# Patient Record
Sex: Female | Born: 1960
Health system: Southern US, Community
[De-identification: ages and names within clinical notes are randomized; demographics above are authoritative.]

## PROBLEM LIST (undated history)

## (undated) DIAGNOSIS — L03119 Cellulitis of unspecified part of limb: Secondary | ICD-10-CM

## (undated) DIAGNOSIS — D649 Anemia, unspecified: Secondary | ICD-10-CM

## (undated) DIAGNOSIS — L02619 Cutaneous abscess of unspecified foot: Secondary | ICD-10-CM

## (undated) DIAGNOSIS — E1151 Type 2 diabetes mellitus with diabetic peripheral angiopathy without gangrene: Secondary | ICD-10-CM

## (undated) DIAGNOSIS — T7840XA Allergy, unspecified, initial encounter: Secondary | ICD-10-CM

## (undated) DIAGNOSIS — I10 Essential (primary) hypertension: Secondary | ICD-10-CM

## (undated) DIAGNOSIS — I739 Peripheral vascular disease, unspecified: Secondary | ICD-10-CM

## (undated) DIAGNOSIS — F419 Anxiety disorder, unspecified: Secondary | ICD-10-CM

## (undated) DIAGNOSIS — R7611 Nonspecific reaction to tuberculin skin test without active tuberculosis: Secondary | ICD-10-CM

## (undated) HISTORY — DX: Anxiety disorder, unspecified: F41.9

## (undated) HISTORY — DX: Allergy, unspecified, initial encounter: T78.40XA

## (undated) HISTORY — DX: Anemia, unspecified: D64.9

## (undated) HISTORY — DX: Essential (primary) hypertension: I10

---

## 2009-11-16 ENCOUNTER — Encounter: Admission: RE | Admit: 2009-11-16 | Discharge: 2009-11-16 | Payer: Self-pay | Admitting: Infectious Diseases

## 2009-12-04 ENCOUNTER — Emergency Department (HOSPITAL_COMMUNITY): Admission: EM | Admit: 2009-12-04 | Discharge: 2009-12-04 | Payer: Self-pay | Admitting: Emergency Medicine

## 2009-12-09 ENCOUNTER — Emergency Department (HOSPITAL_COMMUNITY): Admission: EM | Admit: 2009-12-09 | Discharge: 2009-12-09 | Payer: Self-pay | Admitting: Emergency Medicine

## 2009-12-09 ENCOUNTER — Ambulatory Visit: Payer: Self-pay | Admitting: Family Medicine

## 2009-12-09 ENCOUNTER — Encounter: Payer: Self-pay | Admitting: Family Medicine

## 2009-12-09 DIAGNOSIS — I10 Essential (primary) hypertension: Secondary | ICD-10-CM

## 2009-12-09 DIAGNOSIS — R0789 Other chest pain: Secondary | ICD-10-CM | POA: Insufficient documentation

## 2010-01-08 ENCOUNTER — Emergency Department (HOSPITAL_COMMUNITY): Admission: EM | Admit: 2010-01-08 | Discharge: 2010-01-08 | Payer: Self-pay | Admitting: Emergency Medicine

## 2010-02-04 ENCOUNTER — Encounter
Admission: RE | Admit: 2010-02-04 | Discharge: 2010-02-04 | Payer: Self-pay | Source: Home / Self Care | Attending: Infectious Diseases | Admitting: Infectious Diseases

## 2010-03-23 NOTE — Assessment & Plan Note (Signed)
Summary: NP,tcb   Vital Signs:  Patient profile:   50 year old female Height:      61 inches Weight:      141.44 pounds BMI:     26.82 BSA:     1.63 Temp:     98.6 degrees F Pulse rate:   79 / minute BP sitting:   201 / 94  Vitals Entered By: Christen Bame CMA (December 09, 2009 2:52 PM) CC: NP Is Patient Diabetic? No Pain Assessment Patient in pain? no        CC:  NP.  History of Present Illness: this is a new pt.  she is arabic and presents with complaint of headache and chest pain.   pt has a history of htn previously treated with nifedipine, hctz, and aldomet.  however, she is a recent immigrant and has been off of her medication for the past 3 months.  bp today 200/98.  she also complains of a headache but denies any vision changes or paresthesias.  she also complains of midsternal nonradiating pressure like chest pain, which she states she has had for 2 years.  she states it is better with food.   Habits & Providers  Alcohol-Tobacco-Diet     Tobacco Status: never  Past History:  PSH neg.  FH Neg SHx: married, denies all All none OBHx: G3P2010-TSVD x2, neonatal demise at 23 wks, infant death at 75 mo., SAB x1  Past Medical History: ? DM per pt report Hypertension  Social History: Smoking Status:  never  Physical Exam  General:  Well-developed,well-nourished,in no acute distress; alert,appropriate and cooperative throughout examination Head:  Normocephalic and atraumatic without obvious abnormalities. No apparent alopecia or balding. Eyes:  No corneal or conjunctival inflammation noted. EOMI. Perrla. Funduscopic exam benign, without hemorrhages, exudates or papilledema. Vision grossly normal. Ears:  External ear exam shows no significant lesions or deformities.  Otoscopic examination reveals clear canals, tympanic membranes are intact bilaterally without bulging, retraction, inflammation or discharge. Hearing is grossly normal bilaterally. Neck:  No  deformities, masses, or tenderness noted. mild left carotid bruit Lungs:  Normal respiratory effort, chest expands symmetrically. Lungs are clear to auscultation, no crackles or wheezes. Heart:  Normal rate and regular rhythm. S1 and S2 normal without gallop, click, rub [id number] SEM RUSB Abdomen:  Bowel sounds positive,abdomen soft and non-tender without masses, organomegaly or hernias noted. no abdominal/renal bruits on auscultation Additional Exam:  EKG with inverted T waves V3-V6 and II and AVF.     Impression & Recommendations:  Problem # 1:  HYPERTENSION (ICD-401.9) Assessment Deteriorated  Orders: EKG- [place] (EKG) Pt with hypertensive emergency since with elevated Blood pressure and symptoms.  pt transported by EMS to ER for further evaluation.   Problem # 2:  CHEST PAIN UNSPECIFIED (ICD-786.50) Assessment: Deteriorated  Orders: ASA 325mg  tab [facility]) Due to the EKG changes and Hypertension.  pt given ASA and oxygen for possible ACS.  pt transported to emergency department for further evaluation and r/o MI.    Medication Administration  Medication # 1:    Medication: ASA 325mg  tab    Diagnosis: CHEST PAIN UNSPECIFIED (ICD-786.50)    Dose: 1 tablet    Route: po    Exp Date: 03/17/2010    Lot #: [id number]    Mfr: major    Patient tolerated medication without complications    Given by: Christen Bame CMA (December 09, 2009 4:18 PM)  Orders Added: 1)  EKG- [facility] [EKG] 2)  ASA 325mg  tab [EMRORAL]  3)  [facility]- [facility] [99204]     Medication Administration  Medication # 1:    Medication: ASA 325mg  tab    Diagnosis: CHEST PAIN UNSPECIFIED (ICD-786.50)    Dose: 1 tablet    Route: po    Exp Date: 03/17/2010    Lot #: [id number]    Mfr: major    Patient tolerated medication without complications    Given by: Christen Bame CMA (December 09, 2009 4:18 PM)  Orders Added: 1)  EKG- [facility] [EKG] 2)  ASA 325mg  tab [EMRORAL] 3)  [facility]- [facility] [99204]

## 2010-04-16 ENCOUNTER — Ambulatory Visit (INDEPENDENT_AMBULATORY_CARE_PROVIDER_SITE_OTHER): Payer: Medicaid Other

## 2010-04-16 ENCOUNTER — Inpatient Hospital Stay (INDEPENDENT_AMBULATORY_CARE_PROVIDER_SITE_OTHER)
Admission: RE | Admit: 2010-04-16 | Discharge: 2010-04-16 | Disposition: A | Discharge status: Home / Self Care | Payer: Medicaid Other | Source: Ambulatory Visit | Attending: Family Medicine | Admitting: Family Medicine

## 2010-04-16 DIAGNOSIS — M542 Cervicalgia: Secondary | ICD-10-CM

## 2010-05-04 ENCOUNTER — Emergency Department (HOSPITAL_COMMUNITY): Payer: Self-pay

## 2010-05-04 ENCOUNTER — Observation Stay (HOSPITAL_COMMUNITY)
Admission: EM | Admit: 2010-05-04 | Discharge: 2010-05-07 | DRG: 313 | Disposition: A | Discharge status: Home / Self Care | Payer: Self-pay | Attending: Family Medicine | Admitting: Family Medicine

## 2010-05-04 DIAGNOSIS — K219 Gastro-esophageal reflux disease without esophagitis: Secondary | ICD-10-CM | POA: Insufficient documentation

## 2010-05-04 DIAGNOSIS — Z8611 Personal history of tuberculosis: Secondary | ICD-10-CM | POA: Insufficient documentation

## 2010-05-04 DIAGNOSIS — R51 Headache: Secondary | ICD-10-CM | POA: Insufficient documentation

## 2010-05-04 DIAGNOSIS — R079 Chest pain, unspecified: Secondary | ICD-10-CM | POA: Insufficient documentation

## 2010-05-04 DIAGNOSIS — I10 Essential (primary) hypertension: Secondary | ICD-10-CM | POA: Insufficient documentation

## 2010-05-04 DIAGNOSIS — R61 Generalized hyperhidrosis: Secondary | ICD-10-CM | POA: Insufficient documentation

## 2010-05-04 DIAGNOSIS — E119 Type 2 diabetes mellitus without complications: Principal | ICD-10-CM | POA: Insufficient documentation

## 2010-05-04 LAB — CBC
HCT: 37.2 % (ref 36.0–46.0)
MCH: 26.7 pg (ref 26.0–34.0)
MCH: 27.6 pg (ref 26.0–34.0)
MCHC: 34.3 g/dL (ref 30.0–36.0)
MCHC: 33.8 g/dL (ref 30.0–36.0)
MCV: 81.6 fL (ref 78.0–100.0)
Platelets: 211 10*3/uL (ref 150–400)
RDW: 13 % (ref 11.5–15.5)
RDW: 13.5 % (ref 11.5–15.5)
WBC: 5.9 10*3/uL (ref 4.0–10.5)

## 2010-05-04 LAB — COMPREHENSIVE METABOLIC PANEL
ALT: 25 U/L (ref 0–35)
AST: 25 U/L (ref 0–37)
Albumin: 3.6 g/dL (ref 3.5–5.2)
Alkaline Phosphatase: 67 U/L (ref 39–117)
BUN: 8 mg/dL (ref 6–23)
CO2: 22 mEq/L (ref 19–32)
Calcium: 8.6 mg/dL (ref 8.4–10.5)
Chloride: 99 mEq/L (ref 96–112)
Creatinine, Ser: 0.68 mg/dL (ref 0.4–1.2)
Creatinine, Ser: 0.66 mg/dL (ref 0.4–1.2)
GFR calc non Af Amer: >60 mL/min (ref >60)
Glucose, Bld: 468 mg/dL — ABNORMAL HIGH (ref 70–99)
Glucose, Bld: 260 mg/dL — ABNORMAL HIGH (ref 70–99)
Potassium: 3.3 mEq/L — ABNORMAL LOW (ref 3.5–5.1)
Sodium: 133 mEq/L — ABNORMAL LOW (ref 135–145)
Total Bilirubin: 0.4 mg/dL (ref 0.3–1.2)
Total Protein: 7.7 g/dL (ref 6.0–8.3)

## 2010-05-04 LAB — URINE CULTURE
Colony Count: NO GROWTH
Culture  Setup Time: 201111180815

## 2010-05-04 LAB — URINALYSIS, ROUTINE W REFLEX MICROSCOPIC
Bilirubin Urine: NEGATIVE
Glucose, UA: 250 mg/dL — AB
Ketones, ur: NEGATIVE mg/dL
Protein, ur: NEGATIVE mg/dL
pH: 6 (ref 5.0–8.0)

## 2010-05-04 LAB — DIFFERENTIAL
Basophils Absolute: 0 10*3/uL (ref 0.0–0.1)
Basophils Absolute: 0 10*3/uL (ref 0.0–0.1)
Basophils Relative: 0 % (ref 0–1)
Basophils Relative: 0 % (ref 0–1)
Eosinophils Relative: 1 % (ref 0–5)
Lymphocytes Relative: 21 % (ref 12–46)
Monocytes Absolute: 0.4 10*3/uL (ref 0.1–1.0)
Monocytes Absolute: 0.3 10*3/uL (ref 0.1–1.0)
Monocytes Relative: 5 % (ref 3–12)
Monocytes Relative: 5 % (ref 3–12)
Neutro Abs: 4.2 10*3/uL (ref 1.7–7.7)
Neutrophils Relative %: 72 % (ref 43–77)

## 2010-05-04 LAB — POCT CARDIAC MARKERS
Myoglobin, poc: 56.1 ng/mL (ref 12–200)
Troponin i, poc: <0.05 ng/mL (ref 0.00–0.09)

## 2010-05-04 LAB — LIPASE, BLOOD
Lipase: 39 U/L (ref 11–59)
Lipase: 33 U/L (ref 11–59)

## 2010-05-04 LAB — URINE MICROSCOPIC-ADD ON

## 2010-05-05 ENCOUNTER — Encounter: Payer: Self-pay | Admitting: Family Medicine

## 2010-05-05 DIAGNOSIS — I10 Essential (primary) hypertension: Secondary | ICD-10-CM

## 2010-05-05 DIAGNOSIS — E118 Type 2 diabetes mellitus with unspecified complications: Secondary | ICD-10-CM

## 2010-05-05 LAB — DIFFERENTIAL
Basophils Absolute: 0 10*3/uL (ref 0.0–0.1)
Eosinophils Relative: 1 % (ref 0–5)
Lymphocytes Relative: 59 % — ABNORMAL HIGH (ref 12–46)
Lymphs Abs: 4.2 10*3/uL — ABNORMAL HIGH (ref 0.7–4.0)
Neutro Abs: 2.4 10*3/uL (ref 1.7–7.7)
Neutrophils Relative %: 34 % — ABNORMAL LOW (ref 43–77)

## 2010-05-05 LAB — POCT I-STAT, CHEM 8
BUN: 5 mg/dL — ABNORMAL LOW (ref 6–23)
Creatinine, Ser: 0.5 mg/dL (ref 0.4–1.2)
Glucose, Bld: 410 mg/dL — ABNORMAL HIGH (ref 70–99)
Hemoglobin: 13.6 g/dL (ref 12.0–15.0)
Sodium: 136 mEq/L (ref 135–145)
TCO2: 24 mmol/L (ref 0–100)

## 2010-05-05 LAB — GLUCOSE, CAPILLARY
Glucose-Capillary: 292 mg/dL — ABNORMAL HIGH (ref 70–99)
Glucose-Capillary: 284 mg/dL — ABNORMAL HIGH (ref 70–99)
Glucose-Capillary: 170 mg/dL — ABNORMAL HIGH (ref 70–99)
Glucose-Capillary: 188 mg/dL — ABNORMAL HIGH (ref 70–99)
Glucose-Capillary: 369 mg/dL — ABNORMAL HIGH (ref 70–99)

## 2010-05-05 LAB — POCT CARDIAC MARKERS
CKMB, poc: <1 ng/mL — ABNORMAL LOW (ref 1.0–8.0)
Myoglobin, poc: 47.5 ng/mL (ref 12–200)

## 2010-05-05 LAB — BASIC METABOLIC PANEL
BUN: 6 mg/dL (ref 6–23)
CO2: 25 mEq/L (ref 19–32)
Chloride: 102 mEq/L (ref 96–112)
GFR calc non Af Amer: >60 mL/min (ref >60)
Glucose, Bld: 223 mg/dL — ABNORMAL HIGH (ref 70–99)
Potassium: 3 mEq/L — ABNORMAL LOW (ref 3.5–5.1)
Sodium: 136 mEq/L (ref 135–145)

## 2010-05-05 LAB — CBC
HCT: 36.8 % (ref 36.0–46.0)
Hemoglobin: 12.1 g/dL (ref 12.0–15.0)
MCV: 78.3 fL (ref 78.0–100.0)
RBC: 4.7 MIL/uL (ref 3.87–5.11)
WBC: 7.1 10*3/uL (ref 4.0–10.5)

## 2010-05-05 LAB — CARDIAC PANEL(CRET KIN+CKTOT+MB+TROPI)
CK, MB: 2.7 ng/mL (ref 0.3–4.0)
Relative Index: 2 (ref 0.0–2.5)

## 2010-05-05 LAB — CK TOTAL AND CKMB (NOT AT ARMC): Relative Index: 2.2 (ref 0.0–2.5)

## 2010-05-05 LAB — TROPONIN I: Troponin I: 0.02 ng/mL (ref 0.00–0.06)

## 2010-05-05 NOTE — H&P (Signed)
[facility] Admission History and Physical  Patient name: Kimberly Lane Medical record number: [id number] Date of birth: 10-09-1960 Age: 50 y.o. Gender: female  Primary Care Provider: CAMPBELL,LACHELLE, MD  Chief Complaint: Chest pain, hyperglycemia History of Present Illness: Kimberly Lane is a 50 y.o. year old female presenting with h/a, dizziness, and "just not feeling well" x 1 day. Pt had bp checked at local community center and it was elevated.  Pt came to ER for evaluation for this reason.  Pt also reports Chest pain x 3 years that is worse with ambulation for long distances (such as 1/2 mile).  No sob with short distances.   Sometimes CP occurs after eating a meal.  The CP is located over the sternum.  It is worse with palpation of area.  Pt also endorses increased thirst.  No urinary or bowel complaints.  No fever.  No cold symptoms. All information obtained via arabic interpreter that is at bedside.   Patient Active Problem List  Diagnoses  . HYPERTENSION  . CHEST PAIN UNSPECIFIED   Past Medical History: HTN, TB  Past Surgical History: No past surgical history on file.  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  Pt speaks arabic. Husband works 3 hours away at [employer] so pt is alone often.  Pt denies tobacco, alcohol, or drug use  Family History: negative  Allergies: NKDA  Medications: Pt is taking vit B6. Unsure if pt has been treated for TB.  No current outpatient prescriptions on file.   Review Of Systems: Per HPI  Otherwise 12 point review of systems was performed and was unremarkable.  Physical Exam: Pulse: 69  Blood Pressure: 185/102 RR: 18   O2: 94% on RA Temp: 97.5  General: NAD, resting quietly HEENT: No lymphadenopathy, mild thyromegaly, some diffuse tenderness on palp of thyroid Heart: RRR, no murmur, rub or gallops, NSR on monitor, + tenderness  on palpation of central chest. Lungs: CTA bilateral, no wheezing bilateral Abdomen:soft, NT, ND, no rebound, no guarding Extremities: no edema, 2+ pedal pulses Skin: no rash Neurology: a/o x 3, following all commands  Labs and Imaging: Lab Results  Component Value Date/Time   NA 133* 05/04/2010  5:35 PM   K 3.8 05/04/2010  5:35 PM   CL 99 05/04/2010  5:35 PM   CO2 22 05/04/2010  5:35 PM   BUN 9 05/04/2010  5:35 PM   CREATININE 0.68 05/04/2010  5:35 PM   GLUCOSE 468* 05/04/2010  5:35 PM   Lab Results  Component Value Date   WBC 7.0 05/04/2010   HGB 13.2 05/04/2010   HCT 38.5 05/04/2010   MCV 77.8* 05/04/2010   PLT 244 05/04/2010   Lab Results  Component Value Date   CKTOTAL 140 05/05/2010   TROPONINI  Value: 0.02        NO INDICATION OF MYOCARDIAL INJURY. 05/05/2010    CXR-  No evidence of active pulmonary disease.   Assessment and Plan: Kimberly Lane is a 50 y.o. year old female presenting with Chest pain and hyperglycemia: 1. Chest pain- may be 2/2 to reflux.  Will treat with GI cocktail to see if symptoms improve.  Will also cycle cardiac enzymes and repeat ekg in am to r/o ischemic event that would cause chest pain. This is a chronic problem (x 3 years) so an acute mi is less likely.  May need to  be on PPI chronically to see if this helps symptoms. Not sure why pt has sob with walking 1/2 mile or greater--if all the above plan is normal, sob may be 2/2 deconditioning.  2. HTN- pt not taking any bp medications.  Discussed with pt that she most likely will always need to be taking a bp medicaton.  Started lisinopril since pt is new dx of diabetes and since pt Cr is wnl.  3. Hyperglycemia- most likely indicates a diagnosis of diabetes.  Will check A1C.  Will monitor CBG's.  Will cover with sliding scale insulin and will add scheduled insulin if there is a insulin requirement.  Pt will need to be set up with diabetes education as outpatient.  4. Thyromegaly- very subtle on exam.  Will order  TSH since enlarged and pt had diffuse tenderness in the area on exam.   5. FEN/GI: carb modified diet 6. Prophylaxis: heparin 5000 units TID 7. Disposition: pending clinical improvement.   Dictation # --[id number]

## 2010-05-06 LAB — CBC
HCT: 38.8 % (ref 36.0–46.0)
MCH: 26.7 pg (ref 26.0–34.0)
MCHC: 34.2 g/dL (ref 30.0–36.0)
MCV: 79 fL (ref 78.0–100.0)
Platelets: 221 10*3/uL (ref 150–400)
RDW: 12.7 % (ref 11.5–15.5)
RDW: 13.3 % (ref 11.5–15.5)
WBC: 7.1 10*3/uL (ref 4.0–10.5)
WBC: 5.9 10*3/uL (ref 4.0–10.5)

## 2010-05-06 LAB — GLUCOSE, CAPILLARY
Glucose-Capillary: 350 mg/dL — ABNORMAL HIGH (ref 70–99)
Glucose-Capillary: 267 mg/dL — ABNORMAL HIGH (ref 70–99)
Glucose-Capillary: 136 mg/dL — ABNORMAL HIGH (ref 70–99)

## 2010-05-06 LAB — BASIC METABOLIC PANEL
BUN: 5 mg/dL — ABNORMAL LOW (ref 6–23)
BUN: 6 mg/dL (ref 6–23)
CO2: 23 mEq/L (ref 19–32)
Calcium: 9.2 mg/dL (ref 8.4–10.5)
Chloride: 104 mEq/L (ref 96–112)
GFR calc non Af Amer: >60 mL/min (ref >60)
Glucose, Bld: 333 mg/dL — ABNORMAL HIGH (ref 70–99)
Glucose, Bld: 290 mg/dL — ABNORMAL HIGH (ref 70–99)
Potassium: 4 mEq/L (ref 3.5–5.1)

## 2010-05-06 LAB — URINALYSIS, ROUTINE W REFLEX MICROSCOPIC
Bilirubin Urine: NEGATIVE
Hgb urine dipstick: NEGATIVE
Ketones, ur: 15 mg/dL — AB
Nitrite: NEGATIVE
Protein, ur: NEGATIVE mg/dL
Urobilinogen, UA: 0.2 mg/dL (ref 0.0–1.0)

## 2010-05-07 LAB — CBC
HCT: 37.7 % (ref 36.0–46.0)
Hemoglobin: 12.5 g/dL (ref 12.0–15.0)
MCH: 26.1 pg (ref 26.0–34.0)
MCHC: 33.2 g/dL (ref 30.0–36.0)

## 2010-05-07 LAB — COMPREHENSIVE METABOLIC PANEL
BUN: 5 mg/dL — ABNORMAL LOW (ref 6–23)
CO2: 26 mEq/L (ref 19–32)
Chloride: 106 mEq/L (ref 96–112)
Creatinine, Ser: 0.62 mg/dL (ref 0.4–1.2)
GFR calc non Af Amer: >60 mL/min (ref >60)
Total Bilirubin: 0.4 mg/dL (ref 0.3–1.2)

## 2010-05-07 LAB — GLUCOSE, CAPILLARY
Glucose-Capillary: 178 mg/dL — ABNORMAL HIGH (ref 70–99)
Glucose-Capillary: 192 mg/dL — ABNORMAL HIGH (ref 70–99)

## 2010-05-17 ENCOUNTER — Encounter: Payer: Self-pay | Admitting: Family Medicine

## 2010-05-17 ENCOUNTER — Ambulatory Visit (INDEPENDENT_AMBULATORY_CARE_PROVIDER_SITE_OTHER): Payer: Self-pay | Admitting: Family Medicine

## 2010-05-17 DIAGNOSIS — Z23 Encounter for immunization: Secondary | ICD-10-CM

## 2010-05-17 DIAGNOSIS — E1165 Type 2 diabetes mellitus with hyperglycemia: Secondary | ICD-10-CM

## 2010-05-17 DIAGNOSIS — I10 Essential (primary) hypertension: Secondary | ICD-10-CM

## 2010-05-17 MED ORDER — PNEUMOCOCCAL VAC POLYVALENT 25 MCG/0.5ML IJ INJ: 0.5000 mL | INJECTION | Freq: Once | INTRAMUSCULAR | Status: DC

## 2010-05-17 MED ORDER — LISINOPRIL 20 MG PO TABS: 40.0000 mg | ORAL_TABLET | Freq: Every day | ORAL | Status: DC

## 2010-05-17 NOTE — Patient Instructions (Signed)
Diabetes Meal Planning Guide The diabetes meal planning guide is a tool to help you plan your meals and snacks. It is important for people with diabetes to manage their blood sugar levels. Choosing the right foods and the right amounts throughout your day will help control your blood sugar. Eating right can even help you improve your blood pressure and reach or maintain a healthy weight. CARBOHYDRATE COUNTING MADE EASY When you eat carbohydrates, they turn to sugar (glucose). This raises your blood sugar level. Counting carbohydrates can help you control this level so you feel better. When you plan your meals by counting carbohydrates, you can have more flexibility in what you eat and balance your medicine with your food intake. Carbohydrate counting simply means adding up the total amount of carbohydrate grams (g) in your meals or snacks. Try to eat about the same amount at each meal. Foods with carbohydrates are listed below. Each portion below is 1 carbohydrate serving or 15 grams of carbohydrates. Ask your dietician how many grams of carbohydrates you should eat at each meal or snack. Grains and Starches 1 slice bread 1/2 English muffin or hotdog/hamburger bun 3/4 cup cold cereal (unsweetened) 1/3 cup cooked pasta or rice 1/2 cup starchy vegetables (corn, potatoes, peas, beans, winter squash) 1 tortilla (6 inches) 1/4 bagel 1 waffle or pancake (size of a CD) 1/2 cup cooked cereal 4 to 6 small crackers *Whole grain is recommended Fruit 1 cup fresh unsweetened berries, melon, papaya, pineapple 1 small fresh fruit 1/2 banana or mango 1/2 cup fruit juice (4 ounces unsweetened) 1/2 cup canned fruit in natural juice or water 2 tablespoons dried fruit 12 to 15 grapes or cherries Milk and Yogurt 1 cup fat-free or 1% milk 1 cup soy milk 6 ounces light yogurt with sugar-free sweetener 6 ounces low-fat soy yogurt 6 ounces plain yogurt Vegetables 1 cup raw or 1/2 cup cooked is counted as 0  carbohydrates or a "free" food. If you eat 3 or more servings at one meal, count them as 1 carbohydrate serving. Other Carbohydrates 3/4 ounces chips or pretzels 1/2 cup ice cream or frozen yogurt 1/4 cup sherbet or sorbet 2 inch square cake, no frosting 1 tablespoon honey, sugar, jam, jelly, or syrup 2 small cookies 3 squares of graham crackers 3 cups popcorn 6 crackers 1 cup broth-based soup Count 1 cup casserole or other mixed foods as 2 carbohydrate servings. Foods with less than 20 calories in a serving may be counted as 0 carbohydrates or a "free" food. You may want to purchase a book or computer software that lists the carbohydrate gram counts of different foods. In addition, the nutrition facts panel on the labels of the foods you eat are a good source of this information. The label will tell you how big the serving size is and the total number of carbohydrate grams you will be eating per serving. Divide this number by 15 to obtain the number of carbohydrate servings in a portion. Remember: 1 carbohydrate serving equals 15 grams of carbohydrate. SERVING SIZES Measuring foods and serving sizes helps you make sure you are getting the right amount of food. The list below tells how big or small some common serving sizes are.  1 ounce (oz) of cheese.................................4 stacked dice.   2 to 3 oz cooked meat..................................Deck of cards.   1 teaspoon (tsp)............................................Tip of little finger.   1 tablespoon (tbs).........................................Thumb.   2 tbs.............................................................Golf ball.    cup...........................................................Half of a fist.   1 cup............................................................A fist.    SAMPLE DIABETES MEAL PLAN Below is a sample meal plan that includes foods from the grain and starches, dairy, vegetable, fruit, and  meat groups. A dietician can individualize a meal plan to fit your calorie needs and tell you the number of servings needed from each food group. However, controlling the total amount of carbohydrates in your meal or snack is more important than making sure you include all of the food groups at every meal. You may interchange carbohydrate containing foods (dairy, starches, and fruits). The meal plan below is an example of a 2000 calorie diet using carbohydrate counting. This meal plan has 17 carbohydrate servings (carb choices). Breakfast 1 cup oatmeal (2 carb choices) 3/4 cup light yogurt (1 carb choice) 1 cup blueberries (1 carb choice) 1/4 cup almonds  Snack 1 large apple (2 carb choices) 1 low-fat string cheese stick  Lunch Chicken breast salad:  1 cup spinach   1/4 cup chopped tomatoes   2 oz chicken breast, sliced   2 tbs low-fat [place] dressing  12 whole-wheat crackers (2 carb choices) 12 to 15 grapes (1 carb choice) 1 cup low-fat milk (1 carb choice)  Snack 1 cup carrots 1/2 cup hummus (1 carb choice)  Dinner 3 oz broiled salmon 1 cup brown rice (3 carb choices)  Snack 1 1/2 cups steamed broccoli (1 carb choice) drizzled with 1 tsp olive oil and lemon juice 1 cup light pudding (2 carb choices)  DIABETES MEAL PLANNING WORKSHEET Your dietician can use this worksheet to help you decide how many servings of foods and what types of foods are right for you.  Breakfast Food Group and Servings Carb Choices Grain/Starches _______________________________________ Dairy ______________________________________________ Vegetable _______________________________________ Fruit _______________________________________________ Meat _______________________________________________ Fat _____________________________________________ Lunch Food Group and Servings Carb Choices Grain/Starches ________________________________________ Dairy _______________________________________________ Fruit  ________________________________________________ Meat ________________________________________________ Fat _____________________________________________ Dinner Food Group and Servings Carb Choices Grain/Starches ________________________________________ Dairy _______________________________________________ Fruit ________________________________________________ Meat ________________________________________________ Fat _____________________________________________ Snacks Food Group and Servings Carb Choices Grain/Starches ________________________________________ Dairy _______________________________________________ Vegetable ________________________________________ Fruit ________________________________________________ Meat ________________________________________________ Fat _____________________________________________ Daily Totals Starches _________________________ Vegetable __________________________ Fruit ______________________________ Dairy ______________________________ Meat ______________________________ Fat ________________________________  Document Released: 11/04/2004 Document Re-Released: 07/28/2009 Exit[employer] Patient Information 2011 [place].  It was a pleasure to care for you today.  Please make a follow up appointment in 1-2 weeks for follow up of your blood pressure and diabetes.  Please make an appointment within one month for well woman examination.  Continue to check your sugars in the morning before breakfast, and 2 hours after meals.

## 2010-05-17 NOTE — Progress Notes (Signed)
Subjective:    Kimberly Lane is here for follow-up of her hypertension, newly diagnosed DM and recent hospitalization.  Her high blood pressure was first noted at a hospitalization 2 weeks ago. Home blood pressure readings: not doing. Salt intake and diet: doing diabetic and CV diet. . Usual weight: unk. Associated signs and symptoms: headache and but she states that her headache is chronic in nature. . Patient denies: chest pain, dyspnea, palpitations and peripheral edema. Use of agents associated with hypertension: none. Medication compliance: taking as prescribed.  Regarding her DM, she did not bring her log book or glucometer.  However, she states she occasionally gets highs of 270 and states fasting are 170 which are an improvement from recent elevations of 300.  She denies any hypoglycemia and is currently compliant with her medication.  However, she has not seen a nutritionist yet for diet education.  She states she needs refills on her syringes.  She has not received her pneumovax either.    Regarding well adult care, she denies any recent pap or mammogram.   The following portions of the patient's history were reviewed and updated as appropriate: allergies, current medications, past family history, past medical history, past social history, past surgical history and problem list.   Review of Systems Pertinent items are noted in HPI.     Objective:    Physical Exam: BP 163/78  Pulse 67  Temp(Src) 97.2 F (36.2 C) (Oral)  Ht 5' 1.6" (1.565 m)  Wt 143 lb (64.864 kg)  BMI 26.50 kg/m2  General Appearance:    Alert, cooperative, no distress, appears stated age  Head:    Normocephalic, without obvious abnormality, atraumatic  Eyes:    PERRL, conjunctiva/corneas clear, EOM's intact, fundi    benign, both eyes  Ears:    Normal TM's and external ear canals, both ears  Nose:   Nares normal, septum midline, mucosa normal, no drainage    or sinus tenderness  Throat:   Lips, mucosa, and tongue  normal; teeth and gums normal  Neck:   Supple, symmetrical, trachea midline, no adenopathy;    thyroid:  no enlargement/tenderness/nodules; no carotid   bruit or JVD  Back:     Symmetric, no curvature, ROM normal, no CVA tenderness  Lungs:     Clear to auscultation bilaterally, respirations unlabored  Chest Wall:    No tenderness or deformity   Heart:    Regular rate and rhythm, S1 and S2 normal, no murmur, rub   or gallop  Breast Exam:    No tenderness, masses, or nipple abnormality  Abdomen:     Soft, non-tender, bowel sounds active all four quadrants,    no masses, no organomegaly  Genitalia:    Normal female without lesion, discharge or tenderness  Rectal:    Normal tone, normal prostate, no masses or tenderness;   guaiac negative stool  Extremities:   Extremities normal, atraumatic, no cyanosis or edema  Pulses:   2+ and symmetric all extremities  Skin:   Skin color, texture, turgor normal, no rashes or lesions  Lymph nodes:   Cervical, supraclavicular, and axillary nodes normal  Neurologic:   CNII-XII intact, normal strength, sensation and reflexes    throughout    Lab Review  Admission on 05/04/2010, Discharged on 05/07/2010  Component Date Value  . Glucose-Capillary 05/04/2010 490*  . Neutrophils Relative 05/04/2010 48   . Neutro Abs 05/04/2010 3.4   . Lymphocytes Relative 05/04/2010 46   . Lymphs Abs 05/04/2010 3.2   .  Monocytes Relative 05/04/2010 5   . Monocytes Absolute 05/04/2010 0.4   . Eosinophils Relative 05/04/2010 1   . Eosinophils Absolute 05/04/2010 0.1   . Basophils Relative 05/04/2010 0   . Basophils Absolute 05/04/2010 0.0   . WBC 05/04/2010 7.0   . RBC 05/04/2010 4.95   . Hemoglobin 05/04/2010 13.2   . Hematocrit 05/04/2010 38.5   . MCV 05/04/2010 77.8*  . [facility] 05/04/2010 26.7   . MCHC 05/04/2010 34.3   . RDW 05/04/2010 13.0   . Platelets 05/04/2010 244   . Myoglobin, poc 05/04/2010 56.1   . CKMB, poc 05/04/2010 2.1   . Troponin i, poc 05/04/2010  <0.05   . Comment 05/04/2010                     Value:                               TROPONIN VALUES IN THE RANGE                         OF 0.00-0.09 ng/mL SHOW                         NO INDICATION OF                         MYOCARDIAL INJURY.                                                        PERSISTENTLY INCREASED TROPONIN                         VALUES IN THE RANGE OF 0.10-0.24                         ng/mL CAN BE SEEN IN:                               -UNSTABLE ANGINA                               -CONGESTIVE HEART FAILURE                               -MYOCARDITIS                               -CHEST TRAUMA                               -ARRYHTHMIAS                               -LATE PRESENTING MI                               -COPD  CLINICAL FOLLOW-UP RECOMMENDED.                                                        TROPONIN VALUES >=0.25 ng/mL                         INDICATE POSSIBLE MYOCARDIAL                         ISCHEMIA. SERIAL TESTING                         RECOMMENDED.  [facility] Kitchen Sodium 05/04/2010 133*  . Potassium 05/04/2010 3.8   . Chloride 05/04/2010 99   . CO2 05/04/2010 22   . Glucose, Bld 05/04/2010 468*  . BUN 05/04/2010 9   . Creatinine, Ser 05/04/2010 0.68   . Calcium 05/04/2010 8.6   . Total Protein 05/04/2010 7.5   . Albumin 05/04/2010 3.4*  . AST 05/04/2010 25   . ALT 05/04/2010 25   . Alkaline Phosphatase 05/04/2010 81   . Total Bilirubin 05/04/2010 0.4   . GFR calc non Af Amer 05/04/2010 >60   . GFR calc Af Amer 05/04/2010                     Value:>60                                The eGFR has been calculated                         using the MDRD equation.                         This calculation has not been                         validated in all clinical                         situations.                         eGFR's persistently                         <60 mL/min signify                          possible Chronic Kidney Disease.  . Lipase 05/04/2010 39   . Glucose-Capillary 05/04/2010 388*  . Glucose-Capillary 05/04/2010 267*  . Total CK 05/05/2010 140   . CK, MB 05/05/2010 3.1   . Relative Index 05/05/2010 2.2   . Troponin I 05/05/2010                     Value:0.02                                NO INDICATION OF  MYOCARDIAL INJURY.  . Neutrophils Relative 05/05/2010 34*  . Neutro Abs 05/05/2010 2.4   . Lymphocytes Relative 05/05/2010 59*  . Lymphs Abs 05/05/2010 4.2*  . Monocytes Relative 05/05/2010 6   . Monocytes Absolute 05/05/2010 0.4   . Eosinophils Relative 05/05/2010 1   . Eosinophils Absolute 05/05/2010 0.1   . Basophils Relative 05/05/2010 0   . Basophils Absolute 05/05/2010 0.0   . WBC 05/05/2010 7.1   . RBC 05/05/2010 4.70   . Hemoglobin 05/05/2010 12.1   . Hematocrit 05/05/2010 36.8   . MCV 05/05/2010 78.3   . MCH 05/05/2010 25.7*  . MCHC 05/05/2010 32.9   . RDW 05/05/2010 13.2   . Platelets 05/05/2010 260   . Sodium 05/05/2010 136   . Potassium 05/05/2010 3.0 DELTA CHECK NOTED*  . Chloride 05/05/2010 102   . CO2 05/05/2010 25   . Glucose, Bld 05/05/2010 223*  . BUN 05/05/2010 6   . Creatinine, Ser 05/05/2010 0.52   . Calcium 05/05/2010 8.0*  . GFR calc non Af Amer 05/05/2010 >60   . GFR calc Af Amer 05/05/2010                     Value:>60                                The eGFR has been calculated                         using the MDRD equation.                         This calculation has not been                         validated in all clinical                         situations.                         eGFR's persistently                         <60 mL/min signify                         possible Chronic Kidney Disease.  . Total CK 05/05/2010 132   . CK, MB 05/05/2010 2.7   . Troponin I 05/05/2010                     Value:0.03                                NO INDICATION OF                         MYOCARDIAL  INJURY.  . Relative Index 05/05/2010 2.0   . Glucose-Capillary 05/05/2010 292*  . TSH 05/05/2010 3.639   . Glucose-Capillary 05/05/2010 284*  . Comment 1 05/05/2010 Notify RN   . Hemoglobin A1C 05/05/2010 *                   Value:15.9                         (  NOTE)                                                                       According to the ADA Clinical Practice Recommendations for 2011, when HbA1c is used as a screening test:   >=6.5%   Diagnostic of Diabetes Mellitus           (if abnormal result                          is confirmed)  5.7-6.4%   Increased risk of developing Diabetes Mellitus  References:Diagnosis and Classification of Diabetes Mellitus,Diabetes [id number] 1):S62-S69 and Standards of Medical Care in         Diabetes - 2011,Diabetes [id number]                          (Suppl 1):S11-S61.  . Mean Plasma Glucose 05/05/2010 410*  . Glucose-Capillary 05/05/2010 188*  . Glucose-Capillary 05/05/2010 369*  . Glucose-Capillary 05/06/2010 267*  . Sodium 05/06/2010 134*  . Potassium 05/06/2010 4.0 NO VISIBLE HEMOLYSIS   . Chloride 05/06/2010 104   . CO2 05/06/2010 23   . Glucose, Bld 05/06/2010 290*  . BUN 05/06/2010 6   . Creatinine, Ser 05/06/2010 0.55   . Calcium 05/06/2010 8.6   . GFR calc non Af Amer 05/06/2010 >60   . GFR calc Af Amer 05/06/2010                     Value:>60                                The eGFR has been calculated                         using the MDRD equation.                         This calculation has not been                         validated in all clinical                         situations.                         eGFR's persistently                         <60 mL/min signify                         possible Chronic Kidney Disease.  . WBC 05/06/2010 5.9   . RBC 05/06/2010 4.91   . Hemoglobin 05/06/2010 13.1   . Hematocrit 05/06/2010 38.8   . MCV 05/06/2010 79.0   . [facility] 05/06/2010 26.7   . MCHC 05/06/2010 33.8   .  RDW 05/06/2010 13.3   . Platelets 05/06/2010 286   . Glucose-Capillary 05/06/2010 274*  .  Glucose-Capillary 05/06/2010 136*  . Comment 1 05/06/2010 Documented in Chart   . Comment 2 05/06/2010 Notify RN   . Glucose-Capillary 05/06/2010 149*  . Comment 1 05/06/2010 Documented in Chart   . Comment 2 05/06/2010 Notify RN   . WBC 05/07/2010 7.4   . RBC 05/07/2010 4.79   . Hemoglobin 05/07/2010 12.5   . Hematocrit 05/07/2010 37.7   . MCV 05/07/2010 78.7   . [facility] 05/07/2010 26.1   . MCHC 05/07/2010 33.2   . RDW 05/07/2010 13.4   . Platelets 05/07/2010 270   . Glucose-Capillary 05/07/2010 178*  . Sodium 05/07/2010 139   . Potassium 05/07/2010 3.5   . Chloride 05/07/2010 106   . CO2 05/07/2010 26   . Glucose, Bld 05/07/2010 227*  . BUN 05/07/2010 5*  . Creatinine, Ser 05/07/2010 0.62   . Calcium 05/07/2010 9.0   . Total Protein 05/07/2010 6.8   . Albumin 05/07/2010 3.0*  . AST 05/07/2010 25   . ALT 05/07/2010 21   . Alkaline Phosphatase 05/07/2010 66   . Total Bilirubin 05/07/2010 0.4   . GFR calc non Af Amer 05/07/2010 >60   . GFR calc Af Amer 05/07/2010                     Value:>60                                The eGFR has been calculated                         using the MDRD equation.                         This calculation has not been                         validated in all clinical                         situations.                         eGFR's persistently                         <60 mL/min signify                         possible Chronic Kidney Disease.  . Glucose-Capillary 05/07/2010 192*  . Comment 1 05/07/2010 Notify RN        Assessment:    Hypertension, uncontrolled . Evidence of target organ damage: none.  Newly diagnosed DM II, uncontrolled   Plan:   Continue diabetic regiment, referral to nutritionist, pneumovax today, refill syringes, continue accuchecks.  Medication: increase to 40 mg daily of lisinopril.  F/u in 1-2 weeks for blood pressure  and DM II F/u in 1 mo for WWE.

## 2010-05-25 NOTE — Discharge Summary (Signed)
Kimberly Lane, Kimberly Lane NO.:  [id number]  MEDICAL RECORD NO.:  [id number]           PATIENT TYPE:  I  LOCATION:  2010                         FACILITY:  [facility]  PHYSICIAN:  Dickie La, MD        DATE OF BIRTH:  23-Apr-1960  DATE OF ADMISSION:  05/04/2010 DATE OF DISCHARGE:  05/07/2010                              DISCHARGE SUMMARY   PRIMARY CARE PROVIDER:  Willaim Bane, MD at [facility].  DISCHARGE DIAGNOSES: 1. Hyperglycemia, type 2 diabetes mellitus. 2. Hypertension. 3. Gastroesophageal reflux. 4. History of tuberculosis.  DISCHARGE MEDICATIONS: 1. Amlodipine 10 mg 1 tablet by mouth daily. 2. Aspirin 81 mg 1 tablet by mouth daily. 3. Coreg 6.25 mg 1 tablet by mouth twice daily. 4. Diabetes starter kit use as directed. 5. Insulin NPH injection 15 units subcutaneously daily before     breakfast. 6. Insulin NPH 15 units subcutaneously daily at supper time. 7. Isoniazid 300 mg 1 tablet by mouth daily, take for 9 months. 8. Lisinopril 20 mg 1 tablet by mouth daily. 9. Metformin 500 mg 1 tablet by mouth twice daily. 10.Vitamin B6 100 mg by mouth daily.  CONSULTS:  None.  PROCEDURES:  Portable chest x-ray on May 04, 2010, showed no evidence of active pulmonary disease.  PERTINENT LABORATORIES:  Point-of-care cardiac enzymes were negative. Second set of troponin were also negative.  Finally, third set of cardiac enzymes were negative.  Lipase 39.  TSH 3.639.  Hemoglobin A1c 15.9.  CBC at discharge:  White count 7.4, hemoglobin 12.5, hematocrit 37.7, and platelet count 270.  CMET on discharge:  Sodium 139, potassium 3.5, chloride 106, CO2 of 26, BUN 5, creatinine 0.62, glucose 227.  CBG at discharge 178, 193.  BRIEF HOSPITAL COURSE:  This is a 50 year old female who is a [name] refugee who presented to the ED with headache, dizziness, and just complaints of not feeling well for 1 day.  The patient had a blood pressure check at a  local community center and it was elevated.  The patient also reports having chest pain for 3 years that was worse with ambulation long distances.  She denied any shortness of breath or any other associated symptoms.  The patient says that the chest pain sometimes occurs after evening meal.  The patient also endorsed increased thirst. ROS:  No fever, no urinary or bowel complaints.  No upper respiratory symptoms.  All of this information was obtained via friend who also serves as her aerobic interpreter at bedside.  1. Chronic chest pain:  Due to the patient's complaints of chest pain     in the ED, cardiac enzymes were ordered.  Troponins were negative     x3.  A chest x-ray was obtained, which showed no acute     cardiopulmonary findings.  On hospital day #2, the patient's chest     pain resolved after taking 1 g of Tylenol.  No further workup     necessary.  The patient also received a GI cocktail, which seemed     to improve her complaints of chest pain.  No further workup     necessary throughout the rest of the hospital course. 2. Hemoglobin A1c of 15.9.  The patient likely has never been     diagnosed with type 2 diabetes until now.  Initial CBGs were 292-     369.  The patient was then started on metformin 500 mg b.i.d. and     also insulin NPH 15 units b.i.d.  The patient's CBGs began to trend     down from 274 to 136 to 149 to 178 and finally 192.  Diabetes     education was continued throughout the patient's hospital course.     She received instructions on how to check blood sugars at home and     also received handouts in aerobic language.  The patient     voice understanding of her diabetic treatment plan.  She is to     follow up with an outpatient diabetic educator.  She is also to     follow up with her primary care physician, Dr. Megan Salon, for     further diabetic management.  The patient will be discharged on     metformin 500 mg b.i.d. and insulin NPH  15 units  subcutaneously     twice daily. 3. Hypertension.  The patient initially presented with blood pressures     as high as 194/99.  The patient was started on lisinopril 10 mg     p.o. daily, Norvasc 10 mg, and Coreg 3.125 mg twice daily.  Blood     pressure still continued to be elevated, so lisinopril was     increased to 20 mg and Coreg was increased to 6.125 mg.  The     patient was advised to eat a heart-healthy low-sodium diet.  Her     blood pressures began to trend down due to the new medications.  On     the day of discharge, the patient's blood pressure was 133/81.  We     will discharge the patient home on current regimen.  Primary care     provider to follow up the patient's new diagnosis of hypertension.     The only symptom the patient endorsed that could have been related     to her hypertension are headaches and dizziness, both of which     resolved on the day of discharge. 4. History of PPD.  Due to difficulty in language barrier, it was     decided by the primary team to start preventative therapy for TB.     It was told by her fellow refugees that she had a positive PPD test     and negative chest x-ray in February 2012.  Therefore, we started     the patient on isoniazid 300 mg daily for 9 months with     supplemental vitamin B6.  [facility] Department was     notified.  They are to investigate this case further and find out     if the patient has ever been seen by the health department.  I was     told that as long as the patient is not in active TB, they do not     need to follow up with the patient; however, that if the patient     needs to receive these medications free of charge, she will need to     have an appointment at health department.  Primary care provider to  follow up with her medications.  If the patient does need to     receive isoniazid and B6 from health department, a referral will be     necessary. 5. Thyromegaly.  TSH is within normal  limits.  No further workup     necessary.  DISCHARGE INSTRUCTIONS:  An aerobic interpreter who happened to be the director of the refugee camp was able to translate the patient instructions.  The patient is to check her blood sugars twice a day. She is to take insulin 15 units subcu once in the morning and again at supper time.  She is also to take metformin 500 mg by mouth once every 12 hours.  She was advised to eat a carb-modified and low-sodium diet. She was highly encouraged to follow up with her primary care provider on May 17, 2010.  Marinus Maw, the refugee camp Mud[employer], told me that they will get a case manager to help the patient follow up with her medical appointments.  SPECIAL INSTRUCTIONS:  Stop any activity that causes chest pain, shortness of breath, dizziness, sweating, or excessive weakness.  Avoid straining if the patient were to sweat excessively, feel dizzy, or lose consciousness, she is to come to the hospital immediately.  FOLLOWUP APPOINTMENTS:  Willaim Bane, MD at [facility] on May 17, 2010, at 3:00 p.m.  DISCHARGE CONDITION:  The patient was discharged home in stable medical condition.    ______________________________ Donnamarie Rossetti, MD   ______________________________ Dickie La, MD    ID/MEDQ  D:  05/07/2010  T:  05/08/2010  Job:  [id number]  Electronically Signed by Donnamarie Rossetti MD on 05/13/2010 11:59:16 PM Electronically Signed by Dorcas Mcmurray MD on 05/25/2010 02:30:24 PM

## 2010-06-09 NOTE — H&P (Signed)
[name], Lane                 ACCOUNT NO.:  [id number]  MEDICAL RECORD NO.:  [id number]           PATIENT TYPE:  LOCATION:                                 FACILITY:  PHYSICIAN:  [place] A. Walker Kehr, M.D.    DATE OF BIRTH:  1960-04-30  DATE OF ADMISSION: DATE OF DISCHARGE:                             HISTORY & PHYSICAL   PRIMARY CARE PROVIDER:  Willaim Bane, MD  CHIEF COMPLAINT:  Chest pain and hyperglycemia.  HISTORY OF PRESENT ILLNESS:  Kimberly Lane is a 50 year old female presenting with headache, dizziness, and just not feeling well x1 day. The patient had blood pressure checked at local community center, and it was elevated.  The patient came to ER for evaluation for this reason. The patient also reports chest pain times 3 years that is worse with ambulation for long distances such as half a mile.  No shortness of breath with short distances.  Sometimes, chest pain occurs after eating a meal, chest pain is located over the sternum, is worse with palpation of the area.  The patient also endorses increased thirst.  No urinary or bowel complaints.  No fever.  No cold symptoms.  All information was obtained via Arabic interpreter that is at bedside.  PAST MEDICAL HISTORY: 1. Hypertension. 2. Tuberculosis.  No past surgical history.  SOCIAL HISTORY:  The patient is a refugee from [place], and interpreter and case worker were present with the patient in the ED at the time of admission.  The patient speaks Arabic, husband works 3 hours of way at a [employer], but often the patient lives at home alone.  The patient denies tobacco, alcohol, or drug use.  FAMILY HISTORY:  Negative.  ALLERGIES:  No known drug allergies.  MEDICATIONS:  The patient is taking vitamin B6.  I am unsure if the patient has been treated for TB.  REVIEW OF SYSTEMS:  Per HPI.  PHYSICAL EXAMINATION:  VITAL SIGNS:  Heart rate 69, blood pressure 185/102, respiratory rate 18, pulse ox 94% on room  air, temperature 97.5. GENERAL:  In no acute distress, resting quietly. NECK:  No lymphadenopathy.  Mild thyromegaly.  Some tenderness on palpation of the thyroid. HEART:  Regular rate and rhythm.  No murmurs, rubs, or gallops.  Normal sinus rhythm on monitor.  Positive tenderness on palpation of the central chest. LUNGS:  Clear to auscultation bilaterally.  No wheezing bilaterally. ABDOMEN:  Soft, nontender, nondistended.  No rebound, no guarding. EXTREMITIES:  No edema, 2+ pedal pulses. SKIN:  No rash. NEUROLOGIC:  Alert and oriented x3, following all commands.  LABORATORY RESULTS:  Sodium 133, potassium 3.8, creatinine 0.68, glucose 468, white blood cells 7, hemoglobin 13.2.  CK total 140, troponin 0.02. Point-of-care cardiac enzymes within normal limits.  Chest x-ray, no evidence of acute pulmonary disease.  A and P:  Kimberly Lane is a 50 year old female presenting with chest pain and     hyperglycemia.   1. Chest pain. may be secondary to reflux.  We will     treat with GI cocktail to see if symptoms improve.  We will also  cycle cardiac enzymes and repeat EKG in a.m. to rule out ischemic     event that would cause pain.  This is a chronic problem x3 years,     so an acute MI is less likely.  May need to be on PPI chronically     to see if this helps symptoms.  I am not sure why the patient has     shortness of breath with walking one-half mile or greater.  If all     the above plan is normal, shortness of breath may be secondary to     deconditioning. 2. Hypertension.  The patient is not on any blood pressure medications     and with a history of hypertension, it is good for the patient that     she most likely will always need to take blood pressure     medications.  I started lisinopril since the patient has a new     diagnosis of diabetes and since the patient's creatinine is within     normal limits. 3. Hyperglycemia.  Most likely indicates diagnosis of diabetes.   We     will check A1c.  We will monitor CBGs.  We will cover with sliding     scale insulin.  We will add scheduled insulin if there is an     insulin requirement.  The patient will need to be set up with     diabetes education as an outpatient. 4. Thyromegaly.  Based on exam, we will order TSH since thyroid is     enlarged and the patient had diffuse tenderness on exam. 5. Fluids, electrolytes, and nutrition/gastrointestinal.  Carb-     modified diet. 6. Prophylaxis.  Heparin 5000 units t.i.d. 7. Disposition pending clinical improvement.     Dorthey Sawyer, MD   ______________________________ Arty Baumgartner. Walker Kehr, M.D.    DC/MEDQ  D:  05/05/2010  T:  05/05/2010  Job:  [id number]  Electronically Signed by Dorthey Sawyer  on 06/08/2010 02:24:20 PM Electronically Signed by Candelaria Celeste M.D. on 06/09/2010 04:51:06 AM

## 2010-06-10 ENCOUNTER — Encounter: Payer: Self-pay | Admitting: Family Medicine

## 2010-06-10 ENCOUNTER — Ambulatory Visit (INDEPENDENT_AMBULATORY_CARE_PROVIDER_SITE_OTHER): Payer: Self-pay | Admitting: Family Medicine

## 2010-06-10 VITALS — BP 154/76 | HR 72 | Temp 98.5°F | Ht 62.0 in | Wt 148.0 lb

## 2010-06-10 DIAGNOSIS — E1165 Type 2 diabetes mellitus with hyperglycemia: Secondary | ICD-10-CM

## 2010-06-10 MED ORDER — INSULIN GLARGINE 100 UNIT/ML ~~LOC~~ SOLN: SUBCUTANEOUS | Status: DC

## 2010-06-10 NOTE — Patient Instructions (Signed)
It was a pleasure to meet with you today.  Please check your blood sugars in the morning and 2 hours after at least two meals daily.   Start taking lantus 10  Units nightly and stop humulin.   Return to clinic in 2 weeks f/u DM

## 2010-06-10 NOTE — Progress Notes (Signed)
Subjective:    Patient here for follow-up of elevated blood pressure.  She is not exercising and is adherent to a low-salt diet.  Blood pressure is not monitored at home well controlled at home. Cardiac symptoms: none. Patient denies: chest pain, chest pressure/discomfort, dyspnea, exertional chest pressure/discomfort, fatigue, lower extremity edema and syncope. Cardiovascular risk factors: diabetes mellitus, dyslipidemia, hypertension and sedentary lifestyle. Use of agents associated with hypertension: none. History of target organ damage: none. Pt states checking her blood sugar at home with values being 224, 269, 177, 131, 160, 260, 193, 191, 229, 244.  Interview conducted with the assistance of interpreter/translation.   The following portions of the patient's history were reviewed and updated as appropriate: allergies, current medications, past family history, past medical history, past social history, past surgical history and problem list.  Review of Systems Pertinent items are noted in HPI.     Objective:    BP 154/76  Pulse 72  Temp(Src) 98.5 F (36.9 C) (Oral)  Ht 5\' 2"  (1.575 m)  Wt 148 lb (67.132 kg)  BMI 27.07 kg/m2  General Appearance:    Alert, cooperative, no distress, appears stated age  Head:    Normocephalic, without obvious abnormality, atraumatic  Eyes:    PERRL, conjunctiva/corneas clear, EOM's intact, fundi    benign, both eyes  Ears:    Normal TM's and external ear canals, both ears  Nose:   Nares normal, septum midline, mucosa normal, no drainage    or sinus tenderness  Throat:   Lips, mucosa, and tongue normal; teeth and gums normal  Neck:   Supple, symmetrical, trachea midline, no adenopathy;    thyroid:  no enlargement/tenderness/nodules; no carotid   bruit or JVD  Back:     Symmetric, no curvature, ROM normal, no CVA tenderness  Lungs:     Clear to auscultation bilaterally, respirations unlabored  Chest Wall:    No tenderness or deformity   Heart:     Regular rate and rhythm, S1 and S2 normal, no murmur, rub   or gallop  Breast Exam:    No tenderness, masses, or nipple abnormality  Abdomen:     Soft, non-tender, bowel sounds active all four quadrants,    no masses, no organomegaly  Genitalia:    Normal female without lesion, discharge or tenderness  Rectal:    Normal tone, normal prostate, no masses or tenderness;   guaiac negative stool  Extremities:   Extremities normal, atraumatic, no cyanosis or edema  Pulses:   2+ and symmetric all extremities  Skin:   Skin color, texture, turgor normal, no rashes or lesions  Lymph nodes:   Cervical, supraclavicular, and axillary nodes normal  Neurologic:   CNII-XII intact, normal strength, sensation and reflexes    throughout      Assessment:    Hypertension, stage 1 on BP meds. Evidence of target organ damage: none.  2. DM II, uncontrolled   Plan:    Follow up: 2 weeks and as needed. start lantus 10 Units SQ nightly.   d/c humulin Information given to patient regarding accucheck in the am and 2 hours postprandial.  Information also given regarding titration of lantus if fasting greater than 130.  However, pt is with limited english and will not likely be able to comply with home adjustments.  Nevertheless, will have the pt to return in 2 weeks. Continue current BP regiment.

## 2010-06-28 ENCOUNTER — Ambulatory Visit: Payer: Self-pay | Admitting: Family Medicine

## 2010-07-14 ENCOUNTER — Encounter: Payer: Self-pay | Admitting: *Deleted

## 2010-07-15 NOTE — Telephone Encounter (Signed)
This encounter was created in error - please disregard.

## 2010-08-04 ENCOUNTER — Other Ambulatory Visit: Payer: Self-pay | Admitting: *Deleted

## 2010-08-04 DIAGNOSIS — I10 Essential (primary) hypertension: Secondary | ICD-10-CM

## 2010-08-04 NOTE — Telephone Encounter (Addendum)
Received call from Clancy from [facility]  on voice message stating note had been sent to Dr. Megan Salon earlier asking if lisinopril can be changed to accupril because [facility] can get this for her. Patient is currently out of lisinopril. Also if she chooses to continue on lisinopril they want to verify dosage. Patient reports taking lisinopril 20 mg two daily.  But they have a bottle that states one daily. Please advise. Dr. Megan Salon paged. Spoke with Dr. Megan Salon and she will check on this.

## 2010-08-05 NOTE — Telephone Encounter (Signed)
Paged Dr. Megan Salon again this AM and she advises that patient can be switched to Accupril 40 mg daily . Will call Crystal at [facility] this afternoon as she is out of office this AM.

## 2010-08-09 ENCOUNTER — Telehealth: Payer: Self-pay | Admitting: *Deleted

## 2010-08-09 DIAGNOSIS — I10 Essential (primary) hypertension: Secondary | ICD-10-CM

## 2010-08-09 MED ORDER — QUINAPRIL HCL 40 MG PO TABS: 40.0000 mg | ORAL_TABLET | Freq: Every day | ORAL | Status: DC

## 2010-08-09 NOTE — Telephone Encounter (Signed)
[facility]  For Norvasc 10 mg . Will forward message  to Dr. Megan Salon.

## 2010-08-09 NOTE — Telephone Encounter (Signed)
Rx for Accuptril 40 mg one daily was called in  08/06/2010.

## 2010-08-10 MED ORDER — AMLODIPINE BESYLATE 10 MG PO TABS: 10.0000 mg | ORAL_TABLET | Freq: Every day | ORAL | Status: DC

## 2010-08-10 NOTE — Telephone Encounter (Signed)
Paged Dr. Megan Salon yesterday and she advises ok to refill for Norvasc for one month and patient needs appointment before next refill.  Spoke with Crystal at [facility] and she will advise patient to make appointment.

## 2010-08-12 ENCOUNTER — Ambulatory Visit: Payer: Self-pay | Admitting: Family Medicine

## 2010-08-18 ENCOUNTER — Ambulatory Visit (INDEPENDENT_AMBULATORY_CARE_PROVIDER_SITE_OTHER): Payer: Self-pay | Admitting: Family Medicine

## 2010-08-18 ENCOUNTER — Encounter: Payer: Self-pay | Admitting: Family Medicine

## 2010-08-18 DIAGNOSIS — E1165 Type 2 diabetes mellitus with hyperglycemia: Secondary | ICD-10-CM

## 2010-08-18 DIAGNOSIS — E119 Type 2 diabetes mellitus without complications: Secondary | ICD-10-CM

## 2010-08-18 DIAGNOSIS — I10 Essential (primary) hypertension: Secondary | ICD-10-CM

## 2010-08-18 DIAGNOSIS — M79605 Pain in left leg: Secondary | ICD-10-CM

## 2010-08-18 DIAGNOSIS — M79609 Pain in unspecified limb: Secondary | ICD-10-CM

## 2010-08-18 LAB — POCT GLYCOSYLATED HEMOGLOBIN (HGB A1C): Hemoglobin A1C: 9.3

## 2010-08-18 MED ORDER — HYDROCHLOROTHIAZIDE 12.5 MG PO TABS: 12.5000 mg | ORAL_TABLET | Freq: Every day | ORAL | Status: DC

## 2010-08-18 NOTE — Patient Instructions (Addendum)
Keep taking all of your medications as before. Keep checking your sugars first thing in the morning.  Your new blood pressure medicine in hydrochlorothiazide - take this every day for blood pressure.  Come back in one week for a pharmacy visit with Dr. Valentina Lucks to get diabetes education and to check the blood flow in your legs.   - Dr. Sherilyn Cooter

## 2010-08-19 DIAGNOSIS — M79605 Pain in left leg: Secondary | ICD-10-CM | POA: Insufficient documentation

## 2010-08-19 NOTE — Assessment & Plan Note (Signed)
History (though difficult to obtain) appears to be somewhat consistent with intermittent claudication. Will obtain ankle-brachial index to evaluate at pharmacy visit with Dr. Valentina Lucks. Consider arterial dopplers and further management based on results as well.

## 2010-08-19 NOTE — Assessment & Plan Note (Signed)
Improved though not at goal. Will not make any changes to regimen at this time to prevent confusion as she has had difficulty in following instructions due to language barrier. Will refer to pharmacy for further diabetic management and teaching. Would titrate long acting insulin upward at that time based on her fasting blood sugars. Advised regarding diabetic diet. Needs ophthalmology exam - already arranged for July 2012. Follow up with Dr. Valentina Lucks in pharmacy clinic.

## 2010-08-19 NOTE — Progress Notes (Signed)
  Subjective:    Patient ID: Kimberly Lane, female    DOB: 1960/02/24, 50 y.o.   MRN: [id number]   Here with community nurse and with translator (Arabic) - at times difficult to obtain accurate history.   HPI  1) Diabetes mellitus type 2: New onset diagnosed in March 2012 during recent hospitalization. A1C at that time was 15.9. Today she is at 9.3. She was started on Lantus 10 units (given instructions to increase at last visit but has not done so) and metformin during her hospitalization which she is taking every day. She checks her sugars each morning and checks post-prandials after largest meal intermittently. Fasting values range from 170's to 220's. Post-prandials range from 230's to 260's. She has changed her diet since her diagnosis, including avoiding juices, fruits that are high in sugar. She reports improvement in polydipsia and polyuria. Denies hypoglycemic symptoms. Plan to see ophthalmologist in July.   2) HTN: Blood pressure checks at home 140's to 150's / 70's to 80's. Taking carvedilol, quinapril and amlodipine without side effects. Denies chest pain, dyspnea  3) Leg pain: left leg. X several months. Worse with ambulation, better with rest. Reports some numbness in bilateral feet. Reports history of low back pain (but pain does not appear to radiate)  Pertinent past history reviewed   Review of Systems As per HPI     Objective:   Physical Exam Filed Vitals:   08/18/10 1524  BP: 151/74  Pulse: 67  Temp: 98 F (36.7 C)      General: well appearing, NAD  Heart: no murmurs; regular rate and rhythm  Lungs: clear to auscultation  Extremities: +1 bilateral pitting edema. Faint pedal pulses bilaterally. Loss of sensation noted bilaterally to just above ankles to pinprick. No ulcers noted.   Assessment & Plan:

## 2010-08-19 NOTE — Assessment & Plan Note (Signed)
Not at goal 130/80. Will add hydrochlorothiazide for stroke prevention (and given LE edema). Follow up in pharmacy clinic in one week. Consider basic metabolic panel at that time. Advised regarding reducing salt intake - difficult to assess how well patient understands concepts given language barrier.

## 2010-08-27 ENCOUNTER — Encounter: Payer: Self-pay | Admitting: Pharmacist

## 2010-08-27 ENCOUNTER — Ambulatory Visit (INDEPENDENT_AMBULATORY_CARE_PROVIDER_SITE_OTHER): Payer: Self-pay | Admitting: Pharmacist

## 2010-08-27 VITALS — BP 148/69 | HR 71 | Temp 98.2°F | Wt 144.0 lb

## 2010-08-27 DIAGNOSIS — I10 Essential (primary) hypertension: Secondary | ICD-10-CM

## 2010-08-27 DIAGNOSIS — M79605 Pain in left leg: Secondary | ICD-10-CM

## 2010-08-27 DIAGNOSIS — E1165 Type 2 diabetes mellitus with hyperglycemia: Secondary | ICD-10-CM

## 2010-08-27 DIAGNOSIS — M79609 Pain in unspecified limb: Secondary | ICD-10-CM

## 2010-08-27 DIAGNOSIS — IMO0002 Reserved for concepts with insufficient information to code with codable children: Secondary | ICD-10-CM

## 2010-08-27 MED ORDER — AMLODIPINE BESYLATE 10 MG PO TABS: 10.0000 mg | ORAL_TABLET | Freq: Every day | ORAL | Status: DC

## 2010-08-27 MED ORDER — HYDROCHLOROTHIAZIDE 12.5 MG PO TABS: 12.5000 mg | ORAL_TABLET | Freq: Every day | ORAL | Status: DC

## 2010-08-27 MED ORDER — INSULIN GLARGINE 100 UNIT/ML ~~LOC~~ SOLN: 12.0000 [IU] | Freq: Every day | SUBCUTANEOUS | Status: DC

## 2010-08-27 NOTE — Assessment & Plan Note (Signed)
Leg Pain:  Significant reduction in ABI = 0.60  Educated on result and need for good foot care.  At this time her legs appear to have dry skin with otherwise appear to have no wounds and are warm to touch.

## 2010-08-27 NOTE — Assessment & Plan Note (Signed)
Not yet at goal on BP possibly due to not yet available HCTZ previously prescribed.   Recheck once HCTZ is started.

## 2010-08-27 NOTE — Assessment & Plan Note (Signed)
Leg Pain:  Significant reduction in ABI = 0.60  Educated on result and need for good foot care.  At this time her legs appear to have dry skin with otherwise appear to have no wounds and are warm to touch.   Diabetes:  Improved control with Lantus however postprandial control is unknown as patient is only checking fasting CBGs.   Will increase lantus slightly to attempt lower fasting CBGs<150 as short term goal.  Increase Lantus from 10 to 12 units QPM.  Patient is having nurse draw up insulin in syringes for injection.    A preference for lantus solostar AND patient education is planned for visit in 2 weeks.  New Rx called in to [facility] for lantus and refills on quinapril, amlodipine and new rx for HCTZ.  TTFFC: 45 minutes.

## 2010-08-27 NOTE — Progress Notes (Signed)
  Subjective:    Patient ID: Kimberly Lane, female    DOB: Apr 22, 1960, 50 y.o.   MRN: [id number]  HPI Patient arrives with congregational nurse Mardene Celeste) AND interpreter Avie Arenas).   Patient reports diagnosis of diabetes in November of 2011 and was previously taking 70/30 insulin.   She eats one main meal per day at 12:00 noon.  Her intake is variable and includes a home made flat bread, soup, fava beans and milk.   She did NOT get a prescription for HCTZ filled at [facility].    Home CBG readings over last 7 days were 177 and last 14 days were 181.  All of these readings were fasting.   Patient reports leg pain which is throbbing in nature and is always present.    Review of Systems     Objective:   Physical Exam    ABI:   Overall = 60% Left and right arm pressures:  152 mmHg RightDP 102 and PT: 82 Left  DP 92 and PT: 88    Assessment & Plan:  Leg Pain:  Significant reduction in ABI = 0.60  Educated on result and need for good foot care.  At this time her legs appear to have dry skin with otherwise appear to have no wounds and are warm to touch.   Diabetes:  Improved control with Lantus however postprandial control is unknown as patient is only checking fasting CBGs.   Will increase lantus slightly to attempt lower fasting CBGs<150 as short term goal.  Increase Lantus from 10 to 12 units QPM.  Patient is having nurse draw up insulin in syringes for injection.    A preference for lantus solostar AND patient education is planned for visit in 2 weeks.  New Rx called in to [facility] for lantus and refills on quinapril, amlodipine and new rx for HCTZ.  TTFFC: 45 minutes.

## 2010-08-27 NOTE — Progress Notes (Signed)
  Subjective:    Patient ID: Kimberly Lane, female    DOB: 06-29-1960, 50 y.o.   MRN: [id number]  HPI  Reviewed and agree with Dr. Graylin Shiver management.    Review of Systems     Objective:   Physical Exam        Assessment & Plan:

## 2010-08-27 NOTE — Patient Instructions (Signed)
Increase Lantus Insulin to 12 units each night.  Start HCTZ once daily.  Please check blood sugar twice daily for the next two weeks. We will try to switch to Lantus pen at next pharmacist visit in 2 weeks.  For leg test did show decreased blood flow. It is important to take good care of your feet.

## 2010-09-03 ENCOUNTER — Other Ambulatory Visit: Payer: Self-pay | Admitting: Family Medicine

## 2010-09-03 MED ORDER — METFORMIN HCL 1000 MG PO TABS: 1000.0000 mg | ORAL_TABLET | Freq: Two times a day (BID) | ORAL | Status: DC

## 2010-09-03 MED ORDER — CARVEDILOL 3.125 MG PO TABS: 3.1250 mg | ORAL_TABLET | Freq: Two times a day (BID) | ORAL | Status: DC

## 2010-09-07 ENCOUNTER — Ambulatory Visit: Payer: Self-pay | Admitting: Pharmacist

## 2010-09-08 ENCOUNTER — Telehealth: Payer: Self-pay | Admitting: *Deleted

## 2010-09-08 NOTE — Telephone Encounter (Signed)
Received refill request for Norvasc 10 mg one tab daily . Pharmacy is [facility]. Will forward to MD.

## 2010-09-10 NOTE — Telephone Encounter (Signed)
It is noted medication was refilled 08/27/2010 .

## 2010-09-24 ENCOUNTER — Ambulatory Visit: Payer: Self-pay | Admitting: Pharmacist

## 2010-11-26 ENCOUNTER — Telehealth: Payer: Self-pay | Admitting: Family Medicine

## 2010-11-26 NOTE — Telephone Encounter (Signed)
Calling about them getting a fax stating she is to discontinue her Accupril.  Looks like someone has changed her PCP to a doctor at [facility] hospital.  They are asking for [place] to let them know if this is what she is to do.

## 2010-11-26 NOTE — Telephone Encounter (Signed)
Called [facility] and spoke with pharmacist to see if I could get the fax where this patients Accupril was stopped.  After pharmacist looked in chart they realized another patients information was in [facility] chart.  So the information about stopping Accupril is inaccurate.  Left message for Shawna Orleans to have patient continue her Accupril.  She is not to discontinue this medicaiton.  Lauralyn Primes

## 2011-02-07 ENCOUNTER — Telehealth: Payer: Self-pay | Admitting: Family Medicine

## 2011-02-07 ENCOUNTER — Encounter: Payer: Self-pay | Admitting: *Deleted

## 2011-02-07 DIAGNOSIS — I10 Essential (primary) hypertension: Secondary | ICD-10-CM

## 2011-02-07 MED ORDER — QUINAPRIL HCL 40 MG PO TABS: 40.0000 mg | ORAL_TABLET | Freq: Every day | ORAL | Status: DC

## 2011-02-07 MED ORDER — CARVEDILOL 3.125 MG PO TABS: 3.1250 mg | ORAL_TABLET | Freq: Two times a day (BID) | ORAL | Status: DC

## 2011-02-07 MED ORDER — ASPIRIN 81 MG PO TABS: 81.0000 mg | ORAL_TABLET | Freq: Every day | ORAL | Status: DC

## 2011-02-07 MED ORDER — METFORMIN HCL 1000 MG PO TABS: 1000.0000 mg | ORAL_TABLET | Freq: Two times a day (BID) | ORAL | Status: DC

## 2011-02-07 MED ORDER — INSULIN GLARGINE 100 UNIT/ML ~~LOC~~ SOLN: 12.0000 [IU] | Freq: Every day | SUBCUTANEOUS | Status: DC

## 2011-02-07 MED ORDER — HYDROCHLOROTHIAZIDE 25 MG PO TABS: ORAL_TABLET | ORAL | Status: DC

## 2011-02-07 MED ORDER — AMLODIPINE BESYLATE 10 MG PO TABS: 10.0000 mg | ORAL_TABLET | Freq: Every day | ORAL | Status: DC

## 2011-02-07 NOTE — Telephone Encounter (Signed)
Pt is out of insulin and there is a huge language barrier - doesn't have orange card right now and is not sure if they can afford any.  Needs to talk to nurse to see what we can do to help her.

## 2011-02-07 NOTE — Telephone Encounter (Signed)
Norman Clay nurse with [facility] calls to report patient needs  rx for all meds sent to [facility].  meds sent with one month supply.

## 2011-02-07 NOTE — Telephone Encounter (Signed)
Spoke with Teressa Senter and she states patient was advised to see Clarice Pole today . This was set up , she was given bus fare and patient went to [facility] instead. They will not help her  there and " have washed hands of her  " .  Teressa Senter is wondering if we have any samples of meds to give patient . Advised that we do not.  She schedules appointment for patient with PCP for 03/08/2011 .  She states they have a plan in place with [facility] to provide payment for emergency  cases.  She wants all meds called to pharmacy . Advised to provide list of medications she needs and will send in a months supply .She will still encourage patient to follow up with [facility].   She will call back.

## 2011-02-08 ENCOUNTER — Other Ambulatory Visit: Payer: Self-pay | Admitting: *Deleted

## 2011-02-08 MED ORDER — INSULIN PEN NEEDLE 29G X 12MM MISC: Status: DC

## 2011-02-08 NOTE — Telephone Encounter (Signed)
This encounter was created in error - please disregard.

## 2011-02-22 DIAGNOSIS — R7611 Nonspecific reaction to tuberculin skin test without active tuberculosis: Secondary | ICD-10-CM

## 2011-02-22 HISTORY — DX: Nonspecific reaction to tuberculin skin test without active tuberculosis: R76.11

## 2011-03-08 ENCOUNTER — Telehealth: Payer: Self-pay | Admitting: *Deleted

## 2011-03-08 ENCOUNTER — Ambulatory Visit (INDEPENDENT_AMBULATORY_CARE_PROVIDER_SITE_OTHER): Payer: Self-pay | Admitting: Family Medicine

## 2011-03-08 ENCOUNTER — Encounter: Payer: Self-pay | Admitting: Family Medicine

## 2011-03-08 VITALS — BP 164/77 | HR 73 | Temp 98.2°F | Ht 62.0 in | Wt 149.2 lb

## 2011-03-08 DIAGNOSIS — E119 Type 2 diabetes mellitus without complications: Secondary | ICD-10-CM

## 2011-03-08 DIAGNOSIS — I10 Essential (primary) hypertension: Secondary | ICD-10-CM

## 2011-03-08 DIAGNOSIS — E1165 Type 2 diabetes mellitus with hyperglycemia: Secondary | ICD-10-CM

## 2011-03-08 MED ORDER — QUINAPRIL HCL 40 MG PO TABS: 40.0000 mg | ORAL_TABLET | Freq: Every day | ORAL | Status: DC

## 2011-03-08 MED ORDER — CARVEDILOL 3.125 MG PO TABS: 6.2500 mg | ORAL_TABLET | Freq: Two times a day (BID) | ORAL | Status: DC

## 2011-03-08 MED ORDER — ISONIAZID 100 MG PO TABS: 100.0000 mg | ORAL_TABLET | Freq: Every day | ORAL | Status: DC

## 2011-03-08 MED ORDER — INSULIN GLARGINE 100 UNIT/ML ~~LOC~~ SOLN: 15.0000 [IU] | Freq: Every day | SUBCUTANEOUS | Status: DC

## 2011-03-08 MED ORDER — METFORMIN HCL 1000 MG PO TABS: 1000.0000 mg | ORAL_TABLET | Freq: Two times a day (BID) | ORAL | Status: DC

## 2011-03-08 MED ORDER — AMLODIPINE BESYLATE 10 MG PO TABS: 10.0000 mg | ORAL_TABLET | Freq: Every day | ORAL | Status: DC

## 2011-03-08 MED ORDER — "INSULIN SYRINGE-NEEDLE U-100 28G X 1/2"" 0.3 ML MISC": 1.0000 [IU] | Freq: Every day | Status: DC

## 2011-03-08 MED ORDER — PYRIDOXINE HCL 25 MG PO TABS: 25.0000 mg | ORAL_TABLET | Freq: Every day | ORAL | Status: DC

## 2011-03-08 MED ORDER — HYDROCHLOROTHIAZIDE 25 MG PO TABS: 25.0000 mg | ORAL_TABLET | Freq: Every day | ORAL | Status: DC

## 2011-03-08 NOTE — Telephone Encounter (Signed)
Caryl Pina from outpatient pharmacy called, they way Mrs. Bole was able to get meds thru them last time was through a support system.  In order for her to get them again she would need to have paper Rxs and got thru the same process as last time.  Advised that patient and MD have left but would have MD to print signed paper copies, and will call pt to come pick them up. Fleeger, Salome Spotted

## 2011-03-08 NOTE — Patient Instructions (Addendum)
Please increase the following medications:  - Carvedilol to 1 tablets twice daily  - Hydrochlorithiazide to 1 tablet daily  - Insulin to 15 units at night.  Please check you blood sugars before each meal and at bedtime. Write these numbers in your log book. Please bring your log book to your next appointment in 3-4 weeks.

## 2011-03-08 NOTE — Assessment & Plan Note (Signed)
Worsened. Increase HCTZ to 25mg  daily Increase cavedilol to 6.25mg  bid

## 2011-03-08 NOTE — Progress Notes (Signed)
  Subjective:    Patient ID: Kimberly Lane, female    DOB: 09-22-1960, 51 y.o.   MRN: [id number]  HPI This is a 51 year old female with DM2 and HTN who presents for follow up.  She reports checking her blood sugars once a day in the morning.  She is currently taking metformin 1000mg  bid and Lantus 12 units at bedtime.  She did not bring her log book with her.  She checks her feet daily.    She also is seen for her blood pressure, which remains elevated.  She denies chest pain, SOB, chronic headaches, changes in her vision, peripheral edema.    Review of Systems     Objective:   Physical Exam  Constitutional: She appears well-developed and well-nourished.  Neck: Normal range of motion.  Cardiovascular: Normal rate, regular rhythm and normal heart sounds.   Pulmonary/Chest: Breath sounds normal. No respiratory distress. She has no wheezes. She has no rales. She exhibits no tenderness.  Neurological:       Absent sensation to monofilament to feet bilaterally.  Skin:       Feet are cool to touch.      Assessment & Plan:

## 2011-03-08 NOTE — Assessment & Plan Note (Signed)
HgA1c 9.5 - worsening. Stressed importance of checking feet daily Increase CGB to 4 times daily Increase lantus to 15 units.   RTC in 3 weeks.  May need to start meal time coverage.

## 2011-03-10 NOTE — Telephone Encounter (Signed)
Kimberly Lane -  Could you have one of the residents print them off?

## 2011-03-11 NOTE — Telephone Encounter (Signed)
Apolonio Schneiders,   Can you do this for me?

## 2011-03-24 ENCOUNTER — Other Ambulatory Visit: Payer: Self-pay | Admitting: *Deleted

## 2011-03-24 MED ORDER — METFORMIN HCL 1000 MG PO TABS: 1000.0000 mg | ORAL_TABLET | Freq: Two times a day (BID) | ORAL | Status: DC

## 2011-03-24 NOTE — Telephone Encounter (Signed)
Received refill request from [facility] for metformin. [facility] and was told they did not receive the refill request sent in 01/15 but patient got refills in December and they can only give her Rx one time. Called the [facility] and called in new RX for metformin # 60 and 2 refills.

## 2011-03-25 ENCOUNTER — Telehealth: Payer: Self-pay | Admitting: *Deleted

## 2011-03-29 ENCOUNTER — Ambulatory Visit (INDEPENDENT_AMBULATORY_CARE_PROVIDER_SITE_OTHER): Payer: Self-pay | Admitting: Family Medicine

## 2011-03-29 ENCOUNTER — Encounter: Payer: Self-pay | Admitting: Family Medicine

## 2011-03-29 DIAGNOSIS — E1165 Type 2 diabetes mellitus with hyperglycemia: Secondary | ICD-10-CM

## 2011-03-29 DIAGNOSIS — I10 Essential (primary) hypertension: Secondary | ICD-10-CM

## 2011-03-29 MED ORDER — INSULIN LISPRO 100 UNIT/ML ~~LOC~~ SOLN: 3.0000 [IU] | Freq: Three times a day (TID) | SUBCUTANEOUS | Status: DC

## 2011-03-29 NOTE — Assessment & Plan Note (Signed)
Will start humalog prior to meals - 3 units.  Starting low due to restarting metformin.  F/u in 4 weeks.

## 2011-03-29 NOTE — Progress Notes (Signed)
  Subjective:    Patient ID: Kimberly Lane, female    DOB: November 15, 1960, 51 y.o.   MRN: [id number]  HPI Patient seen for diabetes and hypertension. The patient brought her logbook and her meter. She has been checking her blood sugars before breakfast and before lunch. She does not check them before dinner or at bedtime. Her fasting blood sugar has an average of 209. Her average lunchtime blood sugar is 240. She denies hypoglycemic episodes. She denies polyuria, polydipsia.  She does not check her blood pressure at home. She's been taking her blood pressure medications. She denies headaches, blurred vision, peripheral edema.   Review of Systems     Objective:   Physical Exam  Constitutional: She appears well-developed and well-nourished.  Psychiatric: She has a normal mood and affect. Her behavior is normal. Judgment and thought content normal.      Assessment & Plan:

## 2011-03-29 NOTE — Patient Instructions (Signed)
Start checking blood sugar and taking insulin 3 units prior to each meal. Take 15 units of lantus at bedtime. Avoid bread, rice, pasta, and potato.

## 2011-03-29 NOTE — Assessment & Plan Note (Signed)
Improved.  Not at goal yet. Will recheck in 4 weeks.

## 2011-04-26 ENCOUNTER — Ambulatory Visit (HOSPITAL_COMMUNITY)
Admission: RE | Admit: 2011-04-26 | Discharge: 2011-04-26 | Disposition: A | Discharge status: Home / Self Care | Payer: Self-pay | Source: Ambulatory Visit | Attending: Family Medicine | Admitting: Family Medicine

## 2011-04-26 ENCOUNTER — Other Ambulatory Visit: Payer: Self-pay

## 2011-04-26 ENCOUNTER — Encounter: Payer: Self-pay | Admitting: Family Medicine

## 2011-04-26 ENCOUNTER — Ambulatory Visit (INDEPENDENT_AMBULATORY_CARE_PROVIDER_SITE_OTHER): Payer: Self-pay | Admitting: Family Medicine

## 2011-04-26 VITALS — BP 198/82 | HR 77 | Temp 97.5°F | Ht 62.0 in | Wt 146.5 lb

## 2011-04-26 DIAGNOSIS — I498 Other specified cardiac arrhythmias: Secondary | ICD-10-CM | POA: Insufficient documentation

## 2011-04-26 DIAGNOSIS — M79603 Pain in arm, unspecified: Secondary | ICD-10-CM

## 2011-04-26 DIAGNOSIS — Z111 Encounter for screening for respiratory tuberculosis: Secondary | ICD-10-CM

## 2011-04-26 DIAGNOSIS — Z8611 Personal history of tuberculosis: Secondary | ICD-10-CM | POA: Insufficient documentation

## 2011-04-26 DIAGNOSIS — E1165 Type 2 diabetes mellitus with hyperglycemia: Secondary | ICD-10-CM

## 2011-04-26 DIAGNOSIS — IMO0001 Reserved for inherently not codable concepts without codable children: Secondary | ICD-10-CM

## 2011-04-26 DIAGNOSIS — R079 Chest pain, unspecified: Secondary | ICD-10-CM

## 2011-04-26 DIAGNOSIS — R9431 Abnormal electrocardiogram [ECG] [EKG]: Secondary | ICD-10-CM | POA: Insufficient documentation

## 2011-04-26 DIAGNOSIS — M79609 Pain in unspecified limb: Secondary | ICD-10-CM

## 2011-04-26 DIAGNOSIS — I10 Essential (primary) hypertension: Secondary | ICD-10-CM

## 2011-04-26 NOTE — Assessment & Plan Note (Signed)
Patient has INH and pyridoxine on her list but unsure why - she does not know and I can't find in notes.  Will defer to Dr Nehemiah Settle

## 2011-04-26 NOTE — Patient Instructions (Addendum)
I think your arm pain is related to arthritis - Take Tylenol (acetominophen) 2 tabs three times daily for pain.     If you have severe chest pain with shortness of breath you should go to the ER  Get all your medications filled and take every day.  Get more before you run out  Your diabetes is doing well Continue the same dose of insulin   Check to see if you have the TB medicines at home   Bring in all your medications every visit  See Dr Nehemiah Settle or myself  in 1-2 weeks to recheck your blood pressure

## 2011-04-26 NOTE — Progress Notes (Signed)
  Subjective:    Patient ID: Kimberly Lane, female    DOB: Apr 13, 1960, 51 y.o.   MRN: [id number]  HPI Patient is accompanied by her relative who translates  Arm Chest Pain Complains of bilateral arm pain that is sometimes associated with chest pain.  As best I can determine the pain is intermittent but is not related to exertion.  Seems to be worsened with arm movement.  No pain in her legs.   No joint swelling or redness.  No recent injury.  Has been present for weeks.  Hypertension She does not take her blood pressure at home.   When asked she assures me she is taking all her medications.  On reviewing her bottles her norvasc and hctz are empty and she has been out of these for about a week.   Her coreg bottle is also empty.  She denies lightheadness or edema   Diabetes Her blood sugar log correlates with her meter.  She checks her blood sugar first thing in AM before eating.   Her last 7 days range from 250 to 198.   She does not feel she has had any low blood sugar The only insulin she brings in is her Lantus - she can show me she dials 15 u and that she gives herself this each AM.  She nor her relatives know anything about meal time coverage that is on her medication list.  TB medications INH and pyridoxine are on her medication list as prescribed by Dr Nehemiah Settle on 03/08/11.   I can not find any mention of them in his note from that day or in the Problem list  Review of Symptoms - see HPI  PMH - Smoking status noted.      Review of Systems     Objective:   Physical Exam  No acute distress Alert interactive Heart - Regular rate and rhythm.  No murmurs, gallops or rubs.    Lungs:  Normal respiratory effort, chest expands symmetrically. Lungs are clear to auscultation, no crackles or wheezes. Extremities:  No cyanosis, edema, or deformity noted with good range of motion of all major joints.   When I compress her upper or lower arms she describes tenderness.  When she moves her  shoulders and arms they do not seem to hurt her      Assessment & Plan:

## 2011-04-26 NOTE — Assessment & Plan Note (Signed)
Poorly contolled but patient was not taking 3 of her prescribed medications.   I asked her to restart her amlodipine and hctz.  Given her pulse was 56 on ECG today I would not treat with Coreg and removed this medication from her list.  Close follow up to ensure compliance and improvement

## 2011-04-26 NOTE — Assessment & Plan Note (Signed)
Not well controlled but according to her log book seems to be improving.  I am unsure if she is supposed to be using meal time coverage but given the language barrier and current confusion of her medications I am unsure if multiple daily injections of insulin is safe.  Might consider slow increase in lantus with a goal A1c of just below 8.   Since her trend is improving I did not change her current 15 u of Lantus

## 2011-04-26 NOTE — Assessment & Plan Note (Signed)
Chest Arm pain.  Although communication is difficult this does not seem to be anginal.  ECG is unchanged from 2011 both showing lateral T wave inversions.   My best estimate is that this is musculoskeletal and will treat with tylenol.  If persists may need cardiac evaluation

## 2011-05-10 ENCOUNTER — Encounter: Payer: Self-pay | Admitting: Family Medicine

## 2011-05-10 ENCOUNTER — Ambulatory Visit (INDEPENDENT_AMBULATORY_CARE_PROVIDER_SITE_OTHER): Payer: Self-pay | Admitting: Family Medicine

## 2011-05-10 VITALS — BP 158/82 | HR 80 | Temp 97.9°F | Ht 62.0 in | Wt 146.9 lb

## 2011-05-10 DIAGNOSIS — E1165 Type 2 diabetes mellitus with hyperglycemia: Secondary | ICD-10-CM

## 2011-05-10 DIAGNOSIS — E119 Type 2 diabetes mellitus without complications: Secondary | ICD-10-CM

## 2011-05-10 DIAGNOSIS — Z111 Encounter for screening for respiratory tuberculosis: Secondary | ICD-10-CM

## 2011-05-10 DIAGNOSIS — I10 Essential (primary) hypertension: Secondary | ICD-10-CM

## 2011-05-10 LAB — COMPREHENSIVE METABOLIC PANEL
AST: 18 U/L (ref 0–37)
Albumin: 4 g/dL (ref 3.5–5.2)
Alkaline Phosphatase: 64 U/L (ref 39–117)
BUN: 7 mg/dL (ref 6–23)
Potassium: 3.6 mEq/L (ref 3.5–5.3)
Sodium: 142 mEq/L (ref 135–145)
Total Bilirubin: 0.4 mg/dL (ref 0.3–1.2)

## 2011-05-10 MED ORDER — INSULIN GLARGINE 100 UNIT/ML ~~LOC~~ SOLN: 15.0000 [IU] | Freq: Every day | SUBCUTANEOUS | Status: DC

## 2011-05-10 MED ORDER — QUINAPRIL HCL 40 MG PO TABS: 40.0000 mg | ORAL_TABLET | Freq: Every day | ORAL | Status: DC

## 2011-05-10 MED ORDER — METFORMIN HCL 1000 MG PO TABS: 1000.0000 mg | ORAL_TABLET | Freq: Two times a day (BID) | ORAL | Status: DC

## 2011-05-10 MED ORDER — INSULIN LISPRO 100 UNIT/ML ~~LOC~~ SOLN: 3.0000 [IU] | Freq: Three times a day (TID) | SUBCUTANEOUS | Status: DC

## 2011-05-10 MED ORDER — AMLODIPINE BESYLATE 10 MG PO TABS: 10.0000 mg | ORAL_TABLET | Freq: Every day | ORAL | Status: DC

## 2011-05-10 MED ORDER — HYDROCHLOROTHIAZIDE 25 MG PO TABS: 25.0000 mg | ORAL_TABLET | Freq: Every day | ORAL | Status: DC

## 2011-05-10 NOTE — Progress Notes (Signed)
  Subjective:    Patient ID: Kimberly Lane, female    DOB: Jun 25, 1960, 51 y.o.   MRN: [id number]  HPI This is a 51 year old [name] speaking female who is seen with her husband. Translation was provided by [facility] interpreters. She was seen for followup of her diabetes and hypertension. She did not bring her logbook today, though reports that her numbers are "normal".  She denies vision changes, polyuria, polydipsia.  She has been checking her blood sugar twice a day has been taking her insulin and metformin as prescribed. She reports no hypoglycemic episodes.  Her blood pressure was elevated today. She reports that she takes her blood pressures medications as prescribed. She denies headache, peripheral edema, shortness of breath, chest pain.  She is currently treated for TB with IZD.  She is unaware of her diagnosis.  In chart review there is no evidence of + PPD.  She does have negative chest xrays for TB. Review of Systems     Objective:   Physical Exam  Constitutional: She is oriented to person, place, and time. She appears well-developed and well-nourished.  Cardiovascular: Normal rate, regular rhythm and normal heart sounds.   Pulmonary/Chest: Effort normal and breath sounds normal. No respiratory distress. She has no wheezes. She has no rales. She exhibits no tenderness.  Abdominal: Soft. Bowel sounds are normal. She exhibits no distension and no mass. There is no tenderness. There is no rebound and no guarding.  Neurological: She is alert and oriented to person, place, and time.  Skin: Skin is warm and dry.  Psychiatric: She has a normal mood and affect. Her behavior is normal. Judgment and thought content normal.      Assessment & Plan:

## 2011-05-10 NOTE — Assessment & Plan Note (Signed)
Will evaluate CMP and TSH for secondary htn. Depending on renal function, may consider renal US to rule out RAS.

## 2011-05-10 NOTE — Assessment & Plan Note (Signed)
Will re-eval with PPD at next appt.

## 2011-05-10 NOTE — Assessment & Plan Note (Signed)
Will continue with medications and have patient return in 2 weeks with log book

## 2011-05-17 ENCOUNTER — Ambulatory Visit: Payer: Self-pay | Admitting: Family Medicine

## 2011-05-31 ENCOUNTER — Ambulatory Visit (INDEPENDENT_AMBULATORY_CARE_PROVIDER_SITE_OTHER): Payer: Self-pay | Admitting: Family Medicine

## 2011-05-31 ENCOUNTER — Encounter: Payer: Self-pay | Admitting: Family Medicine

## 2011-05-31 VITALS — BP 180/78 | HR 87 | Temp 98.3°F | Ht 62.0 in | Wt 147.0 lb

## 2011-05-31 DIAGNOSIS — I10 Essential (primary) hypertension: Secondary | ICD-10-CM

## 2011-05-31 DIAGNOSIS — E119 Type 2 diabetes mellitus without complications: Secondary | ICD-10-CM

## 2011-05-31 DIAGNOSIS — M169 Osteoarthritis of hip, unspecified: Secondary | ICD-10-CM

## 2011-05-31 DIAGNOSIS — E1165 Type 2 diabetes mellitus with hyperglycemia: Secondary | ICD-10-CM

## 2011-05-31 DIAGNOSIS — Z111 Encounter for screening for respiratory tuberculosis: Secondary | ICD-10-CM

## 2011-05-31 DIAGNOSIS — M1612 Unilateral primary osteoarthritis, left hip: Secondary | ICD-10-CM

## 2011-05-31 LAB — POCT GLYCOSYLATED HEMOGLOBIN (HGB A1C): Hemoglobin A1C: 9.8

## 2011-05-31 MED ORDER — HYDROCHLOROTHIAZIDE 25 MG PO TABS: 25.0000 mg | ORAL_TABLET | Freq: Every day | ORAL | Status: DC

## 2011-05-31 MED ORDER — INSULIN GLARGINE 100 UNIT/ML ~~LOC~~ SOLN: 30.0000 [IU] | Freq: Every day | SUBCUTANEOUS | Status: DC

## 2011-05-31 MED ORDER — AMLODIPINE BESYLATE 10 MG PO TABS: 10.0000 mg | ORAL_TABLET | Freq: Every day | ORAL | Status: DC

## 2011-05-31 MED ORDER — QUINAPRIL HCL 40 MG PO TABS: 40.0000 mg | ORAL_TABLET | Freq: Every day | ORAL | Status: DC

## 2011-05-31 MED ORDER — METFORMIN HCL 1000 MG PO TABS: 1000.0000 mg | ORAL_TABLET | Freq: Two times a day (BID) | ORAL | Status: DC

## 2011-05-31 MED ORDER — NAPROXEN 500 MG PO TABS: 500.0000 mg | ORAL_TABLET | Freq: Two times a day (BID) | ORAL | Status: DC | PRN

## 2011-05-31 NOTE — Assessment & Plan Note (Signed)
Will recheck ppd.  Placed today, patient understands to return on Friday morning to have read.

## 2011-05-31 NOTE — Assessment & Plan Note (Signed)
For simplicity, will eliminate meal time coverage and increase lantus to 30 units to cover.  HgA1c done today: 9.8, which is increased - likely as a cause of poor compliance.  Continue with metformin.

## 2011-05-31 NOTE — Assessment & Plan Note (Signed)
Will restart medications.
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
## 2011-05-31 NOTE — Assessment & Plan Note (Signed)
Naproxen prn prescribed.

## 2011-05-31 NOTE — Patient Instructions (Signed)
Please take medications.  Continue checking blood sugars. Increase Lantus to 30 units qhs.  Stop taking insulin with meals.  Restart blood pressure medications.

## 2011-06-03 ENCOUNTER — Ambulatory Visit (INDEPENDENT_AMBULATORY_CARE_PROVIDER_SITE_OTHER): Payer: Self-pay | Admitting: *Deleted

## 2011-06-03 DIAGNOSIS — R7611 Nonspecific reaction to tuberculin skin test without active tuberculosis: Secondary | ICD-10-CM

## 2011-06-03 LAB — TB SKIN TEST: Induration: 11

## 2011-06-03 NOTE — Progress Notes (Signed)
Received records from [facility] . Placed in MD box.

## 2011-06-03 NOTE — Progress Notes (Signed)
Patient in for PPD reading.  She does not have an interpretor.  PPD positive 11 X 15. [place] and spoke with Tammy. Was told that she has been thru the whole process for this thru the health dept. They will fax [place] some documentation about this.  Consulted with Dr. Verlon Au .   Patient is asking for needles for Lantus and and Humolog pen.  Spoke with Crystal from health dept pharmacy and she states they can give patient needles samples they have on hand. MAP does not pay for needles. Patient told to go to HD pharmacy.   It is stated in last note that patient is to stop Humolog and  increase Lantus to 30 units.  Called patient on phone and told to come back to office.  They came back . Paged Dr. Nehemiah Settle and he advises yes patient is to stop Humolog and increase Lantus to 30 units.  Called interpretor  again and she told patient to not take Humolog. I showed  box  to patient and she seems to understand. Wrote  across , Do Not Take.     Ask Dr. Valentina Lucks to come in on instruction. Also had patient show me how much lantus she is to take and she dialed to 14. Explained via intrepretor to dial to 30.  Patient demonstrated  for me and Dr. Valentina Lucks how she injects. She does this perfectly.  Then had her dial to 30 and she seems to understand.     Called Crystal at [facility] and they have a current RX for Lantus and ask her to reinforce  correct dosage when she comes in to pick up. They will dispose of humolog when patient goes there.

## 2011-07-05 ENCOUNTER — Ambulatory Visit: Payer: Self-pay | Admitting: Family Medicine

## 2011-09-05 ENCOUNTER — Ambulatory Visit (INDEPENDENT_AMBULATORY_CARE_PROVIDER_SITE_OTHER): Payer: Self-pay | Admitting: Family Medicine

## 2011-09-05 VITALS — BP 163/88 | HR 73 | Temp 98.2°F | Ht 62.0 in | Wt 147.0 lb

## 2011-09-05 DIAGNOSIS — M549 Dorsalgia, unspecified: Secondary | ICD-10-CM

## 2011-09-05 MED ORDER — NAPROXEN 500 MG PO TABS: 500.0000 mg | ORAL_TABLET | Freq: Two times a day (BID) | ORAL | Status: AC | PRN

## 2011-09-05 MED ORDER — KETOROLAC TROMETHAMINE 60 MG/2ML IM SOLN
60.0000 mg | Freq: Once | INTRAMUSCULAR | Status: AC
  Administered 2011-09-05: 60 mg via INTRAMUSCULAR

## 2011-09-05 MED ORDER — TRAMADOL HCL 50 MG PO TABS: 50.0000 mg | ORAL_TABLET | Freq: Three times a day (TID) | ORAL | Status: AC | PRN

## 2011-09-05 NOTE — Patient Instructions (Addendum)
Thank you for coming in today. From exam it does not appear that anything is broken.  For you pain please take  -tramadol 3 times daily as needed for pain. -take naproxen 2 x daily as needed for pain. Use heat for 20 minutes every 3-4 hours.   If your pain is not better in 1 week. Please come back for possible x-rays.   Dr. Adrian Blackwater

## 2011-09-05 NOTE — Assessment & Plan Note (Addendum)
R sided back and hip pain following fall on ipsilateral side. Suspect muscular pain. No evidence of acute fracture. No radicular  symptoms to suggest lumbar or sacral nerve irritation. Plan: -Toradol 60 IM x 1 dose today. -conservative management, pain control and f/u per AVS.  Tramadol 3 times daily as needed for pain. Naproxen 2 x daily as needed for pain. Use heat for 20 minutes every 3-4 hours.   If your pain is not better in 1 week. Please come back for possible x-rays. Recommend hip if pain maximally over hip or lumbar if patient develops radicular symptoms/pain maximally over low mid back.

## 2011-09-05 NOTE — Progress Notes (Signed)
Subjective:     Patient ID: Kimberly Lane, female   DOB: 1960-05-13, 51 y.o.   MRN: [id number]  HPI Patient is Kimberly Lane. She speaks Arabic. History obtained using sudanese- arabic interpreter # [phone].  51 yo F presents for R sided flank and gluteal pain following a fall down the steps three evenings ago. She reports difficulty sleeping and walking. She is able to bear weight. She denies fever and chills. She denies tingling or numbness in her extremities or groin. She denies fecal or urinary incontinence. She has not done or taken anything, including her home naproxen for the pain.   Medical History: HTN and Diabetes. Patient brought meds. Taking insulin and metformin for DM. Taking Norvasc and Accupril for HTN. She does not have HCTZ in her med bag.    Review of Systems As per HPI     Objective:   Physical Exam BP 163/88  Pulse 73  Temp 98.2 F (36.8 C) (Oral)  Ht 5\' 2"  (1.575 m)  Wt 147 lb (66.679 kg)  BMI 26.89 kg/m2 General appearance: alert, cooperative and mild distress during manipulation. None at rest.  Back: symmetric, no curvature. ROM normal. No CVA tenderness. TTP on R lumbar paraspinal and R hip. Negative FABER. 2+ patellar and achilles reflex bilaterally. Negative Babinski. Gait slow but normal.  Extremities: extremities normal, atraumatic, no cyanosis or edema Neurologic: Grossly normal  Assessment and Plan:

## 2011-09-23 ENCOUNTER — Ambulatory Visit (INDEPENDENT_AMBULATORY_CARE_PROVIDER_SITE_OTHER): Payer: Self-pay | Admitting: Family Medicine

## 2011-09-23 ENCOUNTER — Encounter: Payer: Self-pay | Admitting: Family Medicine

## 2011-09-23 VITALS — BP 163/78 | HR 77 | Temp 98.0°F | Ht 62.0 in | Wt 147.0 lb

## 2011-09-23 DIAGNOSIS — M25551 Pain in right hip: Secondary | ICD-10-CM

## 2011-09-23 DIAGNOSIS — M25559 Pain in unspecified hip: Secondary | ICD-10-CM

## 2011-09-23 DIAGNOSIS — M549 Dorsalgia, unspecified: Secondary | ICD-10-CM

## 2011-09-23 DIAGNOSIS — E1165 Type 2 diabetes mellitus with hyperglycemia: Secondary | ICD-10-CM

## 2011-09-23 LAB — GLUCOSE, CAPILLARY: Glucose-Capillary: 188 mg/dL — ABNORMAL HIGH (ref 70–99)

## 2011-09-23 MED ORDER — CYCLOBENZAPRINE HCL 5 MG PO TABS: 5.0000 mg | ORAL_TABLET | Freq: Two times a day (BID) | ORAL | Status: AC | PRN

## 2011-09-23 MED ORDER — LANCET DEVICES MISC: Status: DC

## 2011-09-23 NOTE — Assessment & Plan Note (Signed)
I agree with Dr. Adrian Blackwater who saw her on 07/15 and believe muscular strain is most likely. She may be having muscle spasms now on that side as well with mild radiculopathic symptoms -However, based on point tenderness and persistent symptoms, will check x-ray to rule-out fracture -Will stop Tramadol. Schedule naproxen and flexeril bid for 7 days then as needed. -Will refer to physical therapy  -Discussed how muscular problems take 4-6 weeks to improve.  -Follow-up in 2 weeks for re-evaluation. She and her husband speak Arabic only so closer follow-up than usual is probably advisable to confirm which medications they are on. They also need follow-up to discuss her diabetes. See below.

## 2011-09-23 NOTE — Patient Instructions (Signed)
Use the meloxicam (medication with star) and flexeril twice a day for 1 week then as needed.  Physical therapy.  Please get lancets and continue to check your sugars twice a day.  Write them down and bring them with you.   Follow-up in 2 weeks to re-asses hip and to discuss diabetes.

## 2011-09-23 NOTE — Assessment & Plan Note (Signed)
-  Rx for lancets given -Follow-up 2 weeks to discuss diabetes. Patient checks sugars twice daily. Advised to bring log at follow-up.

## 2011-09-23 NOTE — Progress Notes (Signed)
  Subjective:    Patient ID: Kimberly Lane, female    DOB: [birth year], 51 y.o.   MRN: [id number]  HPI # Follow-up: right-sided back pain  She was diagnosed with likely muscular strain after a fall down a few steps around 07/12. She was seen in clinic 07/15 and prescribed Tramado/naproxen/heating pads. She takes each medication once a day, and they help the pain. She uses heating pads sporadically and is not sure if they help. Progression: improving however still significant. Makes it difficult to stand > 10 minutes at a time Alleviated by: medications above Exacerbated by: standing/walking and laying on her right side ROS: denies changes in bowel/urine, denies numbness/tingling  She is concerned that her sugars are causing problems for her back  Review of Systems Per HPI  Allergies, medication, past medical history reviewed.  Significant for: -Poorly controlled diabetes (HgbA1c end of June low 9's). She used to check sugars twice a day but ran out of lancets.  -History of chest pain -History of osteoarthritis left hip    Objective:   Physical Exam GEN: appears uncomfortable with certain positions; well-nourished, -appearing CV: RRR PULM: NI WOB BACK: no mid-line tenderness   Point tenderness right SIJ area   Pelvic rock--pelvis appears intact however difficult to assess due to pain   ROM: abduction 3/5 strength (4/5 on left) due to pain   STR: positive R   Knee: no effusion  EXT: non-pitting pedal edema bilaterally; no calf pain     Assessment & Plan:

## 2011-10-04 ENCOUNTER — Ambulatory Visit: Payer: Self-pay | Attending: Family Medicine | Admitting: Rehabilitative and Restorative Service Providers"

## 2011-10-10 ENCOUNTER — Ambulatory Visit: Payer: Self-pay | Admitting: Family Medicine

## 2011-10-13 ENCOUNTER — Ambulatory Visit: Payer: Self-pay | Admitting: Physical Therapy

## 2011-10-25 ENCOUNTER — Ambulatory Visit: Payer: Self-pay | Attending: Family Medicine

## 2011-10-25 DIAGNOSIS — M545 Low back pain, unspecified: Secondary | ICD-10-CM | POA: Insufficient documentation

## 2011-10-25 DIAGNOSIS — IMO0001 Reserved for inherently not codable concepts without codable children: Secondary | ICD-10-CM | POA: Insufficient documentation

## 2011-10-25 DIAGNOSIS — M25559 Pain in unspecified hip: Secondary | ICD-10-CM | POA: Insufficient documentation

## 2011-10-25 DIAGNOSIS — M6281 Muscle weakness (generalized): Secondary | ICD-10-CM | POA: Insufficient documentation

## 2011-10-25 DIAGNOSIS — R262 Difficulty in walking, not elsewhere classified: Secondary | ICD-10-CM | POA: Insufficient documentation

## 2011-10-27 ENCOUNTER — Ambulatory Visit: Payer: Self-pay | Admitting: Rehabilitation

## 2011-11-03 ENCOUNTER — Ambulatory Visit: Payer: Self-pay | Admitting: Rehabilitative and Restorative Service Providers"

## 2011-11-07 ENCOUNTER — Ambulatory Visit: Payer: Self-pay | Admitting: Rehabilitation

## 2011-11-09 ENCOUNTER — Ambulatory Visit: Payer: Self-pay | Admitting: Rehabilitation

## 2011-11-14 ENCOUNTER — Encounter: Payer: Self-pay | Admitting: Rehabilitation

## 2011-11-16 ENCOUNTER — Encounter: Payer: Self-pay | Admitting: Rehabilitation

## 2011-11-21 ENCOUNTER — Encounter: Payer: Self-pay | Admitting: Rehabilitation

## 2011-11-23 ENCOUNTER — Encounter: Payer: Self-pay | Admitting: Rehabilitation

## 2012-01-04 ENCOUNTER — Encounter: Payer: Self-pay | Admitting: Home Health Services

## 2012-05-08 ENCOUNTER — Encounter: Payer: Self-pay | Admitting: Home Health Services

## 2012-08-30 ENCOUNTER — Other Ambulatory Visit: Payer: Self-pay

## 2012-10-04 ENCOUNTER — Telehealth: Payer: Self-pay | Admitting: *Deleted

## 2012-10-04 NOTE — Telephone Encounter (Signed)
Letter sent regarding diabetes follow up care Elizabeth Leandrea Ackley, RN-BSN   

## 2013-11-13 ENCOUNTER — Ambulatory Visit (INDEPENDENT_AMBULATORY_CARE_PROVIDER_SITE_OTHER): Payer: Self-pay | Admitting: Family Medicine

## 2013-11-13 ENCOUNTER — Encounter: Payer: Self-pay | Admitting: Family Medicine

## 2013-11-13 VITALS — BP 195/78 | HR 87 | Temp 98.6°F | Wt 137.3 lb

## 2013-11-13 DIAGNOSIS — Z8611 Personal history of tuberculosis: Secondary | ICD-10-CM

## 2013-11-13 DIAGNOSIS — I10 Essential (primary) hypertension: Secondary | ICD-10-CM

## 2013-11-13 DIAGNOSIS — IMO0001 Reserved for inherently not codable concepts without codable children: Secondary | ICD-10-CM

## 2013-11-13 DIAGNOSIS — IMO0002 Reserved for concepts with insufficient information to code with codable children: Secondary | ICD-10-CM

## 2013-11-13 DIAGNOSIS — E7800 Pure hypercholesterolemia, unspecified: Secondary | ICD-10-CM

## 2013-11-13 DIAGNOSIS — Z7289 Other problems related to lifestyle: Secondary | ICD-10-CM

## 2013-11-13 DIAGNOSIS — Z609 Problem related to social environment, unspecified: Secondary | ICD-10-CM | POA: Insufficient documentation

## 2013-11-13 DIAGNOSIS — E1165 Type 2 diabetes mellitus with hyperglycemia: Secondary | ICD-10-CM

## 2013-11-13 LAB — GLUCOSE, CAPILLARY: GLUCOSE-CAPILLARY: 244 mg/dL — AB (ref 70–99)

## 2013-11-13 LAB — POCT GLYCOSYLATED HEMOGLOBIN (HGB A1C): Hemoglobin A1C: 9.6

## 2013-11-13 MED ORDER — ASPIRIN 81 MG PO TABS: 81.0000 mg | ORAL_TABLET | Freq: Every day | ORAL | Status: DC

## 2013-11-13 MED ORDER — LISINOPRIL-HYDROCHLOROTHIAZIDE 20-12.5 MG PO TABS: 1.0000 | ORAL_TABLET | Freq: Every day | ORAL | Status: DC

## 2013-11-13 MED ORDER — METFORMIN HCL 1000 MG PO TABS: 1000.0000 mg | ORAL_TABLET | Freq: Two times a day (BID) | ORAL | Status: DC

## 2013-11-13 NOTE — Assessment & Plan Note (Addendum)
Restart metformin and ASA.  FU 1 week.  Creat OK for metformin.

## 2013-11-13 NOTE — Progress Notes (Signed)
   Subjective:    Patient ID: Kimberly Lane, female    DOB: 1960/08/05, 53 y.o.   MRN: [id number]  HPI Oh my, 53 yo Arabic only speaking woman who was seen in ER last night for high blood pressure and high blood sugar.  Multiple issues: Social:  States now lives alone.  Has no one to interpret English for her (Hence, no printed AVS) Cannot afford any new prescriptions. Compliance: She has not be taking any medications at all.  Apparently her prescriptions ran out sometime after her last visit with Dr. Verdie Drown (2013) and she treated those prescriptions as "done." Hypertension - on no meds. Diabetes - A1C= 9 (interestingly, about the same as when last checked and supposedly taking metformin and lantus)  Not on ASA  Denies Chest pain and shortness of breath.  Interview was conducted via interpreter line.    Review of Systems     Objective:   Physical ExamLungs clear Cardiac RRR without m or g Ext no peripheral edema.        Assessment & Plan:

## 2013-11-13 NOTE — Assessment & Plan Note (Addendum)
I did what I could today.  Next visit try to set up with Social worker and/or orange card.

## 2013-11-13 NOTE — Assessment & Plan Note (Addendum)
Did not know what pharmacy.  Given a written prescription for ACE/HCTZ combo, Check labs.  Urged to keep currently scheduled appointment with Dr. Skeet Simmer.  (9/30)  Will need second BMP at next visit to make sure no bump with creat.

## 2013-11-13 NOTE — Patient Instructions (Signed)
Not provided due to inability to speak English and no family available.

## 2013-11-14 DIAGNOSIS — E7800 Pure hypercholesterolemia, unspecified: Secondary | ICD-10-CM | POA: Insufficient documentation

## 2013-11-14 LAB — COMPREHENSIVE METABOLIC PANEL
ALT: 12 U/L (ref 0–35)
AST: 14 U/L (ref 0–37)
Albumin: 3.8 g/dL (ref 3.5–5.2)
Alkaline Phosphatase: 58 U/L (ref 39–117)
BILIRUBIN TOTAL: 0.5 mg/dL (ref 0.2–1.2)
BUN: 8 mg/dL (ref 6–23)
CALCIUM: 9.4 mg/dL (ref 8.4–10.5)
CHLORIDE: 101 meq/L (ref 96–112)
CO2: 29 meq/L (ref 19–32)
CREATININE: 0.56 mg/dL (ref 0.50–1.10)
GLUCOSE: 225 mg/dL — AB (ref 70–99)
Potassium: 3.8 mEq/L (ref 3.5–5.3)
SODIUM: 140 meq/L (ref 135–145)
TOTAL PROTEIN: 7.4 g/dL (ref 6.0–8.3)

## 2013-11-14 LAB — LIPID PANEL
CHOLESTEROL: 251 mg/dL — AB (ref 0–200)
HDL: 46 mg/dL (ref >39)
LDL Cholesterol: 170 mg/dL — ABNORMAL HIGH (ref 0–99)
TRIGLYCERIDES: 175 mg/dL — AB (ref <150)
Total CHOL/HDL Ratio: 5.5 Ratio
VLDL: 35 mg/dL (ref 0–40)

## 2013-11-14 LAB — CBC
HEMATOCRIT: 37.6 % (ref 36.0–46.0)
Hemoglobin: 12.9 g/dL (ref 12.0–15.0)
MCH: 26.7 pg (ref 26.0–34.0)
MCHC: 34.3 g/dL (ref 30.0–36.0)
MCV: 77.8 fL — AB (ref 78.0–100.0)
Platelets: 302 10*3/uL (ref 150–400)
RBC: 4.83 MIL/uL (ref 3.87–5.11)
RDW: 14.3 % (ref 11.5–15.5)
WBC: 6.4 10*3/uL (ref 4.0–10.5)

## 2013-11-14 NOTE — Progress Notes (Signed)
A referral has been sent to [employer], to provide assistance in navigating resources/providing education to manage pt's uncontrolled DM.  Hunt Oris, MSW, [place]

## 2013-11-14 NOTE — Assessment & Plan Note (Signed)
Needs statin.  

## 2013-11-19 ENCOUNTER — Telehealth: Payer: Self-pay | Admitting: Clinical

## 2013-11-19 NOTE — Telephone Encounter (Signed)
CSW received a call from [place] with [facility] regarding the referral CSW sent. CSW informed that [facility] is familiar with this pt as they worked with her in 2012 & 2013. Pt at that time was non compliant and appeared to have the same obstacles she has now. CSW also informed that pt will need to have an active orange card in order for [facility] to provide services. Shawna Orleans has agreed to contact the [employer] to see if they could assist pt in gathering the necessary documentation for the orange card.  Hunt Oris, MSW, [place]

## 2013-11-20 ENCOUNTER — Ambulatory Visit: Payer: Self-pay | Admitting: Family Medicine

## 2013-12-18 ENCOUNTER — Ambulatory Visit (INDEPENDENT_AMBULATORY_CARE_PROVIDER_SITE_OTHER): Payer: Self-pay | Admitting: Family Medicine

## 2013-12-18 ENCOUNTER — Encounter: Payer: Self-pay | Admitting: Family Medicine

## 2013-12-18 VITALS — BP 164/99 | HR 106 | Temp 98.6°F | Wt 133.0 lb

## 2013-12-18 DIAGNOSIS — IMO0002 Reserved for concepts with insufficient information to code with codable children: Secondary | ICD-10-CM

## 2013-12-18 DIAGNOSIS — I10 Essential (primary) hypertension: Secondary | ICD-10-CM

## 2013-12-18 DIAGNOSIS — E1165 Type 2 diabetes mellitus with hyperglycemia: Secondary | ICD-10-CM

## 2013-12-18 DIAGNOSIS — Z79899 Other long term (current) drug therapy: Secondary | ICD-10-CM

## 2013-12-18 LAB — BASIC METABOLIC PANEL
BUN: 9 mg/dL (ref 6–23)
CHLORIDE: 95 meq/L — AB (ref 96–112)
CO2: 32 mEq/L (ref 19–32)
Calcium: 10.2 mg/dL (ref 8.4–10.5)
Creat: 0.58 mg/dL (ref 0.50–1.10)
Glucose, Bld: 206 mg/dL — ABNORMAL HIGH (ref 70–99)
Potassium: 3.4 mEq/L — ABNORMAL LOW (ref 3.5–5.3)
Sodium: 138 mEq/L (ref 135–145)

## 2013-12-18 MED ORDER — LOSARTAN POTASSIUM-HCTZ 50-12.5 MG PO TABS: 1.0000 | ORAL_TABLET | Freq: Every day | ORAL | Status: DC

## 2013-12-18 MED ORDER — GABAPENTIN 100 MG PO CAPS: 100.0000 mg | ORAL_CAPSULE | Freq: Three times a day (TID) | ORAL | Status: DC

## 2013-12-18 NOTE — Assessment & Plan Note (Signed)
Not at goal per last A1C Will cont metformin ASA Potential claudication sx Start gabapentin  May need ABI testing

## 2013-12-18 NOTE — Progress Notes (Signed)
Patient ID: Kimberly Lane, female   DOB: 1960-04-11, 53 y.o.   MRN: [id number]   [facility] [name], MD Phone: [phone]  Subjective:   # HTN -supposed to be taking ACE/HCTZ, given to her at last visit  -reports 100% compliance -has occasional palpitations, headaches  -has cough at night time   #DM -told at last visit to restat metformin (morning and evening) and ASA -needs repeat BMET for creatinine   #leg pain -describes bilat leg pain with questionable discoloration of legs -pain is sharp and constant, increases with walking -also with numbness and tingling in her feet  -left leg worse than right; limits physical activity, when she stops walking pain will go away   All relevant systems were reviewed and were negative unless otherwise noted in the HPI  Past Medical History Reviewed problem list.  Medications- reviewed and updated Current Outpatient Prescriptions  Medication Sig Dispense Refill  . aspirin 81 MG tablet Take 1 tablet (81 mg total) by mouth daily.  100 tablet  0  . Insulin Syringe-Needle U-100 (B-D INS SYR MICROFINE .3CC/28G) 28G X 1/2" 0.3 ML MISC 1 Units by Does not apply route at bedtime.  100 each  0  . isoniazid (NYDRAZID) 100 MG tablet Take 1 tablet (100 mg total) by mouth daily.  30 tablet  11  . lisinopril-hydrochlorothiazide (ZESTORETIC) 20-12.5 MG per tablet Take 1 tablet by mouth daily.  30 tablet  12  . metFORMIN (GLUCOPHAGE) 1000 MG tablet Take 1 tablet (1,000 mg total) by mouth 2 (two) times daily with a meal.  60 tablet  11  . pyridOXINE (VITAMIN B-6) 25 MG tablet Take 1 tablet (25 mg total) by mouth daily.  30 tablet  11   No current facility-administered medications for this visit.   Chief complaint-noted No additions to family history Social history- patient is a never smoker  Objective: BP 164/99  Pulse 106  Temp(Src) 98.6 F (37 C) (Oral)  Wt 133 lb (60.328 kg) Gen: NAD, alert, cooperative with exam CV:  RRR Ext: No edema, warm, normal tone, moves UE/LE spontaneously; questionable skin changes?  Assessment/Plan: See problem based a/p

## 2013-12-18 NOTE — Patient Instructions (Addendum)
[name], It was great to meet you today  Please continue to take your metformin and aspirin   Please switch your blood pressure medicine to hyzaar  Start gabapentin to help with nerve pain  I will let you know if your kidney function looks abnormal  See you in a few weeks [name]

## 2013-12-18 NOTE — Assessment & Plan Note (Signed)
Not at goal, Questionable reaction to lisinopril Will switch to hyzaar BMET today

## 2013-12-19 ENCOUNTER — Encounter: Payer: Self-pay | Admitting: Family Medicine

## 2014-01-15 ENCOUNTER — Ambulatory Visit (INDEPENDENT_AMBULATORY_CARE_PROVIDER_SITE_OTHER): Payer: Self-pay | Admitting: Family Medicine

## 2014-01-15 ENCOUNTER — Telehealth: Payer: Self-pay | Admitting: *Deleted

## 2014-01-15 ENCOUNTER — Encounter: Payer: Self-pay | Admitting: Family Medicine

## 2014-01-15 VITALS — BP 195/94 | HR 79 | Temp 97.7°F | Ht 62.0 in | Wt 129.0 lb

## 2014-01-15 DIAGNOSIS — Z7289 Other problems related to lifestyle: Secondary | ICD-10-CM

## 2014-01-15 DIAGNOSIS — E7800 Pure hypercholesterolemia, unspecified: Secondary | ICD-10-CM

## 2014-01-15 DIAGNOSIS — I10 Essential (primary) hypertension: Secondary | ICD-10-CM

## 2014-01-15 DIAGNOSIS — Z609 Problem related to social environment, unspecified: Secondary | ICD-10-CM

## 2014-01-15 MED ORDER — ACETAMINOPHEN 500 MG PO TABS: 500.0000 mg | ORAL_TABLET | Freq: Four times a day (QID) | ORAL | Status: DC | PRN

## 2014-01-15 MED ORDER — ATORVASTATIN CALCIUM 20 MG PO TABS
20.0000 mg | ORAL_TABLET | Freq: Every day | ORAL | Status: DC
Start: 2014-01-15 — End: 2014-05-13

## 2014-01-15 MED ORDER — AMLODIPINE BESYLATE 5 MG PO TABS: 5.0000 mg | ORAL_TABLET | Freq: Every day | ORAL | Status: DC

## 2014-01-15 NOTE — Assessment & Plan Note (Signed)
Instructed pt to continue to keep apptmt for orange card on Dec 1st (reviewed what documents she would need with Education officer, museum) Given notes printed for evidence of her medical diseases

## 2014-01-15 NOTE — Progress Notes (Signed)
Patient ID: Kimberly Lane, female   DOB: September 06, 1960, 53 y.o.   MRN: [id number]   [facility] [name], MD Phone: [phone]  Subjective:  Kimberly Lane is a 53 y.o F who presents today for f/up   Interpretor is: Wendee Beavers   #HTN -switched to hyzaar at last visit  -now having headache- has not been taking any medication  -cough is improved after taking off lisinopril -denies chest pain, SOB or palpitations, no vision changes   #DM -cont metformin and ASA -started gabapentin -concern for ABI testing    All relevant systems were reviewed and were negative unless otherwise noted in the HPI  Past Medical History Reviewed problem list.  Medications- reviewed and updated Current Outpatient Prescriptions  Medication Sig Dispense Refill  . aspirin 81 MG tablet Take 1 tablet (81 mg total) by mouth daily. 100 tablet 0  . gabapentin (NEURONTIN) 100 MG capsule Take 1 capsule (100 mg total) by mouth 3 (three) times daily. 90 capsule 3  . Insulin Syringe-Needle U-100 (B-D INS SYR MICROFINE .3CC/28G) 28G X 1/2" 0.3 ML MISC 1 Units by Does not apply route at bedtime. 100 each 0  . isoniazid (NYDRAZID) 100 MG tablet Take 1 tablet (100 mg total) by mouth daily. 30 tablet 11  . losartan-hydrochlorothiazide (HYZAAR) 50-12.5 MG per tablet Take 1 tablet by mouth daily. 30 tablet 3  . metFORMIN (GLUCOPHAGE) 1000 MG tablet Take 1 tablet (1,000 mg total) by mouth 2 (two) times daily with a meal. 60 tablet 11  . pyridOXINE (VITAMIN B-6) 25 MG tablet Take 1 tablet (25 mg total) by mouth daily. 30 tablet 11   No current facility-administered medications for this visit.   Chief complaint-noted No additions to family history Social history- patient is a never smoker  Objective: BP 195/94 mmHg  Pulse 79  Temp(Src) 97.7 F (36.5 C) (Oral)  Ht 5\' 2"  (1.575 m)  Wt 129 lb (58.514 kg)  BMI 23.59 kg/m2 Gen: NAD, alert, cooperative with exam Ext: No edema, warm, normal tone, moves  UE/LE spontaneously, points to tenderness over the calves; no joint line tenderness (medially or laterally) Neuro: Alert and oriented, No gross deficits Skin: no rashes no lesions  Assessment/Plan: See problem based a/p

## 2014-01-15 NOTE — Assessment & Plan Note (Signed)
Started on Ator 20  Hopefully will help with claudication sx Will send for ABI testing with Dr. Valentina Lucks

## 2014-01-15 NOTE — Telephone Encounter (Signed)
Pt was here for an appt today and was Rx'd amlodapine, atorvastatin and tylenol.  States that she does not have money to pick up Rx's.    Since pt does not have Orange card (appt 01/21/14) at this time she can not go to the health department to pick up medications.  Pt does not speak english (interpretor present) so she is unable to tell me if she lives near a [place] or other pharmacy that have $4 list.  [facility] and spoke with Janett Billow.  She will be able to provide pt with a 30 days supply of meds for $20.   Pt is aware that this is a one time action and encouraged to keep appt for orange card on 01/21/14.  Atorvastatin - $9 Amlodapine - $9 Tylenol - $1.29

## 2014-01-15 NOTE — Patient Instructions (Signed)
For your blood pressure  Please add amlodipine 5mg  to hyzaar daily  Please start a cholesterol medication- atorvastatin 20mg   You can use tylenol for your head pain and joint pain  Ask the front desk for an appointment for ABI testing with Dr. Valentina Lucks   See you again in 1-2 months [name], MD

## 2014-01-15 NOTE — Assessment & Plan Note (Signed)
Suspect cause of her headaches Cont hyzaar (cough better off ACE) Add amlodipine Money given X1 for medication cost F/up in 1 month

## 2014-01-21 ENCOUNTER — Ambulatory Visit: Payer: Self-pay

## 2014-05-06 ENCOUNTER — Emergency Department (HOSPITAL_COMMUNITY)
Admission: EM | Admit: 2014-05-06 | Discharge: 2014-05-06 | Disposition: A | Discharge status: Home / Self Care | Payer: No Typology Code available for payment source | Source: Home / Self Care | Attending: Family Medicine | Admitting: Family Medicine

## 2014-05-06 ENCOUNTER — Emergency Department (INDEPENDENT_AMBULATORY_CARE_PROVIDER_SITE_OTHER): Payer: No Typology Code available for payment source

## 2014-05-06 ENCOUNTER — Encounter (HOSPITAL_COMMUNITY): Payer: Self-pay | Admitting: Emergency Medicine

## 2014-05-06 DIAGNOSIS — R69 Illness, unspecified: Principal | ICD-10-CM

## 2014-05-06 DIAGNOSIS — J111 Influenza due to unidentified influenza virus with other respiratory manifestations: Secondary | ICD-10-CM

## 2014-05-06 MED ORDER — DICLOFENAC SODIUM 1 % TD GEL: 4.0000 g | Freq: Four times a day (QID) | TRANSDERMAL | Status: DC

## 2014-05-06 MED ORDER — GUAIFENESIN-CODEINE 100-10 MG/5ML PO SYRP: 10.0000 mL | ORAL_SOLUTION | Freq: Four times a day (QID) | ORAL | Status: DC | PRN

## 2014-05-06 NOTE — ED Provider Notes (Signed)
CSN: [id number]     Arrival date & time 05/06/14  1246 History   First MD Initiated Contact with Patient 05/06/14 1544     Chief Complaint  Patient presents with  . Cough   (Consider location/radiation/quality/duration/timing/severity/associated sxs/prior Treatment) Patient is a 54 y.o. female presenting with cough. The history is provided by the patient.  Cough Cough characteristics:  Non-productive, dry and harsh Severity:  Moderate Onset quality:  Gradual Duration:  3 days Progression:  Unchanged Chronicity:  New Smoker: no   Context: sick contacts and upper respiratory infection   Associated symptoms: fever and rhinorrhea   Associated symptoms: no shortness of breath, no sore throat and no wheezing     Past Medical History  Diagnosis Date  . Diabetes mellitus   . Hypertension    History reviewed. No pertinent past surgical history. No family history on file. History  Substance Use Topics  . Smoking status: Never Smoker   . Smokeless tobacco: Not on file  . Alcohol Use: No   OB History    No data available     Review of Systems  Constitutional: Positive for fever.  HENT: Positive for congestion, postnasal drip and rhinorrhea. Negative for sore throat.   Respiratory: Positive for cough. Negative for shortness of breath and wheezing.   Cardiovascular: Negative.   Gastrointestinal: Positive for nausea and vomiting.  Genitourinary: Negative.   Musculoskeletal: Negative.     Allergies  Ace inhibitors  Home Medications   Prior to Admission medications   Medication Sig Start Date End Date Taking? Authorizing Provider  acetaminophen (TYLENOL) 500 MG tablet Take 1 tablet (500 mg total) by mouth every 6 (six) hours as needed. 01/15/14   [name], MD  amLODipine (NORVASC) 5 MG tablet Take 1 tablet (5 mg total) by mouth daily. 01/15/14   [name], MD  aspirin 81 MG tablet Take 1 tablet (81 mg total) by mouth daily. 11/13/13   Zenia Resides, MD   atorvastatin (LIPITOR) 20 MG tablet Take 1 tablet (20 mg total) by mouth daily. 01/15/14   [name], MD  diclofenac sodium (VOLTAREN) 1 % GEL Apply 4 g topically 4 (four) times daily. For shoulder pain 05/06/14   Billy Fischer, MD  gabapentin (NEURONTIN) 100 MG capsule Take 1 capsule (100 mg total) by mouth 3 (three) times daily. 12/18/13   [name], MD  guaiFENesin-codeine (ROBITUSSIN AC) 100-10 MG/5ML syrup Take 10 mLs by mouth 4 (four) times daily as needed for cough. 05/06/14   Billy Fischer, MD  Insulin Syringe-Needle U-100 (B-D INS SYR MICROFINE .3CC/28G) 28G X 1/2" 0.3 ML MISC 1 Units by Does not apply route at bedtime. 03/08/11   Truett Mainland, DO  losartan-hydrochlorothiazide (HYZAAR) 50-12.5 MG per tablet Take 1 tablet by mouth daily. 12/18/13   [name], MD  metFORMIN (GLUCOPHAGE) 1000 MG tablet Take 1 tablet (1,000 mg total) by mouth 2 (two) times daily with a meal. 11/13/13   Zenia Resides, MD  pyridOXINE (VITAMIN B-6) 25 MG tablet Take 1 tablet (25 mg total) by mouth daily. 03/08/11   Tanna Savoy Stinson, DO   BP 135/70 mmHg  Pulse 77  Temp(Src) 98.8 F (37.1 C) (Oral)  Resp 12  SpO2 100% Physical Exam  Constitutional: She is oriented to person, place, and time. She appears well-developed and well-nourished. No distress.  HENT:  Right Ear: External ear normal.  Left Ear: External ear normal.  Mouth/Throat: Oropharynx is clear  and moist.  Eyes: Pupils are equal, round, and reactive to light. Right eye exhibits discharge.  Neck: Normal range of motion. Neck supple.  Cardiovascular: Normal heart sounds and intact distal pulses.   Pulmonary/Chest: Effort normal and breath sounds normal.  Abdominal: Soft. Bowel sounds are normal. She exhibits no mass. There is no tenderness.  Lymphadenopathy:    She has no cervical adenopathy.  Neurological: She is alert and oriented to person, place, and time.  Skin: Skin is warm and dry.  Nursing note and vitals  reviewed.   ED Course  Procedures (including critical care time) Labs Review Labs Reviewed - No data to display  Imaging Review Dg Chest 2 View  05/06/2014   CLINICAL DATA:  Cough and fever for several days  EXAM: CHEST  2 VIEW  COMPARISON:  May 04, 2010  FINDINGS: There is no edema or consolidation. Heart is upper normal in size with pulmonary vascularity within normal limits. No adenopathy. There is degenerative change in the lower thoracic spine.  IMPRESSION: No edema or consolidation.   Electronically Signed   By: Lowella Grip III M.D.   On: 05/06/2014 16:37   X-rays reviewed and report per radiologist.   MDM   1. Influenza-like illness        Billy Fischer, MD 05/06/14 [id number]

## 2014-05-06 NOTE — Discharge Instructions (Signed)
Drink plenty of fluids as discussed, use medicine as prescribed, and mucinex or delsym for cough. Return or see your doctor if further problems °

## 2014-05-06 NOTE — ED Notes (Signed)
Cough, red eyes, fever, vomiting for 2 days.  Right shoulder pain for 10 days.

## 2014-05-08 ENCOUNTER — Emergency Department (HOSPITAL_COMMUNITY)
Admission: EM | Admit: 2014-05-08 | Discharge: 2014-05-09 | Disposition: A | Discharge status: Home / Self Care | Payer: No Typology Code available for payment source | Attending: Emergency Medicine | Admitting: Emergency Medicine

## 2014-05-08 ENCOUNTER — Encounter (HOSPITAL_COMMUNITY): Payer: Self-pay | Admitting: Emergency Medicine

## 2014-05-08 ENCOUNTER — Emergency Department (INDEPENDENT_AMBULATORY_CARE_PROVIDER_SITE_OTHER)
Admission: EM | Admit: 2014-05-08 | Discharge: 2014-05-08 | Disposition: A | Discharge status: Nursing Home (Includes ICF) | Payer: No Typology Code available for payment source | Source: Home / Self Care | Attending: Family Medicine | Admitting: Family Medicine

## 2014-05-08 DIAGNOSIS — T798XXA Other early complications of trauma, initial encounter: Secondary | ICD-10-CM

## 2014-05-08 DIAGNOSIS — I10 Essential (primary) hypertension: Secondary | ICD-10-CM | POA: Insufficient documentation

## 2014-05-08 DIAGNOSIS — Z794 Long term (current) use of insulin: Secondary | ICD-10-CM | POA: Insufficient documentation

## 2014-05-08 DIAGNOSIS — R1033 Periumbilical pain: Secondary | ICD-10-CM | POA: Insufficient documentation

## 2014-05-08 DIAGNOSIS — Z7982 Long term (current) use of aspirin: Secondary | ICD-10-CM | POA: Insufficient documentation

## 2014-05-08 DIAGNOSIS — L089 Local infection of the skin and subcutaneous tissue, unspecified: Secondary | ICD-10-CM | POA: Insufficient documentation

## 2014-05-08 DIAGNOSIS — E118 Type 2 diabetes mellitus with unspecified complications: Secondary | ICD-10-CM

## 2014-05-08 DIAGNOSIS — E119 Type 2 diabetes mellitus without complications: Secondary | ICD-10-CM | POA: Insufficient documentation

## 2014-05-08 DIAGNOSIS — Z79899 Other long term (current) drug therapy: Secondary | ICD-10-CM | POA: Insufficient documentation

## 2014-05-08 LAB — CBC
HCT: 35.9 % — ABNORMAL LOW (ref 36.0–46.0)
HEMOGLOBIN: 11.9 g/dL — AB (ref 12.0–15.0)
MCH: 27 pg (ref 26.0–34.0)
MCHC: 33.1 g/dL (ref 30.0–36.0)
MCV: 81.6 fL (ref 78.0–100.0)
Platelets: 371 10*3/uL (ref 150–400)
RBC: 4.4 MIL/uL (ref 3.87–5.11)
RDW: 12.7 % (ref 11.5–15.5)
WBC: 10.1 10*3/uL (ref 4.0–10.5)

## 2014-05-08 LAB — COMPREHENSIVE METABOLIC PANEL
ALT: 11 U/L (ref 0–35)
AST: 13 U/L (ref 0–37)
Albumin: 3.4 g/dL — ABNORMAL LOW (ref 3.5–5.2)
Alkaline Phosphatase: 74 U/L (ref 39–117)
Anion gap: 10 (ref 5–15)
BILIRUBIN TOTAL: 0.3 mg/dL (ref 0.3–1.2)
BUN: 11 mg/dL (ref 6–23)
CHLORIDE: 104 mmol/L (ref 96–112)
CO2: 27 mmol/L (ref 19–32)
Calcium: 9.5 mg/dL (ref 8.4–10.5)
Creatinine, Ser: 0.68 mg/dL (ref 0.50–1.10)
GFR calc non Af Amer: >90 mL/min (ref >90)
GLUCOSE: 113 mg/dL — AB (ref 70–99)
POTASSIUM: 4.1 mmol/L (ref 3.5–5.1)
Sodium: 141 mmol/L (ref 135–145)
Total Protein: 7.8 g/dL (ref 6.0–8.3)

## 2014-05-08 LAB — I-STAT CG4 LACTIC ACID, ED: Lactic Acid, Venous: 1.52 mmol/L (ref 0.5–2.0)

## 2014-05-08 NOTE — ED Notes (Signed)
Patient was sent here from urgent care MD for concern of possible infection of bone.

## 2014-05-08 NOTE — ED Notes (Signed)
Patient speaks [name] Arabic. The following triage completed using pacific interpreter phone service. Patient reports right 5th toe pain starting about 15 days ago. Base of smallest toe has apparent crack in skin with purulent drainage at site. Toe additionally appears darkened compared to sole of other toes. Patient reports pain extending into foot and ankle. Tenderness on palpation in toe, foot, and ankle. Skin tone makes color assessment of foot difficult. Patient is diabetic. A nurse who helps patient at home is accompanying and reports that when checked patient CBG levels >350.

## 2014-05-08 NOTE — ED Notes (Signed)
See providers note, patient is using an interpreter line for [place].

## 2014-05-08 NOTE — ED Provider Notes (Signed)
Kimberly Lane is a 54 y.o. female who presents to Urgent Care today for right foot injury. Patient stepped her right fifth toe about 2 weeks ago suffered a laceration of the plantar aspect. 5 days ago she wore closed toed shoes and suffered worsening pain. The pain has been worsening incrementally for the last 5 days. She is having difficulty ambulating because the pain has become so severe. She denies any current fevers or vomiting. She has a history of diabetes. She speaks [name] Arabic.   Past Medical History  Diagnosis Date  . Diabetes mellitus   . Hypertension    No past surgical history on file. History  Substance Use Topics  . Smoking status: Never Smoker   . Smokeless tobacco: Not on file  . Alcohol Use: No   ROS as above Medications: No current facility-administered medications for this encounter.   Current Outpatient Prescriptions  Medication Sig Dispense Refill  . acetaminophen (TYLENOL) 500 MG tablet Take 1 tablet (500 mg total) by mouth every 6 (six) hours as needed. 30 tablet 0  . amLODipine (NORVASC) 5 MG tablet Take 1 tablet (5 mg total) by mouth daily. 90 tablet 3  . aspirin 81 MG tablet Take 1 tablet (81 mg total) by mouth daily. 100 tablet 0  . atorvastatin (LIPITOR) 20 MG tablet Take 1 tablet (20 mg total) by mouth daily. 90 tablet 3  . diclofenac sodium (VOLTAREN) 1 % GEL Apply 4 g topically 4 (four) times daily. For shoulder pain 3 Tube 0  . gabapentin (NEURONTIN) 100 MG capsule Take 1 capsule (100 mg total) by mouth 3 (three) times daily. 90 capsule 3  . guaiFENesin-codeine (ROBITUSSIN AC) 100-10 MG/5ML syrup Take 10 mLs by mouth 4 (four) times daily as needed for cough. 180 mL 0  . Insulin Syringe-Needle U-100 (B-D INS SYR MICROFINE .3CC/28G) 28G X 1/2" 0.3 ML MISC 1 Units by Does not apply route at bedtime. 100 each 0  . losartan-hydrochlorothiazide (HYZAAR) 50-12.5 MG per tablet Take 1 tablet by mouth daily. 30 tablet 3  . metFORMIN (GLUCOPHAGE) 1000 MG tablet  Take 1 tablet (1,000 mg total) by mouth 2 (two) times daily with a meal. 60 tablet 11  . pyridOXINE (VITAMIN B-6) 25 MG tablet Take 1 tablet (25 mg total) by mouth daily. 30 tablet 11   Allergies  Allergen Reactions  . Ace Inhibitors     cough     Exam:  BP 155/76 mmHg  Pulse 70  Temp(Src) 97.5 F (36.4 C) (Oral)  Resp 20  SpO2 95% Gen: Well NAD Right fifth toe extremely tender with plantar laceration with discharge. Gait: Antalgic  No results found for this or any previous visit (from the past 24 hour(s)). No results found.  Assessment and Plan: 54 y.o. female with diabetic wound infection. Concern for cellulitis and osteomyelitis. Transfer to ED for further evaluation and management.  Visit conducted using a [name] Arabic interpreter.  Discussed warning signs or symptoms. Please see discharge instructions. Patient expresses understanding.     Gregor Hams, MD 05/08/14 2150

## 2014-05-09 ENCOUNTER — Emergency Department (HOSPITAL_COMMUNITY): Payer: No Typology Code available for payment source

## 2014-05-09 LAB — CBG MONITORING, ED: Glucose-Capillary: 112 mg/dL — ABNORMAL HIGH (ref 70–99)

## 2014-05-09 LAB — LIPASE, BLOOD: Lipase: 78 U/L — ABNORMAL HIGH (ref 11–59)

## 2014-05-09 MED ORDER — HYDROCODONE-ACETAMINOPHEN 5-325 MG PO TABS
1.0000 | ORAL_TABLET | Freq: Once | ORAL | Status: AC
  Administered 2014-05-09: 1 via ORAL
  Filled 2014-05-09: qty 1

## 2014-05-09 MED ORDER — HYDROCODONE-ACETAMINOPHEN 5-325 MG PO TABS: 1.0000 | ORAL_TABLET | ORAL | Status: DC | PRN

## 2014-05-09 MED ORDER — SULFAMETHOXAZOLE-TRIMETHOPRIM 800-160 MG PO TABS: 1.0000 | ORAL_TABLET | Freq: Two times a day (BID) | ORAL | Status: DC

## 2014-05-09 NOTE — Discharge Instructions (Signed)
Abdominal Pain Many things can cause abdominal pain. Usually, abdominal pain is not caused by a disease and will improve without treatment. It can often be observed and treated at home. Your health care provider will do a physical exam and possibly order blood tests and X-rays to help determine the seriousness of your pain. However, in many cases, more time must pass before a clear cause of the pain can be found. Before that point, your health care provider may not know if you need more testing or further treatment. HOME CARE INSTRUCTIONS  Monitor your abdominal pain for any changes. The following actions may help to alleviate any discomfort you are experiencing:  Only take over-the-counter or prescription medicines as directed by your health care provider.  Do not take laxatives unless directed to do so by your health care provider.  Try a clear liquid diet (broth, tea, or water) as directed by your health care provider. Slowly move to a bland diet as tolerated. SEEK MEDICAL CARE IF:  You have unexplained abdominal pain.  You have abdominal pain associated with nausea or diarrhea.  You have pain when you urinate or have a bowel movement.  You experience abdominal pain that wakes you in the night.  You have abdominal pain that is worsened or improved by eating food.  You have abdominal pain that is worsened with eating fatty foods.  You have a fever. SEEK IMMEDIATE MEDICAL CARE IF:   Your pain does not go away within 2 hours.  You keep throwing up (vomiting).  Your pain is felt only in portions of the abdomen, such as the right side or the left lower portion of the abdomen.  You pass bloody or black tarry stools. MAKE SURE YOU:  Understand these instructions.   Will watch your condition.   Will get help right away if you are not doing well or get worse.  Document Released: 11/17/2004 Document Revised: 02/12/2013 Document Reviewed: 10/17/2012 [facility] Patient  Information 2015 [place], [place]. This information is not intended to replace advice given to you by your health care provider. Make sure you discuss any questions you have with your health care provider. Wound Infection A wound infection happens when a type of germ (bacteria) starts growing in the wound. In some cases, this can cause the wound to break open. If cared for properly, the infected wound will heal from the inside to the outside. Wound infections need treatment. CAUSES An infection is caused by bacteria growing in the wound.  SYMPTOMS   Increase in redness, swelling, or pain at the wound site.  Increase in drainage at the wound site.  Wound or bandage (dressing) starts to smell bad.  Fever.  Feeling tired or fatigued.  Pus draining from the wound. TREATMENT  Your health care provider will prescribe antibiotic medicine. The wound infection should improve within 24 to 48 hours. Any redness around the wound should stop spreading and the wound should be less painful.  HOME CARE INSTRUCTIONS   Only take over-the-counter or prescription medicines for pain, discomfort, or fever as directed by your health care provider.  Take your antibiotics as directed. Finish them even if you start to feel better.  Gently wash the area with mild soap and water 2 times a day, or as directed. Rinse off the soap. Pat the area dry with a clean towel. Do not rub the wound. This may cause bleeding.  Follow your health care provider's instructions for how often you need to change the dressing.  Apply ointment and a dressing to the wound as directed.  If the dressing sticks, moisten it with soapy water and gently remove it.  Change the bandage right away if it becomes wet, dirty, or develops a bad smell.  Take showers. Do not take tub baths, swim, or do anything that may soak the wound until it is healed.  Avoid exercises that make you sweat heavily.  Use anti-itch medicine as directed by your  health care provider. The wound may itch when it is healing. Do not pick or scratch at the wound.  Follow up with your health care provider to get your wound rechecked as directed. SEEK MEDICAL CARE IF:  You have an increase in swelling, pain, or redness around the wound.  You have an increase in the amount of pus coming from the wound.  There is a bad smell coming from the wound.  More of the wound breaks open.  You have a fever. MAKE SURE YOU:   Understand these instructions.  Will watch your condition.  Will get help right away if you are not doing well or get worse. Document Released: 11/06/2002 Document Revised: 02/12/2013 Document Reviewed: 06/13/2010 [facility] Patient Information 2015 [place], [place]. This information is not intended to replace advice given to you by your health care provider. Make sure you discuss any questions you have with your health care provider.

## 2014-05-10 NOTE — ED Provider Notes (Signed)
CSN: [id number]     Arrival date & time 05/08/14  2207 History   First MD Initiated Contact with Patient 05/08/14 2346     Chief Complaint  Patient presents with  . Toe Pain  . Abdominal Pain     (Consider location/radiation/quality/duration/timing/severity/associated sxs/prior Treatment) Patient is a 54 y.o. female presenting with toe pain and abdominal pain. The history is provided by the patient. A language interpreter was used.  Toe Pain This is a new problem. The current episode started 1 to 4 weeks ago. The problem occurs constantly. The problem has been gradually worsening. Associated symptoms include abdominal pain. Pertinent negatives include no chills, coughing, fever, nausea, numbness, vomiting or weakness. Associated symptoms comments: Per interpreter, nursing notes and provider note, the patient sustained a laceration to the right 5th toe 2 weeks ago and has had progressive pain and swelling in the foot since that time. No fever. She is a diabetic and reports poor glycemic control. No nausea or vomiting. No drainage from the wound..  Abdominal Pain Pain location:  Periumbilical (periumbilical extending to right mid-abdomen.) Associated symptoms: no chills, no constipation, no cough, no diarrhea, no dysuria, no fever, no melena, no nausea, no shortness of breath and no vomiting   Associated symptoms comment:  Abdominal pain that seems chronic intermittent. Not associated with vomiting, eating/drinking, fever, diarrhea. Risk factors: obesity   Risk factors: has not had multiple surgeries     Past Medical History  Diagnosis Date  . Diabetes mellitus   . Hypertension    History reviewed. No pertinent past surgical history. History reviewed. No pertinent family history. History  Substance Use Topics  . Smoking status: Never Smoker   . Smokeless tobacco: Not on file  . Alcohol Use: No   OB History    No data available     Review of Systems  Constitutional: Negative for  fever and chills.  HENT: Negative.   Respiratory: Negative.  Negative for cough and shortness of breath.   Cardiovascular: Negative.   Gastrointestinal: Positive for abdominal pain. Negative for nausea, vomiting, diarrhea, constipation and melena.  Genitourinary: Negative for dysuria.  Musculoskeletal: Negative.   Skin: Positive for wound.  Neurological: Negative.  Negative for weakness and numbness.      Allergies  Ace inhibitors  Home Medications   Prior to Admission medications   Medication Sig Start Date End Date Taking? Authorizing Provider  acetaminophen (TYLENOL) 500 MG tablet Take 1 tablet (500 mg total) by mouth every 6 (six) hours as needed. Patient taking differently: Take 500 mg by mouth every 6 (six) hours as needed for mild pain.  01/15/14  Yes [name], MD  aspirin 81 MG tablet Take 1 tablet (81 mg total) by mouth daily. 11/13/13  Yes Zenia Resides, MD  diclofenac sodium (VOLTAREN) 1 % GEL Apply 4 g topically 4 (four) times daily. For shoulder pain 05/06/14  Yes Billy Fischer, MD  gabapentin (NEURONTIN) 100 MG capsule Take 1 capsule (100 mg total) by mouth 3 (three) times daily. 12/18/13  Yes [name], MD  guaiFENesin-codeine (ROBITUSSIN AC) 100-10 MG/5ML syrup Take 10 mLs by mouth 4 (four) times daily as needed for cough. 05/06/14  Yes Billy Fischer, MD  losartan-hydrochlorothiazide (HYZAAR) 50-12.5 MG per tablet Take 1 tablet by mouth daily. 12/18/13  Yes [name], MD  metFORMIN (GLUCOPHAGE) 1000 MG tablet Take 1 tablet (1,000 mg total) by mouth 2 (two) times daily with a meal. 11/13/13  Yes Gwyndolyn Saxon  A Hensel, MD  amLODipine (NORVASC) 5 MG tablet Take 1 tablet (5 mg total) by mouth daily. 01/15/14   [name], MD  atorvastatin (LIPITOR) 20 MG tablet Take 1 tablet (20 mg total) by mouth daily. 01/15/14   [name], MD  HYDROcodone-acetaminophen (NORCO/VICODIN) 5-325 MG per tablet Take 1-2 tablets by mouth every 4 (four) hours as needed.  05/09/14   Charlann Lange, PA-C  Insulin Syringe-Needle U-100 (B-D INS SYR MICROFINE .3CC/28G) 28G X 1/2" 0.3 ML MISC 1 Units by Does not apply route at bedtime. Patient not taking: Reported on 05/09/2014 03/08/11   Truett Mainland, DO  pyridOXINE (VITAMIN B-6) 25 MG tablet Take 1 tablet (25 mg total) by mouth daily. Patient not taking: Reported on 05/09/2014 03/08/11   Tanna Savoy Stinson, DO  sulfamethoxazole-trimethoprim (SEPTRA DS) 800-160 MG per tablet Take 1 tablet by mouth every 12 (twelve) hours. 05/09/14   Nizhoni Parlow, PA-C   BP 150/69 mmHg  Pulse 68  Temp(Src) 98.3 F (36.8 C) (Oral)  Resp 18  SpO2 100% Physical Exam  Constitutional: She is oriented to person, place, and time. She appears well-developed and well-nourished.  HENT:  Head: Normocephalic.  Neck: Normal range of motion. Neck supple.  Cardiovascular: Normal rate and regular rhythm.   Pulmonary/Chest: Effort normal and breath sounds normal.  Abdominal: Soft. Bowel sounds are normal. There is no tenderness. There is no rebound and no guarding.  Mild tenderness without mass, guarding or rebound at periumbilical abdomen and right mid abdomen.  Musculoskeletal: Normal range of motion.  Laceration plantar surface at base of 5th right toe. No drainage. Tender. Foot is swollen without obvious redness, however, questionable due to patient's dark skin color. Not warm to touch.   Neurological: She is alert and oriented to person, place, and time.  Skin: Skin is warm and dry. No rash noted.  Psychiatric: She has a normal mood and affect.    ED Course  Procedures (including critical care time) Labs Review Labs Reviewed  CBC - Abnormal; Notable for the following:    Hemoglobin 11.9 (*)    HCT 35.9 (*)    All other components within normal limits  COMPREHENSIVE METABOLIC PANEL - Abnormal; Notable for the following:    Glucose, Bld 113 (*)    Albumin 3.4 (*)    All other components within normal limits  LIPASE, BLOOD - Abnormal;  Notable for the following:    Lipase 78 (*)    All other components within normal limits  CBG MONITORING, ED - Abnormal; Notable for the following:    Glucose-Capillary 112 (*)    All other components within normal limits  I-STAT CG4 LACTIC ACID, ED    Imaging Review Dg Abd Acute W/chest  05/09/2014   CLINICAL DATA:  Hypertension.  Diabetes.  EXAM: ACUTE ABDOMEN SERIES (ABDOMEN 2 VIEW & CHEST 1 VIEW)  COMPARISON:  05/06/2014, 01/08/2010  FINDINGS: There is no evidence of dilated bowel loops or free intraperitoneal air. No radiopaque calculi or other significant radiographic abnormality is seen. There is moderate unchanged cardiomegaly. Hilar and mediastinal contours are within normal limits. Both lungs are clear.  IMPRESSION: Negative abdominal radiographs.  No acute cardiopulmonary disease.   Electronically Signed   By: Andreas Newport M.D.   On: 05/09/2014 02:18   Dg Foot Complete Right  05/09/2014   CLINICAL DATA:  Total pain, medial aspect of the fifth digit  EXAM: RIGHT FOOT COMPLETE - 3+ VIEW  COMPARISON:  None.  FINDINGS:  Negative for fracture, dislocation or radiopaque foreign body. There is no bone lesion or bony destruction  IMPRESSION: Negative.   Electronically Signed   By: Andreas Newport M.D.   On: 05/09/2014 02:14     EKG Interpretation None      MDM   Final diagnoses:  Wound infection, initial encounter  Periumbilical abdominal pain    She remains afebrile, with normal vital signs. Lab studies reassuring. X-ray without evidence osteomyelitis. Feel she can be discharged home with PO antibiotics and PCP follow up closely after the weekend. Pain is controlled with Norco x 1. Abdominal pain seems to be chronic without evidence to suggest acute process. Lipase slightly elevated at 78 without vomiting or LUQ tenderness. Recommended PCP follow up.    Charlann Lange, PA-C 05/10/14 2042  Francine Graven, DO 05/11/14 [phone]

## 2014-05-12 ENCOUNTER — Ambulatory Visit: Payer: No Typology Code available for payment source | Admitting: Family Medicine

## 2014-05-13 ENCOUNTER — Observation Stay (HOSPITAL_COMMUNITY): Payer: Medicaid Other

## 2014-05-13 ENCOUNTER — Inpatient Hospital Stay (HOSPITAL_COMMUNITY)
Admission: AD | Admit: 2014-05-13 | Discharge: 2014-05-26 | DRG: 240 | Disposition: A | Discharge status: Rehabilitation Facility | Payer: Medicaid Other | Source: Ambulatory Visit | Attending: Family Medicine | Admitting: Family Medicine

## 2014-05-13 ENCOUNTER — Ambulatory Visit (INDEPENDENT_AMBULATORY_CARE_PROVIDER_SITE_OTHER): Payer: No Typology Code available for payment source | Admitting: Family Medicine

## 2014-05-13 ENCOUNTER — Encounter (HOSPITAL_COMMUNITY): Payer: Self-pay | Admitting: General Practice

## 2014-05-13 ENCOUNTER — Encounter: Payer: Self-pay | Admitting: Family Medicine

## 2014-05-13 VITALS — BP 152/61 | HR 74 | Temp 98.6°F

## 2014-05-13 DIAGNOSIS — Z01810 Encounter for preprocedural cardiovascular examination: Secondary | ICD-10-CM

## 2014-05-13 DIAGNOSIS — I739 Peripheral vascular disease, unspecified: Secondary | ICD-10-CM | POA: Insufficient documentation

## 2014-05-13 DIAGNOSIS — D62 Acute posthemorrhagic anemia: Secondary | ICD-10-CM | POA: Diagnosis not present

## 2014-05-13 DIAGNOSIS — L03031 Cellulitis of right toe: Secondary | ICD-10-CM

## 2014-05-13 DIAGNOSIS — R197 Diarrhea, unspecified: Secondary | ICD-10-CM | POA: Diagnosis not present

## 2014-05-13 DIAGNOSIS — E780 Pure hypercholesterolemia: Secondary | ICD-10-CM | POA: Diagnosis present

## 2014-05-13 DIAGNOSIS — L03039 Cellulitis of unspecified toe: Secondary | ICD-10-CM

## 2014-05-13 DIAGNOSIS — I10 Essential (primary) hypertension: Secondary | ICD-10-CM | POA: Diagnosis present

## 2014-05-13 DIAGNOSIS — E1142 Type 2 diabetes mellitus with diabetic polyneuropathy: Secondary | ICD-10-CM | POA: Diagnosis present

## 2014-05-13 DIAGNOSIS — R338 Other retention of urine: Secondary | ICD-10-CM | POA: Diagnosis not present

## 2014-05-13 DIAGNOSIS — M1612 Unilateral primary osteoarthritis, left hip: Secondary | ICD-10-CM | POA: Diagnosis present

## 2014-05-13 DIAGNOSIS — E118 Type 2 diabetes mellitus with unspecified complications: Secondary | ICD-10-CM | POA: Insufficient documentation

## 2014-05-13 DIAGNOSIS — T41295A Adverse effect of other general anesthetics, initial encounter: Secondary | ICD-10-CM | POA: Diagnosis not present

## 2014-05-13 DIAGNOSIS — R9431 Abnormal electrocardiogram [ECG] [EKG]: Secondary | ICD-10-CM | POA: Insufficient documentation

## 2014-05-13 DIAGNOSIS — Z419 Encounter for procedure for purposes other than remedying health state, unspecified: Secondary | ICD-10-CM

## 2014-05-13 DIAGNOSIS — Z89511 Acquired absence of right leg below knee: Secondary | ICD-10-CM | POA: Insufficient documentation

## 2014-05-13 DIAGNOSIS — E041 Nontoxic single thyroid nodule: Secondary | ICD-10-CM | POA: Diagnosis present

## 2014-05-13 DIAGNOSIS — L02619 Cutaneous abscess of unspecified foot: Secondary | ICD-10-CM

## 2014-05-13 DIAGNOSIS — T40695A Adverse effect of other narcotics, initial encounter: Secondary | ICD-10-CM | POA: Diagnosis not present

## 2014-05-13 DIAGNOSIS — L97519 Non-pressure chronic ulcer of other part of right foot with unspecified severity: Secondary | ICD-10-CM | POA: Diagnosis present

## 2014-05-13 DIAGNOSIS — E1152 Type 2 diabetes mellitus with diabetic peripheral angiopathy with gangrene: Principal | ICD-10-CM | POA: Diagnosis present

## 2014-05-13 DIAGNOSIS — E1165 Type 2 diabetes mellitus with hyperglycemia: Secondary | ICD-10-CM

## 2014-05-13 DIAGNOSIS — L03119 Cellulitis of unspecified part of limb: Secondary | ICD-10-CM

## 2014-05-13 DIAGNOSIS — L02611 Cutaneous abscess of right foot: Secondary | ICD-10-CM | POA: Diagnosis present

## 2014-05-13 DIAGNOSIS — E876 Hypokalemia: Secondary | ICD-10-CM | POA: Diagnosis present

## 2014-05-13 DIAGNOSIS — Y92234 Operating room of hospital as the place of occurrence of the external cause: Secondary | ICD-10-CM

## 2014-05-13 DIAGNOSIS — Z794 Long term (current) use of insulin: Secondary | ICD-10-CM

## 2014-05-13 DIAGNOSIS — Z8611 Personal history of tuberculosis: Secondary | ICD-10-CM

## 2014-05-13 DIAGNOSIS — Z7982 Long term (current) use of aspirin: Secondary | ICD-10-CM

## 2014-05-13 DIAGNOSIS — L03115 Cellulitis of right lower limb: Secondary | ICD-10-CM | POA: Diagnosis present

## 2014-05-13 DIAGNOSIS — Z79899 Other long term (current) drug therapy: Secondary | ICD-10-CM

## 2014-05-13 DIAGNOSIS — E785 Hyperlipidemia, unspecified: Secondary | ICD-10-CM | POA: Insufficient documentation

## 2014-05-13 DIAGNOSIS — Z9114 Patient's other noncompliance with medication regimen: Secondary | ICD-10-CM | POA: Diagnosis present

## 2014-05-13 DIAGNOSIS — M869 Osteomyelitis, unspecified: Secondary | ICD-10-CM | POA: Insufficient documentation

## 2014-05-13 DIAGNOSIS — IMO0002 Reserved for concepts with insufficient information to code with codable children: Secondary | ICD-10-CM

## 2014-05-13 DIAGNOSIS — L0390 Cellulitis, unspecified: Secondary | ICD-10-CM | POA: Diagnosis present

## 2014-05-13 HISTORY — DX: Nonspecific reaction to tuberculin skin test without active tuberculosis: R76.11

## 2014-05-13 HISTORY — DX: Cutaneous abscess of unspecified foot: L02.619

## 2014-05-13 HISTORY — DX: Cellulitis of unspecified part of limb: L03.119

## 2014-05-13 LAB — COMPREHENSIVE METABOLIC PANEL
ALBUMIN: 3.2 g/dL — AB (ref 3.5–5.2)
ALK PHOS: 76 U/L (ref 39–117)
ALT: 11 U/L (ref 0–35)
AST: 18 U/L (ref 0–37)
Anion gap: 6 (ref 5–15)
BILIRUBIN TOTAL: 0.6 mg/dL (ref 0.3–1.2)
BUN: 6 mg/dL (ref 6–23)
CHLORIDE: 102 mmol/L (ref 96–112)
CO2: 28 mmol/L (ref 19–32)
Calcium: 8.9 mg/dL (ref 8.4–10.5)
Creatinine, Ser: 0.56 mg/dL (ref 0.50–1.10)
GFR calc Af Amer: >90 mL/min (ref >90)
GFR calc non Af Amer: >90 mL/min (ref >90)
Glucose, Bld: 189 mg/dL — ABNORMAL HIGH (ref 70–99)
POTASSIUM: 4 mmol/L (ref 3.5–5.1)
SODIUM: 136 mmol/L (ref 135–145)
Total Protein: 7.7 g/dL (ref 6.0–8.3)

## 2014-05-13 LAB — CBC
HCT: 34.9 % — ABNORMAL LOW (ref 36.0–46.0)
Hemoglobin: 11.4 g/dL — ABNORMAL LOW (ref 12.0–15.0)
MCH: 26.3 pg (ref 26.0–34.0)
MCHC: 32.7 g/dL (ref 30.0–36.0)
MCV: 80.4 fL (ref 78.0–100.0)
PLATELETS: 331 10*3/uL (ref 150–400)
RBC: 4.34 MIL/uL (ref 3.87–5.11)
RDW: 12.9 % (ref 11.5–15.5)
WBC: 8.7 10*3/uL (ref 4.0–10.5)

## 2014-05-13 LAB — GLUCOSE, CAPILLARY
GLUCOSE-CAPILLARY: 212 mg/dL — AB (ref 70–99)
GLUCOSE-CAPILLARY: 176 mg/dL — AB (ref 70–99)

## 2014-05-13 LAB — SEDIMENTATION RATE: Sed Rate: 103 mm/hr — ABNORMAL HIGH (ref 0–22)

## 2014-05-13 MED ORDER — SODIUM CHLORIDE 0.9 % IJ SOLN: 3.0000 mL | INTRAMUSCULAR | Status: DC | PRN

## 2014-05-13 MED ORDER — HEPARIN SODIUM (PORCINE) 5000 UNIT/ML IJ SOLN
5000.0000 [IU] | Freq: Three times a day (TID) | INTRAMUSCULAR | Status: DC
  Administered 2014-05-13 – 2014-05-26 (×36): 5000 [IU] via SUBCUTANEOUS
  Filled 2014-05-13 (×46): qty 1

## 2014-05-13 MED ORDER — ATORVASTATIN CALCIUM 20 MG PO TABS
20.0000 mg | ORAL_TABLET | Freq: Every day | ORAL | Status: DC
  Administered 2014-05-13 – 2014-05-14 (×2): 20 mg via ORAL
  Filled 2014-05-13 (×2): qty 1

## 2014-05-13 MED ORDER — SODIUM CHLORIDE 0.9 % IV SOLN: 250.0000 mL | INTRAVENOUS | Status: DC | PRN

## 2014-05-13 MED ORDER — ASPIRIN EC 81 MG PO TBEC
81.0000 mg | DELAYED_RELEASE_TABLET | Freq: Every day | ORAL | Status: DC
  Administered 2014-05-13: 81 mg via ORAL
  Filled 2014-05-13 (×2): qty 1

## 2014-05-13 MED ORDER — ACETAMINOPHEN 650 MG RE SUPP: 650.0000 mg | Freq: Four times a day (QID) | RECTAL | Status: DC | PRN

## 2014-05-13 MED ORDER — PIPERACILLIN-TAZOBACTAM 3.375 G IVPB 30 MIN: 3.3750 g | Freq: Once | INTRAVENOUS | Status: DC

## 2014-05-13 MED ORDER — VANCOMYCIN HCL 10 G IV SOLR
1500.0000 mg | INTRAVENOUS | Status: DC
  Administered 2014-05-14 – 2014-05-15 (×2): 1500 mg via INTRAVENOUS
  Filled 2014-05-13 (×3): qty 1500

## 2014-05-13 MED ORDER — AMLODIPINE BESYLATE 5 MG PO TABS
5.0000 mg | ORAL_TABLET | Freq: Every day | ORAL | Status: DC
  Administered 2014-05-13 – 2014-05-14 (×2): 5 mg via ORAL
  Filled 2014-05-13 (×3): qty 1

## 2014-05-13 MED ORDER — ACETAMINOPHEN 325 MG PO TABS
650.0000 mg | ORAL_TABLET | Freq: Four times a day (QID) | ORAL | Status: DC | PRN
Start: 2014-05-13 — End: 2014-05-26
  Administered 2014-05-23 – 2014-05-25 (×2): 650 mg via ORAL
  Filled 2014-05-13 (×2): qty 2

## 2014-05-13 MED ORDER — HYDROCODONE-ACETAMINOPHEN 5-325 MG PO TABS
1.0000 | ORAL_TABLET | ORAL | Status: DC | PRN
  Administered 2014-05-13 – 2014-05-17 (×8): 2 via ORAL
  Filled 2014-05-13 (×9): qty 2

## 2014-05-13 MED ORDER — VANCOMYCIN HCL IN DEXTROSE 1-5 GM/200ML-% IV SOLN: 1000.0000 mg | Freq: Once | INTRAVENOUS | Status: DC

## 2014-05-13 MED ORDER — PIPERACILLIN-TAZOBACTAM 3.375 G IVPB
3.3750 g | Freq: Three times a day (TID) | INTRAVENOUS | Status: DC
  Administered 2014-05-13 – 2014-05-15 (×5): 3.375 g via INTRAVENOUS
  Filled 2014-05-13 (×9): qty 50

## 2014-05-13 MED ORDER — INSULIN ASPART 100 UNIT/ML ~~LOC~~ SOLN
0.0000 [IU] | Freq: Three times a day (TID) | SUBCUTANEOUS | Status: DC
  Administered 2014-05-13 – 2014-05-14 (×2): 3 [IU] via SUBCUTANEOUS

## 2014-05-13 MED ORDER — ASPIRIN 81 MG PO TABS
81.0000 mg | ORAL_TABLET | Freq: Every day | ORAL | Status: DC
  Administered 2014-05-14: 81 mg via ORAL
  Filled 2014-05-13 (×3): qty 1

## 2014-05-13 MED ORDER — SODIUM CHLORIDE 0.9 % IJ SOLN
3.0000 mL | Freq: Two times a day (BID) | INTRAMUSCULAR | Status: DC
  Administered 2014-05-14 – 2014-05-16 (×6): 3 mL via INTRAVENOUS

## 2014-05-13 MED ORDER — SENNA 8.6 MG PO TABS
1.0000 | ORAL_TABLET | Freq: Two times a day (BID) | ORAL | Status: DC
  Administered 2014-05-14 – 2014-05-21 (×14): 8.6 mg via ORAL
  Filled 2014-05-13 (×18): qty 1

## 2014-05-13 NOTE — Assessment & Plan Note (Signed)
Acute; on healing laceration with eschar and worsening pain proximal to the damaged area is concerning for further soft tissue cellulitis and failure of treatment with oral Bactrim secondary to nausea and vomiting. Cannot rule out bony involvement of osteomyelitis at this time. Affected tissue has foul gangrenous scent--cannot rule out subcutaneous gas formation without further imaging.  Patient warrants admission for IV amniotic therapy, and further imaging at this time. Patient was admitted directly from the clinic into the hospital under the care of the family medicine teaching service.  Prior to her admission orders were placed for patient to have blood cultures and labs drawn, an additional x-ray of her affected foot, and IV antibiotics started. Communication with the inpatient team ensured patient would have swift transition into the inpatient service.

## 2014-05-13 NOTE — Patient Instructions (Signed)
Patient Admitted into inpatient service.

## 2014-05-13 NOTE — Progress Notes (Signed)
Patient Oriented  To the room and made comfortable

## 2014-05-13 NOTE — Progress Notes (Signed)
   HPI  Patient presents today for right toe laceration.  Right fifth toe infection/laceration Patient presented for a follow-up of a right toe laceration/infection. She was seen approximately 5 days ago in the ED for worsening pain and tenderness of her right fifth digit after dropping a chair on the toe the day prior. She states that she is been able to tend early walk on the affected foot but it is causing more and more pain. When she was seen in the ED x-rays were taken which showed no fracture or signs of osteomyelitis. She was prescribed Norco for pain and Bactrim for the infection. According to the patient, soon after beginning this therapy she had significant amount of nausea and vomiting. Although these symptoms were experienced patient continue to take her medications as scheduled. She denies any fevers chills headaches confusion malaise bodyaches pain above the ankle diaphoresis diarrhea shortness of breath or chest pain.  Smoking status noted ROS: Per HPI  Objective: BP 152/61 mmHg  Pulse 74  Temp(Src) 98.6 F (37 C) (Oral)  Wt  Gen: NAD, alert, cooperative with exam HEENT: NCAT, EOMI, PERRL CV: RRR, good S1/S2, no murmur Resp: CTABL, no wheezes, non-labored Abd: SNTND, BS present, no guarding or organomegaly Ext: Right foot with greater edema than left. Laceration on the fifth digit of right foot on the lateral aspect, eschar present within the confines of the laceration. No weeping or purulent discharge grossly present. Darker discoloration to the affected toe present. Tenderness appreciated globally throughout metatarsals on both plantar and dorsal surfaces. Range of motion full but painful throughout foot. No pain experienced proximal to the tarsals. Bilateral dorsalis pedis and posterior tibialis pulses present. Neuro: Alert and oriented, No gross deficits  Assessment and plan:  Cellulitis Acute; on healing laceration with eschar and worsening pain proximal to the damaged  area is concerning for further soft tissue cellulitis and failure of treatment with oral Bactrim secondary to nausea and vomiting. Cannot rule out bony involvement of osteomyelitis at this time. Affected tissue has foul gangrenous scent--cannot rule out subcutaneous gas formation without further imaging.  Patient warrants admission for IV amniotic therapy, and further imaging at this time. Patient was admitted directly from the clinic into the hospital under the care of the family medicine teaching service.  Prior to her admission orders were placed for patient to have blood cultures and labs drawn, an additional x-ray of her affected foot, and IV antibiotics started. Communication with the inpatient team ensured patient would have swift transition into the inpatient service.   Elberta Leatherwood, MD,MS,  PGY1 05/13/2014 7:24 PM

## 2014-05-13 NOTE — H&P (Signed)
[facility] Admission History and Physical Service Pager: [phone]  Patient name: Kimberly Lane Medical record number: [id number] Date of birth: 1960-03-22 Age: 54 y.o. Gender: female  Primary Care Provider: Marina Goodell, MD Consultants: none  Code Status: Full   Chief Complaint: cellulitis  Assessment and Plan: Kimberly Lane is a 54 y.o. female presenting with foot infection. PMH is significant for DM2, HTN, hx of primary TB and treated, and HLD.   #Foot infection: concern for gangrene vs cellulitis. Foul smelling, painful and loss of sensation of the 4th and 5th digit on the right foot. Secondary to a blunt trauma with complication of uncontrolled diabetes. No systemic symptoms and pulses were unable to palpate DP pulse on right foot.  - admit to med surg, Dr. Gwendlyn Deutscher attending  - Vanc and Zosyn  - Blood cx  - wound cx  - CRP, CBC and ESR  - ABI  - right foot x-ray pending  - pending imaging and labs, may need an MRI vs CT of foot  - consider consulting vascular and ortho based on results.  - Norco Q4 PRN  - CBC am   #DM2: uncontrolled 9.6 on 11/13/13. Called [facility] outpatient pharmacy and she hasn't filled medications there since 2013. May be getting medicine through MAP program but unsure if this is still supplying her with medications.  - Hgb A1c  - holding home metformin - Sensitive SSI   #HTN/HLD: not sure if taking medications or can afford as previously stated.  - giving home amlodipine  - holding losartan and HCTZ  - home Lipitor 20 mg   #High risk social situation: unsure of her understanding of her medical issues and compliance with her medications as she is a refugee and speaks Arabic. Her husband is currently in [place] and has no other family in the [place].  - SW   FEN/GI: Heart health/Carb modified diet/ NS 75 mL/hr  Prophylaxis: heparin SubQ   Disposition: admitted for foot infection and pending improvement   History of  Present Illness: Kimberly Lane is a 54 y.o. female presenting with foot infection.  Interview conducted through [employer]. Patient speaks arabic and is a refugee.   She was walking and stubbed her fifth toe on her right foot with a chair. This occurred about 7-10 days ago.  The interpretor saw her on Friday, alerted the nurse and she was seen in the ED. Her imaging was negative and afebrile so was discharged home on antibiotics and pain medication. She had some vomiting on Saturday and this resolved with eating some rice and drinking water. She was able to be compliant with her antibiotics. The pain continued and was seen in [facility] today.   The pain is 9/10, sharp, constant, non-radiating and worsening. She denies any chest pain, shortness of breath, joint pain or muscle pain. She does have some abdominal pain that is periumbilical and radiating to the right side. She reports normal bowel movements and voiding. Denied any recent diarrhea. Lipase was recently elevated to 78 in the ED. She reports no travel and no history of blood clots.   Review Of Systems: Per HPI with the following additions: See HPI  Otherwise 12 point review of systems was performed and was unremarkable.  Patient Active Problem List   Diagnosis Date Noted  . Cellulitis 05/13/2014  . Pure hypercholesterolemia 11/14/2013  . High risk social situation 11/13/2013  . Back pain without radiation 09/05/2011    Class: Acute  . Osteoarthritis of  left hip 05/31/2011  . History of primary TB 04/26/2011  . DM (diabetes mellitus), type 2, uncontrolled 05/17/2010  . Essential hypertension 12/09/2009   Past Medical History: Past Medical History  Diagnosis Date  . Diabetes mellitus   . Hypertension    Past Surgical History: No past surgical history on file. Social History: History  Substance Use Topics  . Smoking status: Never Smoker   . Smokeless tobacco: Not on file  . Alcohol Use: No   Additional social history: none    Please also refer to relevant sections of EMR.  Family History: No family history on file. Allergies and Medications: Allergies  Allergen Reactions  . Ace Inhibitors     cough   No current facility-administered medications on file prior to encounter.   Current Outpatient Prescriptions on File Prior to Encounter  Medication Sig Dispense Refill  . acetaminophen (TYLENOL) 500 MG tablet Take 1 tablet (500 mg total) by mouth every 6 (six) hours as needed. (Patient taking differently: Take 500 mg by mouth every 6 (six) hours as needed for mild pain. ) 30 tablet 0  . amLODipine (NORVASC) 5 MG tablet Take 1 tablet (5 mg total) by mouth daily. 90 tablet 3  . aspirin 81 MG tablet Take 1 tablet (81 mg total) by mouth daily. 100 tablet 0  . atorvastatin (LIPITOR) 20 MG tablet Take 1 tablet (20 mg total) by mouth daily. 90 tablet 3  . diclofenac sodium (VOLTAREN) 1 % GEL Apply 4 g topically 4 (four) times daily. For shoulder pain 3 Tube 0  . gabapentin (NEURONTIN) 100 MG capsule Take 1 capsule (100 mg total) by mouth 3 (three) times daily. 90 capsule 3  . guaiFENesin-codeine (ROBITUSSIN AC) 100-10 MG/5ML syrup Take 10 mLs by mouth 4 (four) times daily as needed for cough. 180 mL 0  . HYDROcodone-acetaminophen (NORCO/VICODIN) 5-325 MG per tablet Take 1-2 tablets by mouth every 4 (four) hours as needed. 12 tablet 0  . Insulin Syringe-Needle U-100 (B-D INS SYR MICROFINE .3CC/28G) 28G X 1/2" 0.3 ML MISC 1 Units by Does not apply route at bedtime. (Patient not taking: Reported on 05/09/2014) 100 each 0  . losartan-hydrochlorothiazide (HYZAAR) 50-12.5 MG per tablet Take 1 tablet by mouth daily. 30 tablet 3  . metFORMIN (GLUCOPHAGE) 1000 MG tablet Take 1 tablet (1,000 mg total) by mouth 2 (two) times daily with a meal. 60 tablet 11  . pyridOXINE (VITAMIN B-6) 25 MG tablet Take 1 tablet (25 mg total) by mouth daily. (Patient not taking: Reported on 05/09/2014) 30 tablet 11  . sulfamethoxazole-trimethoprim  (SEPTRA DS) 800-160 MG per tablet Take 1 tablet by mouth every 12 (twelve) hours. 20 tablet 0    Objective: BP 163/66 mmHg  Pulse 63  Temp(Src) 98.8 F (37.1 C) (Oral)  Resp 16  Ht 5' (1.524 m)  Wt 129 lb 13.6 oz (58.9 kg)  BMI 25.36 kg/m2  SpO2 100% Exam: General: NAD, older appearing than stated age.  HEENT: EOMI, [place]/AT,  Cardiovascular: S1S2, RRR, no murmurs or gallops  Respiratory: CTAB, no wheezing or crackles. Normal effort  Abdomen: no tenderness with palpation, ND, soft, +BS, no guarding or rigidity  Extremities: no DP pulse palpated on right foot but did on left, no erythema or warmth but cold to touch. 4th and 5th digit painful to touch and unable to move. 1st-3rd has sensation and able to move. Right foot has edema compared to left but does not extend proximally. Right foot is darker in color as  compared to left.  Skin: small nickel size tear on posterior left flank  Neuro: alert and oriented, no gross deficits.   Labs and Imaging: CBC BMET   Recent Labs Lab 05/08/14 2245  WBC 10.1  HGB 11.9*  HCT 35.9*  PLT 371    Recent Labs Lab 05/08/14 2245  NA 141  K 4.1  CL 104  CO2 27  BUN 11  CREATININE 0.68  GLUCOSE 113*  CALCIUM 9.5       Rosemarie Ax, MD 05/13/2014, 4:24 PM PGY-2, [place] Intern pager: [phone], text pages welcome

## 2014-05-13 NOTE — Progress Notes (Addendum)
ANTIBIOTIC CONSULT NOTE - INITIAL  Pharmacy Consult for Vancomycin and Zosyn Indication: cellulitis  Allergies  Allergen Reactions  . Ace Inhibitors     cough    Patient Measurements:   Ht: 62 in   Wt: ~58 kg  Vital Signs: Temp: 98.8 F (37.1 C) (03/22 1610) Temp Source: Oral (03/22 1610) BP: 163/66 mmHg (03/22 1610) Pulse Rate: 63 (03/22 1610) Intake/Output from previous day:   Intake/Output from this shift:    Labs: No results for input(s): WBC, HGB, PLT, LABCREA, CREATININE in the last 72 hours. CrCl cannot be calculated (Unknown ideal weight.). No results for input(s): VANCOTROUGH, VANCOPEAK, VANCORANDOM, GENTTROUGH, GENTPEAK, GENTRANDOM, TOBRATROUGH, TOBRAPEAK, TOBRARND, AMIKACINPEAK, AMIKACINTROU, AMIKACIN in the last 72 hours.   Microbiology: No results found for this or any previous visit (from the past 720 hour(s)).  Medical History: Past Medical History  Diagnosis Date  . Diabetes mellitus   . Hypertension     Medications:  See electronic med rec  Assessment: 54 y.o. female presents with R foot injury about 3 weeks ago. Came to ED 3/19 where Xray showed no evidence of osteomyelitis so discharge home on po Bactrim. To begin broad spectrum antibiotics (Vanc and Zosyn) for R foot injury. Afeb. 3/17 SCr 0.68, est CrCl 65 ml/min. 3/17 WBC 10.1. Labs from today pending.  Goal of Therapy:  Vancomycin trough level 10-15 mcg/ml  Plan:  Zosyn 3.375gm IV q8h - each dose over 4 hours Vancomycin 1500mg  IV q24h Measure weight and f/u baseline labs  Sherlon Handing, PharmD, BCPS Clinical pharmacist, pager [phone] 05/13/2014,4:21 PM   Addendum: Baseline Scr 0.56, est CrCl 65 ml/min. WBC wnl.  Sherlon Handing, PharmD, BCPS Clinical pharmacist, pager [phone] 05/13/2014 8:56 PM

## 2014-05-14 ENCOUNTER — Observation Stay (HOSPITAL_COMMUNITY): Payer: Medicaid Other

## 2014-05-14 DIAGNOSIS — I10 Essential (primary) hypertension: Secondary | ICD-10-CM | POA: Diagnosis present

## 2014-05-14 DIAGNOSIS — R338 Other retention of urine: Secondary | ICD-10-CM | POA: Diagnosis not present

## 2014-05-14 DIAGNOSIS — D62 Acute posthemorrhagic anemia: Secondary | ICD-10-CM | POA: Diagnosis not present

## 2014-05-14 DIAGNOSIS — Y92234 Operating room of hospital as the place of occurrence of the external cause: Secondary | ICD-10-CM | POA: Diagnosis not present

## 2014-05-14 DIAGNOSIS — R05 Cough: Secondary | ICD-10-CM | POA: Diagnosis not present

## 2014-05-14 DIAGNOSIS — L03115 Cellulitis of right lower limb: Secondary | ICD-10-CM | POA: Diagnosis present

## 2014-05-14 DIAGNOSIS — E785 Hyperlipidemia, unspecified: Secondary | ICD-10-CM | POA: Diagnosis present

## 2014-05-14 DIAGNOSIS — L97519 Non-pressure chronic ulcer of other part of right foot with unspecified severity: Secondary | ICD-10-CM | POA: Diagnosis present

## 2014-05-14 DIAGNOSIS — Z01810 Encounter for preprocedural cardiovascular examination: Secondary | ICD-10-CM | POA: Diagnosis not present

## 2014-05-14 DIAGNOSIS — Z8611 Personal history of tuberculosis: Secondary | ICD-10-CM | POA: Diagnosis not present

## 2014-05-14 DIAGNOSIS — M1612 Unilateral primary osteoarthritis, left hip: Secondary | ICD-10-CM | POA: Diagnosis present

## 2014-05-14 DIAGNOSIS — T40695A Adverse effect of other narcotics, initial encounter: Secondary | ICD-10-CM | POA: Diagnosis not present

## 2014-05-14 DIAGNOSIS — L02611 Cutaneous abscess of right foot: Secondary | ICD-10-CM | POA: Diagnosis present

## 2014-05-14 DIAGNOSIS — Z79899 Other long term (current) drug therapy: Secondary | ICD-10-CM | POA: Diagnosis not present

## 2014-05-14 DIAGNOSIS — Z89519 Acquired absence of unspecified leg below knee: Secondary | ICD-10-CM | POA: Diagnosis not present

## 2014-05-14 DIAGNOSIS — E780 Pure hypercholesterolemia: Secondary | ICD-10-CM | POA: Diagnosis present

## 2014-05-14 DIAGNOSIS — M869 Osteomyelitis, unspecified: Secondary | ICD-10-CM | POA: Diagnosis not present

## 2014-05-14 DIAGNOSIS — R197 Diarrhea, unspecified: Secondary | ICD-10-CM | POA: Diagnosis not present

## 2014-05-14 DIAGNOSIS — E1165 Type 2 diabetes mellitus with hyperglycemia: Secondary | ICD-10-CM | POA: Diagnosis present

## 2014-05-14 DIAGNOSIS — R9431 Abnormal electrocardiogram [ECG] [EKG]: Secondary | ICD-10-CM

## 2014-05-14 DIAGNOSIS — L03031 Cellulitis of right toe: Secondary | ICD-10-CM

## 2014-05-14 DIAGNOSIS — Z89511 Acquired absence of right leg below knee: Secondary | ICD-10-CM | POA: Diagnosis not present

## 2014-05-14 DIAGNOSIS — Z794 Long term (current) use of insulin: Secondary | ICD-10-CM | POA: Diagnosis not present

## 2014-05-14 DIAGNOSIS — E041 Nontoxic single thyroid nodule: Secondary | ICD-10-CM | POA: Diagnosis present

## 2014-05-14 DIAGNOSIS — M79674 Pain in right toe(s): Secondary | ICD-10-CM | POA: Diagnosis present

## 2014-05-14 DIAGNOSIS — E1142 Type 2 diabetes mellitus with diabetic polyneuropathy: Secondary | ICD-10-CM | POA: Diagnosis present

## 2014-05-14 DIAGNOSIS — I739 Peripheral vascular disease, unspecified: Secondary | ICD-10-CM | POA: Diagnosis not present

## 2014-05-14 DIAGNOSIS — Z7982 Long term (current) use of aspirin: Secondary | ICD-10-CM | POA: Diagnosis not present

## 2014-05-14 DIAGNOSIS — E1152 Type 2 diabetes mellitus with diabetic peripheral angiopathy with gangrene: Secondary | ICD-10-CM | POA: Diagnosis present

## 2014-05-14 DIAGNOSIS — Z9114 Patient's other noncompliance with medication regimen: Secondary | ICD-10-CM | POA: Diagnosis present

## 2014-05-14 DIAGNOSIS — E876 Hypokalemia: Secondary | ICD-10-CM | POA: Diagnosis present

## 2014-05-14 DIAGNOSIS — T41295A Adverse effect of other general anesthetics, initial encounter: Secondary | ICD-10-CM | POA: Diagnosis not present

## 2014-05-14 LAB — CBC
HEMATOCRIT: 31.7 % — AB (ref 36.0–46.0)
HEMOGLOBIN: 10.3 g/dL — AB (ref 12.0–15.0)
MCH: 26.1 pg (ref 26.0–34.0)
MCHC: 32.5 g/dL (ref 30.0–36.0)
MCV: 80.5 fL (ref 78.0–100.0)
Platelets: 329 10*3/uL (ref 150–400)
RBC: 3.94 MIL/uL (ref 3.87–5.11)
RDW: 12.8 % (ref 11.5–15.5)
WBC: 7 10*3/uL (ref 4.0–10.5)

## 2014-05-14 LAB — BASIC METABOLIC PANEL
Anion gap: 6 (ref 5–15)
BUN: 7 mg/dL (ref 6–23)
CO2: 28 mmol/L (ref 19–32)
Calcium: 8.6 mg/dL (ref 8.4–10.5)
Chloride: 102 mmol/L (ref 96–112)
Creatinine, Ser: 0.64 mg/dL (ref 0.50–1.10)
GLUCOSE: 201 mg/dL — AB (ref 70–99)
POTASSIUM: 3.4 mmol/L — AB (ref 3.5–5.1)
SODIUM: 136 mmol/L (ref 135–145)

## 2014-05-14 LAB — GLUCOSE, CAPILLARY
GLUCOSE-CAPILLARY: 91 mg/dL (ref 70–99)
Glucose-Capillary: 216 mg/dL — ABNORMAL HIGH (ref 70–99)
Glucose-Capillary: 143 mg/dL — ABNORMAL HIGH (ref 70–99)
Glucose-Capillary: 351 mg/dL — ABNORMAL HIGH (ref 70–99)

## 2014-05-14 LAB — C-REACTIVE PROTEIN: CRP: 4.7 mg/dL — AB (ref <0.60)

## 2014-05-14 MED ORDER — GADOBENATE DIMEGLUMINE 529 MG/ML IV SOLN
10.0000 mL | Freq: Once | INTRAVENOUS | Status: AC | PRN
  Administered 2014-05-14: 10 mL via INTRAVENOUS

## 2014-05-14 MED ORDER — PROMETHAZINE HCL 25 MG/ML IJ SOLN
25.0000 mg | Freq: Four times a day (QID) | INTRAMUSCULAR | Status: DC | PRN
  Administered 2014-05-17: 25 mg via INTRAVENOUS
  Filled 2014-05-14 (×2): qty 1

## 2014-05-14 MED ORDER — ATORVASTATIN CALCIUM 40 MG PO TABS
40.0000 mg | ORAL_TABLET | Freq: Every day | ORAL | Status: DC
  Administered 2014-05-15 – 2014-05-25 (×10): 40 mg via ORAL
  Filled 2014-05-14 (×12): qty 1

## 2014-05-14 MED ORDER — ONDANSETRON HCL 4 MG/2ML IJ SOLN: 4.0000 mg | Freq: Four times a day (QID) | INTRAMUSCULAR | Status: DC | PRN

## 2014-05-14 MED ORDER — INSULIN ASPART 100 UNIT/ML ~~LOC~~ SOLN
0.0000 [IU] | Freq: Three times a day (TID) | SUBCUTANEOUS | Status: DC
  Administered 2014-05-14 – 2014-05-15 (×2): 15 [IU] via SUBCUTANEOUS
  Administered 2014-05-15 (×2): 3 [IU] via SUBCUTANEOUS
  Administered 2014-05-16: 11 [IU] via SUBCUTANEOUS
  Administered 2014-05-16: 8 [IU] via SUBCUTANEOUS
  Administered 2014-05-16 – 2014-05-17 (×4): 5 [IU] via SUBCUTANEOUS
  Administered 2014-05-18: 15 [IU] via SUBCUTANEOUS
  Administered 2014-05-18: 5 [IU] via SUBCUTANEOUS
  Administered 2014-05-18: 11 [IU] via SUBCUTANEOUS
  Administered 2014-05-19: 8 [IU] via SUBCUTANEOUS
  Administered 2014-05-20: 3 [IU] via SUBCUTANEOUS
  Administered 2014-05-21 (×2): 5 [IU] via SUBCUTANEOUS
  Administered 2014-05-22: 8 [IU] via SUBCUTANEOUS
  Administered 2014-05-22: 2 [IU] via SUBCUTANEOUS
  Administered 2014-05-23: 5 [IU] via SUBCUTANEOUS
  Administered 2014-05-23: 11 [IU] via SUBCUTANEOUS
  Administered 2014-05-24: 5 [IU] via SUBCUTANEOUS
  Administered 2014-05-24: 8 [IU] via SUBCUTANEOUS
  Administered 2014-05-24: 15 [IU] via SUBCUTANEOUS
  Administered 2014-05-25: 5 [IU] via SUBCUTANEOUS
  Administered 2014-05-25 – 2014-05-26 (×3): 3 [IU] via SUBCUTANEOUS
  Administered 2014-05-26: 8 [IU] via SUBCUTANEOUS

## 2014-05-14 MED ORDER — POTASSIUM CHLORIDE CRYS ER 10 MEQ PO TBCR
30.0000 meq | EXTENDED_RELEASE_TABLET | Freq: Once | ORAL | Status: AC
  Administered 2014-05-14: 30 meq via ORAL
  Filled 2014-05-14: qty 1

## 2014-05-14 NOTE — Progress Notes (Signed)
VASCULAR LAB PRELIMINARY  ARTERIAL  ABI completed:    RIGHT    LEFT    PRESSURE WAVEFORM  PRESSURE WAVEFORM  BRACHIAL 157 Triphasic BRACHIAL 139 Triphasic  DP 65 Severely Dampened Monophasic DP 72 Severely Dampened Monophasic  PT 47 Severely Dampened Monophasic PT 70 Severely Dampened Monophasic  GREAT TOE Unable to obtain  GREAT TOE Unable to obtain.     RIGHT LEFT  ABI 0.41 0.46   ABIs and Doppler waveforms indicate a severe reduction in arterial flow bilaterally. Unable to obtain Great toe pressure due to movement and pain.  Verdie Barrows, RVS 05/14/2014, 3:22 PM

## 2014-05-14 NOTE — Progress Notes (Signed)
Pt to radiology, no complaints or concerns stated

## 2014-05-14 NOTE — Progress Notes (Signed)
UR completed 

## 2014-05-14 NOTE — Progress Notes (Signed)
Family Medicine Teaching Service Daily Progress Note Intern Pager: [phone]  Patient name: Kimberly Lane Medical record number: [id number] Date of birth: 05/11/1960 Age: 54 y.o. Gender: female  Primary Care Provider: Marina Goodell, MD Consultants: None Code Status: Full  Pt Overview and Major Events to Date:  3/22-3/23: Admitted with infection of RLE on IV antibiotics. MRI today.   Assessment and Plan: Kimberly Lane is a 54 y.o. female presenting with foot infection. PMH is significant for DM2, HTN, hx of primary TB and treated, HLD.   #Foot infection: concern for Gangrene vs Cellulitis vs. Necrotizing fasciitis given presentation and risk factors LRINEC - 3, less likely. Foul smelling, painful up to the ankle with some edema and loss of sensation of the 4th and 5th digit on the right foot. Secondary to a blunt trauma with complication of uncontrolled diabetes. No systemic symptoms and pulses were unable to palpate DP pulse on right foot. WBC normal. Afebrile. Does have some nausea. Pain 9/10 > 5/10.   - Continue broad spectrum antibiotics.  - Blood cx , Wound cx - CRP 4.7, ESR 103  - ABI and MRI today.  - R Foot X-Ray without evidence of osteomyelitis.  - consider consulting vascular and ortho based on results.  - Norco Q4 PRN  - Phenergan IV for nausea.   #DM2: uncontrolled 9.6 on 11/13/13. Called [facility] outpatient pharmacy and she hasn't filled medications there since 2013. May be getting medicine through MAP program but unsure if this is still supplying her with medications. CBG's upper 100's to 200's likely related to infection.  - Hgb A1c pending - holding home metformin - Sensitive SSI transitioned to Moderate SSI.   #HTN/HLD: not sure if taking medications or can afford as previously stated. Normotensive for the most part. May be on multiple meds due to noncompliance. Will be careful not to precipitate hypotension. - giving home amlodipine  - holding losartan and HCTZ  - home  Lipitor 20 mg   #High risk social situation: unsure of her understanding of her medical issues and compliance with her medications as she is a refugee and speaks Arabic. Her husband is currently in [place] and has no other family in the [place] for meds.   FEN/GI: Heart health/Carb modified diet/ NS 75 mL/hr  Prophylaxis: heparin SubQ   Disposition: Pending imaging results and improvement.   Subjective:  No acute events overnight. Pt. States that pain is improved to 5/10 this am. She only required prn pain meds x 2 overnight. She continues to have nausea, and one episode of vomiting this am when attempting to take PO meds. Otherwise, says she feels better. Reports not attempting any traditional medicine or home remedies after injury.   Objective: Temp:  [98.1 F (36.7 C)-98.8 F (37.1 C)] 98.1 F (36.7 C) (03/23 0528) Pulse Rate:  [63-74] 63 (03/23 0528) Resp:  [16-19] 16 (03/23 0528) BP: (129-163)/(53-66) 129/53 mmHg (03/23 0528) SpO2:  [95 %-100 %] 95 % (03/23 0528) Weight:  [129 lb 13.6 oz (58.9 kg)] 129 lb 13.6 oz (58.9 kg) (03/22 1610) Physical Exam: General: NAD, AAOx3 Cardiovascular: RRR, No MGR, Normal S1/S2. 2+ distal pulses in BL upper extremities. Poor peripheral perfusion in lower extremities.  Respiratory: CTA Bilaterally, appropriate rate, unlabored.  Abdomen: S, NT, ND, +BS Extremities: RLE with 1cm gash on lateral aspect of right 5th digit. Significant TTP up to the ankle. No erythema, but difficult to evaluate given skin tone. Swelling over dorsum of foot  to the ankle. Sensation to pain only over lateral two digits. 1+ PT pulse, 0+ DP pulse on the right. LLE: NT, decreased sensation, 1+ dP pulse, and 1+ PT pulse.   Laboratory:  Recent Labs Lab 05/08/14 2245 05/13/14 1915 05/14/14 0645  WBC 10.1 8.7 7.0  HGB 11.9* 11.4* 10.3*  HCT 35.9* 34.9* 31.7*  PLT 371 331 329    Recent Labs Lab 05/08/14 2245 05/13/14 1915 05/14/14 0645  NA  141 136 136  K 4.1 4.0 3.4*  CL 104 102 102  CO2 _0 BUN _1 CREATININE 0.68 0.56 0.64  CALCIUM 9.5 8.9 8.6  PROT 7.8 7.7  --   BILITOT 0.3 0.6  --   ALKPHOS 74 76  --   ALT 11 11  --   AST 13 18  --   GLUCOSE 113* 189* 201*   Blood Cx - pending  Wound Cx - pending collection.   ESR - 103  CRP - 4.7   Aquilla Hacker, MD 05/14/2014, 8:43 AM PGY-1, [place] Intern pager: [phone], text pages welcome

## 2014-05-15 DIAGNOSIS — I96 Gangrene, not elsewhere classified: Secondary | ICD-10-CM

## 2014-05-15 LAB — CBC
HCT: 33.4 % — ABNORMAL LOW (ref 36.0–46.0)
HEMOGLOBIN: 10.9 g/dL — AB (ref 12.0–15.0)
MCH: 26.2 pg (ref 26.0–34.0)
MCHC: 32.6 g/dL (ref 30.0–36.0)
MCV: 80.3 fL (ref 78.0–100.0)
Platelets: 342 10*3/uL (ref 150–400)
RBC: 4.16 MIL/uL (ref 3.87–5.11)
RDW: 12.9 % (ref 11.5–15.5)
WBC: 9.9 10*3/uL (ref 4.0–10.5)

## 2014-05-15 LAB — BASIC METABOLIC PANEL
Anion gap: 9 (ref 5–15)
BUN: 6 mg/dL (ref 6–23)
CHLORIDE: 102 mmol/L (ref 96–112)
CO2: 25 mmol/L (ref 19–32)
CREATININE: 0.59 mg/dL (ref 0.50–1.10)
Calcium: 8.8 mg/dL (ref 8.4–10.5)
GFR calc Af Amer: >90 mL/min (ref >90)
GFR calc non Af Amer: >90 mL/min (ref >90)
Glucose, Bld: 221 mg/dL — ABNORMAL HIGH (ref 70–99)
POTASSIUM: 3.4 mmol/L — AB (ref 3.5–5.1)
Sodium: 136 mmol/L (ref 135–145)

## 2014-05-15 LAB — GLUCOSE, CAPILLARY
GLUCOSE-CAPILLARY: 186 mg/dL — AB (ref 70–99)
GLUCOSE-CAPILLARY: 260 mg/dL — AB (ref 70–99)
GLUCOSE-CAPILLARY: 387 mg/dL — AB (ref 70–99)
Glucose-Capillary: 283 mg/dL — ABNORMAL HIGH (ref 70–99)

## 2014-05-15 LAB — HEMOGLOBIN A1C
Hgb A1c MFr Bld: 9.6 % — ABNORMAL HIGH (ref 4.8–5.6)
Mean Plasma Glucose: 229 mg/dL

## 2014-05-15 MED ORDER — AMLODIPINE BESYLATE 10 MG PO TABS
10.0000 mg | ORAL_TABLET | Freq: Every day | ORAL | Status: DC
  Administered 2014-05-15 – 2014-05-25 (×9): 10 mg via ORAL
  Filled 2014-05-15 (×12): qty 1

## 2014-05-15 MED ORDER — LISINOPRIL 20 MG PO TABS
20.0000 mg | ORAL_TABLET | Freq: Every day | ORAL | Status: DC
  Administered 2014-05-15 – 2014-05-25 (×9): 20 mg via ORAL
  Filled 2014-05-15 (×12): qty 1

## 2014-05-15 MED ORDER — LISINOPRIL 40 MG PO TABS: 40.0000 mg | ORAL_TABLET | Freq: Every day | ORAL | Status: DC

## 2014-05-15 MED ORDER — ASPIRIN EC 81 MG PO TBEC
81.0000 mg | DELAYED_RELEASE_TABLET | Freq: Every day | ORAL | Status: DC
  Administered 2014-05-15 – 2014-05-25 (×9): 81 mg via ORAL
  Filled 2014-05-15 (×9): qty 1

## 2014-05-15 MED ORDER — CLINDAMYCIN HCL 300 MG PO CAPS
450.0000 mg | ORAL_CAPSULE | Freq: Three times a day (TID) | ORAL | Status: DC
  Administered 2014-05-15 – 2014-05-22 (×20): 450 mg via ORAL
  Filled 2014-05-15 (×27): qty 1

## 2014-05-15 MED ORDER — LEVOFLOXACIN 750 MG PO TABS
750.0000 mg | ORAL_TABLET | Freq: Every day | ORAL | Status: DC
  Administered 2014-05-15 – 2014-05-22 (×8): 750 mg via ORAL
  Filled 2014-05-15 (×9): qty 1

## 2014-05-15 NOTE — Progress Notes (Signed)
Inpatient Diabetes Program Recommendations  AACE/ADA: New Consensus Statement on Inpatient Glycemic Control (2013)  Target Ranges:  Prepandial:   less than 140 mg/dL      Peak postprandial:   less than 180 mg/dL (1-2 hours)      Critically ill patients:  140 - 180 mg/dL    Results for MEASIA, GALICIA (MRN [id number]) as of 05/15/2014 09:34  Ref. Range 05/14/2014 17:22 05/14/2014 20:58 05/15/2014 07:01 05/15/2014 07:54  Glucose-Capillary Latest Range: 70-99 mg/dL 351 (H) 91  186 (H)   Reason for assessment: elevated CBG  Diabetes history: yes Outpatient Diabetes medications: Amaryl 2mg  qam, Metformin 1000mg  bid Current orders for Inpatient glycemic control: Novolog 0-15 units tid  Please consider adding low dose Lantus.  I am not concerned he will go too low, the 91mg /dl blood sugar he had yesterday was a result of the 15 units of Novolog given for the prior blood sugar of 351mg /dl.  Consider 5 units Lantus qday starting tonight.   Gentry Fitz, RN, BA, MHA, CDE Diabetes Coordinator Inpatient Diabetes Program  [phone] (Team Pager) [phone] [facility]) 05/15/2014 9:38 AM

## 2014-05-15 NOTE — Progress Notes (Signed)
Family Medicine Teaching Service Daily Progress Note Intern Pager: [phone]  Patient name: Kimberly Lane Medical record number: [id number] Date of birth: 11/01/1960 Age: 54 y.o. Gender: female  Primary Care Provider: Marina Goodell, MD Consultants: None Code Status: Full  Pt Overview and Major Events to Date:  3/22-3/23: Admitted with infection of RLE on IV antibiotics. MRI today.   Assessment and Plan: Kimberly Lane is a 54 y.o. female presenting with foot infection. PMH is significant for DM2, HTN, hx of primary TB and treated, HLD.   #Foot infection: concern for Gangrene vs Cellulitis. Initial concern for necrotizing fasciitis given presentation and risk factors LRINEC - 3 unlikely. Foul smelling, painful up to the ankle with some edema and loss of sensation of the 4th and 5th digit on the right foot. Secondary to a blunt trauma with complication of uncontrolled diabetes. No systemic symptoms. Poor perfusion by ABI.WBC normal. Afebrile. Does have some nausea. Pain improving 9/10 > 5/10.  Now day 7 of infection. - Continue broad spectrum antibiotics. Consider switching to PO today.  - ABI's with significant reduction in B/L arterial blood flow.  - Consult vascular surgery today for evaluation. Discussion whether to intervene or hold off.   - Blood cx NGTD.  - Wound Cx pending. Holding off for now given low utility at this time.  - CRP 4.7, ESR 103  - MRI without evidence of osteomyelitis. Edema.  - Norco Q4 PRN  - Phenergan IV for nausea.   #DM2: uncontrolled 9.6 here. Called [facility] outpatient pharmacy and she hasn't filled medications there since 2013. May be getting medicine through MAP program but unsure if this is still supplying her with medications. CBG's sporadic likely related to infection.  - Hgb A1c 9.6.  - holding home metformin - Moderate SSI.   #HTN/HLD: not sure if taking medications or can afford as previously stated. Normotensive for the most part. May be on multiple meds  due to noncompliance. Will be careful not to precipitate hypotension. - giving home amlodipine  - Restart ACE today.  - Lipitor 78m.   #High risk social situation: unsure of her understanding of her medical issues and compliance with her medications as she is a refugee and speaks Arabic. [place]   FEN/GI: Heart health/Carb modified diet/ NS 75 mL/hr  Prophylaxis: heparin SubQ   Disposition: Discuss with vascular today. Intervention vs. Further outpatient evaluation.   Subjective:  No acute events overnight. Not requiring much pain medicine for control. Mostly only painful to palpation. Unable to talk well with patient as I paged two Arabic interpreters, but was unable to reach anyone at this time even with language line. Pt. Able to communicate about pain, but otherwise conversation limited. Will follow up with her later today.    Objective: Temp:  [99.5 F (37.5 C)-100 F (37.8 C)] 100 F (37.8 C) (03/24 0542) Resp:  [20] 20 (03/24 0542) BP: (134-172)/(59-67) 172/67 mmHg (03/24 0542) SpO2:  [95 %-98 %] 95 % (03/24 0542) Physical Exam: General: NAD, AAOx3 Cardiovascular: RRR, No MGR, Normal S1/S2. 2+ distal pulses in BL upper extremities. Poor peripheral perfusion in lower extremities.  Respiratory: CTA Bilaterally, appropriate rate, unlabored.  Abdomen: S, NT, ND, +BS Extremities: RLE with 1cm gash on lateral aspect of right 5th digit. Significant TTP up to the ankle. No erythema, but difficult to evaluate given skin tone. Swelling over dorsum of foot  to the ankle much improved from yesterday. No drainage. RLE: 1 PT pulse, unable to palpate DP.  LLE: NT, decreased sensation, 0-1+ dP pulse, and 0-1+ PT pulse.   Laboratory:  Recent Labs Lab 05/13/14 1915 05/14/14 0645 05/15/14 0701  WBC 8.7 7.0 9.9  HGB 11.4* 10.3* 10.9*  HCT 34.9* 31.7* 33.4*  PLT 331 329 342    Recent  Labs Lab 05/08/14 2245 05/13/14 1915 05/14/14 0645 05/15/14 0701  NA 141 136 136 136  K 4.1 4.0 3.4* 3.4*  CL 104 102 102 102  CO2 _0 BUN _1 CREATININE 0.68 0.56 0.64 0.59  CALCIUM 9.5 8.9 8.6 8.8  PROT 7.8 7.7  --   --   BILITOT 0.3 0.6  --   --   ALKPHOS 74 76  --   --   ALT 11 11  --   --   AST 13 18  --   --   GLUCOSE 113* 189* 201* 221*   Blood Cx - NGTD  Wound Cx - pending collection.   ESR - 103  CRP - 4.7   Aquilla Hacker, MD 05/15/2014, 9:01 AM PGY-1, [place] Intern pager: [phone], text pages welcome

## 2014-05-15 NOTE — Care Management Note (Signed)
    Page 1 of 1   05/15/2014     2:54:18 PM CARE MANAGEMENT NOTE 05/15/2014  Patient:  Kimberly Lane,Kimberly Lane   Account Number:  [id number]  Date Initiated:  05/15/2014  Documentation initiated by:  Tomi Bamberger  Subjective/Objective Assessment:   dx cellulits of foot  admit- lives with family, speaks arabic, patient has [facility] card and goes to [facility].     Action/Plan:   Anticipated DC Date:  05/16/2014   Anticipated DC Plan:  [place]  CM consult  Follow-up appt scheduled      Choice offered to / List presented to:             Status of service:  Completed, signed off Medicare Important Message given?  NO (If response is "NO", the following Medicare IM given date fields will be blank) Date Medicare IM given:   Medicare IM given by:   Date Additional Medicare IM given:   Additional Medicare IM given by:    Discharge Disposition:  HOME/SELF CARE  Per UR Regulation:  Reviewed for med. necessity/level of care/duration of stay  If discussed at [place] of Stay Meetings, dates discussed:    Comments:  05/15/14 [place], BSN [phone] patient lives with family members , she has an [facility] card and [facility], apt scheduled for 3/28 at 11 am.

## 2014-05-15 NOTE — Consult Note (Signed)
[facility] NOTE   MRN : [id number]  Reason for Consult: Right 5th toe injury malodorous unable to palpate pulses. Referring Physician: Rosemarie Ax, MD  History of Present Illness: 54 y/o female with 15 day history of toe injury with increasing pain.  Her past medical history includes poor controlled DM, hypertension and hyperlipidemia.  She takes a daily asprin, statin, and PO DM medication.       Current Facility-Administered Medications  Medication Dose Route Frequency Provider Last Rate Last Dose  . 0.9 %  sodium chloride infusion  250 mL Intravenous PRN Rosemarie Ax, MD      . acetaminophen (TYLENOL) tablet 650 mg  650 mg Oral Q6H PRN Elberta Leatherwood, MD       Or  . acetaminophen (TYLENOL) suppository 650 mg  650 mg Rectal Q6H PRN Elberta Leatherwood, MD      . amLODipine (NORVASC) tablet 10 mg  10 mg Oral Daily Aquilla Hacker, MD   10 mg at 05/15/14 [id number]  . aspirin EC tablet 81 mg  81 mg Oral Daily Rosemarie Ax, MD   81 mg at 05/15/14 [id number]  . atorvastatin (LIPITOR) tablet 40 mg  40 mg Oral Daily Aquilla Hacker, MD      . heparin injection 5,000 Units  5,000 Units Subcutaneous 3 times per day Elberta Leatherwood, MD   5,000 Units at 05/15/14 0535  . HYDROcodone-acetaminophen (NORCO/VICODIN) 5-325 MG per tablet 1-2 tablet  1-2 tablet Oral Q4H PRN Elberta Leatherwood, MD   2 tablet at 05/15/14 0135  . insulin aspart (novoLOG) injection 0-15 Units  0-15 Units Subcutaneous TID WC Aquilla Hacker, MD   3 Units at 05/15/14 [phone]  . lisinopril (PRINIVIL,ZESTRIL) tablet 20 mg  20 mg Oral Daily Aquilla Hacker, MD   20 mg at 05/15/14 1143  . piperacillin-tazobactam (ZOSYN) IVPB 3.375 g  3.375 g Intravenous 3 times per day Franky Macho, RPH   3.375 g at 05/15/14 0535  . promethazine (PHENERGAN) injection 25 mg  25 mg Intravenous Q6H PRN Aquilla Hacker, MD      . senna (SENOKOT) tablet 8.6 mg  1 tablet Oral BID Elberta Leatherwood, MD   8.6 mg at 05/15/14 [id number]  .  sodium chloride 0.9 % injection 3 mL  3 mL Intravenous Q12H Elberta Leatherwood, MD   3 mL at 05/15/14 1000  . sodium chloride 0.9 % injection 3 mL  3 mL Intravenous PRN Elberta Leatherwood, MD      . vancomycin (VANCOCIN) 1,500 mg in sodium chloride 0.9 % 500 mL IVPB  1,500 mg Intravenous Q24H Franky Macho, RPH   1,500 mg at 05/14/14 1648    Pt meds include: Statin :Yes Betablocker: No ASA: Yes Other anticoagulants/antiplatelets:   Past Medical History  Diagnosis Date  . Diabetes mellitus   . Hypertension   . Cellulitis and abscess of foot 05/13/2014    rt foot  . Positive TB test 2013    History reviewed. No pertinent past surgical history.  Social History History  Substance Use Topics  . Smoking status: Never Smoker   . Smokeless tobacco: Never Used  . Alcohol Use: No    Family History History reviewed. No pertinent family history. Poor communication skills unable to obtain  Allergies  Allergen Reactions  . Ace Inhibitors     cough  . Bactrim [Sulfamethoxazole-Trimethoprim] Nausea And Vomiting    Pt  and family verified that pt takes this medication     REVIEW OF SYSTEMS  General: [ ]  Weight loss, [ ]  Fever, [ ]  chills Neurologic: [ ]  Dizziness, [ ]  Blackouts, [ ]  Seizure [ ]  Stroke, [ ]  "Mini stroke", [ ]  Slurred speech, [ ]  Temporary blindness; [ ]  weakness in arms or legs, [ ]  Hoarseness [ ]  Dysphagia Cardiac: [ ]  Chest pain/pressure, [ ]  Shortness of breath at rest [ ]  Shortness of breath with exertion, [ ]  Atrial fibrillation or irregular heartbeat  Vascular: [ ]  Pain in legs with walking, [ ]  Pain in legs at rest, [ ]  Pain in legs at night,  [ ]  Non-healing ulcer, [ ]  Blood clot in vein/DVT,   Pulmonary: [ ]  Home oxygen, [ ]  Productive cough, [ ]  Coughing up blood, [ ]  Asthma,  [ ]  Wheezing [ ]  COPD Musculoskeletal:  [ ]  Arthritis, [ ]  Low back pain, [ ]  Joint pain Hematologic: [ ]  Easy Bruising, [ ]  Anemia; [ ]  Hepatitis Gastrointestinal: [ ]  Blood in stool, [ ]   Gastroesophageal Reflux/heartburn, Urinary: [ ]  chronic Kidney disease, [ ]  on HD - [ ]  MWF or [ ]  TTHS, [ ]  Burning with urination, [ ]  Difficulty urinating Skin: [ ]  Rashes, [ ]  Wounds Psychological: [ ]  Anxiety, [ ]  Depression  Physical Examination Filed Vitals:   05/13/14 2056 05/14/14 0528 05/14/14 2101 05/15/14 0542  BP: 144/55 129/53 134/59 172/67  Pulse: 65 63    Temp: 98.3 F (36.8 C) 98.1 F (36.7 C) 99.5 F (37.5 C) 100 F (37.8 C)  TempSrc: Oral Oral Oral Oral  Resp: 18 16 20 20   Height:      Weight:      SpO2: 100% 95% 98% 95%   Body mass index is 25.36 kg/(m^2).  General:  WDWN in NAD Gait: Normal HENT: WNL Eyes: Pupils equal Pulmonary: normal non-labored breathing , without Rales, rhonchi,  wheezing Cardiac: RRR, without  Murmurs, rubs or gallops; No carotid bruits Abdomen: soft, NT, no masses Skin: no rashes, ulcers noted;  no Gangrene , no cellulitis; right 5th toe open wounds;   Vascular Exam/Pulses:Doppler weak right DP/PT, Doppler left weak DP/ biphasic PT   Musculoskeletal: no muscle wasting or atrophy; no edema right 5th toe skin tear, malodorous  Neurologic: A&O X 3; Appropriate Affect ;  SENSATION: normal; MOTOR FUNCTION: 5/5 Symmetric Speech is fluent/normal   Significant Diagnostic Studies: CBC Lab Results  Component Value Date   WBC 9.9 05/15/2014   HGB 10.9* 05/15/2014   HCT 33.4* 05/15/2014   MCV 80.3 05/15/2014   PLT 342 05/15/2014    BMET    Component Value Date/Time   NA 136 05/15/2014 0701   K 3.4* 05/15/2014 0701   CL 102 05/15/2014 0701   CO2 25 05/15/2014 0701   GLUCOSE 221* 05/15/2014 0701   BUN 6 05/15/2014 0701   CREATININE 0.59 05/15/2014 0701   CREATININE 0.58 12/18/2013 1515   CALCIUM 8.8 05/15/2014 0701   GFRNONAA >90 05/15/2014 0701   GFRAA >90 05/15/2014 0701   Estimated Creatinine Clearance: 63.8 mL/min (by C-G formula based on Cr of 0.59).  COAG No results found for: INR, PROTIME   Non-Invasive  Vascular Imaging:  Arterial pressure indices:  +----------------+--------+------------+-------------------------+ Location    PressureBrachial  Waveform                      index                  +----------------+--------+------------+-------------------------+  Right dorsal ped65 mm Hg0.41    Severely dampened                       monophasic         +----------------+--------+------------+-------------------------+ Right post   47 mm Hg0.3     Severely dampened     tibial               monophasic         +----------------+--------+------------+-------------------------+ Left dorsal ped 72 mm Hg0.46    Severely dampened                       monophasic         +----------------+--------+------------+-------------------------+ Left post tibial70 mm Hg0.45    Severely dampened                       monophasic         +----------------+--------+------------+-------------------------+  ------------------------------------------------------------------- Summary:  - ABIs and Doppler waveforms indicate a severe reduction in arterial flow at rest. - Great toe pressures could not gbe obtained secondary to movement and pain.  [id number] foot  IMPRESSION: 1. Soft tissue laceration the involving the fifth toe. The middle and distal fifth phalanx are nondiagnostic to motion. There mild edema within the fifth proximal phalanx without enhancement or cortical destruction likely rib reflecting reactive edema. 2. Mild medial hallux sesamoiditis.    ASSESSMENT/PLAN:  PAD per doppler with non healing wound DM She will need an angiogram with runnoff to plan possible revascularization.  She is currently on Vancomycin and Zosyn.  WBC 9.9 and she is afebrile.      Laurence Slate [facility] 05/15/2014 1:12 PM  I agree with the above.  This is a 54 year old female with a wound on her right fifth toe.  She is a poorly controlled diabetic with hemoglobin A1c of 9.6.  She takes a statin for hyperlipidemia.  She is also medically managed for hypertension.  History is somewhat limited due to the language barrier.  She does state that she has had a wound for 15 days.  Doppler studies suggest poor blood flow with ankle-brachial indices in the 0.4 range.  The patient does have palpable femoral pulses bilaterally.  Her right toe does not appear salvageable.  I discussed this with the patient via an Arabic interpreter for approximately 15 minutes.  At the end of our conversation I asked if she had any questions and then I was informed by the interpreter that she speaks a different dialect, that she speaks [place] dialect Arabic.  Therefore, I am unsure of how much she comprehended.  An interpreter was not immediately available that could speak dialect.  The patient is going to need angiography to better define her arterial anatomy.  She will need some form of revascularization in an attempt at limb salvage.  I think at minimum she is going to lose her fifth toe.  Angiography will be performed either Monday or Tuesday and further recommendations will be performed based on these results.  Hopefully she will be a candidate for percutaneous intervention, however she may require surgical bypass if she has revascularization options.  We will continue to follow the patient over the weekend.  Please contact Dr. Kellie Simmering if there are any other immediate questions.  Annamarie Major

## 2014-05-16 DIAGNOSIS — I739 Peripheral vascular disease, unspecified: Secondary | ICD-10-CM | POA: Insufficient documentation

## 2014-05-16 LAB — CBC
HEMATOCRIT: 33.3 % — AB (ref 36.0–46.0)
HEMOGLOBIN: 11 g/dL — AB (ref 12.0–15.0)
MCH: 26.4 pg (ref 26.0–34.0)
MCHC: 33 g/dL (ref 30.0–36.0)
MCV: 79.9 fL (ref 78.0–100.0)
Platelets: 338 10*3/uL (ref 150–400)
RBC: 4.17 MIL/uL (ref 3.87–5.11)
RDW: 12.9 % (ref 11.5–15.5)
WBC: 9.4 10*3/uL (ref 4.0–10.5)

## 2014-05-16 LAB — BASIC METABOLIC PANEL
Anion gap: 8 (ref 5–15)
BUN: 5 mg/dL — AB (ref 6–23)
CO2: 28 mmol/L (ref 19–32)
Calcium: 9.1 mg/dL (ref 8.4–10.5)
Chloride: 101 mmol/L (ref 96–112)
Creatinine, Ser: 0.52 mg/dL (ref 0.50–1.10)
GFR calc non Af Amer: >90 mL/min (ref >90)
Glucose, Bld: 242 mg/dL — ABNORMAL HIGH (ref 70–99)
POTASSIUM: 3.4 mmol/L — AB (ref 3.5–5.1)
Sodium: 137 mmol/L (ref 135–145)

## 2014-05-16 LAB — GLUCOSE, CAPILLARY
GLUCOSE-CAPILLARY: 331 mg/dL — AB (ref 70–99)
Glucose-Capillary: 222 mg/dL — ABNORMAL HIGH (ref 70–99)
Glucose-Capillary: 275 mg/dL — ABNORMAL HIGH (ref 70–99)
Glucose-Capillary: 278 mg/dL — ABNORMAL HIGH (ref 70–99)

## 2014-05-16 MED ORDER — INSULIN GLARGINE 100 UNIT/ML ~~LOC~~ SOLN
5.0000 [IU] | Freq: Every day | SUBCUTANEOUS | Status: DC
  Administered 2014-05-16: 5 [IU] via SUBCUTANEOUS
  Filled 2014-05-16 (×2): qty 0.05

## 2014-05-16 MED ORDER — POTASSIUM CHLORIDE CRYS ER 20 MEQ PO TBCR
20.0000 meq | EXTENDED_RELEASE_TABLET | Freq: Once | ORAL | Status: AC
  Administered 2014-05-16: 20 meq via ORAL
  Filled 2014-05-16: qty 1

## 2014-05-16 NOTE — Progress Notes (Signed)
Family Medicine Teaching Service Daily Progress Note Intern Pager: [phone]  Patient name: Kimberly Lane Medical record number: [id number] Date of birth: 1960/11/17 Age: 54 y.o. Gender: female  Primary Care Provider: Marina Goodell, MD Consultants: None Code Status: Full  Pt Overview and Major Events to Date:  3/22-3/23: Admitted with infection of RLE; Vanc/zosyn started  Assessment and Plan: Kimberly Lane is a 54 y.o. female presenting with foot infection. PMH is significant for DM2, HTN, hx of primary TB and treated, HLD.   Right fifth toe necrosis: s/p blunt trauma complicated by uncontrolled diabetes and severe PAD. No osteo on MRI.  - Vanc/zosyn (3/22 - 3/24) >> Levaquin/clindamycin (3/24 -  - Blood cx NGTD - CRP 4.7, ESR 103 - will not trend these. - Norco Q4 PRN   T2DM: A1c 9.6. Doubtful whether she has been filling her medications. Very likely may need insulin if she has been compliant. Suspect insulin requirement to diminish after definitive treatment of infection.  - hold metformin and [facility]; insulin-naive prior to admission. - Continue ASA, augment statin, restart ACE - Adding lantus 5u to moderate SSI. Will need more but risk of hypoglycemia, while low, is greater than benefit of tight control right now.  Peripheral arterial disease: Severe, ABIs ~0.4. Needs revascularization for limb salvage.  - Angiography planned 3/28 or 3/29 with intervention to follow per vascular surgery  HTN: May be on multiple meds due to noncompliance. Will be careful not to precipitate hypotension. - Continue home amlodipine 66m - Restarted ACE: Lisinopril 248m High risk social situation: unsure of her understanding of her medical issues and compliance with her medications as she is a refugee and speaks [place] of Arabic. [place]   Normochromic anemia: Mild, will monitor CBC for leukocytosis.    Hypokalemia: Mild, replete prn  FEN/GI: Heart health/Carb modified diet/ NS 75 mL/hr  Prophylaxis: heparin SubQ   Disposition: Improving control of diabetes, continued pain control and monitoring necrotic extremity pending revascularization 3/28.  Subjective:  With the aid of an Arabic phone interpretor capable of interpreting a [place]ect, I found Kimberly Lane in limited pain, mostly only when palpating/walking.   Objective: Temp:  [98.7 F (37.1 C)-99.5 F (37.5 C)] 98.7 F (37.1 C) (03/25 0611) Pulse Rate:  [77-85] 77 (03/25 0611) Resp:  [17-18] 18 (03/25 0611) BP: (145-162)/(66-76) 146/66 mmHg (03/25 0929) SpO2:  [99 %-100 %] 99 % (03/25 069735Physical Exam: General: NAD, AAOx3 Cardiovascular: RRR without murmurs. 2+ radial pulses, barely palpable DP pulses bilaterally Respiratory: CTAB, appropriate rate, unlabored.  Abdomen: S, NT, ND, +BS Extremities: Tender ~1cm laceration-type wound on infero-lateral aspect of right 5th toe without drainage. Hyperpigmentation noted, though erythema is difficult to appreciate. Edema of dorsum of foot including toes. Left foot has decreased sensation. No hair growth on ANY extremities. Normal visible veins without varicosities.  Laboratory:  Recent Labs Lab 05/14/14 0645 05/15/14 0701 05/16/14 0747  WBC 7.0 9.9 9.4  HGB 10.3* 10.9* 11.0*  HCT 31.7* 33.4* 33.3*  PLT 329 342 338    Recent Labs Lab 05/13/14 1915 05/14/14 0645 05/15/14 0701 05/16/14 0747  NA 136 136 136 137  K 4.0 3.4* 3.4* 3.4*  CL 102 102 102 101  CO2 _0 BUN _1 5*  CREATININE 0.56 0.64 0.59 0.52  CALCIUM 8.9 8.6 8.8 9.1  PROT 7.7  --   --   --  BILITOT 0.6  --   --   --   ALKPHOS 76  --   --   --   ALT 11  --   --   --   AST 18  --   --   --   GLUCOSE 189* 201* 221* 242*   Blood Cx - NGTD  Wound Cx - pending collection.   ESR - 103  CRP - 4.7   Kimberly Pour, MD 05/16/2014, 11:07 AM PGY-2, [place]  Intern pager: [phone], text pages welcome

## 2014-05-16 NOTE — Plan of Care (Signed)
Problem: Phase III Progression Outcomes Goal: Voiding independently Outcome: Completed/Met Date Met:  05/16/14 Gets up to restroom with standby assist

## 2014-05-16 NOTE — Progress Notes (Signed)
Inpatient Diabetes Program Recommendations  AACE/ADA: New Consensus Statement on Inpatient Glycemic Control (2013)  Target Ranges:  Prepandial:   less than 140 mg/dL      Peak postprandial:   less than 180 mg/dL (1-2 hours)      Critically ill patients:  140 - 180 mg/dL    Results for Kimberly Lane, [facility] (MRN [id number]) as of 05/16/2014 08:13  Ref. Range 05/14/2014 20:58 05/15/2014 07:54 05/15/2014 11:44 05/15/2014 17:18 05/15/2014 22:16  Glucose-Capillary Latest Range: 70-99 mg/dL 91 186 (H) 260 (H) 387 (H) 283 (H)    Reason for assessment: elevated CBG  Diabetes history: yes Outpatient Diabetes medications: Amaryl 2mg  qam, Metformin 1000mg  bid Current orders for Inpatient glycemic control: Novolog 0-15 units tid  Please consider starting10 units Lantus qday starting now.   Gentry Fitz, RN, BA, MHA, CDE Diabetes Coordinator Inpatient Diabetes Program  [phone] (Team Pager) [phone] [facility]) 05/16/2014 8:14 AM

## 2014-05-16 NOTE — Progress Notes (Signed)
Teaching services contacted earlier in shift about blood pressure; states she has been running that throughout her stay and to monitor.  Gets Norvasc and lisinopril this am.

## 2014-05-17 LAB — GLUCOSE, CAPILLARY
GLUCOSE-CAPILLARY: 228 mg/dL — AB (ref 70–99)
Glucose-Capillary: 236 mg/dL — ABNORMAL HIGH (ref 70–99)
Glucose-Capillary: 236 mg/dL — ABNORMAL HIGH (ref 70–99)
Glucose-Capillary: 216 mg/dL — ABNORMAL HIGH (ref 70–99)

## 2014-05-17 LAB — CBC
HCT: 34.1 % — ABNORMAL LOW (ref 36.0–46.0)
Hemoglobin: 11 g/dL — ABNORMAL LOW (ref 12.0–15.0)
MCH: 26.3 pg (ref 26.0–34.0)
MCHC: 32.3 g/dL (ref 30.0–36.0)
MCV: 81.4 fL (ref 78.0–100.0)
Platelets: 361 10*3/uL (ref 150–400)
RBC: 4.19 MIL/uL (ref 3.87–5.11)
RDW: 13 % (ref 11.5–15.5)
WBC: 10.6 10*3/uL — AB (ref 4.0–10.5)

## 2014-05-17 MED ORDER — INSULIN GLARGINE 100 UNIT/ML ~~LOC~~ SOLN
12.0000 [IU] | Freq: Every day | SUBCUTANEOUS | Status: DC
  Administered 2014-05-17 – 2014-05-18 (×2): 12 [IU] via SUBCUTANEOUS
  Filled 2014-05-17 (×3): qty 0.12

## 2014-05-17 NOTE — Progress Notes (Signed)
Patient ID: Kimberly Lane, female   DOB: 1961-02-13, 54 y.o.   MRN: [id number] Dr. Trula Slade will see patient on Monday to decide timing of angiography and possible revascularization plus possible need for toe amp

## 2014-05-17 NOTE — Progress Notes (Signed)
Family Medicine Teaching Service Daily Progress Note Intern Pager: [phone]  Patient name: Kimberly Lane Medical record number: [id number] Date of birth: 1960/06/27 Age: 54 y.o. Gender: female  Primary Care Provider: Marina Goodell, MD Consultants: None Code Status: Full  Pt Overview and Major Events to Date:  3/22- Admitted with infection of RLE; Vanc/zosyn started 3/24- Vascular consulted for PAD and concern for gangrene  Assessment and Plan: Kimberly Lane is a 54 y.o. female presenting with foot infection. PMH is significant for DM2, HTN, hx of primary TB and treated, HLD.   Right fifth toe necrosis: s/p blunt trauma complicated by uncontrolled diabetes and severe PAD. No osteo on MRI.  - Vanc/zosyn (3/22 - 3/24) >> Levaquin/clindamycin (3/24 -  - Blood cx NGTD - CRP 4.7, ESR 103 - will not trend these. - Norco Q4 PRN  - Revascularization per [facility] following angiogram on Monday  T2DM: A1c 9.6. Doubtful whether she has been filling her medications. Very likely may need insulin if she has been compliant. Suspect insulin requirement to diminish after definitive treatment of infection.  - hold metformin and [facility]; insulin-naive prior to admission. - Continue ASA, augment statin, restart ACE - Increase lantus to 12u and continue moderate SSI  Peripheral arterial disease: Severe, ABIs ~0.4. Needs revascularization for limb salvage.  - Angiography planned 3/28 or 3/29 with intervention to follow per vascular surgery  HTN: May be on multiple meds due to noncompliance. Will be careful not to precipitate hypotension. - Continue home amlodipine 82m - Restarted ACE: Lisinopril 228m High risk social situation: unsure of her understanding of her medical issues and compliance with her medications as she is a refugee and speaks [place] of Arabic. [place]   Normochromic anemia: Mild, will  monitor CBC for leukocytosis.   Hypokalemia: Mild, replete prn  FEN/GI: Heart health/Carb modified diet/ NS 75 mL/hr  Prophylaxis: heparin SubQ   Disposition: Improving control of diabetes, continued pain control and monitoring necrotic extremity pending revascularization 3/28.  Subjective:  NAEON. No new complaints.  Objective: Temp:  [98.6 F (37 C)-98.9 F (37.2 C)] 98.6 F (37 C) (03/26 0530) Pulse Rate:  [76-84] 76 (03/26 0530) Resp:  [16] 16 (03/26 0530) BP: (144-156)/(61-74) 156/61 mmHg (03/26 0530) SpO2:  [98 %-100 %] 98 % (03/26 0530) Physical Exam: General: NAD, AAOx3 Cardiovascular: RRR without murmurs. 2+ radial pulses, unable to palpate DP pulses bilaterally Respiratory: CTAB, appropriate rate, unlabored.  Abdomen: S, NT, ND, +BS Extremities: Tender ~1cm laceration-type wound on infero-lateral aspect of right 5th toe without drainage. Hyperpigmentation noted, though erythema is difficult to appreciate. Edema of dorsum of foot including toes. Left foot has decreased sensation. No hair growth on ANY extremities. Normal visible veins without varicosities.  Laboratory:  Recent Labs Lab 05/15/14 0701 05/16/14 0747 05/17/14 0517  WBC 9.9 9.4 10.6*  HGB 10.9* 11.0* 11.0*  HCT 33.4* 33.3* 34.1*  PLT 342 338 361    Recent Labs Lab 05/13/14 1915 05/14/14 0645 05/15/14 0701 05/16/14 0747  NA 136 136 136 137  K 4.0 3.4* 3.4* 3.4*  CL 102 102 102 101  CO2 '28 28 25 28  ' BUN '6 7 6 ' 5*  CREATININE 0.56 0.64 0.59 0.52  CALCIUM 8.9 8.6 8.8 9.1  PROT 7.7  --   --   --   BILITOT 0.6  --   --   --  ALKPHOS 76  --   --   --   ALT 11  --   --   --   AST 18  --   --   --   GLUCOSE 189* 201* 221* 242*   Blood Cx - NGTD  ESR - 103  CRP - 4.7   Frazier Richards, MD 05/17/2014, 8:40 AM PGY-2, [place] Intern pager: [phone], text pages welcome

## 2014-05-18 DIAGNOSIS — L03031 Cellulitis of right toe: Secondary | ICD-10-CM | POA: Insufficient documentation

## 2014-05-18 DIAGNOSIS — I739 Peripheral vascular disease, unspecified: Secondary | ICD-10-CM

## 2014-05-18 DIAGNOSIS — E1165 Type 2 diabetes mellitus with hyperglycemia: Secondary | ICD-10-CM

## 2014-05-18 LAB — GLUCOSE, CAPILLARY
GLUCOSE-CAPILLARY: 305 mg/dL — AB (ref 70–99)
GLUCOSE-CAPILLARY: 233 mg/dL — AB (ref 70–99)
Glucose-Capillary: 352 mg/dL — ABNORMAL HIGH (ref 70–99)
Glucose-Capillary: 341 mg/dL — ABNORMAL HIGH (ref 70–99)

## 2014-05-18 LAB — CBC
HCT: 35.4 % — ABNORMAL LOW (ref 36.0–46.0)
Hemoglobin: 11.7 g/dL — ABNORMAL LOW (ref 12.0–15.0)
MCH: 26.5 pg (ref 26.0–34.0)
MCHC: 33.1 g/dL (ref 30.0–36.0)
MCV: 80.3 fL (ref 78.0–100.0)
PLATELETS: 408 10*3/uL — AB (ref 150–400)
RBC: 4.41 MIL/uL (ref 3.87–5.11)
RDW: 13.1 % (ref 11.5–15.5)
WBC: 10.1 10*3/uL (ref 4.0–10.5)

## 2014-05-18 LAB — CLOSTRIDIUM DIFFICILE BY PCR: Toxigenic C. Difficile by PCR: NEGATIVE

## 2014-05-18 MED ORDER — HYDROCODONE-ACETAMINOPHEN 5-325 MG PO TABS
1.0000 | ORAL_TABLET | ORAL | Status: DC | PRN
  Administered 2014-05-18 – 2014-05-21 (×10): 2 via ORAL
  Filled 2014-05-18 (×10): qty 2

## 2014-05-18 MED ORDER — HYDROCODONE-ACETAMINOPHEN 5-325 MG PO TABS: 1.0000 | ORAL_TABLET | ORAL | Status: DC | PRN

## 2014-05-18 NOTE — Progress Notes (Signed)
Family Medicine Teaching Service Daily Progress Note Intern Pager: [phone]  Patient name: Kimberly Lane Medical record number: [id number] Date of birth: 1960-03-19 Age: 54 y.o. Gender: female  Primary Care Provider: Marina Goodell, MD Consultants: None Code Status: Full  Pt Overview and Major Events to Date:  3/22- Admitted with infection of RLE; Vanc/zosyn started 3/24- Vascular consulted for PAD and concern for gangrene  ABX/Culture Vanc 3/22 >> 3/24 Zosyn 3/22 >> 3/24 Levaquin 3/24 >> Clinda 3/24 >>  Blood cx 3/22 >> NGTD   Assessment and Plan: Gabi Mcfate is a 54 y.o. female presenting with foot infection. PMH is significant for DM2, HTN, hx of primary TB and treated, HLD.   #Right fifth toe necrosis: s/p blunt trauma complicated by uncontrolled diabetes and severe PAD. No osteo on MRI.  - Norco Q4 PRN  - Vascular recs: Revascularization following angiogram on Monday  #T2DM: A1c 9.6. Doubtful whether she has been filling her medications. Very likely may need insulin if she has been compliant. Suspect insulin requirement to diminish after definitive treatment of infection.  - hold metformin and [facility]; insulin-naive prior to admission. - Increase lantus to 12u and continue moderate SSI  #Peripheral arterial disease: Severe, ABIs ~0.4. Needs revascularization for limb salvage.  - Angiography planned 3/28 or 3/29 with intervention to follow per vascular surgery  #HTN: May be on multiple meds due to noncompliance. Will be careful not to precipitate hypotension. - Continue home amlodipine 52m - Restarted ACE: Lisinopril 217m #High risk social situation: unsure of her understanding of her medical issues and compliance with her medications as she is a refugee and speaks [place] Arabic. [place]   #Normochromic anemia: Mild, will monitor CBC for leukocytosis.    #Hypokalemia: 3.4 - BMP AM   FEN/GI: Heart health/Carb modified diet/ Saline lock  Prophylaxis: heparin SubQ   Disposition: Improving control of diabetes, continued pain control and monitoring necrotic extremity pending revascularization 3/28.  Subjective:  Reports having pain in her foot. It is improved with medications and she is able to sleep with them.   Objective: Temp:  [98.1 F (36.7 C)-98.9 F (37.2 C)] 98.2 F (36.8 C) (03/27 0541) Pulse Rate:  [68-79] 79 (03/26 1318) Resp:  [16-18] 16 (03/27 0541) BP: (118-160)/(54-72) 118/59 mmHg (03/27 0541) SpO2:  [92 %-100 %] 99 % (03/27 0541) Physical Exam: General: NAD, AAOx3 Cardiovascular: RRR without murmurs. 2+ radial pulses, unable to palpate DP pulses bilaterally Respiratory: CTAB, appropriate rate, unlabored.  Abdomen: S, NT, ND, +BS Extremities: Tender ~1cm laceration-type wound on infero-lateral aspect of right 5th toe without drainage. Hyperpigmentation noted, though erythema is difficult to appreciate. Edema of dorsum of foot including toes. Left foot has decreased sensation. No hair growth on ANY extremities. Normal visible veins without varicosities.  Laboratory:  Recent Labs Lab 05/16/14 0747 05/17/14 0517 05/18/14 0652  WBC 9.4 10.6* 10.1  HGB 11.0* 11.0* 11.7*  HCT 33.3* 34.1* 35.4*  PLT 338 361 408*    Recent Labs Lab 05/13/14 1915 05/14/14 0645 05/15/14 0701 05/16/14 0747  NA 136 136 136 137  K 4.0 3.4* 3.4* 3.4*  CL 102 102 102 101  CO2 '28 28 25 28  ' BUN '6 7 6 ' 5*  CREATININE 0.56 0.64 0.59 0.52  CALCIUM 8.9 8.6 8.8 9.1  PROT 7.7  --   --   --   BILITOT 0.6  --   --   --  ALKPHOS 76  --   --   --   ALT 11  --   --   --   AST 18  --   --   --   GLUCOSE 189* 201* 221* 242*   Blood Cx - NGTD  ESR - 103  CRP - 4.7   Rosemarie Ax, MD 05/18/2014, 8:23 AM PGY-2, [place] Intern pager: [phone], text pages welcome

## 2014-05-19 ENCOUNTER — Inpatient Hospital Stay: Payer: No Typology Code available for payment source | Admitting: Family Medicine

## 2014-05-19 DIAGNOSIS — Z01810 Encounter for preprocedural cardiovascular examination: Secondary | ICD-10-CM

## 2014-05-19 DIAGNOSIS — M869 Osteomyelitis, unspecified: Secondary | ICD-10-CM

## 2014-05-19 LAB — GLUCOSE, CAPILLARY
Glucose-Capillary: 283 mg/dL — ABNORMAL HIGH (ref 70–99)
Glucose-Capillary: 87 mg/dL (ref 70–99)
Glucose-Capillary: 103 mg/dL — ABNORMAL HIGH (ref 70–99)
Glucose-Capillary: 252 mg/dL — ABNORMAL HIGH (ref 70–99)

## 2014-05-19 LAB — BASIC METABOLIC PANEL
Anion gap: 9 (ref 5–15)
BUN: 8 mg/dL (ref 6–23)
CALCIUM: 8.9 mg/dL (ref 8.4–10.5)
CO2: 26 mmol/L (ref 19–32)
Chloride: 100 mmol/L (ref 96–112)
Creatinine, Ser: 0.61 mg/dL (ref 0.50–1.10)
GFR calc Af Amer: >90 mL/min (ref >90)
Glucose, Bld: 260 mg/dL — ABNORMAL HIGH (ref 70–99)
POTASSIUM: 3.1 mmol/L — AB (ref 3.5–5.1)
SODIUM: 135 mmol/L (ref 135–145)

## 2014-05-19 LAB — CBC
HCT: 30.9 % — ABNORMAL LOW (ref 36.0–46.0)
Hemoglobin: 10 g/dL — ABNORMAL LOW (ref 12.0–15.0)
MCH: 25.8 pg — ABNORMAL LOW (ref 26.0–34.0)
MCHC: 32.4 g/dL (ref 30.0–36.0)
MCV: 79.8 fL (ref 78.0–100.0)
PLATELETS: 363 10*3/uL (ref 150–400)
RBC: 3.87 MIL/uL (ref 3.87–5.11)
RDW: 13.2 % (ref 11.5–15.5)
WBC: 9.4 10*3/uL (ref 4.0–10.5)

## 2014-05-19 MED ORDER — POTASSIUM CHLORIDE CRYS ER 20 MEQ PO TBCR
40.0000 meq | EXTENDED_RELEASE_TABLET | Freq: Two times a day (BID) | ORAL | Status: AC
  Administered 2014-05-19 (×2): 40 meq via ORAL
  Filled 2014-05-19 (×2): qty 2

## 2014-05-19 MED ORDER — INSULIN GLARGINE 100 UNIT/ML ~~LOC~~ SOLN
15.0000 [IU] | Freq: Every day | SUBCUTANEOUS | Status: DC
  Administered 2014-05-19 – 2014-05-25 (×6): 15 [IU] via SUBCUTANEOUS
  Filled 2014-05-19 (×7): qty 0.15

## 2014-05-19 NOTE — Progress Notes (Signed)
Inpatient Diabetes Program Recommendations  AACE/ADA: New Consensus Statement on Inpatient Glycemic Control (2013)  Target Ranges:  Prepandial:   less than 140 mg/dL      Peak postprandial:   less than 180 mg/dL (1-2 hours)      Critically ill patients:  140 - 180 mg/dL   Results for Kimberly Lane, Kimberly Lane (MRN [id number]) as of 05/19/2014 12:39  Ref. Range 05/18/2014 07:56 05/18/2014 12:23 05/18/2014 16:46 05/18/2014 22:13 05/19/2014 08:04 05/19/2014 11:58  Glucose-Capillary Latest Range: 70-99 mg/dL 305 (H) 233 (H) 352 (H) 341 (H) 283 (H) 87    Diabetes history: DM2 Outpatient Diabetes medications: Amaryl 2 mg QAM, Metformin 1000 mg BID Current orders for Inpatient glycemic control: Lantus 15 units daily, Novolog 0-15 units TID with meals  Inpatient Diabetes Program Recommendations Insulin - Basal: Noted Lantus was increased from 12 units daily to 15 units daily.  Correction (SSI): Please consider ordering Novolog bedtime correction scale. When patient is made NPO, may want to consider ordering CBGs and Novolog correction Q4H.  Thanks, Barnie Alderman, RN, MSN, CCRN, CDE Diabetes Coordinator Inpatient Diabetes Program [phone] (Team Pager from [place] to [place]) [phone] ([place] office) [phone] [facility] office)

## 2014-05-19 NOTE — Progress Notes (Signed)
Family Medicine Teaching Service Daily Progress Note Intern Pager: [phone]  Patient name: Kimberly Lane Medical record number: [id number] Date of birth: 22-Apr-1960 Age: 54 y.o. Gender: female  Primary Care Provider: Marina Goodell, MD Consultants: None Code Status: Full  Pt Overview and Major Events to Date:  3/22- Admitted with infection of RLE; Vanc/zosyn started 3/24- Vascular consulted for PAD and concern for gangrene  ABX/Culture Vanc 3/22 >> 3/24 Zosyn 3/22 >> 3/24 Levaquin 3/24 >> Clinda 3/24 >>  Blood cx 3/22 >> NGTD   Assessment and Plan: Kimberly Lane is a 54 y.o. female presenting with foot infection. PMH is significant for DM2, HTN, hx of primary TB and treated, HLD.   #Dry Gangrene, right fifth toe: s/p blunt trauma complicated by uncontrolled diabetes and severe PAD. No osteo on MRI.  - Norco Q4 PRN  - Vascular recs: angiogram tomorrow 3/29.   -Requires amputation of the right fifth toe, however prior to proceeding, she is going to need revascularization.  #T2DM: A1c 9.6. Doubtful whether she has been filling her medications. Very likely may need insulin if she has been compliant. Suspect insulin requirement to diminish after definitive treatment of infection.  - CBGS last 24hrs 230-380s - hold metformin and [facility]; insulin-naive prior to admission. - Increase lantus to 15u and continue moderate SSI  #Peripheral arterial disease: Severe, ABIs ~0.4. Needs revascularization for limb salvage.  - Angiography planned 3/29 with intervention to follow per vascular surgery  #HTN: May be on multiple meds due to noncompliance. Will be careful not to precipitate hypotension. Elevated today.  - Continue amlodipine 34m and lisinopril 220m-consider increasing if patient remains elevated. Home Lisinopril 4027maily.   #High risk social situation: unsure of her understanding of her medical issues and compliance with her medications as she is a refugee and speaks [place]  Arabic. [place]   #Normochromic anemia: Mild, prior to admission baseline 12-13. Stable.  - has been stable around 10-11 here. - monitor for signs of bleeding  #Hypokalemia: 3.1 tody. - replete with K-dur - monitor daily.  FEN/GI: Heart health/Carb modified diet/ Saline lock  Prophylaxis: heparin SubQ   Disposition: Improving control of diabetes, continued pain control and pending revascularization and amputation.  Subjective:  Reports having pain in her foot intermittently. It is improved with medications. No overnight events. Patient is aware of toe amputation and revascularization surgery.  Phone interpretor used.   Objective: Temp:  [98 F (36.7 C)-99.8 F (37.7 C)] 98.6 F (37 C) (03/28 0558) Pulse Rate:  [72-88] 72 (03/28 0558) Resp:  [16-18] 18 (03/28 0558) BP: (123-155)/(58-79) 146/67 mmHg (03/28 0558) SpO2:  [93 %-100 %] 97 % (03/28 0558) Physical Exam: General: NAD, awake and alert Cardiovascular: RRR without murmurs. 2+ radial pulses, unable to palpate DP pulses bilaterally Respiratory: CTAB, appropriate rate, unlabored.  Abdomen: S, NT, ND, +BS Extremities: Tender ~1cm laceration-type wound on infero-lateral aspect of right 5th toe without drainage. Hyperpigmentation noted, though erythema is difficult to appreciate. Edema of dorsum of foot including toes. Left foot has decreased sensation. No hair growth on ANY extremities.   Laboratory:  Recent Labs Lab 05/16/14 0747 05/17/14 0517 05/18/14 0652  WBC 9.4 10.6* 10.1  HGB 11.0* 11.0* 11.7*  HCT 33.3* 34.1* 35.4*  PLT 338 361 408*    Recent Labs Lab 05/13/14 1915 05/14/14 0645 05/15/14 0701 05/16/14 0747  NA 136  136 136 137  K 4.0 3.4* 3.4* 3.4*  CL 102 102 102 101  CO2 '28 28 25 28  ' BUN '6 7 6 ' 5*  CREATININE 0.56 0.64 0.59 0.52  CALCIUM 8.9 8.6 8.8 9.1  PROT 7.7  --   --   --   BILITOT 0.6  --    --   --   ALKPHOS 76  --   --   --   ALT 11  --   --   --   AST 18  --   --   --   GLUCOSE 189* 201* 221* 242*   Blood Cx - NGTD  ESR - 103  CRP - 4.7   Katheren Shams, DO 05/19/2014, 7:19 AM PGY-1, [place] Intern pager: [phone], text pages welcome

## 2014-05-19 NOTE — Progress Notes (Signed)
Subjective  -   Complaining of pain in her right leg and toe   Physical Exam:  Palpable femoral pulses bilaterally.  Pedal pulses are not palpable. Respirations are nonlabored Abdomen soft. Extremities: Dry gangrene to right fifth toe   Assessment/Plan:    With the assistance of the some mildly an Arabic interpreter via telephone, I had a lengthy discussion with the patient regarding the severity of her problem.  I discussed that she has poor circulation secondary to her diabetes and that this has impacted her ability to heal the wound in her right fifth toe.  She will require amputation of the right fifth toe, however prior to proceeding, she is going to need revascularization.  I discussed proceeding with angiography tomorrow.  The details of the procedure were discussed with the patient via the interpreter.  All of her questions were answered.  I did discuss that if a percutaneous intervention could be performed that it would be done tomorrow.  If not, the patient would require surgical revascularization.  I did state that this is a limb threatening problem.  I spent approximately 45 minutes with the patient.  Lorene Klimas IV, V. WELLS 05/19/2014 9:02 AM --  Filed Vitals:   05/19/14 0558  BP: 146/67  Pulse: 72  Temp: 98.6 F (37 C)  Resp: 18    Intake/Output Summary (Last 24 hours) at 05/19/14 0902 Last data filed at 05/18/14 1110  Gross per 24 hour  Intake      0 ml  Output    400 ml  Net   -400 ml     Laboratory CBC    Component Value Date/Time   WBC 10.1 05/18/2014 0652   HGB 11.7* 05/18/2014 0652   HCT 35.4* 05/18/2014 0652   PLT 408* 05/18/2014 0652    BMET    Component Value Date/Time   NA 137 05/16/2014 0747   K 3.4* 05/16/2014 0747   CL 101 05/16/2014 0747   CO2 28 05/16/2014 0747   GLUCOSE 242* 05/16/2014 0747   BUN 5* 05/16/2014 0747   CREATININE 0.52 05/16/2014 0747   CREATININE 0.58 12/18/2013 1515   CALCIUM 9.1 05/16/2014 0747   GFRNONAA  >90 05/16/2014 0747   GFRAA >90 05/16/2014 0747    COAG No results found for: INR, PROTIME No results found for: PTT  Antibiotics Anti-infectives    Start     Dose/Rate Route Frequency Ordered Stop   05/15/14 1715  clindamycin (CLEOCIN) capsule 450 mg     450 mg Oral 3 times per day 05/15/14 1711     05/15/14 1715  levofloxacin (LEVAQUIN) tablet 750 mg     750 mg Oral Daily 05/15/14 1711     05/13/14 1700  piperacillin-tazobactam (ZOSYN) IVPB 3.375 g  Status:  Discontinued     3.375 g 12.5 mL/hr over 240 Minutes Intravenous 3 times per day 05/13/14 1628 05/15/14 1711   05/13/14 1700  vancomycin (VANCOCIN) 1,500 mg in sodium chloride 0.9 % 500 mL IVPB  Status:  Discontinued     1,500 mg 250 mL/hr over 120 Minutes Intravenous Every 24 hours 05/13/14 1628 05/15/14 1711   05/13/14 1615  piperacillin-tazobactam (ZOSYN) IVPB 3.375 g  Status:  Discontinued     3.375 g 100 mL/hr over 30 Minutes Intravenous  Once 05/13/14 1613 05/13/14 1628   05/13/14 1615  vancomycin (VANCOCIN) IVPB 1000 mg/200 mL premix  Status:  Discontinued     1,000 mg 200 mL/hr over 60 Minutes Intravenous  Once  05/13/14 1613 05/13/14 1628       V. Leia Alf, M.D. [facility] Office: [phone] Pager:  [phone]

## 2014-05-19 NOTE — Progress Notes (Addendum)
Used sudanese interpretor [phone] to assess patients pain level, explain NPO status and explain procedure. Consent signed with all questions answered. Patient verbalizes understanding to interpretor.

## 2014-05-20 ENCOUNTER — Encounter (HOSPITAL_COMMUNITY)
Admission: AD | Disposition: A | Discharge status: Rehabilitation Facility | Payer: Self-pay | Source: Ambulatory Visit | Attending: Family Medicine

## 2014-05-20 HISTORY — PX: ABDOMINAL AORTAGRAM: SHX5454

## 2014-05-20 LAB — CBC
HCT: 31.6 % — ABNORMAL LOW (ref 36.0–46.0)
Hemoglobin: 10.2 g/dL — ABNORMAL LOW (ref 12.0–15.0)
MCH: 26 pg (ref 26.0–34.0)
MCHC: 32.3 g/dL (ref 30.0–36.0)
MCV: 80.4 fL (ref 78.0–100.0)
PLATELETS: 372 10*3/uL (ref 150–400)
RBC: 3.93 MIL/uL (ref 3.87–5.11)
RDW: 13.1 % (ref 11.5–15.5)
WBC: 9.1 10*3/uL (ref 4.0–10.5)

## 2014-05-20 LAB — BASIC METABOLIC PANEL
Anion gap: 5 (ref 5–15)
BUN: 8 mg/dL (ref 6–23)
CHLORIDE: 104 mmol/L (ref 96–112)
CO2: 27 mmol/L (ref 19–32)
Calcium: 9 mg/dL (ref 8.4–10.5)
Creatinine, Ser: 0.55 mg/dL (ref 0.50–1.10)
GFR calc Af Amer: >90 mL/min (ref >90)
Glucose, Bld: 227 mg/dL — ABNORMAL HIGH (ref 70–99)
POTASSIUM: 3.9 mmol/L (ref 3.5–5.1)
SODIUM: 136 mmol/L (ref 135–145)

## 2014-05-20 LAB — GLUCOSE, CAPILLARY
GLUCOSE-CAPILLARY: 99 mg/dL (ref 70–99)
GLUCOSE-CAPILLARY: 91 mg/dL (ref 70–99)
Glucose-Capillary: 190 mg/dL — ABNORMAL HIGH (ref 70–99)
Glucose-Capillary: 114 mg/dL — ABNORMAL HIGH (ref 70–99)
Glucose-Capillary: 254 mg/dL — ABNORMAL HIGH (ref 70–99)

## 2014-05-20 LAB — CULTURE, BLOOD (ROUTINE X 2)
Culture: NO GROWTH
Culture: NO GROWTH

## 2014-05-20 SURGERY — ABDOMINAL AORTAGRAM

## 2014-05-20 MED ORDER — ONDANSETRON HCL 4 MG/2ML IJ SOLN: 4.0000 mg | Freq: Four times a day (QID) | INTRAMUSCULAR | Status: DC | PRN

## 2014-05-20 MED ORDER — LIDOCAINE HCL (PF) 1 % IJ SOLN
INTRAMUSCULAR | Status: AC
  Filled 2014-05-20: qty 30

## 2014-05-20 MED ORDER — SODIUM CHLORIDE 0.9 % IV SOLN
INTRAVENOUS | Status: DC
  Administered 2014-05-20: 18:00:00 via INTRAVENOUS

## 2014-05-20 MED ORDER — ALUM & MAG HYDROXIDE-SIMETH 200-200-20 MG/5ML PO SUSP: 15.0000 mL | ORAL | Status: DC | PRN

## 2014-05-20 MED ORDER — MIDAZOLAM HCL 2 MG/2ML IJ SOLN
INTRAMUSCULAR | Status: AC
  Filled 2014-05-20: qty 2

## 2014-05-20 MED ORDER — HYDRALAZINE HCL 20 MG/ML IJ SOLN
5.0000 mg | INTRAMUSCULAR | Status: DC | PRN
Start: 2014-05-20 — End: 2014-05-21

## 2014-05-20 MED ORDER — LABETALOL HCL 5 MG/ML IV SOLN
10.0000 mg | INTRAVENOUS | Status: DC | PRN
  Filled 2014-05-20: qty 4

## 2014-05-20 MED ORDER — DOCUSATE SODIUM 100 MG PO CAPS
100.0000 mg | ORAL_CAPSULE | Freq: Every day | ORAL | Status: DC
  Administered 2014-05-21: 100 mg via ORAL
  Filled 2014-05-20: qty 1

## 2014-05-20 MED ORDER — SODIUM CHLORIDE 0.9 % IV SOLN
INTRAVENOUS | Status: DC
  Administered 2014-05-20 – 2014-05-21 (×3): via INTRAVENOUS

## 2014-05-20 MED ORDER — PHENOL 1.4 % MT LIQD
1.0000 | OROMUCOSAL | Status: DC | PRN
  Filled 2014-05-20: qty 177

## 2014-05-20 MED ORDER — GUAIFENESIN-DM 100-10 MG/5ML PO SYRP: 15.0000 mL | ORAL_SOLUTION | ORAL | Status: DC | PRN

## 2014-05-20 MED ORDER — METOPROLOL TARTRATE 1 MG/ML IV SOLN: 2.0000 mg | INTRAVENOUS | Status: DC | PRN

## 2014-05-20 MED ORDER — FENTANYL CITRATE 0.05 MG/ML IJ SOLN
INTRAMUSCULAR | Status: AC
  Filled 2014-05-20: qty 2

## 2014-05-20 MED ORDER — HEPARIN (PORCINE) IN NACL 2-0.9 UNIT/ML-% IJ SOLN
INTRAMUSCULAR | Status: AC
  Filled 2014-05-20: qty 1000

## 2014-05-20 SURGICAL SUPPLY — 53 items
BANDAGE ELASTIC 4 VELCRO ST LF (GAUZE/BANDAGES/DRESSINGS) IMPLANT
BANDAGE ESMARK 6X9 LF (GAUZE/BANDAGES/DRESSINGS) IMPLANT
BNDG ESMARK 6X9 LF (GAUZE/BANDAGES/DRESSINGS)
CANISTER SUCTION 2500CC (MISCELLANEOUS) ×4 IMPLANT
CLIP TI MEDIUM 24 (CLIP) ×4 IMPLANT
CLIP TI WIDE RED SMALL 24 (CLIP) ×4 IMPLANT
COVER SURGICAL LIGHT HANDLE (MISCELLANEOUS) ×4 IMPLANT
CUFF TOURNIQUET SINGLE 24IN (TOURNIQUET CUFF) IMPLANT
CUFF TOURNIQUET SINGLE 34IN LL (TOURNIQUET CUFF) IMPLANT
CUFF TOURNIQUET SINGLE 44IN (TOURNIQUET CUFF) IMPLANT
DERMABOND ADVANCED (GAUZE/BANDAGES/DRESSINGS) ×2
DERMABOND ADVANCED .7 DNX12 (GAUZE/BANDAGES/DRESSINGS) ×2 IMPLANT
DRAIN CHANNEL 15F RND FF W/TCR (WOUND CARE) IMPLANT
DRAPE WARM FLUID 44X44 (DRAPE) ×4 IMPLANT
DRAPE X-RAY CASS 24X20 (DRAPES) IMPLANT
DRSG COVADERM 4X10 (GAUZE/BANDAGES/DRESSINGS) IMPLANT
DRSG COVADERM 4X8 (GAUZE/BANDAGES/DRESSINGS) IMPLANT
ELECT REM PT RETURN 9FT ADLT (ELECTROSURGICAL) ×4
ELECTRODE REM PT RTRN 9FT ADLT (ELECTROSURGICAL) ×2 IMPLANT
EVACUATOR SILICONE 100CC (DRAIN) IMPLANT
GLOVE BIOGEL PI IND STRL 7.5 (GLOVE) ×2 IMPLANT
GLOVE BIOGEL PI INDICATOR 7.5 (GLOVE) ×2
GLOVE SURG SS PI 7.5 STRL IVOR (GLOVE) ×4 IMPLANT
GOWN PREVENTION PLUS XXLARGE (GOWN DISPOSABLE) ×4 IMPLANT
GOWN STRL NON-REIN LRG LVL3 (GOWN DISPOSABLE) ×12 IMPLANT
HEMOSTAT SNOW SURGICEL 2X4 (HEMOSTASIS) IMPLANT
KIT BASIN OR (CUSTOM PROCEDURE TRAY) ×4 IMPLANT
KIT ROOM TURNOVER OR (KITS) ×4 IMPLANT
MARKER GRAFT CORONARY BYPASS (MISCELLANEOUS) IMPLANT
NS IRRIG 1000ML POUR BTL (IV SOLUTION) ×8 IMPLANT
PACK PERIPHERAL VASCULAR (CUSTOM PROCEDURE TRAY) ×4 IMPLANT
PAD ARMBOARD 7.5X6 YLW CONV (MISCELLANEOUS) ×8 IMPLANT
PADDING CAST COTTON 6X4 STRL (CAST SUPPLIES) IMPLANT
SET COLLECT BLD 21X3/4 12 (NEEDLE) IMPLANT
STOPCOCK 4 WAY LG BORE MALE ST (IV SETS) IMPLANT
SUT ETHILON 3 0 PS 1 (SUTURE) IMPLANT
SUT PROLENE 5 0 C 1 24 (SUTURE) ×4 IMPLANT
SUT PROLENE 6 0 BV (SUTURE) ×4 IMPLANT
SUT PROLENE 7 0 BV 1 (SUTURE) IMPLANT
SUT SILK 2 0 SH (SUTURE) ×4 IMPLANT
SUT SILK 3 0 (SUTURE)
SUT SILK 3-0 18XBRD TIE 12 (SUTURE) IMPLANT
SUT VIC AB 2-0 CT1 27 (SUTURE) ×4
SUT VIC AB 2-0 CT1 TAPERPNT 27 (SUTURE) ×4 IMPLANT
SUT VIC AB 3-0 SH 27 (SUTURE) ×4
SUT VIC AB 3-0 SH 27X BRD (SUTURE) ×4 IMPLANT
SUT VICRYL 4-0 PS2 18IN ABS (SUTURE) ×8 IMPLANT
TOWEL OR 17X24 6PK STRL BLUE (TOWEL DISPOSABLE) ×8 IMPLANT
TOWEL OR 17X26 10 PK STRL BLUE (TOWEL DISPOSABLE) ×8 IMPLANT
TRAY FOLEY CATH 16FRSI W/METER (SET/KITS/TRAYS/PACK) ×4 IMPLANT
TUBING EXTENTION W/L.L. (IV SETS) IMPLANT
UNDERPAD 30X30 INCONTINENT (UNDERPADS AND DIAPERS) ×4 IMPLANT
WATER STERILE IRR 1000ML POUR (IV SOLUTION) ×4 IMPLANT

## 2014-05-20 NOTE — Interval H&P Note (Signed)
History and Physical Interval Note:  05/20/2014 12:54 PM  Kimberly Lane  has presented today for surgery, with the diagnosis of pvd w/right toe ulcer  The various methods of treatment have been discussed with the patient and family. After consideration of risks, benefits and other options for treatment, the patient has consented to  Procedure(s): ABDOMINAL AORTAGRAM (N/A) as a surgical intervention .  The patient's history has been reviewed, patient examined, no change in status, stable for surgery.  I have reviewed the patient's chart and labs.  Questions were answered to the patient's satisfaction.     Jeanetta Alonzo IV, V. WELLS

## 2014-05-20 NOTE — H&P (View-Only) (Signed)
Subjective  -   Complaining of pain in her right leg and toe   Physical Exam:  Palpable femoral pulses bilaterally.  Pedal pulses are not palpable. Respirations are nonlabored Abdomen soft. Extremities: Dry gangrene to right fifth toe   Assessment/Plan:    With the assistance of the some mildly an Arabic interpreter via telephone, I had a lengthy discussion with the patient regarding the severity of her problem.  I discussed that she has poor circulation secondary to her diabetes and that this has impacted her ability to heal the wound in her right fifth toe.  She will require amputation of the right fifth toe, however prior to proceeding, she is going to need revascularization.  I discussed proceeding with angiography tomorrow.  The details of the procedure were discussed with the patient via the interpreter.  All of her questions were answered.  I did discuss that if a percutaneous intervention could be performed that it would be done tomorrow.  If not, the patient would require surgical revascularization.  I did state that this is a limb threatening problem.  I spent approximately 45 minutes with the patient.  Dequita Schleicher IV, V. WELLS 05/19/2014 9:02 AM --  Filed Vitals:   05/19/14 0558  BP: 146/67  Pulse: 72  Temp: 98.6 F (37 C)  Resp: 18    Intake/Output Summary (Last 24 hours) at 05/19/14 0902 Last data filed at 05/18/14 1110  Gross per 24 hour  Intake      0 ml  Output    400 ml  Net   -400 ml     Laboratory CBC    Component Value Date/Time   WBC 10.1 05/18/2014 0652   HGB 11.7* 05/18/2014 0652   HCT 35.4* 05/18/2014 0652   PLT 408* 05/18/2014 0652    BMET    Component Value Date/Time   NA 137 05/16/2014 0747   K 3.4* 05/16/2014 0747   CL 101 05/16/2014 0747   CO2 28 05/16/2014 0747   GLUCOSE 242* 05/16/2014 0747   BUN 5* 05/16/2014 0747   CREATININE 0.52 05/16/2014 0747   CREATININE 0.58 12/18/2013 1515   CALCIUM 9.1 05/16/2014 0747   GFRNONAA  >90 05/16/2014 0747   GFRAA >90 05/16/2014 0747    COAG No results found for: INR, PROTIME No results found for: PTT  Antibiotics Anti-infectives    Start     Dose/Rate Route Frequency Ordered Stop   05/15/14 1715  clindamycin (CLEOCIN) capsule 450 mg     450 mg Oral 3 times per day 05/15/14 1711     05/15/14 1715  levofloxacin (LEVAQUIN) tablet 750 mg     750 mg Oral Daily 05/15/14 1711     05/13/14 1700  piperacillin-tazobactam (ZOSYN) IVPB 3.375 g  Status:  Discontinued     3.375 g 12.5 mL/hr over 240 Minutes Intravenous 3 times per day 05/13/14 1628 05/15/14 1711   05/13/14 1700  vancomycin (VANCOCIN) 1,500 mg in sodium chloride 0.9 % 500 mL IVPB  Status:  Discontinued     1,500 mg 250 mL/hr over 120 Minutes Intravenous Every 24 hours 05/13/14 1628 05/15/14 1711   05/13/14 1615  piperacillin-tazobactam (ZOSYN) IVPB 3.375 g  Status:  Discontinued     3.375 g 100 mL/hr over 30 Minutes Intravenous  Once 05/13/14 1613 05/13/14 1628   05/13/14 1615  vancomycin (VANCOCIN) IVPB 1000 mg/200 mL premix  Status:  Discontinued     1,000 mg 200 mL/hr over 60 Minutes Intravenous  Once  05/13/14 1613 05/13/14 1628       V. Leia Alf, M.D. [facility] [place] Office: [phone] Pager:  [phone]

## 2014-05-20 NOTE — Progress Notes (Signed)
Family Medicine Teaching Service Daily Progress Note Intern Pager: [phone]  Patient name: Kimberly Lane Medical record number: [id number] Date of birth: [birth year] Age: 54 y.o. Gender: female  Primary Care Provider: Marina Goodell, MD Consultants: None Code Status: Full  Pt Overview and Major Events to Date:  3/22- Admitted with infection of RLE; Vanc/zosyn started 3/24- Vascular consulted for PAD and concern for gangrene  ABX/Culture Vanc 3/22 >> 3/24 Zosyn 3/22 >> 3/24 Levaquin 3/24 >> Clinda 3/24 >>  Blood cx 3/22 >> NG Final.   Assessment and Plan: Kimberly Lane is a 54 y.o. female presenting with foot infection 2/2 blunt traumatic injury to right 5th digit. PMH is significant for DM2, HTN, hx of primary TB and treated, HLD.   #Dry Gangrene, right fifth toe: s/p blunt trauma complicated by uncontrolled diabetes and severe PAD. No osteo on MRI.  - Norco Q4 PRN  - Vascular recs: angiogram tomorrow 3/29. Percutaneous revascularization vs. Surgical.  - Requires amputation of the right fifth toe, however prior to proceeding, she is going to need revascularization. - Continue clindamycin / levaquin for diabetic foot infection coverage. 2-4 weeks and pending amputation.   #T2DM: A1c 9.6. Doubtful whether she has been filling her medications. Very likely may need insulin if she has been compliant. Suspect insulin requirement to diminish after definitive treatment of infection.  - CBGS 87-190 - hold metformin and [facility]; insulin-naive prior to admission. - Increase lantus to 15u and continue moderate SSI  #Peripheral arterial disease: Severe, ABIs ~0.4. Needs revascularization for limb salvage.  - Angiography planned 3/29 with intervention to follow per vascular surgery  #HTN: May be on multiple meds due to noncompliance. Will be careful not to precipitate hypotension.  - Continue amlodipine 73m and lisinopril 233m- Normotensive here.   #High risk social situation: unsure of her  understanding of her medical issues and compliance with her medications as she is a refugee and speaks [place] of Arabic. [place].   #Normochromic anemia: Mild, prior to admission baseline 12-13. Stable.  - has been stable around 10-11 here. - Trend CBC's  #Hypokalemia: 3.9 today. From 3.1.  - replete with K-dur as needed.  - monitor daily.  FEN/GI: NPO for procedure > Heart health/Carb modified diet/ Saline lock  Prophylaxis: heparin SubQ   Disposition: Pending angiography and vascular intervention.   Subjective:  Continues with some pain. Says she is ready for the procedure today. She denies further nausea / vomiting. She has no other complaints this am.    Objective: Temp:  [98 F (36.7 C)-98.7 F (37.1 C)] 98.7 F (37.1 C) (03/29 0546) Pulse Rate:  [71-82] 77 (03/29 0546) Resp:  [16-18] 18 (03/29 0546) BP: (126-171)/(57-64) 135/58 mmHg (03/29 0546) SpO2:  [97 %-100 %] 97 % (03/29 0546) Physical Exam: General: NAD, awake and alert Cardiovascular: RRR without murmurs. 2+ radial pulses, unable to palpate DP pulses bilaterally Respiratory: CTAB, appropriate rate, unlabored.  Abdomen: S, NT, ND, +BS Extremities: Tender ~1cm laceration-type wound on infero-lateral aspect of right 5th toe without drainage. Decreased sensation on plantar surfaces of BL feet. Poor DP pulses, and 1+ palpable PT pulses BL. Cool to the touch. Swelling improving on the right.    Laboratory:  Recent Labs Lab 05/18/14 0652 05/19/14 0901 05/20/14 0508  WBC 10.1 9.4 9.1  HGB 11.7* 10.0* 10.2*  HCT 35.4* 30.9* 31.6*  PLT 408* 363  [place] Lab 05/13/14 1915  05/16/14 0747 05/19/14 0901 05/20/14 0508  NA 136  < > 137 135 136  K 4.0  < > 3.4* 3.1* 3.9  CL 102  < > 101 100 104  CO2 28  < > _0 BUN 6  < > 5* 8 8  CREATININE 0.56  < > 0.52 0.61 0.55  CALCIUM 8.9  < > 9.1  8.9 9.0  PROT 7.7  --   --   --   --   BILITOT 0.6  --   --   --   --   ALKPHOS 76  --   --   --   --   ALT 11  --   --   --   --   AST 18  --   --   --   --   GLUCOSE 189*  < > 242* 260* 227*  < > = values in this interval not displayed. Blood Cx - NGTD  ESR - 103  CRP - 4.7   Aquilla Hacker, MD 05/20/2014, 9:12 AM PGY-1, [place] Intern pager: [phone], text pages welcome

## 2014-05-20 NOTE — Progress Notes (Signed)
05/20/2014 4:35 PM  Received pt. To room 2w01 from cath lab.  Pt. Does not speak [place] but a translator is at bedside.  VSS, L groin level zero.  +pulses with doppler in both feet, DP, PT.  Oriented translator and pt. To room, call light and bed.  Instructed on bedrest and no Left leg movement.  Voiced understanding.  Instructed translator pt. Is on clear liquids. Family at bedside. [place]

## 2014-05-20 NOTE — Op Note (Signed)
    Patient name: Kimberly Lane MRN: [id number] DOB: 1960/07/27 Sex: female  05/13/2014 - 05/20/2014 Pre-operative Diagnosis: Right leg ulcer Post-operative diagnosis:  Same Surgeon:  Eldridge Abrahams Procedure Performed:  1.  Ultrasound-guided access, left femoral artery  2.  Abdominal aortogram  3.  Bilateral lower extremity runoff  4.  Second order catheterization    Indications:  The patient has a nonhealing right fifth toe wound.  She is here for further arterial evaluation as she had abnormal ABIs  Procedure:  The patient was identified in the holding area and taken to room 8.  The patient was then placed supine on the table and prepped and draped in the usual sterile fashion.  A time out was called.  Ultrasound was used to evaluate the left common femoral artery.  It was patent .  A digital ultrasound image was acquired.  A micropuncture needle was used to access the left common femoral artery under ultrasound guidance.  An 018 wire was advanced without resistance and a micropuncture sheath was placed.  The 018 wire was removed and a benson wire was placed.  The micropuncture sheath was exchanged for a 5 french sheath.  An omniflush catheter was advanced over the wire to the level of L-1.  An abdominal angiogram was obtained.  Next, using the omniflush catheter and a benson wire, the aortic bifurcation was crossed and the catheter was placed into theright external iliac artery and right runoff was obtained.  left runoff was performed via retrograde sheath injections.  Findings:   Aortogram:  No significant renal artery stenosis is identified.  The infrarenal abdominal aorta is widely patent.  Bilateral common iliac and external iliac arteries are widely patent.  Right Lower Extremity:  The right common femoral and profunda femoral artery are widely patent.  Mild luminal irregularity within the right superficial femoral artery is observed.  The popliteal artery occludes at the joint space.   All 3 tibial vessels are occluded with reconstitution of the anterior tibial artery at the ankle.  Left Lower Extremity:  The left common femoral artery and profunda femoral artery are widely patent.  The superficial femoral artery is patent however there are 2 areas of high grade, greater than 90% stenosis.  There is runoff through the peroneal artery however this is nearly occluded and the entire vessel looks disease.  Intervention:  None  Impression:  #1  the patient were required distal right tibial bypass graft for limb salvage.  She is not a candidate for percutaneous intervention.  #2  multifocal high-grade stenosis within the left superficial femoral artery  #3  severe left tibial disease     V. Annamarie Major, M.D. [facility] [place] Office: [phone] Pager:  [phone]

## 2014-05-20 NOTE — Progress Notes (Signed)
Site area: left groin a 5 french arterial sheath was removed  Site Prior to Removal:  Level 0  Pressure Applied For 20 MINUTES    Minutes Beginning at 1455  Manual:   Yes.    Patient Status During Pull:  stable  Post Pull Groin Site:  Level 0  Post Pull Instructions Given:  Yes.    Post Pull Pulses Present:  Yes.    Dressing Applied:  Yes.    Comments:  VS remain stable during sheath pull.  Pt denies any discomfort at this time

## 2014-05-20 NOTE — Progress Notes (Signed)
Patient will require right leg tibial bypass graft for limb salvage. I will try to get this done this week by one of my partners. I am going to have cardiology evaluate for clearance. Carotid Doppler studies and lower extremity vein mapping are ordered.  Annamarie Major

## 2014-05-21 ENCOUNTER — Encounter (HOSPITAL_COMMUNITY): Payer: Self-pay | Admitting: Surgery

## 2014-05-21 DIAGNOSIS — Z01810 Encounter for preprocedural cardiovascular examination: Secondary | ICD-10-CM

## 2014-05-21 LAB — CBC
HCT: 30.5 % — ABNORMAL LOW (ref 36.0–46.0)
Hemoglobin: 9.8 g/dL — ABNORMAL LOW (ref 12.0–15.0)
MCH: 25.8 pg — AB (ref 26.0–34.0)
MCHC: 32.1 g/dL (ref 30.0–36.0)
MCV: 80.3 fL (ref 78.0–100.0)
Platelets: 359 10*3/uL (ref 150–400)
RBC: 3.8 MIL/uL — ABNORMAL LOW (ref 3.87–5.11)
RDW: 13.2 % (ref 11.5–15.5)
WBC: 10 10*3/uL (ref 4.0–10.5)

## 2014-05-21 LAB — BASIC METABOLIC PANEL
ANION GAP: 8 (ref 5–15)
BUN: 6 mg/dL (ref 6–23)
CO2: 26 mmol/L (ref 19–32)
Calcium: 8.9 mg/dL (ref 8.4–10.5)
Chloride: 104 mmol/L (ref 96–112)
Creatinine, Ser: 0.51 mg/dL (ref 0.50–1.10)
GFR calc Af Amer: >90 mL/min (ref >90)
GFR calc non Af Amer: >90 mL/min (ref >90)
Glucose, Bld: 185 mg/dL — ABNORMAL HIGH (ref 70–99)
Potassium: 3.7 mmol/L (ref 3.5–5.1)
Sodium: 138 mmol/L (ref 135–145)

## 2014-05-21 LAB — GLUCOSE, CAPILLARY
Glucose-Capillary: 212 mg/dL — ABNORMAL HIGH (ref 70–99)
Glucose-Capillary: 237 mg/dL — ABNORMAL HIGH (ref 70–99)
Glucose-Capillary: 87 mg/dL (ref 70–99)
Glucose-Capillary: 199 mg/dL — ABNORMAL HIGH (ref 70–99)

## 2014-05-21 LAB — CLOSTRIDIUM DIFFICILE BY PCR: Toxigenic C. Difficile by PCR: NEGATIVE

## 2014-05-21 MED ORDER — HYDROCODONE-ACETAMINOPHEN 5-325 MG PO TABS
1.0000 | ORAL_TABLET | Freq: Four times a day (QID) | ORAL | Status: DC | PRN
  Administered 2014-05-21 – 2014-05-25 (×9): 2 via ORAL
  Administered 2014-05-25: 1 via ORAL
  Filled 2014-05-21 (×10): qty 2

## 2014-05-21 NOTE — Progress Notes (Signed)
Rounding Note:  Saw patient due to A1c level (9.6%). Wanted to inquire to see if she takes her medications regularly. Patient reports that she does not know the name of the medications but she takes one once a day and the other one twice a day. Patient reports that she has been on a cloudy insulin at one time taking between 40-50 units BID and is familiar with how to inject. On med rec patient takes Amary 2mg  Daily and Metformin 1,000 mg BID. Patient has a meter but does not have lancets or strips to use it. Patient also reports taking her glucose levels twice a day. Patient wanted to know what to eat. I discussed the plate method of nutrition due to the patient's difficulty with reading english. Noticed patient had two large juice bottles at bedside. Discussed what items should be chosen to drink. Also discussed physical activity with her upper body and eventually lower body to control glucose levels.   MD: At time of discharge please order glucose meter strips and lancets for patient's home glucose meter.  Thanks,  Tama Headings RN, MSN, [facility] Inpatient Diabetes Coordinator Team Pager [phone]

## 2014-05-21 NOTE — Progress Notes (Signed)
PATIENT HAS RESTED INTERMITTENTLY OVERNIGHT, AMBULATING TO BATHROOM WITH USE OF WALKER AND STANDBY ASSISTANCE. GAIT SLOW AND UNSTEADY. RIGHT 5TH TOE EFFECTING PATIENT'S ABILITY TO BEAR WEIGHT ON RLE. PATIENT MEDICATED WITH 2 DOSES OF NORCO (2 TABS EACH DOSE) FOR PAIN.  LEFT GROIN LEVEL 0. LE PULSES VIA DOPPLER.   VISITOR HAS REMAINED AT BEDSIDE. VISITOR ABLE TO TRANSLATE SOME COMMUNICATION FOR PATIENT UNDERSTANDING.  INSTRUCTED TO CALL FOR ASSISTANCE WHEN NEEDED.

## 2014-05-21 NOTE — Progress Notes (Signed)
Family Medicine Teaching Service Daily Progress Note Intern Pager: [phone]  Patient name: Kimberly Lane Medical record number: [id number] Date of birth: Jul 28, 1960 Age: 54 y.o. Gender: female  Primary Care Provider: Marina Goodell, MD Consultants: None Code Status: Full  Pt Overview and Major Events to Date:  3/22- Admitted with infection of RLE; Vanc/zosyn started 3/24- Vascular consulted for PAD and concern for gangrene  ABX/Culture Vanc 3/22 >> 3/24 Zosyn 3/22 >> 3/24 Levaquin 3/24 >> Clinda 3/24 >>  Blood cx 3/22 >> NG Final.   Assessment and Plan: Kimberly Lane is a 54 y.o. female presenting with foot infection 2/2 blunt traumatic injury to right 5th digit. PMH is significant for DM2, HTN, hx of primary TB and treated, HLD.   #Dry Gangrene, right fifth toe: s/p blunt trauma complicated by uncontrolled diabetes and severe PAD. Nonhealing wound. No osteo on MRI.  - Norco Q4 PRN  - Vascular recs: Needs right leg tibial bypass graft, scheduling pending. Vein mapping ordered.  - [facility] to get cards to evaluate for clearance, and would like carotid dopplers.   - Revised Pre-Op Cardiac Risk Index - 0.9% risk of cardiac event, Class II.  - Requires amputation of the right fifth toe, however prior to proceeding, she is going to need revascularization. - Continue clindamycin / levaquin for diabetic foot infection coverage. 2-4 weeks and pending amputation.   #T2DM: A1c 9.6. Doubtful whether she has been filling her medications. Very likely may need insulin if she has been compliant. Suspect insulin requirement to diminish after definitive treatment of infection / nonhealing wound. Would be ideal to get her on a home regimen prior to discharge.  - CBGS -91-212 - hold metformin and [facility]; insulin-naive prior to admission. - Lantus at 15u and continue moderate SSI. Consider adjusting lantus up another 1-2 units.   # Diarrhea: Acutely developed. Has been on antibiotics for 8 days now. High risk  for C. Diff with levaquin, clindamycin, age, hospitalization for C.Diff. However, no abdominal pain. Afebrile.  - C.Diff pcr - Enteric precautions - Monitor stool volume for enteric losses and consider repletion of fluids / electrolytes as needed.   #Peripheral arterial disease: Severe, ABIs ~0.4. Needs revascularization for limb salvage.  - Will require right tibial bypass graft per VVS.  - Will need intervention on the left eventually.  - Will need follow up with VVS at discharge.  - Carotid dopplers per VVS given PAD. 2/6 Systolic ejection murmur to exam. May consider echocardiogram as well.   #HTN: May be on multiple meds due to noncompliance. Will be careful not to precipitate hypotension.  - Continue amlodipine 53m and lisinopril 240m- Normotensive here.   #High risk social situation: unsure of her understanding of her medical issues and compliance with her medications as she is a refugee and speaks [place]. [place] Has [place] card and access to MAP.  - Will ensure ability to maintain compliance prior to d/c.   #Normochromic anemia: Mild, prior to admission baseline 12-13. Stable.  - has been stable around 10-11 here. May be a result of lower extremity wound.  - Trend CBC's Hgb 9.8 today from 10.2. Continue to monitor.   #Hypokalemia: 3.7 today - replete with K-dur as needed.  - monitor daily.  FEN/GI: NPO for procedure > Heart health/Carb modified diet/ Saline lock  Prophylaxis: heparin SubQ  Disposition:  Pending scheduling of bypass grafting and plan for surgical intervention.   Subjective:  Pt. Says that she is doing well this morning and she is very thankful for the care that she has received from the medical team. She says that her foot pain is well controlled, but that it is still tender to the touch from the dorsum of her foot to her toes. She  does say that  Yesterday she developed diarrhea that is watery and profuse. She cannot count the number of times that she has gone to the restroom since yesterday. She denies abdominal pain. She denies fever or chills. She denies nausea /vomiting. She has no other complaints.   This conversation was accompanied by the use of a [name] interpreter.     Objective: Temp:  [98.6 F (37 C)-98.8 F (37.1 C)] 98.8 F (37.1 C) (03/30 0458) Pulse Rate:  [72-90] 78 (03/30 0458) Resp:  [16-22] 18 (03/30 0458) BP: (91-158)/(42-98) 145/71 mmHg (03/30 0458) SpO2:  [95 %-100 %] 100 % (03/30 0458) Physical Exam: General: NAD, awake and alert Cardiovascular: RRR, systolic 2/6 ejection murmur with radiation to b/l carotids. Difficult to discern true carotid bruit vs. Transmitted systolic ejection murmur. 2+ radial pulses, peripheral pulses unable to palpate.  Respiratory: CTAB, appropriate rate, unlabored.  Abdomen: S, NT, No guarding, no rebound or peritoneal signs, ND, +BS, Patient does grimace during exam however.  Extremities: Tender ~1cm laceration-type wound on infero-lateral aspect of right 5th toe without drainage. Decreased sensation on plantar surfaces of BL feet. Poor DP pulses, and 1+ palpable PT pulses BL. Cool to the touch. Swelling improving on the right.    Laboratory:  Recent Labs Lab 05/19/14 0901 05/20/14 0508 05/21/14 0351  WBC 9.4 9.1 10.0  HGB 10.0* 10.2* 9.8*  HCT 30.9* 31.6* 30.5*  PLT 363 372 359    Recent Labs Lab 05/16/14 0747 05/19/14 0901 05/20/14 0508  NA 137 135 136  K 3.4* 3.1* 3.9  CL 101 100 104  CO2 '28 26 27  ' BUN 5* 8 8  CREATININE 0.52 0.61 0.55  CALCIUM 9.1 8.9 9.0  GLUCOSE 242* 260* 227*   Blood Cx - NGTD  ESR - 103  CRP - 4.7   Aquilla Hacker, MD 05/21/2014, 6:23 AM PGY-1, [place] [facility] Intern pager: [phone], text pages welcome

## 2014-05-21 NOTE — Consult Note (Signed)
Primary cardiologist: New  HPI: 54 year old female for preoperative evaluation prior to peripheral vascular surgery. Note patient is from the [place] and is not speaking much. History is obtained with the assistance of an interpreter. Patient has been admitted with a foot infection. She has been found to have peripheral vascular disease and will require right leg tibial bypass. Cardiology asked to evaluate. She does have dyspnea on exertion but no orthopnea, PND or pedal edema. There is no history of chest pain or syncope.  Medications Prior to Admission  Medication Sig Dispense Refill  . acetaminophen (TYLENOL) 500 MG tablet Take 1 tablet (500 mg total) by mouth every 6 (six) hours as needed. (Patient taking differently: Take 500 mg by mouth every 6 (six) hours as needed for mild pain. ) 30 tablet 0  . amLODipine (NORVASC) 5 MG tablet Take 1 tablet (5 mg total) by mouth daily. 90 tablet 3  . diclofenac sodium (VOLTAREN) 1 % GEL Apply 4 g topically 4 (four) times daily. For shoulder pain 3 Tube 0  . glimepiride (AMARYL) 2 MG tablet Take 2 mg by mouth daily with breakfast.    . HYDROcodone-acetaminophen (NORCO/VICODIN) 5-325 MG per tablet Take 1-2 tablets by mouth every 4 (four) hours as needed. 12 tablet 0  . lisinopril (PRINIVIL,ZESTRIL) 40 MG tablet Take 40 mg by mouth daily.    . metFORMIN (GLUCOPHAGE) 1000 MG tablet Take 1 tablet (1,000 mg total) by mouth 2 (two) times daily with a meal. 60 tablet 11  . simvastatin (ZOCOR) 20 MG tablet Take 40 mg by mouth daily.    Marland Kitchen sulfamethoxazole-trimethoprim (BACTRIM DS,SEPTRA DS) 800-160 MG per tablet Take 1 tablet by mouth 2 (two) times daily.    Marland Kitchen aspirin 81 MG tablet Take 1 tablet (81 mg total) by mouth daily. (Patient not taking: Reported on 05/13/2014) 100 tablet 0    Allergies  Allergen Reactions  . Ace Inhibitors     cough  . Bactrim [Sulfamethoxazole-Trimethoprim] Nausea And Vomiting    Pt and family verified that pt takes this medication       Past Medical History  Diagnosis Date  . Diabetes mellitus   . Hypertension   . Cellulitis and abscess of foot 05/13/2014    rt foot  . Positive TB test 2013    Past Surgical History  Procedure Laterality Date  . Abdominal aortagram N/A 05/20/2014    Procedure: ABDOMINAL Maxcine Ham;  Surgeon: Serafina Mitchell, MD;  Location: [facility] CATH LAB;  Service: Cardiovascular;  Laterality: N/A;    History   Social History  . Marital Status: Married    Spouse Name: N/A  . Number of Children: N/A  . Years of Education: N/A   Occupational History  . Not on file.   Social History Main Topics  . Smoking status: Never Smoker   . Smokeless tobacco: Never Used  . Alcohol Use: No  . Drug Use: No  . Sexual Activity: Yes    Birth Control/ Protection: Post-menopausal   Other Topics Concern  . Not on file   Social History Narrative    Family History  Problem Relation Age of Onset  . Heart disease      No family history    ROS:  Pain in right foot but no fevers or chills, productive cough, hemoptysis, dysphasia, odynophagia, melena, hematochezia, dysuria, hematuria, rash, seizure activity, orthopnea, PND, pedal edema, claudication. Remaining systems are negative.  Physical Exam:   Blood pressure 144/61, pulse 80, temperature 98.7 F (  37.1 C), temperature source Oral, resp. rate 19, height 5' (1.524 m), weight 129 lb 13.6 oz (58.9 kg), SpO2 98 %.  General:  Well developed/well nourished in NAD Skin warm/dry Patient not depressed No peripheral clubbing Back-normal HEENT-normal/normal eyelids Neck supple/normal carotid upstroke bilaterally; no bruits; no JVD; no thyromegaly chest - CTA/ normal expansion CV - RRR/normal S1 and S2; no murmurs, rubs or gallops;  PMI nondisplaced Abdomen -NT/ND, no HSM, no mass, + bowel sounds, no bruit 2+ femoral pulses, no bruits Ext-no edema, chords; diminished distal pulses.  Neuro-grossly nonfocal  Results for orders placed or performed  during the hospital encounter of 05/13/14 (from the past 48 hour(s))  Glucose, capillary     Status: Abnormal   Collection Time: 05/19/14  5:11 PM  Result Value Ref Range   Glucose-Capillary 103 (H) 70 - 99 mg/dL  Glucose, capillary     Status: Abnormal   Collection Time: 05/19/14  9:44 PM  Result Value Ref Range   Glucose-Capillary 252 (H) 70 - 99 mg/dL   Comment 1 Notify RN    Comment 2 Document in Chart   Basic metabolic panel     Status: Abnormal   Collection Time: 05/20/14  5:08 AM  Result Value Ref Range   Sodium 136 135 - 145 mmol/L   Potassium 3.9 3.5 - 5.1 mmol/L    Comment: DELTA CHECK NOTED   Chloride 104 96 - 112 mmol/L   CO2 27 19 - 32 mmol/L   Glucose, Bld 227 (H) 70 - 99 mg/dL   BUN 8 6 - 23 mg/dL   Creatinine, Ser 0.55 0.50 - 1.10 mg/dL   Calcium 9.0 8.4 - 10.5 mg/dL   GFR calc non Af Amer >90 >90 mL/min   GFR calc Af Amer >90 >90 mL/min    Comment: (NOTE) The eGFR has been calculated using the CKD EPI equation. This calculation has not been validated in all clinical situations. eGFR's persistently <90 mL/min signify possible Chronic Kidney Disease.    Anion gap 5 5 - 15  CBC     Status: Abnormal   Collection Time: 05/20/14  5:08 AM  Result Value Ref Range   WBC 9.1 4.0 - 10.5 K/uL   RBC 3.93 3.87 - 5.11 MIL/uL   Hemoglobin 10.2 (L) 12.0 - 15.0 g/dL   HCT 31.6 (L) 36.0 - 46.0 %   MCV 80.4 78.0 - 100.0 fL   MCH 26.0 26.0 - 34.0 pg   MCHC 32.3 30.0 - 36.0 g/dL   RDW 13.1 11.5 - 15.5 %   Platelets 372 150 - 400 K/uL  Glucose, capillary     Status: Abnormal   Collection Time: 05/20/14  7:58 AM  Result Value Ref Range   Glucose-Capillary 190 (H) 70 - 99 mg/dL  Glucose, capillary     Status: Abnormal   Collection Time: 05/20/14 12:00 PM  Result Value Ref Range   Glucose-Capillary 114 (H) 70 - 99 mg/dL  Glucose, capillary     Status: None   Collection Time: 05/20/14  2:53 PM  Result Value Ref Range   Glucose-Capillary 99 70 - 99 mg/dL  Glucose,  capillary     Status: None   Collection Time: 05/20/14  4:27 PM  Result Value Ref Range   Glucose-Capillary 91 70 - 99 mg/dL  Glucose, capillary     Status: Abnormal   Collection Time: 05/20/14  9:02 PM  Result Value Ref Range   Glucose-Capillary 254 (H) 70 - 99 mg/dL  Basic metabolic panel     Status: Abnormal   Collection Time: 05/21/14  3:51 AM  Result Value Ref Range   Sodium 138 135 - 145 mmol/L   Potassium 3.7 3.5 - 5.1 mmol/L   Chloride 104 96 - 112 mmol/L   CO2 26 19 - 32 mmol/L   Glucose, Bld 185 (H) 70 - 99 mg/dL   BUN 6 6 - 23 mg/dL   Creatinine, Ser 0.51 0.50 - 1.10 mg/dL   Calcium 8.9 8.4 - 10.5 mg/dL   GFR calc non Af Amer >90 >90 mL/min   GFR calc Af Amer >90 >90 mL/min    Comment: (NOTE) The eGFR has been calculated using the CKD EPI equation. This calculation has not been validated in all clinical situations. eGFR's persistently <90 mL/min signify possible Chronic Kidney Disease.    Anion gap 8 5 - 15  CBC     Status: Abnormal   Collection Time: 05/21/14  3:51 AM  Result Value Ref Range   WBC 10.0 4.0 - 10.5 K/uL   RBC 3.80 (L) 3.87 - 5.11 MIL/uL   Hemoglobin 9.8 (L) 12.0 - 15.0 g/dL   HCT 30.5 (L) 36.0 - 46.0 %   MCV 80.3 78.0 - 100.0 fL   MCH 25.8 (L) 26.0 - 34.0 pg   MCHC 32.1 30.0 - 36.0 g/dL   RDW 13.2 11.5 - 15.5 %   Platelets 359 150 - 400 K/uL  Glucose, capillary     Status: Abnormal   Collection Time: 05/21/14  6:13 AM  Result Value Ref Range   Glucose-Capillary 212 (H) 70 - 99 mg/dL   Comment 1 Notify RN   Clostridium Difficile by PCR     Status: None   Collection Time: 05/21/14 10:39 AM  Result Value Ref Range   C difficile by pcr NEGATIVE NEGATIVE  Glucose, capillary     Status: Abnormal   Collection Time: 05/21/14 11:22 AM  Result Value Ref Range   Glucose-Capillary 237 (H) 70 - 99 mg/dL    No results found.  Assessment/Plan 1 preoperative evaluation prior to peripheral vascular disease-patient has limited mobility at present.  She does have dyspnea on exertion. She has diabetes mellitus and hypertension and hyperlipidemia. She has not received good medical care in the past. She will need risk stratification preoperatively and I will arrange a nuclear study tomorrow morning. 2 hypertension-continue present medications. 3 peripheral vascular disease-continue aspirin and statin. Further management per vascular surgery. 4 hyperlipidemia-continue statin. 5 diabetes mellitus-management per primary care.  Kirk Ruths MD 05/21/2014, 1:42 PM

## 2014-05-21 NOTE — Progress Notes (Signed)
Utilization review completed.  

## 2014-05-22 ENCOUNTER — Inpatient Hospital Stay (HOSPITAL_COMMUNITY): Payer: Medicaid Other

## 2014-05-22 DIAGNOSIS — L03031 Cellulitis of right toe: Secondary | ICD-10-CM

## 2014-05-22 DIAGNOSIS — R9431 Abnormal electrocardiogram [ECG] [EKG]: Secondary | ICD-10-CM | POA: Insufficient documentation

## 2014-05-22 LAB — CBC
HCT: 31 % — ABNORMAL LOW (ref 36.0–46.0)
Hemoglobin: 10 g/dL — ABNORMAL LOW (ref 12.0–15.0)
MCH: 26 pg (ref 26.0–34.0)
MCHC: 32.3 g/dL (ref 30.0–36.0)
MCV: 80.7 fL (ref 78.0–100.0)
Platelets: 384 10*3/uL (ref 150–400)
RBC: 3.84 MIL/uL — ABNORMAL LOW (ref 3.87–5.11)
RDW: 13.3 % (ref 11.5–15.5)
WBC: 10.6 10*3/uL — ABNORMAL HIGH (ref 4.0–10.5)

## 2014-05-22 LAB — BASIC METABOLIC PANEL
ANION GAP: 7 (ref 5–15)
CALCIUM: 9.3 mg/dL (ref 8.4–10.5)
CO2: 29 mmol/L (ref 19–32)
Chloride: 102 mmol/L (ref 96–112)
Creatinine, Ser: 0.57 mg/dL (ref 0.50–1.10)
GFR calc Af Amer: >90 mL/min (ref >90)
Glucose, Bld: 149 mg/dL — ABNORMAL HIGH (ref 70–99)
Potassium: 3.6 mmol/L (ref 3.5–5.1)
Sodium: 138 mmol/L (ref 135–145)

## 2014-05-22 LAB — GLUCOSE, CAPILLARY
GLUCOSE-CAPILLARY: 119 mg/dL — AB (ref 70–99)
GLUCOSE-CAPILLARY: 155 mg/dL — AB (ref 70–99)
Glucose-Capillary: 147 mg/dL — ABNORMAL HIGH (ref 70–99)
Glucose-Capillary: 274 mg/dL — ABNORMAL HIGH (ref 70–99)

## 2014-05-22 MED ORDER — TECHNETIUM TC 99M SESTAMIBI GENERIC - CARDIOLITE
10.0000 | Freq: Once | INTRAVENOUS | Status: AC | PRN
  Administered 2014-05-22: 10 via INTRAVENOUS

## 2014-05-22 MED ORDER — BACID PO TABS
2.0000 | ORAL_TABLET | Freq: Three times a day (TID) | ORAL | Status: DC
  Administered 2014-05-22 – 2014-05-25 (×9): 2 via ORAL
  Filled 2014-05-22 (×16): qty 2

## 2014-05-22 MED ORDER — REGADENOSON 0.4 MG/5ML IV SOLN
INTRAVENOUS | Status: AC
  Administered 2014-05-22: 0.4 mg via INTRAVENOUS
  Filled 2014-05-22: qty 5

## 2014-05-22 MED ORDER — DEXTROSE 5 % IV SOLN
1.5000 g | INTRAVENOUS | Status: AC
  Administered 2014-05-23: 1.5 g via INTRAVENOUS
  Filled 2014-05-22: qty 1.5

## 2014-05-22 MED ORDER — TECHNETIUM TC 99M SESTAMIBI GENERIC - CARDIOLITE
30.0000 | Freq: Once | INTRAVENOUS | Status: AC | PRN
  Administered 2014-05-22: 30 via INTRAVENOUS

## 2014-05-22 MED ORDER — REGADENOSON 0.4 MG/5ML IV SOLN
0.4000 mg | Freq: Once | INTRAVENOUS | Status: AC
  Administered 2014-05-22: 0.4 mg via INTRAVENOUS
  Filled 2014-05-22: qty 5

## 2014-05-22 NOTE — Progress Notes (Addendum)
Pt blood consent form signed for procedure in the am through use of family member. Pt reports through family member that she is unable to understand the [place] spoken through the phone interpreter because of the accent. Pt is from [place].   Will continue to monitor.   Kimberly Lane

## 2014-05-22 NOTE — Progress Notes (Signed)
Patient Name: Kimberly Lane Date of Encounter: 05/22/2014  Primary cardiologist: New   Active Problems:   Cellulitis   Type 2 diabetes mellitus with complication   Cellulitis and abscess of toe   HLD (hyperlipidemia)   Peripheral arterial disease   Cellulitis of toe of right foot   Preop cardiovascular exam    SUBJECTIVE  Intermittent chest discomfort near R breast. No SOB. [name] women, interviewed with interpretor.   CURRENT MEDS . amLODipine  10 mg Oral Daily  . aspirin EC  81 mg Oral Daily  . atorvastatin  40 mg Oral Daily  . heparin  5,000 Units Subcutaneous 3 times per day  . insulin aspart  0-15 Units Subcutaneous TID WC  . insulin glargine  15 Units Subcutaneous Daily  . lactobacillus acidophilus  2 tablet Oral TID  . levofloxacin  750 mg Oral Daily  . lisinopril  20 mg Oral Daily    OBJECTIVE  Filed Vitals:   05/21/14 1303 05/21/14 2019 05/22/14 0513 05/22/14 1107  BP: 144/61 140/63 170/67 148/61  Pulse: 80 78 71 72  Temp: 98.7 F (37.1 C) 98.6 F (37 C) 97.8 F (36.6 C)   TempSrc: Oral Oral Oral   Resp: 19 18 18    Height:      Weight:      SpO2: 98% 97% 99%     Intake/Output Summary (Last 24 hours) at 05/22/14 1115 Last data filed at 05/21/14 1305  Gross per 24 hour  Intake    100 ml  Output      0 ml  Net    100 ml   Filed Weights   05/13/14 1610  Weight: 129 lb 13.6 oz (58.9 kg)    PHYSICAL EXAM  General: Pleasant, NAD. Neuro: Alert and oriented X 3. Moves all extremities spontaneously. Psych: Normal affect. HEENT:  Normal  Neck: Supple without bruits or JVD. Lungs:  Resp regular and unlabored, CTA. Heart: RRR no s3, s4, or murmurs. Abdomen: Soft, non-tender, non-distended, BS + x 4.  Extremities: No clubbing, cyanosis. No LE edema. R small toe necrotic.  Accessory Clinical Findings  CBC  Recent Labs  05/21/14 0351 05/22/14 0410  WBC 10.0 10.6*  HGB 9.8* 10.0*  HCT 30.5* 31.0*  MCV 80.3 80.7  PLT 359 0000000   Basic  Metabolic Panel  Recent Labs  05/21/14 0351 05/22/14 0410  NA 138 138  K 3.7 3.6  CL 104 102  CO2 26 29  GLUCOSE 185* 149*  BUN 6 <5*  CREATININE 0.51 0.57  CALCIUM 8.9 9.3    ECG  NSR with diffuse TWI   Radiology/Studies  Dg Chest 2 View  05/06/2014   CLINICAL DATA:  Cough and fever for several days  EXAM: CHEST  2 VIEW  COMPARISON:  May 04, 2010  FINDINGS: There is no edema or consolidation. Heart is upper normal in size with pulmonary vascularity within normal limits. No adenopathy. There is degenerative change in the lower thoracic spine.  IMPRESSION: No edema or consolidation.   Electronically Signed   By: Lowella Grip III M.D.   On: 05/06/2014 16:37   Mr Toes Right Wo/w Cm  05/14/2014   CLINICAL DATA:  Osteomyelitis, history of diabetes, cellulitis of the right foot  EXAM: MRI OF THE RIGHT TOES WITHOUT AND WITH CONTRAST  TECHNIQUE: Multiplanar, multisequence MR imaging was performed both before and after administration of intravenous contrast.  CONTRAST:  50mL MULTIHANCE GADOBENATE DIMEGLUMINE 529 MG/ML IV SOLN  COMPARISON:  None.  FINDINGS: Patient motion degrades image quality limiting evaluation.  There is a soft tissue laceration involving the fifth toe. The fifth middle and distal phalanx are difficult to assess secondary to motion. There is mild edema within the fifth proximal phalanx without enhancement. There is no definite cortical destruction.  There is moderate osteoarthritis of the fifth MTP joint. There is edema in the medial hallux sesamoid as can be seen with sesamoiditis.  There is no other marrow signal abnormality. There is no fracture or dislocation. There is no fluid collection or hematoma. There is soft tissue swelling along the dorsal lateral aspect of the right forefoot.  IMPRESSION: 1. Soft tissue laceration the involving the fifth toe. The middle and distal fifth phalanx are nondiagnostic to motion. There mild edema within the fifth proximal phalanx  without enhancement or cortical destruction likely rib reflecting reactive edema. 2. Mild medial hallux sesamoiditis.   Electronically Signed   By: Kathreen Devoid   On: 05/14/2014 13:32   Dg Abd Acute W/chest  05/09/2014   CLINICAL DATA:  Hypertension.  Diabetes.  EXAM: ACUTE ABDOMEN SERIES (ABDOMEN 2 VIEW & CHEST 1 VIEW)  COMPARISON:  05/06/2014, 01/08/2010  FINDINGS: There is no evidence of dilated bowel loops or free intraperitoneal air. No radiopaque calculi or other significant radiographic abnormality is seen. There is moderate unchanged cardiomegaly. Hilar and mediastinal contours are within normal limits. Both lungs are clear.  IMPRESSION: Negative abdominal radiographs.  No acute cardiopulmonary disease.   Electronically Signed   By: Andreas Newport M.D.   On: 05/09/2014 02:18   Dg Foot 2 Views Right  05/13/2014   CLINICAL DATA:  Cellulitis of fifth toe.  EXAM: RIGHT FOOT - 2 VIEW  COMPARISON:  05/09/2014  FINDINGS: Mild diffuse osteopenia. There is no acute fracture or subluxation identified. There is soft tissue swelling and ulceration overlying the fifth distal phalanx. No underlying bone erosion identified. Osteoarthritis involving the first MTP joint identified.  IMPRESSION: 1. No evidence for osteomyelitis. 2. Soft tissue swelling and ulceration overlies the fifth distal phalanx.   Electronically Signed   By: Kerby Moors M.D.   On: 05/13/2014 17:57   Dg Foot Complete Right  05/09/2014   CLINICAL DATA:  Total pain, medial aspect of the fifth digit  EXAM: RIGHT FOOT COMPLETE - 3+ VIEW  COMPARISON:  None.  FINDINGS: Negative for fracture, dislocation or radiopaque foreign body. There is no bone lesion or bony destruction  IMPRESSION: Negative.   Electronically Signed   By: Andreas Newport M.D.   On: 05/09/2014 02:14    ASSESSMENT AND PLAN  1. Preoperative evaluation prior to LE bypass surgery  - pending lexiscan myoview for risk stratification  2. PAD requiring R leg tibial  bypass  3. HTN: continue amlodipine, lisinopril 4. HLD: on lipitor 5. DM  Signed, Almyra Deforest PA-C Pager: [phone]   History and all data above reviewed.  Patient examined.  I agree with the findings as above. She is in no distress and denies any pain at this point The patient exam reveals COR:RRR  ,  Lungs: Clear  ,  Abd: Positive bowel sounds, no rebound no guarding, Ext No edema  .  All available labs, radiology testing, previous records reviewed. Agree with documented assessment and plan. Preop:  Lexiscan Myoview completed but results pending.  We will follow up later today.    Jeneen Rinks Jamie-Lee Galdamez  12:42 PM  05/22/2014

## 2014-05-22 NOTE — Progress Notes (Signed)
Pt vomited x1. Pt states through family interpreter she feels better after vomiting.    Will continue to monitor.   Earlie Lou

## 2014-05-22 NOTE — Progress Notes (Signed)
Family Medicine Teaching Service Daily Progress Note Intern Pager: [phone]  Patient name: Kimberly Lane Medical record number: [id number] Date of birth: 02/28/1960 Age: 54 y.o. Gender: female  Primary Care Provider: Marina Goodell, MD Consultants: None Code Status: Full  Pt Overview and Major Events to Date:  3/22- Admitted with infection of RLE; Vanc/zosyn started 3/24- Vascular consulted for PAD and concern for gangrene  ABX/Culture Vanc 3/22 >> 3/24 Zosyn 3/22 >> 3/24 Levaquin 3/24 >> Clinda 3/24 >> 3/31  Blood cx 3/22 >> NG Final.   Assessment and Plan: Kimberly Lane is a 54 y.o. female presenting with foot infection / nonhealing wound 2/2 blunt traumatic injury to right 5th digit. PMH is significant for DM2, HTN, hx of primary TB and treated, HLD.   #Dry Gangrene, right fifth toe: s/p blunt trauma complicated by uncontrolled diabetes and severe PAD. Nonhealing wound. No osteo on MRI.  - Norco Q4 PRN  - Vascular recs: Needs right leg tibial bypass graft - Vein mapping ordered. Pending scheduling of Bypass Graft - Cards on board for pre-op evaluation > NM scan today for risk stratification.  - Carotid Dopplers pending  - Requires amputation of the right fifth toe, however prior to proceeding, she is going to need revascularization. - Continue levaquin for diabetic foot infection coverage. 2-4 weeks total / pending amputation.  - Discontinue clindamycin given diarrhea and concern for risk of C.Diff.   #T2DM: A1c 9.6. On Amaryl and Metformin at home. Per discussion with Care Management and Diabetes Coordinator she endorses compliance. Has been on insulin in the past. Very likely may need insulin at discharge. Suspect insulin requirement to diminish after definitive treatment of infection / nonhealing wound. Would be ideal to get her on a home regimen prior to discharge.  - CBGS [id number] yesterday. - Consistently dropping <100 in the afternoon.   - holding home po meds.  - Lantus at  15u and continue moderate SSI.   # Diarrhea: Acutely developed. Has been on antibiotics for 8 days now. High risk for C. Diff with levaquin, clindamycin, age, hospitalization for C.Diff. However, no abdominal pain. Afebrile.  - C.Diff pcr is negative.  - Enteric precautions for now.  - Electrolytes stable.  - Probiotics - Discontinued clindamycin.   #Peripheral arterial disease: Severe, ABIs ~0.4. Needs revascularization for limb salvage.  - Will require right tibial bypass graft per [facility].  - Will need intervention on the left eventually.  - Will need follow up with [facility] at discharge.  - Carotid dopplers per [facility] given PAD. NM study for central disease.  - ASA, Lipitor.   #HTN: May be on multiple meds due to noncompliance. Will be careful not to precipitate hypotension.  - Continue amlodipine 30m and lisinopril 261m- Normotensive here.   #High risk social situation: Patient [place] a [place] and speaks a dialect of Arabic, she has little social support though she has apparently been able to obtain meds through the MAP program. She also reports compliance. [place] - [facility] needs at [facility].  - CM for meds. > pt. Has [employer] card and access to MAP.  - Will ensure ability to maintain compliance prior to d/c.   #Normochromic anemia: Mild, prior to admission baseline 12-13. Stable.  - has been stable around 10-11 here.  - Continue to monitor CBC's   #Hypokalemia: 3.6 today - replete with K-dur as needed.  -  monitor daily.  FEN/GI: NPO for NM study > Heart health/Carb modified diet/ Saline lock  Prophylaxis: heparin SubQ   Disposition:  NM study today. Pending scheduling of bypass grafting.   Subjective:   Patient says that she feels her foot is more warm this morning. She says that her diarrhea has improved. She continues to have some pain that is more throbbing in nature. She  otherwise, is hungry and would like something to eat. She has no other complaints at this time.   Objective: Temp:  [97.8 F (36.6 C)-98.7 F (37.1 C)] 97.8 F (36.6 C) (03/31 0513) Pulse Rate:  [71-80] 71 (03/31 0513) Resp:  [18-19] 18 (03/31 0513) BP: (140-170)/(61-67) 170/67 mmHg (03/31 0513) SpO2:  [97 %-99 %] 99 % (03/31 0513) Physical Exam: General: NAD, AAOx3 Cardiovascular: RRR, systolic 2/6 ejection murmur with radiation to b/l carotids.  Respiratory: CTAB, appropriate rate, unlabored.  Abdomen: S, NT, No guarding, no rebound or peritoneal signs, ND, +BS Extremities: Tender ~1cm laceration-type wound on infero-lateral aspect of right 5th toe without drainage. Decreased sensation on plantar surfaces of BL feet. Poor DP pulses, and 1+ palpable PT pulses BL. Somewhat warm to the touch today, but this is evident bilaterally as well. Swelling improving on the right.    Laboratory:  Recent Labs Lab 05/20/14 0508 05/21/14 0351 05/22/14 0410  WBC 9.1 10.0 10.6*  HGB 10.2* 9.8* 10.0*  HCT 31.6* 30.5* 31.0*  PLT 372 359 384    Recent Labs Lab 05/20/14 0508 05/21/14 0351 05/22/14 0410  NA 136 138 138  K 3.9 3.7 3.6  CL 104 104 102  CO2 '27 26 29  ' BUN 8 6 <5*  CREATININE 0.55 0.51 0.57  CALCIUM 9.0 8.9 9.3  GLUCOSE 227* 185* 149*   Blood Cx - NGTD  ESR - 103  CRP - 4.7   Aquilla Hacker, MD 05/22/2014, 8:27 AM PGY-1, [place] Intern pager: [phone], text pages welcome

## 2014-05-22 NOTE — Discharge Summary (Signed)
[facility] Discharge Summary  Patient name: Kimberly Lane Medical record number: [id number] Date of birth: 08-18-1960 Age: 54 y.o. Gender: female Date of Admission: 05/13/2014  Date of Discharge: 05/26/2014 Admitting Physician: Kinnie Feil, MD  Primary Care Provider: Marina Goodell, MD Consultants: Vascular Surgery, Cardiology  Indication for Hospitalization: Dry Gangrene  Discharge Diagnoses/Problem List:  PAD, T2DM, HTN, HLD, History of primary TB  Disposition: CIR  Discharge Condition: Improved  Discharge Exam:  Blood pressure 156/64, pulse 83, temperature 99.2 F (37.3 C), temperature source Oral, resp. rate 18, height 5' (1.524 m), weight 129 lb 13.6 oz (58.9 kg), SpO2 97 %. General: Female sitting up in bed, NAD Cardiovascular: RRR, systolic 2/6 ejection murmur at RUSB Respiratory: CTAB, appropriate rate, unlabored.  Abdomen: S, NT, No guarding, no rebound or peritoneal signs, ND, +BS Extremities: s/p right BKA. Dressing in place, dry and intact without notable tracking of erythema Neuro: alert and interactive. No focal deficits.    Brief Hospital Course:  Kimberly Lane is a 54 y.o. female who presented with possible foot infection and nonhealing wound 2/2 blunt traumatic injury to right 5th digit. PMH is significant for T2DM, HTN, hx of primary TB and treated, HLD. Her hospital course by problem is outlined below:  PAD, Drygangrene of right fifth toe, s/p right BKA 4/1. Patient was admitted from clinic due to evidence of poorly healing infection of her right 5th digit and concern for wet gangrene or possible osteomyelitis. She was started on broad spectrum antibiotics. X-Ray and MRI of her right 5th digit did not reveal osteomyelitis or advancement of infection through the fascial planes. On exam, she was noted to have poorly palpable pulses bilaterally. ABI's were obtained which revealed very poor blood flow distally bilaterally with indices of 0.4  in the dorsalis pedis on the right and left as well as 0.7 in the posterior tibial artery on the right and left. Vascular surgery was consulted who intially felt that she would need a toe amputation and vascular intervention to facilitate proper healing. Angiogram of her lower extremities was performed and she was found to need surgical bypass grafting of her tibial artery on the right for revascularization prior to amputation. Given her extensive disease, vascular surgery did not think that proceeding with revascularization would be beneficial. After extensive discussion, patient and family agreed to proceeding with right BKA. The patient tolerated the procedure well without significant complications. Her antibiotics were stopped after her procedure. She was able to have her pain adequately controlled with oral oxycodone. She did have post operative urinary retention that required intermittent in-and-out catheterization, likely secondary to anesthesia and narcotic side effects. Otherwise she had no post operative complications and will be discharged in stable condition to CIR. We continued the patient on her aspirin 81mg  and atorvastatin.   T2DM. We held the patient's amaryl and metformin while here. Her A1c was 9.6. We started the patient on a basal-bolus insulin regimen. At the time of discharge, her regimen included Lantus 20U nightly and moderate SSI.   HTN. Patient was managed on amlodipine 10mg  and lisinopril 20mg  daily while here.  Thyroid Nodule. Incidental right thyroid nodule (2.3 x 1.4 x 2.2 cm) noted on carotid dopplers. Further work up was deferred to be done as an outpatient.   Issues for Follow Up:  1. Patient discharged to CIR only on basal-bolus insulin. Once she becomes more stable s/p infection and BKA, her insulin requirements will likely change. May also consider re-adding metformin or  other oral agents at follow up.  2. Patient has difficult social situation - she is a [name]  refugee and speaks a dialect of Arabic. Her husband is in [place] and she has no other family in [place]. Recommend social work consult.  3. Recommend TSH and possibly thyroid ultrasound for further work up of incidental thyroid nodule on outpatient follow up.   Significant Procedures: Right BKA on 4/1  Significant Labs and Imaging:   Recent Labs Lab 05/24/14 0455 05/25/14 0503 05/26/14 0438  WBC 15.0* 12.3* 12.5*  HGB 9.2* 8.7* 8.7*  HCT 28.3* 27.4* 27.2*  PLT 421* 412* 414*    Recent Labs Lab 05/22/14 0410 05/23/14 0400 05/24/14 0455 05/25/14 0503 05/26/14 0438  NA 138 139 134* 135 136  K 3.6 3.6 3.3* 3.3* 3.5  CL 102 100 98 97 97  CO2 29 29 31 29 28   GLUCOSE 149* 141* 283* 218* 178*  BUN <5* 6 <5* <5* <5*  CREATININE 0.57 0.55 0.52 0.50 0.53  CALCIUM 9.3 9.2 8.7 8.6 8.6    Recent Labs Lab 05/25/14 1136 05/25/14 1618 05/25/14 2152 05/26/14 0559 05/26/14 1147  GLUCAP 182* 160* 174* 199* 273*   Mr Toes Right Wo/w Cm 05/14/2014    FINDINGS: Patient motion degrades image quality limiting evaluation.  There is a soft tissue laceration involving the fifth toe. The fifth middle and distal phalanx are difficult to assess secondary to motion. There is mild edema within the fifth proximal phalanx without enhancement. There is no definite cortical destruction.  There is moderate osteoarthritis of the fifth MTP joint. There is edema in the medial hallux sesamoid as can be seen with sesamoiditis.  There is no other marrow signal abnormality. There is no fracture or dislocation. There is no fluid collection or hematoma. There is soft tissue swelling along the dorsal lateral aspect of the right forefoot.  IMPRESSION: 1. Soft tissue laceration the involving the fifth toe. The middle and distal fifth phalanx are nondiagnostic to motion. There mild edema within the fifth proximal phalanx without enhancement or cortical destruction likely rib reflecting reactive edema. 2. Mild  medial hallux sesamoiditis.     Dg Foot 2 Views Right 05/13/2014   FINDINGS: Mild diffuse osteopenia. There is no acute fracture or subluxation identified. There is soft tissue swelling and ulceration overlying the fifth distal phalanx. No underlying bone erosion identified. Osteoarthritis involving the first MTP joint identified.  IMPRESSION: 1. No evidence for osteomyelitis. 2. Soft tissue swelling and ulceration overlies the fifth distal phalanx.     Stress Test 3/31 FINDINGS: Perfusion: No decreased activity in the left ventricle on stress imaging to suggest reversible ischemia or infarction. Wall Motion: Normal left ventricular wall motion. No left ventricular dilation. Left Ventricular Ejection Fraction: 67 % End diastolic volume 37 ml End systolic volume 12 ml  IMPRESSION: 1. No reversible ischemia or infarction. 2. Normal left ventricular wall motion. 3. Left ventricular ejection fraction is 67%. 4. Low-risk stress test findings*.   Results/Tests Pending at Time of Discharge: None  Discharge Medications:    Medication List    STOP taking these medications        glimepiride 2 MG tablet  Commonly known as:  AMARYL     metFORMIN 1000 MG tablet  Commonly known as:  GLUCOPHAGE     simvastatin 20 MG tablet  Commonly known as:  ZOCOR     sulfamethoxazole-trimethoprim 800-160 MG per tablet  Commonly known as:  BACTRIM DS,SEPTRA DS  TAKE these medications        acetaminophen 500 MG tablet  Commonly known as:  TYLENOL  Take 1 tablet (500 mg total) by mouth every 6 (six) hours as needed.     amLODipine 10 MG tablet  Commonly known as:  NORVASC  Take 1 tablet (10 mg total) by mouth daily.     aspirin 81 MG tablet  Take 1 tablet (81 mg total) by mouth daily.     atorvastatin 40 MG tablet  Commonly known as:  LIPITOR  Take 1 tablet (40 mg total) by mouth daily.     diclofenac sodium 1 % Gel  Commonly known as:  VOLTAREN  Apply 4 g topically 4 (four) times  daily. For shoulder pain     docusate sodium 100 MG capsule  Commonly known as:  COLACE  Take 1 capsule (100 mg total) by mouth 2 (two) times daily.     HYDROcodone-acetaminophen 5-325 MG per tablet  Commonly known as:  NORCO/VICODIN  Take 1-2 tablets by mouth every 4 (four) hours as needed.     insulin aspart 100 UNIT/ML injection  Commonly known as:  novoLOG  Inject 0-15 Units into the skin 3 (three) times daily with meals.     insulin aspart 100 UNIT/ML injection  Commonly known as:  novoLOG  Inject 0-5 Units into the skin at bedtime.     insulin glargine 100 UNIT/ML injection  Commonly known as:  LANTUS  Inject 0.2 mLs (20 Units total) into the skin daily.     lisinopril 20 MG tablet  Commonly known as:  PRINIVIL,ZESTRIL  Take 1 tablet (20 mg total) by mouth daily.     polyethylene glycol packet  Commonly known as:  MIRALAX / GLYCOLAX  Take 17-34 g by mouth daily as needed for moderate constipation.        Discharge Instructions: Please refer to Patient Instructions section of EMR for full details.  Patient was counseled important signs and symptoms that should prompt return to medical care, changes in medications, dietary instructions, activity restrictions, and follow up appointments.   Follow-Up Appointments: Follow-up Information    Follow up with Tinnie Gens, MD On 06/24/2014.   Specialty:  Vascular Surgery   Why:  8:30am   Contact information:   [address] [place] [address] [phone]       Follow up with Melancon, York Ram, MD On 06/09/2014.   Specialty:  Family Medicine   Why:  8:30am   Contact information:   [id number] [place] [address] [phone]       Vivi Barrack, MD 05/26/2014, 2:29 PM PGY-1, [place]

## 2014-05-22 NOTE — Progress Notes (Signed)
Lexiscan stress test completed - unsure when pt will have surgery - so we didn't offer her food and drink post test.  Pt tolerated stress test with only mild SOB that subsided prior to end of stress test.

## 2014-05-22 NOTE — Progress Notes (Signed)
Lexiscan myoview completed without complication. Pending final result by [facility] Radiology.   Hilbert Corrigan PA Pager: [phone]

## 2014-05-22 NOTE — Progress Notes (Signed)
VASCULAR LAB PRELIMINARY  PRELIMINARY  PRELIMINARY  PRELIMINARY    Carotid:  Bilateral:  1-39% ICA stenosis. Left:  ECA stenosis.  Bilateral:  Vertebral artery flow is antegrade.       Lower Extremity Vein Map     Right Great Saphenous Vein   Segment Diameter Comment  1. Origin 5.5 mm   2. High Thigh 3.6 mm branch  3. Mid Thigh 2.1 mm   4. Low Thigh 2.7 mm   5. At Knee 2.5 mm branch  6. High Calf 2.7 mm   7. Low Calf 2.1 mm   8. Ankle 1.5 mm branch   mm    mm    mm     Left Greater Saphenous Vein   Diameter Comment  1. Origin  4.5 mm   2. High Thigh 3.5 mm branch  3. Mid Thigh 3.7 mm   4. Low Thigh 3.3 mm   5. Left Knee 3.4 mm Multiple branches  6. High Calf 1.7 mm branch  7. Mid Calf 2.3 mm   8. Left Ankle                 2.0 mm     Kimberly Lane  05/22/2014 5:11 PM

## 2014-05-23 ENCOUNTER — Encounter (HOSPITAL_COMMUNITY)
Admission: AD | Disposition: A | Discharge status: Rehabilitation Facility | Payer: Self-pay | Source: Ambulatory Visit | Attending: Family Medicine

## 2014-05-23 ENCOUNTER — Inpatient Hospital Stay (HOSPITAL_COMMUNITY): Payer: Medicaid Other | Admitting: Certified Registered"

## 2014-05-23 ENCOUNTER — Telehealth: Payer: Self-pay | Admitting: Vascular Surgery

## 2014-05-23 ENCOUNTER — Encounter (HOSPITAL_COMMUNITY): Payer: Self-pay | Admitting: Anesthesiology

## 2014-05-23 DIAGNOSIS — Z89519 Acquired absence of unspecified leg below knee: Secondary | ICD-10-CM

## 2014-05-23 DIAGNOSIS — I70261 Atherosclerosis of native arteries of extremities with gangrene, right leg: Secondary | ICD-10-CM

## 2014-05-23 DIAGNOSIS — Z89511 Acquired absence of right leg below knee: Secondary | ICD-10-CM

## 2014-05-23 HISTORY — PX: AMPUTATION: SHX166

## 2014-05-23 LAB — BASIC METABOLIC PANEL
Anion gap: 10 (ref 5–15)
BUN: 6 mg/dL (ref 6–23)
CO2: 29 mmol/L (ref 19–32)
Calcium: 9.2 mg/dL (ref 8.4–10.5)
Chloride: 100 mmol/L (ref 96–112)
Creatinine, Ser: 0.55 mg/dL (ref 0.50–1.10)
GLUCOSE: 141 mg/dL — AB (ref 70–99)
POTASSIUM: 3.6 mmol/L (ref 3.5–5.1)
SODIUM: 139 mmol/L (ref 135–145)

## 2014-05-23 LAB — CBC
HEMATOCRIT: 30.9 % — AB (ref 36.0–46.0)
Hemoglobin: 10.2 g/dL — ABNORMAL LOW (ref 12.0–15.0)
MCH: 26.5 pg (ref 26.0–34.0)
MCHC: 33 g/dL (ref 30.0–36.0)
MCV: 80.3 fL (ref 78.0–100.0)
Platelets: 400 10*3/uL (ref 150–400)
RBC: 3.85 MIL/uL — AB (ref 3.87–5.11)
RDW: 13.4 % (ref 11.5–15.5)
WBC: 10.4 10*3/uL (ref 4.0–10.5)

## 2014-05-23 LAB — GLUCOSE, CAPILLARY
GLUCOSE-CAPILLARY: 248 mg/dL — AB (ref 70–99)
GLUCOSE-CAPILLARY: 294 mg/dL — AB (ref 70–99)
Glucose-Capillary: 163 mg/dL — ABNORMAL HIGH (ref 70–99)
Glucose-Capillary: 145 mg/dL — ABNORMAL HIGH (ref 70–99)
Glucose-Capillary: 248 mg/dL — ABNORMAL HIGH (ref 70–99)
Glucose-Capillary: 313 mg/dL — ABNORMAL HIGH (ref 70–99)

## 2014-05-23 LAB — SURGICAL PCR SCREEN
MRSA, PCR: NEGATIVE
STAPHYLOCOCCUS AUREUS: NEGATIVE

## 2014-05-23 SURGERY — CREATION, BYPASS, ARTERIAL, FEMORAL TO TIBIAL, USING GRAFT
Anesthesia: General | Laterality: Right

## 2014-05-23 SURGERY — AMPUTATION BELOW KNEE
Anesthesia: General | Site: Leg Lower | Laterality: Right

## 2014-05-23 MED ORDER — NEOSTIGMINE METHYLSULFATE 10 MG/10ML IV SOLN
INTRAVENOUS | Status: DC | PRN
  Administered 2014-05-23: 2.5 mg via INTRAVENOUS

## 2014-05-23 MED ORDER — GLYCOPYRROLATE 0.2 MG/ML IJ SOLN
INTRAMUSCULAR | Status: DC | PRN
  Administered 2014-05-23: 0.3 mg via INTRAVENOUS

## 2014-05-23 MED ORDER — 0.9 % SODIUM CHLORIDE (POUR BTL) OPTIME
TOPICAL | Status: DC | PRN
  Administered 2014-05-23: 1000 mL

## 2014-05-23 MED ORDER — HYDROMORPHONE HCL 1 MG/ML IJ SOLN
0.2500 mg | INTRAMUSCULAR | Status: DC | PRN
  Administered 2014-05-23 (×5): 0.5 mg via INTRAVENOUS

## 2014-05-23 MED ORDER — MIDAZOLAM HCL 2 MG/2ML IJ SOLN: 0.5000 mg | Freq: Once | INTRAMUSCULAR | Status: DC | PRN

## 2014-05-23 MED ORDER — HYDRALAZINE HCL 20 MG/ML IJ SOLN
5.0000 mg | INTRAMUSCULAR | Status: AC | PRN
  Administered 2014-05-23 – 2014-05-24 (×2): 5 mg via INTRAVENOUS
  Filled 2014-05-23 (×2): qty 1

## 2014-05-23 MED ORDER — DEXTROSE 5 % IV SOLN
1.5000 g | INTRAVENOUS | Status: DC
  Filled 2014-05-23: qty 1.5

## 2014-05-23 MED ORDER — MIDAZOLAM HCL 2 MG/2ML IJ SOLN
INTRAMUSCULAR | Status: AC
  Filled 2014-05-23: qty 2

## 2014-05-23 MED ORDER — FENTANYL CITRATE 0.05 MG/ML IJ SOLN
INTRAMUSCULAR | Status: AC
  Filled 2014-05-23: qty 5

## 2014-05-23 MED ORDER — ROCURONIUM BROMIDE 100 MG/10ML IV SOLN
INTRAVENOUS | Status: DC | PRN
  Administered 2014-05-23: 25 mg via INTRAVENOUS

## 2014-05-23 MED ORDER — DEXAMETHASONE SODIUM PHOSPHATE 4 MG/ML IJ SOLN
INTRAMUSCULAR | Status: DC | PRN
  Administered 2014-05-23: 4 mg via INTRAVENOUS

## 2014-05-23 MED ORDER — MEPERIDINE HCL 25 MG/ML IJ SOLN: 6.2500 mg | INTRAMUSCULAR | Status: DC | PRN

## 2014-05-23 MED ORDER — PROPOFOL 10 MG/ML IV BOLUS
INTRAVENOUS | Status: DC | PRN
  Administered 2014-05-23: 30 mg via INTRAVENOUS
  Administered 2014-05-23: 50 mg via INTRAVENOUS
  Administered 2014-05-23: 100 mg via INTRAVENOUS

## 2014-05-23 MED ORDER — ONDANSETRON HCL 4 MG/2ML IJ SOLN
INTRAMUSCULAR | Status: DC | PRN
  Administered 2014-05-23: 4 mg via INTRAVENOUS

## 2014-05-23 MED ORDER — ONDANSETRON HCL 4 MG/2ML IJ SOLN
4.0000 mg | Freq: Four times a day (QID) | INTRAMUSCULAR | Status: DC | PRN
  Administered 2014-05-25 – 2014-05-26 (×2): 4 mg via INTRAVENOUS
  Filled 2014-05-23 (×3): qty 2

## 2014-05-23 MED ORDER — INSULIN ASPART 100 UNIT/ML ~~LOC~~ SOLN: 0.0000 [IU] | Freq: Three times a day (TID) | SUBCUTANEOUS | Status: DC

## 2014-05-23 MED ORDER — LACTATED RINGERS IV SOLN
INTRAVENOUS | Status: DC | PRN
  Administered 2014-05-23 (×2): via INTRAVENOUS

## 2014-05-23 MED ORDER — HYDROMORPHONE HCL 1 MG/ML IJ SOLN
INTRAMUSCULAR | Status: AC
  Filled 2014-05-23: qty 2

## 2014-05-23 MED ORDER — DOCUSATE SODIUM 100 MG PO CAPS
100.0000 mg | ORAL_CAPSULE | Freq: Every day | ORAL | Status: DC
  Administered 2014-05-24 – 2014-05-25 (×2): 100 mg via ORAL
  Filled 2014-05-23 (×3): qty 1

## 2014-05-23 MED ORDER — CEFUROXIME SODIUM 1.5 G IJ SOLR
1.5000 g | Freq: Two times a day (BID) | INTRAMUSCULAR | Status: AC
  Administered 2014-05-23 – 2014-05-24 (×2): 1.5 g via INTRAVENOUS
  Filled 2014-05-23 (×2): qty 1.5

## 2014-05-23 MED ORDER — POTASSIUM CHLORIDE CRYS ER 20 MEQ PO TBCR: 20.0000 meq | EXTENDED_RELEASE_TABLET | Freq: Every day | ORAL | Status: DC | PRN

## 2014-05-23 MED ORDER — PANTOPRAZOLE SODIUM 40 MG PO TBEC
40.0000 mg | DELAYED_RELEASE_TABLET | Freq: Every day | ORAL | Status: DC
  Administered 2014-05-24 – 2014-05-25 (×2): 40 mg via ORAL
  Filled 2014-05-23 (×4): qty 1

## 2014-05-23 MED ORDER — HYDROMORPHONE HCL 1 MG/ML IJ SOLN
INTRAMUSCULAR | Status: AC
  Filled 2014-05-23: qty 1

## 2014-05-23 MED ORDER — FENTANYL CITRATE 0.05 MG/ML IJ SOLN
INTRAMUSCULAR | Status: DC | PRN
  Administered 2014-05-23 (×2): 50 ug via INTRAVENOUS
  Administered 2014-05-23: 200 ug via INTRAVENOUS
  Administered 2014-05-23: 50 ug via INTRAVENOUS

## 2014-05-23 MED ORDER — HYDROMORPHONE HCL 1 MG/ML IJ SOLN
0.2500 mg | INTRAMUSCULAR | Status: DC | PRN
  Administered 2014-05-23: 0.5 mg via INTRAVENOUS

## 2014-05-23 MED ORDER — PROMETHAZINE HCL 25 MG/ML IJ SOLN: 6.2500 mg | INTRAMUSCULAR | Status: DC | PRN

## 2014-05-23 MED ORDER — MORPHINE SULFATE 2 MG/ML IJ SOLN
2.0000 mg | INTRAMUSCULAR | Status: DC | PRN
  Administered 2014-05-23 – 2014-05-24 (×7): 3 mg via INTRAVENOUS
  Filled 2014-05-23 (×7): qty 2

## 2014-05-23 MED ORDER — LIDOCAINE HCL (CARDIAC) 20 MG/ML IV SOLN
INTRAVENOUS | Status: DC | PRN
  Administered 2014-05-23: 50 mg via INTRAVENOUS
  Administered 2014-05-23: 30 mg via INTRAVENOUS

## 2014-05-23 MED ORDER — INSULIN ASPART 100 UNIT/ML ~~LOC~~ SOLN
SUBCUTANEOUS | Status: AC
  Filled 2014-05-23: qty 5

## 2014-05-23 MED ORDER — LABETALOL HCL 5 MG/ML IV SOLN
10.0000 mg | INTRAVENOUS | Status: DC | PRN
  Filled 2014-05-23: qty 4

## 2014-05-23 MED ORDER — METOPROLOL TARTRATE 1 MG/ML IV SOLN: 2.0000 mg | INTRAVENOUS | Status: DC | PRN

## 2014-05-23 MED ORDER — LACTATED RINGERS IV SOLN
INTRAVENOUS | Status: DC
  Administered 2014-05-23: 08:00:00 via INTRAVENOUS

## 2014-05-23 MED ORDER — INSULIN ASPART 100 UNIT/ML ~~LOC~~ SOLN
0.0000 [IU] | Freq: Every day | SUBCUTANEOUS | Status: DC
  Administered 2014-05-23: 3 [IU] via SUBCUTANEOUS

## 2014-05-23 SURGICAL SUPPLY — 75 items
BANDAGE ELASTIC 4 VELCRO ST LF (GAUZE/BANDAGES/DRESSINGS) ×3 IMPLANT
BANDAGE ELASTIC 6 VELCRO ST LF (GAUZE/BANDAGES/DRESSINGS) ×3 IMPLANT
BANDAGE ESMARK 6X9 LF (GAUZE/BANDAGES/DRESSINGS) IMPLANT
BLADE SAW RECIP 87.9 MT (BLADE) ×3 IMPLANT
BNDG COHESIVE 6X5 TAN STRL LF (GAUZE/BANDAGES/DRESSINGS) ×3 IMPLANT
BNDG ESMARK 6X9 LF (GAUZE/BANDAGES/DRESSINGS)
BNDG GAUZE ELAST 4 BULKY (GAUZE/BANDAGES/DRESSINGS) ×3 IMPLANT
CANISTER SUCTION 2500CC (MISCELLANEOUS) ×3 IMPLANT
CLIP TI MEDIUM 24 (CLIP) ×3 IMPLANT
CLIP TI WIDE RED SMALL 24 (CLIP) ×3 IMPLANT
COVER SURGICAL LIGHT HANDLE (MISCELLANEOUS) ×3 IMPLANT
DECANTER SPIKE VIAL GLASS SM (MISCELLANEOUS) IMPLANT
DRAIN SNY 10X20 3/4 PERF (WOUND CARE) IMPLANT
DRAPE EXTREMITY T 121X128X90 (DRAPE) ×3 IMPLANT
DRAPE ORTHO SPLIT 77X108 STRL (DRAPES) ×1
DRAPE SURG ORHT 6 SPLT 77X108 (DRAPES) ×2 IMPLANT
DRAPE X-RAY CASS 24X20 (DRAPES) IMPLANT
DRSG ADAPTIC 3X8 NADH LF (GAUZE/BANDAGES/DRESSINGS) ×3 IMPLANT
DRSG EMULSION OIL 3X3 NADH (GAUZE/BANDAGES/DRESSINGS) ×3 IMPLANT
ELECT REM PT RETURN 9FT ADLT (ELECTROSURGICAL) ×3
ELECTRODE REM PT RTRN 9FT ADLT (ELECTROSURGICAL) ×2 IMPLANT
EVACUATOR 3/16  PVC DRAIN (DRAIN) ×1
EVACUATOR 3/16 PVC DRAIN (DRAIN) ×2 IMPLANT
EVACUATOR SILICONE 100CC (DRAIN) IMPLANT
GAUZE SPONGE 4X4 12PLY STRL (GAUZE/BANDAGES/DRESSINGS) ×3 IMPLANT
GLOVE BIO SURGEON STRL SZ 6.5 (GLOVE) ×6 IMPLANT
GLOVE BIOGEL PI IND STRL 6.5 (GLOVE) ×2 IMPLANT
GLOVE BIOGEL PI IND STRL 7.0 (GLOVE) ×2 IMPLANT
GLOVE BIOGEL PI IND STRL 7.5 (GLOVE) ×2 IMPLANT
GLOVE BIOGEL PI INDICATOR 6.5 (GLOVE) ×1
GLOVE BIOGEL PI INDICATOR 7.0 (GLOVE) ×1
GLOVE BIOGEL PI INDICATOR 7.5 (GLOVE) ×1
GLOVE SS BIOGEL STRL SZ 7 (GLOVE) ×2 IMPLANT
GLOVE SUPERSENSE BIOGEL SZ 7 (GLOVE) ×1
GOWN STRL REUS W/ TWL LRG LVL3 (GOWN DISPOSABLE) ×6 IMPLANT
GOWN STRL REUS W/TWL LRG LVL3 (GOWN DISPOSABLE) ×3
IMMOBILIZER KNEE 20 (SOFTGOODS) ×3 IMPLANT
INSERT FOGARTY SM (MISCELLANEOUS) ×3 IMPLANT
KIT BASIN OR (CUSTOM PROCEDURE TRAY) ×3 IMPLANT
KIT ROOM TURNOVER OR (KITS) ×3 IMPLANT
LIQUID BAND (GAUZE/BANDAGES/DRESSINGS) ×3 IMPLANT
NS IRRIG 1000ML POUR BTL (IV SOLUTION) ×6 IMPLANT
PACK GENERAL/GYN (CUSTOM PROCEDURE TRAY) ×3 IMPLANT
PACK PERIPHERAL VASCULAR (CUSTOM PROCEDURE TRAY) ×3 IMPLANT
PAD ABD 8X10 STRL (GAUZE/BANDAGES/DRESSINGS) ×3 IMPLANT
PAD ARMBOARD 7.5X6 YLW CONV (MISCELLANEOUS) ×6 IMPLANT
PADDING CAST COTTON 6X4 STRL (CAST SUPPLIES) IMPLANT
SET COLLECT BLD 21X3/4 12 (NEEDLE) IMPLANT
SPECIMEN JAR SMALL (MISCELLANEOUS) ×3 IMPLANT
SPONGE GAUZE 4X4 12PLY STER LF (GAUZE/BANDAGES/DRESSINGS) ×3 IMPLANT
STAPLER VISISTAT 35W (STAPLE) ×3 IMPLANT
STOCKINETTE IMPERVIOUS LG (DRAPES) ×3 IMPLANT
STOPCOCK 4 WAY LG BORE MALE ST (IV SETS) IMPLANT
SUT ETHILON 3 0 PS 1 (SUTURE) ×3 IMPLANT
SUT PROLENE 5 0 C1 (SUTURE) IMPLANT
SUT PROLENE 6 0 BV (SUTURE) IMPLANT
SUT PROLENE 6 0 CC (SUTURE) ×6 IMPLANT
SUT PROLENE 7 0 BV1 MDA (SUTURE) IMPLANT
SUT SILK 2 0 (SUTURE) ×1
SUT SILK 2 0 SH (SUTURE) ×3 IMPLANT
SUT SILK 2 0 SH CR/8 (SUTURE) ×6 IMPLANT
SUT SILK 2-0 18XBRD TIE 12 (SUTURE) ×2 IMPLANT
SUT SILK 3 0 (SUTURE)
SUT SILK 3-0 18XBRD TIE 12 (SUTURE) IMPLANT
SUT VIC AB 2-0 CT1 18 (SUTURE) ×3 IMPLANT
SUT VIC AB 2-0 CTX 36 (SUTURE) ×9 IMPLANT
SUT VIC AB 3-0 SH 18 (SUTURE) IMPLANT
SUT VIC AB 3-0 SH 27 (SUTURE) ×2
SUT VIC AB 3-0 SH 27X BRD (SUTURE) ×4 IMPLANT
SWAB COLLECTION DEVICE MRSA (MISCELLANEOUS) IMPLANT
TRAY FOLEY CATH 16FRSI W/METER (SET/KITS/TRAYS/PACK) ×3 IMPLANT
TUBE ANAEROBIC SPECIMEN COL (MISCELLANEOUS) IMPLANT
TUBING EXTENTION W/L.L. (IV SETS) IMPLANT
UNDERPAD 30X30 INCONTINENT (UNDERPADS AND DIAPERS) ×3 IMPLANT
WATER STERILE IRR 1000ML POUR (IV SOLUTION) ×3 IMPLANT

## 2014-05-23 NOTE — H&P (View-Only) (Signed)
Patient will require right leg tibial bypass graft for limb salvage. I will try to get this done this week by one of my partners. I am going to have cardiology evaluate for clearance. Carotid Doppler studies and lower extremity vein mapping are ordered.  Kimberly Lane

## 2014-05-23 NOTE — Progress Notes (Signed)
Pt family requesting pt right lower leg( that was amputated) for cultural purposes. Spoke with Pathology and was told that pt/family can pick up limb in 2 weeks per protocol and to bring ID and fill out forms. Made husband aware of information through family interpreter. Questions answered.

## 2014-05-23 NOTE — Progress Notes (Addendum)
Kimberly Lane is the interpreter his number is [phone], through telephonic interpreting. Interpretor explained to pt the need to check her blood sugar, and start another IV. Pt voices understanding.  Dr Kellie Simmering here to speak with the patient.

## 2014-05-23 NOTE — Anesthesia Postprocedure Evaluation (Signed)
  Anesthesia Post-op Note  Patient: Kimberly Lane  Procedure(s) Performed: Procedure(s): AMPUTATION BELOW KNEE (Right)  Patient Location: PACU  Anesthesia Type:General  Level of Consciousness: sedated, patient cooperative and responds to stimulation  Airway and Oxygen Therapy: Patient Spontanous Breathing and Patient connected to nasal cannula oxygen  Post-op Pain: mild  Post-op Assessment: Post-op Vital signs reviewed, Patient's Cardiovascular Status Stable, Respiratory Function Stable, Patent Airway, No signs of Nausea or vomiting and Pain level controlled  Post-op Vital Signs: Reviewed and stable  Last Vitals:  Filed Vitals:   05/23/14 1200  BP:   Pulse: 88  Temp: 37.2 C  Resp: 20    Complications: No apparent anesthesia complications

## 2014-05-23 NOTE — Progress Notes (Signed)
Family Medicine Teaching Service Daily Progress Note Intern Pager: [phone]  Patient name: Kimberly Lane Medical record number: [id number] Date of birth: 1960-07-02 Age: 54 y.o. Gender: female  Primary Care Provider: Marina Goodell, MD Consultants: None Code Status: Full  Pt Overview and Major Events to Date:  3/22- Admitted with infection of RLE; Vanc/zosyn started 3/24- Vascular consulted for PAD and concern for gangrene 4/1 - Patient underwent right BKA  ABX/Culture Vanc 3/22 >> 3/24 Zosyn 3/22 >> 3/24 Levaquin 3/24 >> Clinda 3/24 >> 3/31  Blood cx 3/22 >> NG Final.   Assessment and Plan: Kimberly Lane is a 54 y.o. female presenting with foot infection / nonhealing wound 2/2 blunt traumatic injury to right 5th digit. PMH is significant for DM2, HTN, hx of primary TB and treated, HLD.   # PAD, Dry Gangrene of right fifth toe, s/p right BKA: ABIs ~0.4. Complicated by uncontrolled diabetes.  - Vascular surgery following, appreciate assistance.  - Continue levaquin pending vascular recs, though anticipate discontinuing s/p amputation.  - On ASA and statin.  - PT and OT consulted.  - Norco prn pain - Post-op f/u May 3rd at 8:30 with dr. Kellie Simmering  #T2DM: A1c 9.6. On Amaryl and Metformin at home. Per discussion with Care Management and Diabetes Coordinator she endorses compliance. Has been on insulin in the past. Very likely may need insulin at discharge, though suspect less hyperglycemia s/p BKA. Would be ideal to get her on a home regimen prior to discharge.  - holding home po meds.  - Lantus at 15u and continue moderate SSI.   # Diarrhea: Likely associated with antibiotics. C diff negative. Vitals and electrolytes stable. - Probiotics - Continue to monitor  #HTN: May be on multiple meds due to noncompliance. Will be careful not to precipitate hypotension.  - Continue amlodipine 10mg  and lisinopril 20mg  - Normotensive here.  - Labetalol prn  #High risk social situation: Patient is  a [name] refugee and speaks a dialect of Arabic, she has little social support though she has apparently been able to obtain meds through the MAP program. She also reports compliance. Her husband is currently in [place] and has no other family in the [place].  - SW for [facility] / PT / Medication needs at discharge.  - CM for meds. > pt. Has [employer] card and access to MAP.  - Will ensure ability to maintain compliance prior to d/c.   #Normochromic anemia: Mild, prior to admission baseline 12-13. Stable.  - has been stable around 10-11 here.  - Continue to monitor CBC's   FEN/GI: Regular diet, KVO IVF Prophylaxis: heparin SubQ   Disposition:  Admitted to floor. Dispo pending PT recs and placement.    Subjective:   Saw patient after surgery this afternoon. Interpreter used. Complained of pain at surgical site and asking for water. No other complaints.   Objective: Temp:  [97.9 F (36.6 C)-98.6 F (37 C)] 97.9 F (36.6 C) (04/01 0426) Pulse Rate:  [72-100] 80 (04/01 0426) Resp:  [18] 18 (04/01 0426) BP: (134-166)/(47-66) 139/66 mmHg (04/01 0426) SpO2:  [99 %-100 %] 99 % (04/01 0426) Physical Exam: General: Female lying in bed appearing in pain Cardiovascular: RRR, systolic 2/6 ejection murmur with radiation to b/l carotids.  Respiratory: CTAB, appropriate rate, unlabored.  Abdomen: S, NT, No guarding, no rebound or peritoneal signs, ND, +BS Extremities: s/p right BKA. Dressing in place, dry and intact Neuro: alert and interactive. No focal deficits.    Laboratory:  Recent Labs Lab 05/21/14  [id number] 05/22/14 0410 05/23/14 0400  WBC 10.0 10.6* 10.4  HGB 9.8* 10.0* 10.2*  HCT 30.5* 31.0* 30.9*  PLT 359 384 400    Recent Labs Lab 05/21/14 0351 05/22/14 0410 05/23/14 0400  NA 138 138 139  K 3.7 3.6 3.6  CL 104 102 100  CO2 26 29 29   BUN 6 <5* 6  CREATININE 0.51 0.57 0.55  CALCIUM 8.9 9.3 9.2  GLUCOSE 185* 149* 141*    Recent Labs Lab 05/22/14 2212 05/23/14 0631  05/23/14 0807 05/23/14 1106 05/23/14 1155  [place], MD 05/23/2014, 8:18 AM PGY-1, [place] Intern pager: [phone], text pages welcome

## 2014-05-23 NOTE — Anesthesia Procedure Notes (Signed)
Procedure Name: Intubation Date/Time: 05/23/2014 9:31 AM Performed by: Jacquiline Doe A Pre-anesthesia Checklist: Patient identified, Timeout performed, Emergency Drugs available, Suction available and Patient being monitored Patient Re-evaluated:Patient Re-evaluated prior to inductionOxygen Delivery Method: Circle system utilized Preoxygenation: Pre-oxygenation with 100% oxygen Intubation Type: IV induction and Cricoid Pressure applied Ventilation: Mask ventilation without difficulty Laryngoscope Size: Mac and 4 Grade View: Grade I Tube type: Oral Tube size: 7.5 mm Number of attempts: 1 Airway Equipment and Method: Stylet Placement Confirmation: ETT inserted through vocal cords under direct vision,  breath sounds checked- equal and bilateral and positive ETCO2 Secured at: 22 cm Dental Injury: Teeth and Oropharynx as per pre-operative assessment

## 2014-05-23 NOTE — H&P (Signed)
Interpreter Everlean Patterson [phone] at [facility] telephone interpretor services helped interpret while in Holding area before coming back to OR. Gave this information to PACU RN before bringing patient to PACU.

## 2014-05-23 NOTE — Progress Notes (Signed)
Called to obtain an interpreter for pt to explain about pain medication, diet and  [place]. Interpreter unavailable. Family member that speaks english is at bedside. Explain to family member the above information. Answered questions. Monitoring will continue.

## 2014-05-23 NOTE — Anesthesia Preprocedure Evaluation (Signed)
Anesthesia Evaluation  Patient identified by MRN, date of birth, ID band Patient awake    Reviewed: Allergy & Precautions, NPO status , Patient's Chart, lab work & pertinent test results  History of Anesthesia Complications Negative for: history of anesthetic complications  Airway Mallampati: II  TM Distance: >3 FB Neck ROM: Full    Dental  (+) Chipped, Loose, Dental Advisory Given, Poor Dentition, Missing   Pulmonary neg pulmonary ROS,  H/o positive Tb test: treated breath sounds clear to auscultation        Cardiovascular hypertension, Pt. on medications - angina+ Peripheral Vascular Disease Rhythm:Regular Rate:Normal  05/22/14 stress test: no ischemia, EF 67%   Neuro/Psych negative neurological ROS     GI/Hepatic negative GI ROS, Neg liver ROS,   Endo/Other  diabetes (glu 145)  Renal/GU negative Renal ROS     Musculoskeletal   Abdominal   Peds  Hematology  (+) Blood dyscrasia (Hb 10.2), ,   Anesthesia Other Findings Dried rash/skin lesions R flank  Reproductive/Obstetrics                             Anesthesia Physical Anesthesia Plan  ASA: III  Anesthesia Plan: General   Post-op Pain Management:    Induction: Intravenous  Airway Management Planned: Oral ETT  Additional Equipment:   Intra-op Plan:   Post-operative Plan: Extubation in OR  Informed Consent: I have reviewed the patients History and Physical, chart, labs and discussed the procedure including the risks, benefits and alternatives for the proposed anesthesia with the patient or authorized representative who has indicated his/her understanding and acceptance.   Dental advisory given  Plan Discussed with: CRNA and Surgeon  Anesthesia Plan Comments: (Plan routine monitors, GETA Very difficult language barrier: arabic translation by telephone interpreter)        Anesthesia Quick Evaluation

## 2014-05-23 NOTE — Addendum Note (Signed)
Addendum  created 05/23/14 1313 by Fidela Juneau, CRNA   Modules edited: Anesthesia Attestations

## 2014-05-23 NOTE — Progress Notes (Signed)
Patient ID: Kimberly Lane, female   DOB: 22-Feb-1961, 54 y.o.   MRN: [id number] The patient was examined. I reviewed her arteriogram from earlier this week and also her vein map. Very difficult situation. She does have a faintly palpable popliteal pulse. The right foot has gangrene of her fifth toe extending down to the metatarsal head. The skin on the dorsum of the foot is quite thickened and she does have tenderness onto the dorsum of her foot as well.  Arteriogram revealed that she would require bypass onto the dorsum of her foot where the skin is quite thickened. She does not have adequate vein for femoral to dorsalis pedis bypass. She does have significant superficial femoral artery disease making this inflow of poor option as well. I feel that she has minimal if any chance for limb salvage even with heroic procedures. I would predict that this is a less than 10% chance for limb salvage. Feel that her risk for of this magnitude of surgery with very slight chance of success outweighs the benefit. Would recommend primary right below-knee amputation.

## 2014-05-23 NOTE — Progress Notes (Signed)
[facility]  CONSULT SERVICE NOTE  Pt s/p right BKA today. Will follow along for rehab needs.   Thanks  Meredith Staggers, MD, [facility] 05/23/2014

## 2014-05-23 NOTE — Progress Notes (Signed)
Pt unable to void. I/o cath per guidelines. Obtained 1000cc. Encouraged pt to drink fluid and eat. Monitoring will continue.

## 2014-05-23 NOTE — Telephone Encounter (Addendum)
-----   Message from Mena Goes, RN sent at 05/23/2014 10:56 AM EDT ----- Regarding: Schedule   ----- Message -----    From: Gabriel Earing, PA-C    Sent: 05/23/2014  10:45 AM      To: [facility] Charge Pool  S/p right BKA 05/23/14.  F/u with Dr. Kellie Simmering in 4 weeks.  Thanks, Samantha  notified patient of post op appt. on May 3rd at 8:30 with dr. Kellie Simmering

## 2014-05-23 NOTE — Interval H&P Note (Signed)
History and Physical Interval Note:  05/23/2014 9:05 AM  [name]  has presented today for surgery, with the diagnosis of cellulitis of right foot  The various methods of treatment have been discussed with the patient and family. After consideration of risks, benefits and other options for treatment, the patient has consented to  Procedure(s): leg bypass (Right) 5th toe amputation (Right) as a surgical intervention .  The patient's history has been reviewed, patient examined, no change in status, stable for surgery.  I have reviewed the patient's chart and labs.  Questions were answered to the patient's satisfaction.     LAWSON, JAMES  I examined the patient's right foot in the PACU. The right fifth toe is necrotic and the skin on the lateral and dorsum of the foot is hard and "woody" in consistency. The skin is quite firm and dry overlying the area where incision would need to be made for the dorsalis pedis artery. Saphenous vein is quite borderline on preoperative vein mapping. I do not think even with a successful bypass that foot amputation would heal in this patient because of the consistency of the skin and the terrible tibial disease. I have discussed this at length with the patient and her husband via an interpreter. Also got a second opinion from Dr. Sherren Mocha early who agreed with this assessment. I have recommended right below-knee amputation and have offered the patient waiting through the weekend to discuss this further with Dr. Trula Slade. After much discussion she decided to proceed today with right below-knee amputation. Sharyn Lull in PACU and Dr. Annye Asa of anesthesia or both present during this long discussion as was Dr. early.  We'll proceed with right BKA today

## 2014-05-23 NOTE — Transfer of Care (Signed)
Immediate Anesthesia Transfer of Care Note  Patient: Kimberly Lane  Procedure(s) Performed: Procedure(s): AMPUTATION BELOW KNEE (Right)  Patient Location: PACU  Anesthesia Type:General  Level of Consciousness: awake, sedated and responds to stimulation  Airway & Oxygen Therapy: Patient Spontanous Breathing and Patient connected to face mask oxygen  Post-op Assessment: Report given to RN, Post -op Vital signs reviewed and stable, Patient moving all extremities and Patient moving all extremities X 4  Post vital signs: Reviewed and stable  Last Vitals:  Filed Vitals:   05/23/14 1102  BP: 142/66  Pulse: 87  Temp: 37.7 C  Resp: 21    Complications: No apparent anesthesia complications

## 2014-05-23 NOTE — Op Note (Signed)
OPERATIVE REPORT  Date of Surgery: 05/13/2014 - 05/23/2014  Surgeon: Tinnie Gens, MD  Assistant: Leontine Locket PA  Pre-op Diagnosis: cellulitis of right foot; non-healing wound on right foot, gangrene right fifth toe with non-reconstructable tibial occlusive disease  Post-op Diagnosis: Same   Procedure: Procedure(s): AMPUTATION BELOW KNEE--right Anesthesia: Gen. endotracheal  EBL: Minimal  Complications: None  Procedure Details: The patient was taken the operating are placed in the supine position at which time satisfactory general endotracheal anesthesia was minister. Right lower extremity was prepped Betadine scrub and solution draped in routine sterile manner isolating the foot with impervious stockinette. Skin flaps were marked for a below-knee amputation basing the tibia and fibula about 4-5 inches distal to the knee joint. Posterior flap was twice the length of anterior flap. Incision was carried down through skin subcutaneous tissue with the scalpel. Muscle was divided with the Bovie. The tibia and fibula were clean proximally periosteal elevator divided with the Stryker saw. Tibia was beveled anteriorly and smoothed with a rasp. The neurovascular bundles were each individually ligated with 2-0 silk ties and ligatures as was the nerve. Posterior muscles were divided with the Bovie and specimen removed from the table. Adequate hemostasis was achieved. Hemovac drain was brought out medially and laterally secured with silk suture. Wound was then closed in layers with interrupted Vicryl for the fascia subcuticular 2-0 Vicryl and then skin staples. Sterile dressing applied including a knee immobilizer with the patient returning to the recovery room in stable condition   Tinnie Gens, MD 05/23/2014 10:41 AM

## 2014-05-23 NOTE — Progress Notes (Signed)
Pt does not speak [place].  Telephonic interpreting called and Kamal # [phone] interpreting for [place]. Patient's name verified, and birthdate verified by spouse.   I along with Dr. Kellie Simmering, Dr. Annye Asa and Jacquiline Doe, CRNA spoke with the patient.  Dr. Kellie Simmering discussed options at length and explained the procedure with the patient through the interpreter.  The patient and her spouse- Diamond Nickel voiced understanding of procedure, below the right knee amputation, and gave consent, denying any questions, and stating that she did not want to wait until next week.  Because of the pain, she wanted to go ahead with the surgery.  Dr. Kellie Simmering initialled her right leg just below the knee, and that was explained to her, along with what to expect when she wakes up from surgery. Verified that pt has been NPO, no metal in her body, no belongings with her, no glasses, contacts, hearing aids jewelry. Spouse instructed on where to wait and that Dr. Aletha Halim speak with him after surgery.

## 2014-05-24 DIAGNOSIS — Z89511 Acquired absence of right leg below knee: Secondary | ICD-10-CM | POA: Insufficient documentation

## 2014-05-24 LAB — CBC
HEMATOCRIT: 28.3 % — AB (ref 36.0–46.0)
Hemoglobin: 9.2 g/dL — ABNORMAL LOW (ref 12.0–15.0)
MCH: 25.8 pg — AB (ref 26.0–34.0)
MCHC: 32.5 g/dL (ref 30.0–36.0)
MCV: 79.3 fL (ref 78.0–100.0)
PLATELETS: 421 10*3/uL — AB (ref 150–400)
RBC: 3.57 MIL/uL — AB (ref 3.87–5.11)
RDW: 13.3 % (ref 11.5–15.5)
WBC: 15 10*3/uL — ABNORMAL HIGH (ref 4.0–10.5)

## 2014-05-24 LAB — BASIC METABOLIC PANEL
ANION GAP: 5 (ref 5–15)
CALCIUM: 8.7 mg/dL (ref 8.4–10.5)
CO2: 31 mmol/L (ref 19–32)
CREATININE: 0.52 mg/dL (ref 0.50–1.10)
Chloride: 98 mmol/L (ref 96–112)
GLUCOSE: 283 mg/dL — AB (ref 70–99)
POTASSIUM: 3.3 mmol/L — AB (ref 3.5–5.1)
Sodium: 134 mmol/L — ABNORMAL LOW (ref 135–145)

## 2014-05-24 LAB — GLUCOSE, CAPILLARY
GLUCOSE-CAPILLARY: 200 mg/dL — AB (ref 70–99)
Glucose-Capillary: 249 mg/dL — ABNORMAL HIGH (ref 70–99)
Glucose-Capillary: 394 mg/dL — ABNORMAL HIGH (ref 70–99)
Glucose-Capillary: 286 mg/dL — ABNORMAL HIGH (ref 70–99)

## 2014-05-24 MED ORDER — LABETALOL HCL 5 MG/ML IV SOLN
10.0000 mg | INTRAVENOUS | Status: DC | PRN
  Filled 2014-05-24: qty 4

## 2014-05-24 MED ORDER — HYDRALAZINE HCL 20 MG/ML IJ SOLN
10.0000 mg | INTRAMUSCULAR | Status: DC | PRN
Start: 2014-05-24 — End: 2014-05-24

## 2014-05-24 MED ORDER — MORPHINE SULFATE 4 MG/ML IJ SOLN
4.0000 mg | INTRAMUSCULAR | Status: DC | PRN
  Administered 2014-05-24 – 2014-05-25 (×7): 4 mg via INTRAVENOUS
  Filled 2014-05-24 (×7): qty 1

## 2014-05-24 NOTE — Progress Notes (Signed)
Family Medicine Teaching Service Daily Progress Note Intern Pager: [phone]  Patient name: Kimberly Lane Medical record number: [id number] Date of birth: 01-Jul-1960 Age: 54 y.o. Gender: female  Primary Care Provider: Marina Goodell, MD Consultants: None Code Status: Full  Pt Overview and Major Events to Date:  3/22- Admitted with infection of RLE; Vanc/zosyn started 3/24- Vascular consulted for PAD and concern for gangrene 4/1 - Patient underwent right BKA  ABX/Culture Vanc 3/22 >> 3/24 Zosyn 3/22 >> 3/24 Levaquin 3/24 >> 4/1 Clinda 3/24 >> 3/31 Cefuroxime 4/1 > 4/2 (Post-BKA protocol)  Blood cx 3/22 >> NG Final.   Assessment and Plan: Kimberly Lane is a 54 y.o. female presenting with foot infection / nonhealing wound 2/2 blunt traumatic injury to right 5th digit. PMH is significant for DM2, HTN, hx of primary TB and treated, HLD.   PAD, Dry Gangrene of right fifth toe, s/p right BKA 4/1: ABIs ~0.4. Complicated by uncontrolled diabetes.  - Reactive leukocytosis noted, will continue to monitor - D/C levaquin and continue cefuroxime today x 2 doses per [facility] - Continue ASA and statin.  - PT and OT consulted, inpatient rehab also following. - Norco prn pain, IV morphine prn breakthrough  - Post-op f/u May 3rd at 8:30 with dr. Kellie Simmering  T2DM: A1c 9.6. On Amaryl and Metformin at home with reported compliance. Very likely may need insulin at discharge (has taken in past), though suspect less hyperglycemia s/p BKA. Would be ideal to get her on a home regimen prior to discharge.  - Holding home po meds.  - Lantus at 15u and continue moderate SSI AC/HS.   Diarrhea: Likely associated with antibiotics (clindamycin has been stopped). C diff negative. Vitals and electrolytes stable. - Probiotics - Continue to monitor  HTN:  - Continue amlodipine 10mg  and lisinopril 20mg  - Labetalol prn  High risk social situation: Patient is a [name] refugee and speaks a dialect of Arabic, she has little  social support though she has apparently been able to obtains meds through the [employer] program. Her husband is currently in [place] and has no other family in the [place].  - SW for [facility] / PT / Medication needs at discharge.  - CM for meds. > pt. has orange card and access to MAP.   Normochromic anemia: Mild, prior to admission baseline 12-13. Stable post-op. - Continue to monitor  FEN/GI: Carb-modified/heart-healthy diet, saline lock Prophylaxis: heparin SubQ   Disposition: Continued postoperative care, PT/OT recommendations.   Subjective:   Patient reports pain not controlled by morphine keeping her from sleeping overnight. No problems with breakfast.   Objective: Temp:  [98.3 F (36.8 C)-99.8 F (37.7 C)] 99.1 F (37.3 C) (04/02 0322) Pulse Rate:  [72-90] 84 (04/02 0322) Resp:  [13-21] 18 (04/02 0322) BP: (142-177)/(55-82) 172/63 mmHg (04/02 0322) SpO2:  [96 %-100 %] 100 % (04/02 0322) Physical Exam: General: Female sitting up in bed eating breakfast with husband in no distress Cardiovascular: RRR, systolic 2/6 ejection murmur at RUSB Respiratory: CTAB, appropriate rate, unlabored.  Abdomen: S, NT, No guarding, no rebound or peritoneal signs, ND, +BS Extremities: s/p right BKA. Dressing in place, dry and intact without notable tracking of erythema Neuro: alert and interactive. No focal deficits.    Laboratory:  Recent Labs Lab 05/22/14 0410 05/23/14 0400 05/24/14 0455  WBC 10.6* 10.4 15.0*  HGB 10.0* 10.2* 9.2*  HCT 31.0* 30.9* 28.3*  PLT 384 400 421*    Recent Labs Lab 05/22/14 0410 05/23/14 0400 05/24/14 0455  NA 138  139 134*  K 3.6 3.6 3.3*  CL 102 100 98  CO2 29 29 31   BUN <5* 6 <5*  CREATININE 0.57 0.55 0.52  CALCIUM 9.3 9.2 8.7  GLUCOSE 149* 141* 283*    Recent Labs Lab 05/23/14 1106 05/23/14 1155 05/23/14 1635 05/23/14 2051 05/24/14 0626  GLUCAP 248* 248* 313* 294* 249*     Patrecia Pour, MD 05/24/2014, 7:20 AM PGY-2, [facility] Intern pager: [phone], text pages welcome

## 2014-05-24 NOTE — Evaluation (Signed)
Physical Therapy Evaluation Patient Details Name: Kimberly Lane MRN: [id number] DOB: 1960-04-17 Today's Date: 05/24/2014   History of Present Illness  54 y.o. [name] speaking female admitted with PAD, Dry Gangrene of right fifth toe, s/p right BKA 4/1. Hx of T2DM, and HTN.  Clinical Impression  Patient is s/p above surgery resulting in functional limitations due to the deficits listed below (see PT Problem List). Tolerated bed mobility and transfer training with min-mod assist +2 today, completing a stand pivot transfer with RW for support. Tolerated exercises well. Very supportive husband. Pt very appreciative of therapy services. MRs. [place] will benefit from skilled PT to increase their independence and safety with mobility to allow discharge to the venue listed below.       Follow Up Recommendations CIR    Equipment Recommendations   (TBD at next venue of care)    Recommendations for Other Services Rehab consult     Precautions / Restrictions Precautions Precautions: Fall Restrictions Weight Bearing Restrictions: Yes RLE Weight Bearing: Weight bearing as tolerated      Mobility  Bed Mobility Overal bed mobility: Needs Assistance;+2 for physical assistance Bed Mobility: Supine to Sit     Supine to sit: Min assist;HOB elevated     General bed mobility comments: Min assist for initial trunk support, then tolerated unsupported sitting. Min assist for scoot with bed pad to rotate to edge of bed. Tactile cues for directions.  Transfers Overall transfer level: Needs assistance Equipment used: Rolling walker (2 wheeled) Transfers: Sit to/from [facility] Sit to Stand: Mod assist;+2 physical assistance Stand pivot transfers: Mod assist;+2 physical assistance       General transfer comment: Mod assist for boost and balance with cues for technique from lowest bed setting, tactile cues for hand placement. Required Rt knee block initially however progressed and was  able to pivot with small steps using the RW for support. Mod assist +2 for balance and walker placement. VC for technique via interpreter throughout. No buckling but attempts to sit prematurely requiring additional cues for upright posture.  Ambulation/Gait                Stairs            Wheelchair Mobility    Modified Rankin (Stroke Patients Only)       Balance Overall balance assessment: Needs assistance Sitting-balance support: Feet supported Sitting balance-Leahy Scale: Fair     Standing balance support: Bilateral upper extremity supported Standing balance-Leahy Scale: Zero                               Pertinent Vitals/Pain Pain Assessment: Faces Faces Pain Scale: Hurts whole lot Pain Location: rt residual limb Pain Intervention(s): Premedicated before session;Monitored during session;Repositioned;RN gave pain meds during session   Interpreter # [place] expects to be discharged to:: Private residence Living Arrangements: Spouse/significant other (husband) Available Help at Discharge: Family;Available 24 hours/day (husband is no longer working) Type of Home: Apartment Home Access: Level entry     Home Layout: Two level (Bathroom is upstairs) Home Equipment: Wheelchair - manual Additional Comments: Lives in [place]. - Has a bathtub     Prior Function Level of Independence: Needs assistance   Gait / Transfers Assistance Needed: Uses a wheelchair for mobility at home  ADL's / Homemaking Assistance Needed: Extended friends and family were assisting with ADLs  Hand Dominance        Extremity/Trunk Assessment   Upper Extremity Assessment: Generalized weakness           Lower Extremity Assessment: Defer to PT evaluation RLE Deficits / Details: Guarded due to surgery. Able to move slowly. Minimal knee extension.       Communication   Communication: Prefers language other than English   Cognition Arousal/Alertness: Awake/alert Behavior During Therapy: WFL for tasks assessed/performed Overall Cognitive Status: Difficult to assess                      General Comments General comments (skin integrity, edema, etc.): Resting HR 96 BPM. SpO2 99% on 2L supplemental O2 96% on room air. Spouse would like someone to assist them with locating a one level apt     Exercises General Exercises - Lower Extremity Ankle Circles/Pumps: Left;10 reps;AROM;Seated Long Arc Quad: Strengthening;Both;10 reps;[facility] Amputee Exercises Quad Sets: Right;10 reps;[facility]      Assessment/Plan    PT Assessment Patient needs continued PT services  PT Diagnosis Difficulty walking;Generalized weakness;Acute pain   PT Problem List Decreased strength;Decreased range of motion;Decreased activity tolerance;Decreased balance;Decreased mobility;Decreased knowledge of use of DME;Decreased knowledge of precautions;Pain  PT Treatment Interventions DME instruction;Gait training;Stair training;Functional mobility training;Therapeutic activities;Therapeutic exercise;Balance training;Neuromuscular re-education;Patient/family education;Wheelchair mobility training;Modalities   PT Goals (Current goals can be found in the Care Plan section) Acute Rehab PT Goals Patient Stated Goal: to get stronger and Find an accessible home PT Goal Formulation: With patient Time For Goal Achievement: 06/07/14 Potential to Achieve Goals: Good    Frequency Min 3X/week   Barriers to discharge Inaccessible home environment flight of steps    Co-evaluation PT/OT/SLP Co-Evaluation/Treatment: Yes Reason for Co-Treatment: Complexity of the patient's impairments (multi-system involvement) PT goals addressed during session: Mobility/safety with mobility;Balance;Proper use of DME;Strengthening/ROM OT goals addressed during session: ADL's and self-care       End of Session   Activity Tolerance: Patient limited  by fatigue;Patient limited by pain Patient left: in chair;with call bell/phone within reach;with family/visitor present;with nursing/sitter in room Nurse Communication: Mobility status;Patient requests pain meds;Precautions         Time: [phone] (Split by PT/OT 30 min each) PT Time Calculation (min) (ACUTE ONLY): 60 min   Charges:   PT Evaluation $Initial PT Evaluation Tier I: 1 Procedure PT Treatments $Therapeutic Activity: 8-22 mins   PT G Codes:        Ellouise Newer 05/24/2014, 11:39 AM Elayne Snare, [place]

## 2014-05-24 NOTE — Progress Notes (Signed)
Subjective: Interval History: none.. Expresses pain in her right below-knee amputation site. Tried to explain that this would be worse in the first 24-48 hours and then resolve   Objective: Vital signs in last 24 hours: Temp:  [98.3 F (36.8 C)-99.8 F (37.7 C)] 99.1 F (37.3 C) (04/02 0322) Pulse Rate:  [72-90] 84 (04/02 0322) Resp:  [13-21] 18 (04/02 0322) BP: (142-177)/(55-82) 172/63 mmHg (04/02 0322) SpO2:  [96 %-100 %] 100 % (04/02 0322)  Intake/Output from previous day: 04/01 0701 - 04/02 0700 In: 1500 [I.V.:1500] Out: 201 [Stool:1; Blood:200] Intake/Output this shift: Total I/O In: 240 [P.O.:240] Out: 20 [Drains:20]  Right BKA dressing intact. Drains with minimal output.  Lab Results:  Recent Labs  05/23/14 0400 05/24/14 0455  WBC 10.4 15.0*  HGB 10.2* 9.2*  HCT 30.9* 28.3*  PLT 400 421*   BMET  Recent Labs  05/23/14 0400 05/24/14 0455  NA 139 134*  K 3.6 3.3*  CL 100 98  CO2 29 31  GLUCOSE 141* 283*  BUN 6 <5*  CREATININE 0.55 0.52  CALCIUM 9.2 8.7    Studies/Results: Dg Chest 2 View  05/06/2014   CLINICAL DATA:  Cough and fever for several days  EXAM: CHEST  2 VIEW  COMPARISON:  May 04, 2010  FINDINGS: There is no edema or consolidation. Heart is upper normal in size with pulmonary vascularity within normal limits. No adenopathy. There is degenerative change in the lower thoracic spine.  IMPRESSION: No edema or consolidation.   Electronically Signed   By: Lowella Grip III M.D.   On: 05/06/2014 16:37   Mr Toes Right Wo/w Cm  05/14/2014   CLINICAL DATA:  Osteomyelitis, history of diabetes, cellulitis of the right foot  EXAM: MRI OF THE RIGHT TOES WITHOUT AND WITH CONTRAST  TECHNIQUE: Multiplanar, multisequence MR imaging was performed both before and after administration of intravenous contrast.  CONTRAST:  29mL MULTIHANCE GADOBENATE DIMEGLUMINE 529 MG/ML IV SOLN  COMPARISON:  None.  FINDINGS: Patient motion degrades image quality limiting  evaluation.  There is a soft tissue laceration involving the fifth toe. The fifth middle and distal phalanx are difficult to assess secondary to motion. There is mild edema within the fifth proximal phalanx without enhancement. There is no definite cortical destruction.  There is moderate osteoarthritis of the fifth MTP joint. There is edema in the medial hallux sesamoid as can be seen with sesamoiditis.  There is no other marrow signal abnormality. There is no fracture or dislocation. There is no fluid collection or hematoma. There is soft tissue swelling along the dorsal lateral aspect of the right forefoot.  IMPRESSION: 1. Soft tissue laceration the involving the fifth toe. The middle and distal fifth phalanx are nondiagnostic to motion. There mild edema within the fifth proximal phalanx without enhancement or cortical destruction likely rib reflecting reactive edema. 2. Mild medial hallux sesamoiditis.   Electronically Signed   By: Kathreen Devoid   On: 05/14/2014 13:32   Nm Myocar Multi W/spect W/wall Motion / Ef  05/22/2014   CLINICAL DATA:  54 year old with preoperative cardiovascular examination.  EXAM: MYOCARDIAL IMAGING WITH SPECT (REST AND PHARMACOLOGIC-STRESS)  GATED LEFT VENTRICULAR WALL MOTION STUDY  LEFT VENTRICULAR EJECTION FRACTION  TECHNIQUE: Standard myocardial SPECT imaging was performed after resting intravenous injection of 10 mCi Tc-32m sestamibi. Subsequently, intravenous infusion of Lexiscan was performed under the supervision of the Cardiology staff. At peak effect of the drug, 30 mCi Tc-51m sestamibi was injected intravenously and standard myocardial SPECT imaging  was performed. Quantitative gated imaging was also performed to evaluate left ventricular wall motion, and estimate left ventricular ejection fraction.  COMPARISON:  None.  FINDINGS: Perfusion: No decreased activity in the left ventricle on stress imaging to suggest reversible ischemia or infarction.  Wall Motion: Normal left  ventricular wall motion. No left ventricular dilation.  Left Ventricular Ejection Fraction: 67 %  End diastolic volume 37 ml  End systolic volume 12 ml  IMPRESSION: 1. No reversible ischemia or infarction.  2. Normal left ventricular wall motion.  3. Left ventricular ejection fraction is 67%.  4. Low-risk stress test findings*.  *2012 Appropriate Use Criteria for Coronary Revascularization Focused Update: J Am Coll Cardiol. N6492421. http://content.airportbarriers.com.aspx?articleid=1201161   Electronically Signed   By: Markus Daft M.D.   On: 05/22/2014 16:30   Dg Abd Acute W/chest  05/09/2014   CLINICAL DATA:  Hypertension.  Diabetes.  EXAM: ACUTE ABDOMEN SERIES (ABDOMEN 2 VIEW & CHEST 1 VIEW)  COMPARISON:  05/06/2014, 01/08/2010  FINDINGS: There is no evidence of dilated bowel loops or free intraperitoneal air. No radiopaque calculi or other significant radiographic abnormality is seen. There is moderate unchanged cardiomegaly. Hilar and mediastinal contours are within normal limits. Both lungs are clear.  IMPRESSION: Negative abdominal radiographs.  No acute cardiopulmonary disease.   Electronically Signed   By: Andreas Newport M.D.   On: 05/09/2014 02:18   Dg Foot 2 Views Right  05/13/2014   CLINICAL DATA:  Cellulitis of fifth toe.  EXAM: RIGHT FOOT - 2 VIEW  COMPARISON:  05/09/2014  FINDINGS: Mild diffuse osteopenia. There is no acute fracture or subluxation identified. There is soft tissue swelling and ulceration overlying the fifth distal phalanx. No underlying bone erosion identified. Osteoarthritis involving the first MTP joint identified.  IMPRESSION: 1. No evidence for osteomyelitis. 2. Soft tissue swelling and ulceration overlies the fifth distal phalanx.   Electronically Signed   By: Kerby Moors M.D.   On: 05/13/2014 17:57   Dg Foot Complete Right  05/09/2014   CLINICAL DATA:  Total pain, medial aspect of the fifth digit  EXAM: RIGHT FOOT COMPLETE - 3+ VIEW  COMPARISON:  None.   FINDINGS: Negative for fracture, dislocation or radiopaque foreign body. There is no bone lesion or bony destruction  IMPRESSION: Negative.   Electronically Signed   By: Andreas Newport M.D.   On: 05/09/2014 02:14   Anti-infectives: Anti-infectives    Start     Dose/Rate Route Frequency Ordered Stop   05/23/14 2000  cefUROXime (ZINACEF) 1.5 g in dextrose 5 % 50 mL IVPB     1.5 g 100 mL/hr over 30 Minutes Intravenous Every 12 hours 05/23/14 1248 05/24/14 0931   05/23/14 1300  [MAR Hold]  cefUROXime (ZINACEF) 1.5 g in dextrose 5 % 50 mL IVPB     (MAR Hold since 05/23/14 0741)   1.5 g 100 mL/hr over 30 Minutes Intravenous On call to O.R. 05/22/14 1721 05/23/14 1000   05/23/14 0945  cefUROXime (ZINACEF) 1.5 g in dextrose 5 % 50 mL IVPB  Status:  Discontinued     1.5 g 100 mL/hr over 30 Minutes Intravenous To Surgery 05/23/14 0945 05/23/14 1241   05/15/14 1715  clindamycin (CLEOCIN) capsule 450 mg  Status:  Discontinued     450 mg Oral 3 times per day 05/15/14 1711 05/22/14 1038   05/15/14 1715  levofloxacin (LEVAQUIN) tablet 750 mg  Status:  Discontinued     750 mg Oral Daily 05/15/14 1711 05/23/14 2103  05/13/14 1700  piperacillin-tazobactam (ZOSYN) IVPB 3.375 g  Status:  Discontinued     3.375 g 12.5 mL/hr over 240 Minutes Intravenous 3 times per day 05/13/14 1628 05/15/14 1711   05/13/14 1700  vancomycin (VANCOCIN) 1,500 mg in sodium chloride 0.9 % 500 mL IVPB  Status:  Discontinued     1,500 mg 250 mL/hr over 120 Minutes Intravenous Every 24 hours 05/13/14 1628 05/15/14 1711   05/13/14 1615  piperacillin-tazobactam (ZOSYN) IVPB 3.375 g  Status:  Discontinued     3.375 g 100 mL/hr over 30 Minutes Intravenous  Once 05/13/14 1613 05/13/14 1628   05/13/14 1615  vancomycin (VANCOCIN) IVPB 1000 mg/200 mL premix  Status:  Discontinued     1,000 mg 200 mL/hr over 60 Minutes Intravenous  Once 05/13/14 1613 05/13/14 1628      Assessment/Plan: s/p Procedure(s): AMPUTATION BELOW KNEE  (Right) Table postop day 1. Will change dressings tomorrow and remove drains tomorrow.   LOS: 10 days   EARLY, TODD 05/24/2014, 10:27 AM

## 2014-05-24 NOTE — Evaluation (Signed)
Occupational Therapy Evaluation Patient Details Name: Kimberly Lane MRN: [id number] DOB: 1960-05-14 Today's Date: 05/24/2014    History of Present Illness 54 y.o. [name] speaking female admitted with PAD, Dry Gangrene of right fifth toe, s/p right BKA 4/1. Hx of T2DM, and HTN.   Clinical Impression   Pt admitted with above. She demonstrates the below listed deficits and will benefit from continued OT to maximize safety and independence with BADLs.  Pt presents to OT with generalized weakness and acute pain.  Currently, she requires max - total A for LB ADLs and mod A +2 for functional mobility.  Pt is very motivated and spouse is very supportive.  She would be an excellent candidate for CIR.  They would also benefit from SW consult as they have multiple questions about finding accessible housing an community resources.   Interpreter was used via [employer] # [id number]      Follow Up Recommendations  CIR;Supervision/Assistance - 24 hour    Equipment Recommendations  3 in 1 bedside comode;Tub/shower bench    Recommendations for Other Services Rehab consult     Precautions / Restrictions Precautions Precautions: Fall Restrictions Weight Bearing Restrictions: Yes RLE Weight Bearing: Weight bearing as tolerated      Mobility Bed Mobility Overal bed mobility: Needs Assistance;+2 for physical assistance Bed Mobility: Supine to Sit     Supine to sit: Min assist;HOB elevated     General bed mobility comments: Min assist for initial trunk support, then tolerated unsupported sitting. Min assist for scoot with bed pad to rotate to edge of bed. Tactile cues for directions.  Transfers Overall transfer level: Needs assistance Equipment used: Rolling walker (2 wheeled) Transfers: Sit to/from [facility] Sit to Stand: Mod assist;+2 physical assistance Stand pivot transfers: Mod assist;+2 physical assistance       General transfer comment: Mod assist for boost  and balance with cues for technique from lowest bed setting, tactile cues for hand placement. Required Rt knee block initially however progressed and was able to pivot with small steps using the RW for support. Mod assist +2 for balance and walker placement. VC for technique via interpreter throughout. No buckling but attempts to sit prematurely requiring additional cues for upright posture.    Balance Overall balance assessment: Needs assistance Sitting-balance support: Feet supported Sitting balance-Leahy Scale: Fair     Standing balance support: Bilateral upper extremity supported Standing balance-Leahy Scale: Zero                              ADL Overall ADL's : Needs assistance/impaired Eating/Feeding: Independent   Grooming: Wash/dry face;Wash/dry hands;Oral care;Brushing hair;Set up;Sitting;Bed level   Upper Body Bathing: Set up;Sitting;Bed level   Lower Body Bathing: Moderate assistance;Bed level;Sit to/from stand   Upper Body Dressing : Minimal assistance;Sitting   Lower Body Dressing: Total assistance;Sit to/from stand   Toilet Transfer: Moderate assistance;+2 for physical assistance;Stand-pivot;RW;BSC   Toileting- Clothing Manipulation and Hygiene: Total assistance;Sit to/from stand       Functional mobility during ADLs: Moderate assistance;+2 for physical assistance;Rolling walker General ADL Comments: Pt moves slowly due to pain.  Spouse present and very supportive     Vision     Perception     Praxis      Pertinent Vitals/Pain Pain Assessment: Faces Faces Pain Scale: Hurts whole lot Pain Location: rt residual limb Pain Intervention(s): Premedicated before session;Monitored during session;Repositioned;RN gave pain meds during session  Hand Dominance     Extremity/Trunk Assessment Upper Extremity Assessment Upper Extremity Assessment: Generalized weakness   Lower Extremity Assessment Lower Extremity Assessment: Defer to PT  evaluation RLE Deficits / Details: Guarded due to surgery. Able to move slowly. Minimal knee extension.       Communication Communication Communication: Prefers language other than English   Cognition Arousal/Alertness: Awake/alert Behavior During Therapy: WFL for tasks assessed/performed Overall Cognitive Status: Difficult to assess                     General Comments       Exercises Exercises: Amputee;General Lower Extremity     Shoulder Instructions      Home Living Family/patient expects to be discharged to:: Private residence Living Arrangements: Spouse/significant other (husband) Available Help at Discharge: Family;Available 24 hours/day (husband is no longer working) Type of Home: Apartment Home Access: Level entry     Home Layout: Two level (Bathroom is upstairs) Alternate Level Stairs-Number of Steps: 10   Bathroom Shower/Tub: Tub/shower unit;Curtain Shower/tub characteristics: Architectural technologist: Standard     Home Equipment: Wheelchair - manual   Additional Comments: Lives in [place]. - Has a bathtub       Prior Functioning/Environment Level of Independence: Needs assistance  Gait / Transfers Assistance Needed: Uses a wheelchair for mobility at home ADL's / Homemaking Assistance Needed: Extended friends and family were assisting with ADLs         OT Diagnosis: Generalized weakness;Acute pain   OT Problem List: Decreased strength;Decreased activity tolerance;Impaired balance (sitting and/or standing);Decreased safety awareness;Decreased knowledge of use of DME or AE;Pain   OT Treatment/Interventions: DME and/or AE instruction;Therapeutic activities;Patient/family education;Balance training;Self-care/ADL training;Therapeutic exercise    OT Goals(Current goals can be found in the care plan section) Acute Rehab OT Goals Patient Stated Goal: to get stronger and Find an accessible home OT Goal Formulation: With patient/family Time For  Goal Achievement: 06/07/14 Potential to Achieve Goals: Good ADL Goals Pt Will Perform Lower Body Bathing: with min assist;sit to/from stand Pt Will Perform Upper Body Dressing: with set-up;sitting Pt Will Perform Lower Body Dressing: with min assist;sit to/from stand Pt Will Transfer to Toilet: with min assist;ambulating;regular height toilet;bedside commode;grab bars Pt Will Perform Toileting - Clothing Manipulation and hygiene: sit to/from stand;with min assist Pt/caregiver will Perform Home Exercise Program: Increased strength;Both right and left upper extremity;With Supervision  OT Frequency: Min 2X/week   Barriers to D/C:            Co-evaluation PT/OT/SLP Co-Evaluation/Treatment: Yes Reason for Co-Treatment: Complexity of the patient's impairments (multi-system involvement) PT goals addressed during session: Mobility/safety with mobility;Balance;Proper use of DME;Strengthening/ROM OT goals addressed during session: ADL's and self-care      End of Session Equipment Utilized During Treatment: Rolling walker Nurse Communication: Mobility status  Activity Tolerance: Patient tolerated treatment well Patient left: in chair;with call bell/phone within reach;with family/visitor present;with nursing/sitter in room   Time: [phone] OT Time Calculation (min): 60 min Charges:  OT General Charges $OT Visit: 1 Procedure OT Evaluation $Initial OT Evaluation Tier I: 1 Procedure OT Treatments $Therapeutic Activity: 8-22 mins G-Codes:    Brennon Otterness M June 16, 2014, 11:40 AM

## 2014-05-25 LAB — URINALYSIS, ROUTINE W REFLEX MICROSCOPIC
Bilirubin Urine: NEGATIVE
Glucose, UA: NEGATIVE mg/dL
Hgb urine dipstick: NEGATIVE
KETONES UR: 15 mg/dL — AB
Leukocytes, UA: NEGATIVE
NITRITE: NEGATIVE
Protein, ur: NEGATIVE mg/dL
Specific Gravity, Urine: 1.017 (ref 1.005–1.030)
Urobilinogen, UA: 0.2 mg/dL (ref 0.0–1.0)
pH: 5.5 (ref 5.0–8.0)

## 2014-05-25 LAB — BASIC METABOLIC PANEL
ANION GAP: 9 (ref 5–15)
BUN: <5 mg/dL — ABNORMAL LOW (ref 6–23)
CALCIUM: 8.6 mg/dL (ref 8.4–10.5)
CHLORIDE: 97 mmol/L (ref 96–112)
CO2: 29 mmol/L (ref 19–32)
CREATININE: 0.5 mg/dL (ref 0.50–1.10)
GFR calc Af Amer: >90 mL/min (ref >90)
GFR calc non Af Amer: >90 mL/min (ref >90)
Glucose, Bld: 218 mg/dL — ABNORMAL HIGH (ref 70–99)
POTASSIUM: 3.3 mmol/L — AB (ref 3.5–5.1)
Sodium: 135 mmol/L (ref 135–145)

## 2014-05-25 LAB — CBC
HEMATOCRIT: 27.4 % — AB (ref 36.0–46.0)
HEMOGLOBIN: 8.7 g/dL — AB (ref 12.0–15.0)
MCH: 25.4 pg — ABNORMAL LOW (ref 26.0–34.0)
MCHC: 31.8 g/dL (ref 30.0–36.0)
MCV: 79.9 fL (ref 78.0–100.0)
PLATELETS: 412 10*3/uL — AB (ref 150–400)
RBC: 3.43 MIL/uL — ABNORMAL LOW (ref 3.87–5.11)
RDW: 13.5 % (ref 11.5–15.5)
WBC: 12.3 10*3/uL — ABNORMAL HIGH (ref 4.0–10.5)

## 2014-05-25 LAB — GLUCOSE, CAPILLARY
GLUCOSE-CAPILLARY: 215 mg/dL — AB (ref 70–99)
GLUCOSE-CAPILLARY: 182 mg/dL — AB (ref 70–99)
GLUCOSE-CAPILLARY: 174 mg/dL — AB (ref 70–99)
Glucose-Capillary: 160 mg/dL — ABNORMAL HIGH (ref 70–99)

## 2014-05-25 MED ORDER — OXYCODONE HCL 5 MG PO TABS
5.0000 mg | ORAL_TABLET | ORAL | Status: DC | PRN
  Administered 2014-05-25: 5 mg via ORAL
  Filled 2014-05-25: qty 1

## 2014-05-25 MED ORDER — DOCUSATE SODIUM 100 MG PO CAPS
100.0000 mg | ORAL_CAPSULE | Freq: Two times a day (BID) | ORAL | Status: DC
Start: 2014-05-25 — End: 2014-05-26
  Administered 2014-05-25 (×2): 100 mg via ORAL
  Filled 2014-05-25 (×3): qty 1

## 2014-05-25 MED ORDER — POLYETHYLENE GLYCOL 3350 17 G PO PACK
17.0000 g | PACK | Freq: Every day | ORAL | Status: DC | PRN
  Filled 2014-05-25: qty 2

## 2014-05-25 MED ORDER — MORPHINE SULFATE 4 MG/ML IJ SOLN
4.0000 mg | INTRAMUSCULAR | Status: DC | PRN
  Administered 2014-05-25 – 2014-05-26 (×3): 4 mg via INTRAVENOUS
  Filled 2014-05-25 (×4): qty 1

## 2014-05-25 MED ORDER — POLYETHYLENE GLYCOL 3350 17 G PO PACK
17.0000 g | PACK | Freq: Every day | ORAL | Status: DC
  Administered 2014-05-25: 17 g via ORAL
  Filled 2014-05-25 (×2): qty 1

## 2014-05-25 MED ORDER — INSULIN GLARGINE 100 UNIT/ML ~~LOC~~ SOLN
20.0000 [IU] | Freq: Every day | SUBCUTANEOUS | Status: DC
  Administered 2014-05-26: 20 [IU] via SUBCUTANEOUS
  Filled 2014-05-25: qty 0.2

## 2014-05-25 MED ORDER — OXYCODONE HCL 5 MG PO TABS
5.0000 mg | ORAL_TABLET | Freq: Four times a day (QID) | ORAL | Status: DC
  Administered 2014-05-25 – 2014-05-26 (×4): 5 mg via ORAL
  Filled 2014-05-25 (×5): qty 1

## 2014-05-25 NOTE — Progress Notes (Signed)
RN Bladder scanned pt; 651ml in bladder. Pt was placed on bedpan to urinate but was unable to do so. M.D paged to recieve order to perform I/O cath.  Order received for I/O Cath. I/O performed 550cc clear yellow urine collected. Pt resting in bed at lowest position with call bell within reach. Will cont to monitor

## 2014-05-25 NOTE — Progress Notes (Addendum)
Family Medicine Teaching Service Daily Progress Note Intern Pager: [phone]  Patient name: Kimberly Lane Medical record number: [id number] Date of birth: [birth year] Age: 54 y.o. Gender: female  Primary Care Provider: Marina Goodell, MD Consultants: None Code Status: Full  Pt Overview and Major Events to Date:  3/22- Admitted with infection of RLE; Vanc/zosyn started 3/24- Vascular consulted for PAD and concern for gangrene 4/1 - Patient underwent right BKA  ABX/Culture Vanc 3/22 >> 3/24 Zosyn 3/22 >> 3/24 Levaquin 3/24 >> 4/1 Clinda 3/24 >> 3/31 Cefuroxime 4/1 > 4/2 (Post-BKA protocol)  Blood cx 3/22 >> NG Final.   Assessment and Plan: Kimberly Lane is a 54 y.o. female presenting with foot infection / nonhealing wound 2/2 blunt traumatic injury to right 5th digit. PMH is significant for DM2, HTN, hx of primary TB and treated, HLD.   PAD, Dry Gangrene of right fifth toe, s/p right BKA 4/1: ABIs ~0.4. Complicated by uncontrolled diabetes.  - Reactive leukocytosis noted, will continue to monitor - Continue ASA and statin.  - PT and OT consulted, inpatient rehab also following. - Pain control: Scheduled oxycodone, oxycodone prn, morphine IV prn - Bowel regimen: Colace, miralax - Post-op f/u May 3rd at 8:30 with dr. Kellie Simmering - Social work consulted for placement: SNF vs CIR  T2DM: A1c 9.6. On Amaryl and Metformin at home with reported compliance. Will likely require insulin at home. Currently holding home PO meds. Has been on insulin in the past. - Lantus at 20u and continue moderate SSI AC/HS.  - Will need glucose meter strips and lancets at discharge  HTN:  - Continue amlodipine 10mg  and lisinopril 20mg  - Labetalol prn  High risk social situation: Patient is a [name] refugee and speaks a dialect of Arabic, she has little social support though she has apparently been able to obtains meds through the [facility] program. Her husband is currently in [place] and has no other family in the  [place].  - Will likely be discharged to CIR vs SNF  Normochromic anemia: Mild, prior to admission baseline 12-13. Stable post-op. - Continue to monitor  FEN/GI: Carb-modified/heart-healthy diet, saline lock Prophylaxis: heparin SubQ   Disposition: Medically stable. Anticipate discharge to CIR vs SNF.   Subjective:   Pain not well controlled this morning. Also complaining of intermittent nausea. Has not had bowel movement in 2 days.   Objective: Temp:  [98.1 F (36.7 C)-99.1 F (37.3 C)] 99.1 F (37.3 C) (04/03 0900) Pulse Rate:  [88-99] 88 (04/03 0900) Resp:  [16-20] 20 (04/03 0900) BP: (130-148)/(53-63) 144/62 mmHg (04/03 0900) SpO2:  [95 %-100 %] 100 % (04/03 0900) Physical Exam: General: Female sitting up in bed eating breakfast with husband in no distress Cardiovascular: RRR, systolic 2/6 ejection murmur at RUSB Respiratory: CTAB, appropriate rate, unlabored.  Abdomen: S, NT, No guarding, no rebound or peritoneal signs, ND, +BS Extremities: s/p right BKA. Dressing in place, dry and intact without notable tracking of erythema Neuro: alert and interactive. No focal deficits.    Laboratory:  Recent Labs Lab 05/23/14 0400 05/24/14 0455 05/25/14 0503  WBC 10.4 15.0* 12.3*  HGB 10.2* 9.2* 8.7*  HCT 30.9* 28.3* 27.4*  PLT 400 421* 412*    Recent Labs Lab 05/23/14 0400 05/24/14 0455 05/25/14 0503  NA 139 134* 135  K 3.6 3.3* 3.3*  CL 100 98 97  CO2 29 31 29   BUN 6 <5* <5*  CREATININE 0.55 0.52 0.50  CALCIUM 9.2 8.7 8.6  GLUCOSE 141* 283* 218*  Recent Labs Lab 05/24/14 0626 05/24/14 1141 05/24/14 1617 05/24/14 2118 05/25/14 0602  GLUCAP 249* 394* 286* 200* 215*     Vivi Barrack, MD 05/25/2014, 11:02 AM PGY-1, [place] Intern pager: [phone], text pages welcome

## 2014-05-25 NOTE — Progress Notes (Signed)
Subjective: Interval History: none.. Seems to have less pain in her  Objective: Vital signs in last 24 hours: Temp:  [98.1 F (36.7 C)-99.1 F (37.3 C)] 99.1 F (37.3 C) (04/03 0900) Pulse Rate:  [88-99] 88 (04/03 0900) Resp:  [16-20] 20 (04/03 0900) BP: (130-148)/(53-63) 144/62 mmHg (04/03 0900) SpO2:  [95 %-100 %] 100 % (04/03 0900)  Intake/Output from previous day: 04/02 0701 - 04/03 0700 In: 240 [P.O.:240] Out: 570 [Urine:550; Drains:20] Intake/Output this shift:    Dressing removed with suture line looks quite good. Does have knee contracture.  Lab Results:  Recent Labs  05/24/14 0455 05/25/14 0503  WBC 15.0* 12.3*  HGB 9.2* 8.7*  HCT 28.3* 27.4*  PLT 421* 412*   BMET  Recent Labs  05/24/14 0455 05/25/14 0503  NA 134* 135  K 3.3* 3.3*  CL 98 97  CO2 31 29  GLUCOSE 283* 218*  BUN <5* <5*  CREATININE 0.52 0.50  CALCIUM 8.7 8.6    Studies/Results: Dg Chest 2 View  05/06/2014   CLINICAL DATA:  Cough and fever for several days  EXAM: CHEST  2 VIEW  COMPARISON:  May 04, 2010  FINDINGS: There is no edema or consolidation. Heart is upper normal in size with pulmonary vascularity within normal limits. No adenopathy. There is degenerative change in the lower thoracic spine.  IMPRESSION: No edema or consolidation.   Electronically Signed   By: Lowella Grip III M.D.   On: 05/06/2014 16:37   Mr Toes Right Wo/w Cm  05/14/2014   CLINICAL DATA:  Osteomyelitis, history of diabetes, cellulitis of the right foot  EXAM: MRI OF THE RIGHT TOES WITHOUT AND WITH CONTRAST  TECHNIQUE: Multiplanar, multisequence MR imaging was performed both before and after administration of intravenous contrast.  CONTRAST:  74mL MULTIHANCE GADOBENATE DIMEGLUMINE 529 MG/ML IV SOLN  COMPARISON:  None.  FINDINGS: Patient motion degrades image quality limiting evaluation.  There is a soft tissue laceration involving the fifth toe. The fifth middle and distal phalanx are difficult to assess  secondary to motion. There is mild edema within the fifth proximal phalanx without enhancement. There is no definite cortical destruction.  There is moderate osteoarthritis of the fifth MTP joint. There is edema in the medial hallux sesamoid as can be seen with sesamoiditis.  There is no other marrow signal abnormality. There is no fracture or dislocation. There is no fluid collection or hematoma. There is soft tissue swelling along the dorsal lateral aspect of the right forefoot.  IMPRESSION: 1. Soft tissue laceration the involving the fifth toe. The middle and distal fifth phalanx are nondiagnostic to motion. There mild edema within the fifth proximal phalanx without enhancement or cortical destruction likely rib reflecting reactive edema. 2. Mild medial hallux sesamoiditis.   Electronically Signed   By: Kathreen Devoid   On: 05/14/2014 13:32   Nm Myocar Multi W/spect W/wall Motion / Ef  05/22/2014   CLINICAL DATA:  54 year old with preoperative cardiovascular examination.  EXAM: MYOCARDIAL IMAGING WITH SPECT (REST AND PHARMACOLOGIC-STRESS)  GATED LEFT VENTRICULAR WALL MOTION STUDY  LEFT VENTRICULAR EJECTION FRACTION  TECHNIQUE: Standard myocardial SPECT imaging was performed after resting intravenous injection of 10 mCi Tc-33m sestamibi. Subsequently, intravenous infusion of Lexiscan was performed under the supervision of the Cardiology staff. At peak effect of the drug, 30 mCi Tc-36m sestamibi was injected intravenously and standard myocardial SPECT imaging was performed. Quantitative gated imaging was also performed to evaluate left ventricular wall motion, and estimate left ventricular ejection fraction.  COMPARISON:  None.  FINDINGS: Perfusion: No decreased activity in the left ventricle on stress imaging to suggest reversible ischemia or infarction.  Wall Motion: Normal left ventricular wall motion. No left ventricular dilation.  Left Ventricular Ejection Fraction: 67 %  End diastolic volume 37 ml  End  systolic volume 12 ml  IMPRESSION: 1. No reversible ischemia or infarction.  2. Normal left ventricular wall motion.  3. Left ventricular ejection fraction is 67%.  4. Low-risk stress test findings*.  *2012 Appropriate Use Criteria for Coronary Revascularization Focused Update: J Am Coll Cardiol. [id number]. http://content.airportbarriers.com.aspx?articleid=1201161   Electronically Signed   By: Markus Daft M.D.   On: 05/22/2014 16:30   Dg Abd Acute W/chest  05/09/2014   CLINICAL DATA:  Hypertension.  Diabetes.  EXAM: ACUTE ABDOMEN SERIES (ABDOMEN 2 VIEW & CHEST 1 VIEW)  COMPARISON:  05/06/2014, 01/08/2010  FINDINGS: There is no evidence of dilated bowel loops or free intraperitoneal air. No radiopaque calculi or other significant radiographic abnormality is seen. There is moderate unchanged cardiomegaly. Hilar and mediastinal contours are within normal limits. Both lungs are clear.  IMPRESSION: Negative abdominal radiographs.  No acute cardiopulmonary disease.   Electronically Signed   By: Andreas Newport M.D.   On: 05/09/2014 02:18   Dg Foot 2 Views Right  05/13/2014   CLINICAL DATA:  Cellulitis of fifth toe.  EXAM: RIGHT FOOT - 2 VIEW  COMPARISON:  05/09/2014  FINDINGS: Mild diffuse osteopenia. There is no acute fracture or subluxation identified. There is soft tissue swelling and ulceration overlying the fifth distal phalanx. No underlying bone erosion identified. Osteoarthritis involving the first MTP joint identified.  IMPRESSION: 1. No evidence for osteomyelitis. 2. Soft tissue swelling and ulceration overlies the fifth distal phalanx.   Electronically Signed   By: Kerby Moors M.D.   On: 05/13/2014 17:57   Dg Foot Complete Right  05/09/2014   CLINICAL DATA:  Total pain, medial aspect of the fifth digit  EXAM: RIGHT FOOT COMPLETE - 3+ VIEW  COMPARISON:  None.  FINDINGS: Negative for fracture, dislocation or radiopaque foreign body. There is no bone lesion or bony destruction  IMPRESSION:  Negative.   Electronically Signed   By: Andreas Newport M.D.   On: 05/09/2014 02:14   Anti-infectives: Anti-infectives    Start     Dose/Rate Route Frequency Ordered Stop   05/23/14 2000  cefUROXime (ZINACEF) 1.5 g in dextrose 5 % 50 mL IVPB     1.5 g 100 mL/hr over 30 Minutes Intravenous Every 12 hours 05/23/14 1248 05/24/14 0931   05/23/14 1300  [MAR Hold]  cefUROXime (ZINACEF) 1.5 g in dextrose 5 % 50 mL IVPB     (MAR Hold since 05/23/14 0741)   1.5 g 100 mL/hr over 30 Minutes Intravenous On call to O.R. 05/22/14 1721 05/23/14 1000   05/23/14 0945  cefUROXime (ZINACEF) 1.5 g in dextrose 5 % 50 mL IVPB  Status:  Discontinued     1.5 g 100 mL/hr over 30 Minutes Intravenous To Surgery 05/23/14 0945 05/23/14 1241   05/15/14 1715  clindamycin (CLEOCIN) capsule 450 mg  Status:  Discontinued     450 mg Oral 3 times per day 05/15/14 1711 05/22/14 1038   05/15/14 1715  levofloxacin (LEVAQUIN) tablet 750 mg  Status:  Discontinued     750 mg Oral Daily 05/15/14 1711 05/23/14 2103   05/13/14 1700  piperacillin-tazobactam (ZOSYN) IVPB 3.375 g  Status:  Discontinued     3.375 g 12.5 mL/hr over  240 Minutes Intravenous 3 times per day 05/13/14 1628 05/15/14 1711   05/13/14 1700  vancomycin (VANCOCIN) 1,500 mg in sodium chloride 0.9 % 500 mL IVPB  Status:  Discontinued     1,500 mg 250 mL/hr over 120 Minutes Intravenous Every 24 hours 05/13/14 1628 05/15/14 1711   05/13/14 1615  piperacillin-tazobactam (ZOSYN) IVPB 3.375 g  Status:  Discontinued     3.375 g 100 mL/hr over 30 Minutes Intravenous  Once 05/13/14 1613 05/13/14 1628   05/13/14 1615  vancomycin (VANCOCIN) IVPB 1000 mg/200 mL premix  Status:  Discontinued     1,000 mg 200 mL/hr over 60 Minutes Intravenous  Once 05/13/14 1613 05/13/14 1628      Assessment/Plan: s/p Procedure(s): AMPUTATION BELOW KNEE (Right) Stable overall. Will DC drains.   LOS: 11 days   Kimberly Lane 05/25/2014, 11:24 AM

## 2014-05-25 NOTE — Progress Notes (Addendum)
MD notified about bladder scan results, would like to do in & out once and send UA to lab. Kathleen Argue S 1:08 PM   Pt and family request I come back at 1330. 1:12 PM

## 2014-05-25 NOTE — Progress Notes (Signed)
bilat leg drains removed. 11:31 AM

## 2014-05-25 NOTE — Progress Notes (Signed)
MD notified, pt vomit up meds and c/o nausea and unable to eat, MD aware and will place orders. Kimberly Lane   10:53 AM

## 2014-05-26 ENCOUNTER — Inpatient Hospital Stay (HOSPITAL_COMMUNITY)
Admission: RE | Admit: 2014-05-26 | Discharge: 2014-06-03 | DRG: 945 | Disposition: A | Discharge status: Home Health | Payer: Medicaid Other | Source: Intra-hospital | Attending: Physical Medicine & Rehabilitation | Admitting: Physical Medicine & Rehabilitation

## 2014-05-26 ENCOUNTER — Encounter (HOSPITAL_COMMUNITY): Payer: Self-pay | Admitting: Vascular Surgery

## 2014-05-26 DIAGNOSIS — M25511 Pain in right shoulder: Secondary | ICD-10-CM

## 2014-05-26 DIAGNOSIS — E1142 Type 2 diabetes mellitus with diabetic polyneuropathy: Secondary | ICD-10-CM | POA: Diagnosis present

## 2014-05-26 DIAGNOSIS — D62 Acute posthemorrhagic anemia: Secondary | ICD-10-CM | POA: Diagnosis present

## 2014-05-26 DIAGNOSIS — R059 Cough, unspecified: Secondary | ICD-10-CM | POA: Insufficient documentation

## 2014-05-26 DIAGNOSIS — D72829 Elevated white blood cell count, unspecified: Secondary | ICD-10-CM | POA: Diagnosis present

## 2014-05-26 DIAGNOSIS — R531 Weakness: Principal | ICD-10-CM | POA: Diagnosis present

## 2014-05-26 DIAGNOSIS — R339 Retention of urine, unspecified: Secondary | ICD-10-CM | POA: Diagnosis present

## 2014-05-26 DIAGNOSIS — I70201 Unspecified atherosclerosis of native arteries of extremities, right leg: Secondary | ICD-10-CM | POA: Diagnosis present

## 2014-05-26 DIAGNOSIS — E785 Hyperlipidemia, unspecified: Secondary | ICD-10-CM | POA: Diagnosis present

## 2014-05-26 DIAGNOSIS — L03031 Cellulitis of right toe: Secondary | ICD-10-CM

## 2014-05-26 DIAGNOSIS — R05 Cough: Secondary | ICD-10-CM

## 2014-05-26 DIAGNOSIS — I10 Essential (primary) hypertension: Secondary | ICD-10-CM | POA: Diagnosis present

## 2014-05-26 DIAGNOSIS — K5900 Constipation, unspecified: Secondary | ICD-10-CM | POA: Diagnosis present

## 2014-05-26 DIAGNOSIS — Z89511 Acquired absence of right leg below knee: Secondary | ICD-10-CM

## 2014-05-26 LAB — CBC
HCT: 27.2 % — ABNORMAL LOW (ref 36.0–46.0)
Hemoglobin: 8.7 g/dL — ABNORMAL LOW (ref 12.0–15.0)
MCH: 25.5 pg — AB (ref 26.0–34.0)
MCHC: 32 g/dL (ref 30.0–36.0)
MCV: 79.8 fL (ref 78.0–100.0)
PLATELETS: 414 10*3/uL — AB (ref 150–400)
RBC: 3.41 MIL/uL — AB (ref 3.87–5.11)
RDW: 13.4 % (ref 11.5–15.5)
WBC: 12.5 10*3/uL — ABNORMAL HIGH (ref 4.0–10.5)

## 2014-05-26 LAB — BASIC METABOLIC PANEL
ANION GAP: 11 (ref 5–15)
BUN: <5 mg/dL — ABNORMAL LOW (ref 6–23)
CALCIUM: 8.6 mg/dL (ref 8.4–10.5)
CO2: 28 mmol/L (ref 19–32)
CREATININE: 0.53 mg/dL (ref 0.50–1.10)
Chloride: 97 mmol/L (ref 96–112)
GFR calc Af Amer: >90 mL/min (ref >90)
Glucose, Bld: 178 mg/dL — ABNORMAL HIGH (ref 70–99)
Potassium: 3.5 mmol/L (ref 3.5–5.1)
SODIUM: 136 mmol/L (ref 135–145)

## 2014-05-26 LAB — GLUCOSE, CAPILLARY
GLUCOSE-CAPILLARY: 273 mg/dL — AB (ref 70–99)
GLUCOSE-CAPILLARY: 297 mg/dL — AB (ref 70–99)
Glucose-Capillary: 199 mg/dL — ABNORMAL HIGH (ref 70–99)

## 2014-05-26 MED ORDER — LISINOPRIL 20 MG PO TABS: 20.0000 mg | ORAL_TABLET | Freq: Every day | ORAL | Status: DC

## 2014-05-26 MED ORDER — ATORVASTATIN CALCIUM 40 MG PO TABS
40.0000 mg | ORAL_TABLET | Freq: Every day | ORAL | Status: DC
  Administered 2014-05-27 – 2014-06-03 (×8): 40 mg via ORAL
  Filled 2014-05-26 (×9): qty 1

## 2014-05-26 MED ORDER — LISINOPRIL 20 MG PO TABS
20.0000 mg | ORAL_TABLET | Freq: Every day | ORAL | Status: DC
  Administered 2014-05-27 – 2014-06-03 (×8): 20 mg via ORAL
  Filled 2014-05-26 (×9): qty 1

## 2014-05-26 MED ORDER — PANTOPRAZOLE SODIUM 40 MG PO TBEC
40.0000 mg | DELAYED_RELEASE_TABLET | Freq: Every day | ORAL | Status: DC
  Administered 2014-05-27 – 2014-06-03 (×8): 40 mg via ORAL
  Filled 2014-05-26 (×8): qty 1

## 2014-05-26 MED ORDER — GUAIFENESIN-DM 100-10 MG/5ML PO SYRP
15.0000 mL | ORAL_SOLUTION | ORAL | Status: DC | PRN
  Administered 2014-05-28: 15 mL via ORAL
  Filled 2014-05-26: qty 15

## 2014-05-26 MED ORDER — DOCUSATE SODIUM 100 MG PO CAPS: 100.0000 mg | ORAL_CAPSULE | Freq: Two times a day (BID) | ORAL | Status: DC

## 2014-05-26 MED ORDER — ACETAMINOPHEN 325 MG PO TABS
650.0000 mg | ORAL_TABLET | Freq: Four times a day (QID) | ORAL | Status: DC | PRN
  Administered 2014-05-26 – 2014-05-27 (×3): 650 mg via ORAL
  Filled 2014-05-26 (×3): qty 2

## 2014-05-26 MED ORDER — OXYCODONE HCL 5 MG PO TABS
5.0000 mg | ORAL_TABLET | ORAL | Status: DC | PRN
  Administered 2014-05-27 – 2014-06-02 (×13): 5 mg via ORAL
  Filled 2014-05-26 (×13): qty 1

## 2014-05-26 MED ORDER — INSULIN ASPART 100 UNIT/ML ~~LOC~~ SOLN: 0.0000 [IU] | Freq: Every day | SUBCUTANEOUS | Status: DC

## 2014-05-26 MED ORDER — INSULIN ASPART 100 UNIT/ML ~~LOC~~ SOLN: 0.0000 [IU] | Freq: Three times a day (TID) | SUBCUTANEOUS | Status: DC

## 2014-05-26 MED ORDER — POLYETHYLENE GLYCOL 3350 17 G PO PACK
17.0000 g | PACK | Freq: Every day | ORAL | Status: DC
Start: 2014-05-27 — End: 2014-06-03
  Administered 2014-05-27 – 2014-06-03 (×8): 17 g via ORAL
  Filled 2014-05-26 (×9): qty 1

## 2014-05-26 MED ORDER — BACID PO TABS
2.0000 | ORAL_TABLET | Freq: Three times a day (TID) | ORAL | Status: DC
  Administered 2014-05-26 – 2014-06-03 (×23): 2 via ORAL
  Filled 2014-05-26 (×26): qty 2

## 2014-05-26 MED ORDER — HEPARIN SODIUM (PORCINE) 5000 UNIT/ML IJ SOLN: 5000.0000 [IU] | Freq: Three times a day (TID) | INTRAMUSCULAR | Status: DC

## 2014-05-26 MED ORDER — LISINOPRIL 20 MG PO TABS
20.0000 mg | ORAL_TABLET | Freq: Every day | ORAL | Status: DC
  Filled 2014-05-26: qty 1

## 2014-05-26 MED ORDER — INSULIN GLARGINE 100 UNIT/ML ~~LOC~~ SOLN
20.0000 [IU] | Freq: Every day | SUBCUTANEOUS | Status: DC
  Administered 2014-05-27 – 2014-06-02 (×7): 20 [IU] via SUBCUTANEOUS
  Filled 2014-05-26 (×8): qty 0.2

## 2014-05-26 MED ORDER — ONDANSETRON HCL 4 MG/2ML IJ SOLN: 4.0000 mg | Freq: Four times a day (QID) | INTRAMUSCULAR | Status: DC | PRN

## 2014-05-26 MED ORDER — HEPARIN SODIUM (PORCINE) 5000 UNIT/ML IJ SOLN
5000.0000 [IU] | Freq: Three times a day (TID) | INTRAMUSCULAR | Status: DC
  Administered 2014-05-26 – 2014-06-03 (×23): 5000 [IU] via SUBCUTANEOUS
  Filled 2014-05-26 (×26): qty 1

## 2014-05-26 MED ORDER — POLYETHYLENE GLYCOL 3350 17 G PO PACK: 17.0000 g | PACK | Freq: Every day | ORAL | Status: DC | PRN

## 2014-05-26 MED ORDER — ALUM & MAG HYDROXIDE-SIMETH 200-200-20 MG/5ML PO SUSP
15.0000 mL | ORAL | Status: DC | PRN
Start: 2014-05-26 — End: 2014-06-03

## 2014-05-26 MED ORDER — ACETAMINOPHEN 650 MG RE SUPP: 650.0000 mg | Freq: Four times a day (QID) | RECTAL | Status: DC | PRN

## 2014-05-26 MED ORDER — DOCUSATE SODIUM 100 MG PO CAPS
100.0000 mg | ORAL_CAPSULE | Freq: Two times a day (BID) | ORAL | Status: DC
  Administered 2014-05-26 – 2014-06-03 (×16): 100 mg via ORAL
  Filled 2014-05-26 (×18): qty 1

## 2014-05-26 MED ORDER — AMLODIPINE BESYLATE 10 MG PO TABS: 10.0000 mg | ORAL_TABLET | Freq: Every day | ORAL | Status: DC

## 2014-05-26 MED ORDER — ONDANSETRON HCL 4 MG PO TABS: 4.0000 mg | ORAL_TABLET | Freq: Four times a day (QID) | ORAL | Status: DC | PRN

## 2014-05-26 MED ORDER — INSULIN ASPART 100 UNIT/ML ~~LOC~~ SOLN
0.0000 [IU] | Freq: Three times a day (TID) | SUBCUTANEOUS | Status: DC
  Administered 2014-05-27: 8 [IU] via SUBCUTANEOUS
  Administered 2014-05-27 – 2014-05-28 (×3): 3 [IU] via SUBCUTANEOUS
  Administered 2014-05-28: 5 [IU] via SUBCUTANEOUS
  Administered 2014-05-28: 8 [IU] via SUBCUTANEOUS
  Administered 2014-05-29: 2 [IU] via SUBCUTANEOUS
  Administered 2014-05-29 (×2): 5 [IU] via SUBCUTANEOUS
  Administered 2014-05-30: 3 [IU] via SUBCUTANEOUS
  Administered 2014-05-30: 8 [IU] via SUBCUTANEOUS
  Administered 2014-05-30: 5 [IU] via SUBCUTANEOUS
  Administered 2014-05-31: 3 [IU] via SUBCUTANEOUS
  Administered 2014-05-31: 15 [IU] via SUBCUTANEOUS
  Administered 2014-05-31: 2 [IU] via SUBCUTANEOUS
  Administered 2014-06-01 (×2): 3 [IU] via SUBCUTANEOUS
  Administered 2014-06-01 – 2014-06-02 (×3): 5 [IU] via SUBCUTANEOUS
  Administered 2014-06-02 – 2014-06-03 (×2): 3 [IU] via SUBCUTANEOUS
  Administered 2014-06-03: 5 [IU] via SUBCUTANEOUS

## 2014-05-26 MED ORDER — AMLODIPINE BESYLATE 10 MG PO TABS
10.0000 mg | ORAL_TABLET | Freq: Every day | ORAL | Status: DC
  Administered 2014-05-27 – 2014-06-03 (×8): 10 mg via ORAL
  Filled 2014-05-26 (×10): qty 1

## 2014-05-26 MED ORDER — PHENOL 1.4 % MT LIQD
1.0000 | OROMUCOSAL | Status: DC | PRN
  Filled 2014-05-26: qty 177

## 2014-05-26 MED ORDER — SORBITOL 70 % SOLN: 30.0000 mL | Freq: Every day | Status: DC | PRN

## 2014-05-26 MED ORDER — ASPIRIN EC 81 MG PO TBEC
81.0000 mg | DELAYED_RELEASE_TABLET | Freq: Every day | ORAL | Status: DC
Start: 2014-05-27 — End: 2014-06-03
  Administered 2014-05-27 – 2014-06-02 (×7): 81 mg via ORAL
  Filled 2014-05-26 (×9): qty 1

## 2014-05-26 MED ORDER — ATORVASTATIN CALCIUM 40 MG PO TABS: 40.0000 mg | ORAL_TABLET | Freq: Every day | ORAL | Status: DC

## 2014-05-26 MED ORDER — INSULIN GLARGINE 100 UNIT/ML ~~LOC~~ SOLN: 20.0000 [IU] | Freq: Every day | SUBCUTANEOUS | Status: DC

## 2014-05-26 NOTE — Progress Notes (Signed)
Physical Therapy Treatment Patient Details Name: Kimberly Lane MRN: [id number] DOB: January 25, 1961 Today's Date: 05/26/2014    History of Present Illness 54 y.o. [name] speaking female admitted with PAD, Dry Gangrene of right fifth toe, s/p right BKA 4/1. Hx of T2DM, and HTN.    PT Comments    Pt very hesitant to move RLE and with all exercises moves very slowly. Sit to stand and static standing with RW better today; stand-pivot with RW +2 max assist due to difficulty unweighting LLE to step/hop.    Follow Up Recommendations  CIR     Equipment Recommendations   (TBD at next venue of care)    Recommendations for Other Services       Precautions / Restrictions Precautions Precautions: Fall    Mobility  Bed Mobility Overal bed mobility: Needs Assistance;+2 for physical assistance Bed Mobility: Supine to Sit     Supine to sit: HOB elevated;Mod assist     General bed mobility comments: Pt preferred husband assist her (although ultimately needed PT for sequencing/technique); pt with difficulty pivoting to EOB  Transfers Overall transfer level: Needs assistance Equipment used: Rolling walker (2 wheeled) Transfers: Sit to/from [facility] Sit to Stand: Mod assist;+2 physical assistance Stand pivot transfers: +2 physical assistance;Max assist       General transfer comment: Stood twice with RW requiring significant boost during first 1/2 and then pt able to contribute much more to upright standing. During pivot pt unable to fully unweight LLE to "hop" and pushing RW too far away from her; assist to maintain balance, complete turning (pt attempting to sit prematurely)  Ambulation/Gait                 Stairs            Wheelchair Mobility    Modified Rankin (Stroke Patients Only)       Balance Overall balance assessment: Needs assistance Sitting-balance support: Bilateral upper extremity supported;Feet supported Sitting balance-Leahy Scale:  Poor (reported dizziness for several minutes and then improved)     Standing balance support: Bilateral upper extremity supported Standing balance-Leahy Scale: Poor                      Cognition Arousal/Alertness: Awake/alert Behavior During Therapy: WFL for tasks assessed/performed Overall Cognitive Status: Within Functional Limits for tasks assessed                      Exercises General Exercises - Lower Extremity Quad Sets: AROM;Right;5 reps Long Arc Quad: Seated;AROM;Right;5 reps Hip Flexion/Marching: AAROM;Right;5 reps;Supine (hip and knee flexion) Amputee Exercises Quad Sets: Right;10 reps;Seated;AAROM    General Comments        Pertinent Vitals/Pain Pain Assessment: Faces (pt unable to give # via interpreter) Faces Pain Scale: Hurts even more ("I was not able to sleep it was so bad") Pain Location: Rt leg Pain Intervention(s): Monitored during session;Limited activity within patient's tolerance;Repositioned    Home Living                      Prior Function            PT Goals (current goals can now be found in the care plan section) Acute Rehab PT Goals Patient Stated Goal: Find an accessible home Time For Goal Achievement: 06/07/14 Progress towards PT goals: Progressing toward goals    Frequency  Min 3X/week    PT Plan Current plan remains appropriate  Co-evaluation             End of Session Equipment Utilized During Treatment: Gait belt (phone interpreter) Activity Tolerance: Patient limited by pain Patient left: in chair;with call bell/phone within reach;with family/visitor present;with nursing/sitter in room     Time: [id number] PT Time Calculation (min) (ACUTE ONLY): 35 min  Charges:  $Therapeutic Exercise: 8-22 mins $Therapeutic Activity: 8-22 mins                    G Codes:      Gene Glazebrook 06/08/14, 1:15 PM Pager [phone]

## 2014-05-26 NOTE — Consult Note (Signed)
Physical Medicine and Rehabilitation Consult Reason for Consult: Right BKA Referring Physician: [facility]   HPI: Kimberly Lane is a 54 y.o. right handed limited English speaking refugee female with history of hypertension, diabetes mellitus with peripheral neuropathy. Presented 05/20/2014 with right fifth toe wound that occurred approximately 2 weeks ago after she hit her toe against a chair at home. Patient lives with her husband had been using a wheelchair recently due to right foot wound. The pain has progressively worsened with cellulitic changes. X-rays show no evidence of osteomyelitis. Noted soft tissue swelling. MRI of the right foot again showed soft tissue laceration involving the fifth toe. Patient with progressive ischemic changes. 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 Limb was not felt to be salvageable and underwent right BKA 05/23/2014 per Dr. Kellie Simmering. Hospital course pain management. Acute blood loss anemia 8.7 and monitored. Bouts of urinary retention with need for intermittent catheterizations volume 600 mL. Subcutaneous heparin for DVT prophylaxis. Physical occupational therapy evaluation completed 05/24/2014 with recommendations of physical medicine rehabilitation consult.   Review of Systems  Gastrointestinal: Positive for constipation.  Musculoskeletal: Positive for myalgias and joint pain.  Neurological: Positive for weakness.   Past Medical History  Diagnosis Date  . Diabetes mellitus   . Hypertension   . Cellulitis and abscess of foot 05/13/2014    rt foot  . 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 Abdominal aortagram N/A 05/20/2014    Procedure: ABDOMINAL Maxcine Ham;  Surgeon: Serafina Mitchell, MD;  Location: [facility] CATH LAB;  Service: Cardiovascular;  Laterality: N/A;   Family History  Problem Relation Age of Onset  . Heart disease      No family history   Social History:  reports that she has  never smoked. She has never used smokeless tobacco. She reports that she does not drink alcohol or use illicit drugs. Allergies:  Allergies  Allergen Reactions  . Ace Inhibitors     cough  . Bactrim [Sulfamethoxazole-Trimethoprim] Nausea And Vomiting    Pt and family verified that pt takes this medication   Medications Prior to Admission  Medication Sig Dispense Refill  . acetaminophen (TYLENOL) 500 MG tablet Take 1 tablet (500 mg total) by mouth every 6 (six) hours as needed. (Patient taking differently: Take 500 mg by mouth every 6 (six) hours as needed for mild pain. ) 30 tablet 0  . amLODipine (NORVASC) 5 MG tablet Take 1 tablet (5 mg total) by mouth daily. 90 tablet 3  . diclofenac sodium (VOLTAREN) 1 % GEL Apply 4 g topically 4 (four) times daily. For shoulder pain 3 Tube 0  . glimepiride (AMARYL) 2 MG tablet Take 2 mg by mouth daily with breakfast.    . HYDROcodone-acetaminophen (NORCO/VICODIN) 5-325 MG per tablet Take 1-2 tablets by mouth every 4 (four) hours as needed. 12 tablet 0  . lisinopril (PRINIVIL,ZESTRIL) 40 MG tablet Take 40 mg by mouth daily.    . metFORMIN (GLUCOPHAGE) 1000 MG tablet Take 1 tablet (1,000 mg total) by mouth 2 (two) times daily with a meal. 60 tablet 11  . simvastatin (ZOCOR) 20 MG tablet Take 40 mg by mouth daily.    Marland Kitchen sulfamethoxazole-trimethoprim (BACTRIM DS,SEPTRA DS) 800-160 MG per tablet Take 1 tablet by mouth 2 (two) times daily.    Marland Kitchen aspirin 81 MG tablet Take 1 tablet (81 mg total) by mouth daily. (Patient not taking: Reported on 05/13/2014) 100 tablet 0  Home: Home Living Family/patient expects to be discharged to:: Private residence Living Arrangements: Spouse/significant other (husband) Available Help at Discharge: Family, Available 24 hours/day (husband is no longer working) Type of Home: Apartment Home Access: Level entry Home Layout: Two level (Bathroom is upstairs) Alternate Level Stairs-Number of Steps: 10 Home Equipment: Wheelchair  - manual Additional Comments: Lives in [place]. - Has a bathtub   Functional History: Prior Function Level of Independence: Needs assistance Gait / Transfers Assistance Needed: Uses a wheelchair for mobility at home ADL's / Homemaking Assistance Needed: Extended friends and family were assisting with ADLs  Functional Status:  Mobility: Bed Mobility Overal bed mobility: Needs Assistance, +2 for physical assistance Bed Mobility: Supine to Sit Supine to sit: Min assist, HOB elevated General bed mobility comments: Min assist for initial trunk support, then tolerated unsupported sitting. Min assist for scoot with bed pad to rotate to edge of bed. Tactile cues for directions. Transfers Overall transfer level: Needs assistance Equipment used: Rolling walker (2 wheeled) Transfers: Sit to/from Stand, [employer] Transfers Sit to Stand: Mod assist, +2 physical assistance Stand pivot transfers: Mod assist, +2 physical assistance General transfer comment: Mod assist for boost and balance with cues for technique from lowest bed setting, tactile cues for hand placement. Required Rt knee block initially however progressed and was able to pivot with small steps using the RW for support. Mod assist +2 for balance and walker placement. VC for technique via interpreter throughout. No buckling but attempts to sit prematurely requiring additional cues for upright posture.      ADL: ADL Overall ADL's : Needs assistance/impaired Eating/Feeding: Independent Grooming: Wash/dry face, Wash/dry hands, Oral care, Brushing hair, Set up, Sitting, Bed level Upper Body Bathing: Set up, Sitting, Bed level Lower Body Bathing: Moderate assistance, Bed level, Sit to/from stand Upper Body Dressing : Minimal assistance, Sitting Lower Body Dressing: Total assistance, Sit to/from stand Toilet Transfer: Moderate assistance, +2 for physical assistance, Stand-pivot, RW, BSC Toileting- Clothing Manipulation and Hygiene:  Total assistance, Sit to/from stand Functional mobility during ADLs: Moderate assistance, +2 for physical assistance, Rolling walker General ADL Comments: Pt moves slowly due to pain.  Spouse present and very supportive  Cognition: Cognition Overall Cognitive Status: Difficult to assess Orientation Level: Oriented to person, Oriented to place, Oriented to time Cognition Arousal/Alertness: Awake/alert Behavior During Therapy: WFL for tasks assessed/performed Overall Cognitive Status: Difficult to assess Difficult to assess due to: Non-English speaking (Answering questions appropriately via interpreter.)  Blood pressure 144/69, pulse 83, temperature 99.1 F (37.3 C), temperature source Oral, resp. rate 18, height 5' (1.524 m), weight 58.9 kg (129 lb 13.6 oz), SpO2 97 %. Physical Exam  Vitals reviewed. HENT:  Head: Normocephalic.  Eyes: EOM are normal.  Neck: Normal range of motion. Neck supple. No thyromegaly present.  Cardiovascular: Normal rate and regular rhythm.   Respiratory: Effort normal and breath sounds normal. No respiratory distress.  GI: Soft. Bowel sounds are normal. She exhibits no distension.  Neurological: She is alert.  Limited English-speaking. She did follow simple demonstrated commands. Answers simple yes no questions.  Skin:  Right BKA site is dressed appropriately tender    Results for orders placed or performed during the hospital encounter of 05/13/14 (from the past 24 hour(s))  Glucose, capillary     Status: Abnormal   Collection Time: 05/25/14  6:02 AM  Result Value Ref Range   Glucose-Capillary 215 (H) 70 - 99 mg/dL  Glucose, capillary     Status: Abnormal   Collection  Time: 05/25/14 11:36 AM  Result Value Ref Range   Glucose-Capillary 182 (H) 70 - 99 mg/dL  Urinalysis, Routine w reflex microscopic     Status: Abnormal   Collection Time: 05/25/14  3:05 PM  Result Value Ref Range   Color, Urine YELLOW YELLOW   APPearance CLEAR CLEAR   Specific  Gravity, Urine 1.017 1.005 - 1.030   pH 5.5 5.0 - 8.0   Glucose, UA NEGATIVE NEGATIVE mg/dL   Hgb urine dipstick NEGATIVE NEGATIVE   Bilirubin Urine NEGATIVE NEGATIVE   Ketones, ur 15 (A) NEGATIVE mg/dL   Protein, ur NEGATIVE NEGATIVE mg/dL   Urobilinogen, UA 0.2 0.0 - 1.0 mg/dL   Nitrite NEGATIVE NEGATIVE   Leukocytes, UA NEGATIVE NEGATIVE  Glucose, capillary     Status: Abnormal   Collection Time: 05/25/14  4:18 PM  Result Value Ref Range   Glucose-Capillary 160 (H) 70 - 99 mg/dL  Glucose, capillary     Status: Abnormal   Collection Time: 05/25/14  9:52 PM  Result Value Ref Range   Glucose-Capillary 174 (H) 70 - 99 mg/dL  CBC     Status: Abnormal   Collection Time: 05/26/14  4:38 AM  Result Value Ref Range   WBC 12.5 (H) 4.0 - 10.5 K/uL   RBC 3.41 (L) 3.87 - 5.11 MIL/uL   Hemoglobin 8.7 (L) 12.0 - 15.0 g/dL   HCT 27.2 (L) 36.0 - 46.0 %   MCV 79.8 78.0 - 100.0 fL   MCH 25.5 (L) 26.0 - 34.0 pg   MCHC 32.0 30.0 - 36.0 g/dL   RDW 13.4 11.5 - 15.5 %   Platelets 414 (H) 150 - 400 K/uL  Basic metabolic panel     Status: Abnormal   Collection Time: 05/26/14  4:38 AM  Result Value Ref Range   Sodium 136 135 - 145 mmol/L   Potassium 3.5 3.5 - 5.1 mmol/L   Chloride 97 96 - 112 mmol/L   CO2 28 19 - 32 mmol/L   Glucose, Bld 178 (H) 70 - 99 mg/dL   BUN <5 (L) 6 - 23 mg/dL   Creatinine, Ser 0.53 0.50 - 1.10 mg/dL   Calcium 8.6 8.4 - 10.5 mg/dL   GFR calc non Af Amer >90 >90 mL/min   GFR calc Af Amer >90 >90 mL/min   Anion gap 11 5 - 15   No results found.  Assessment/Plan: Diagnosis: right bka 1. Does the need for close, 24 hr/day medical supervision in concert with the patient's rehab needs make it unreasonable for this patient to be served in a less intensive setting? Yes 2. Co-Morbidities requiring supervision/potential complications: dm2, pain mgt 3. Due to bladder management, bowel management, safety, skin/wound care, disease management and pain management, does the patient  require 24 hr/day rehab nursing? Yes 4. Does the patient require coordinated care of a physician, rehab nurse, PT (1-2 hrs/day, 5 days/week) and OT (1-2 hrs/day, 5 days/week) to address physical and functional deficits in the context of the above medical diagnosis(es)? Yes Addressing deficits in the following areas: balance, endurance, locomotion, strength, transferring, bowel/bladder control, bathing, dressing, feeding, grooming, toileting and psychosocial support 5. Can the patient actively participate in an intensive therapy program of at least 3 hrs of therapy per day at least 5 days per week? Yes 6. The potential for patient to make measurable gains while on inpatient rehab is excellent 7. Anticipated functional outcomes upon discharge from inpatient rehab are modified independent and supervision  with PT, modified independent  and supervision with OT, n/a with SLP. 8. Estimated rehab length of stay to reach the above functional goals is: 8-12 days 9. Does the patient have adequate social supports and living environment to accommodate these discharge functional goals? Yes 10. Anticipated D/C setting: Home 11. Anticipated post D/C treatments: [facility] therapy 12. Overall Rehab/Functional Prognosis: excellent  RECOMMENDATIONS: This patient's condition is appropriate for continued rehabilitative care in the following setting: CIR Patient has agreed to participate in recommended program. Potentially Note that insurance prior authorization may be required for reimbursement for recommended care.  Comment: Rehab Admissions Coordinator to follow up.  Thanks,  Meredith Staggers, MD, Mellody Drown     05/26/2014

## 2014-05-26 NOTE — Progress Notes (Addendum)
M.D was paged to make her aware that pt is not drinking or voiding. Pt was bladder scanned at 0002; 238ml in bladder. M.D informed RN to follow order to I/O cath pt if bladder is >443ml. Pt encouraged to drink water.  RN will cont to monitor and follow orders.

## 2014-05-26 NOTE — H&P (Signed)
Expand All Collapse All      Physical Medicine and Rehabilitation Admission H&P   Chief complaint: Leg pain  HPI: Kimberly Lane is a 54 y.o. right handed limited English speaking refugee female with history of hypertension, diabetes mellitus with peripheral neuropathy. Presented 05/20/2014 with right fifth toe wound that occurred approximately 2 weeks ago after she hit her toe against a chair at home. Patient lives with her husband had been using a wheelchair recently due to right foot wound. The pain has progressively worsened with cellulitic changes. X-rays show no evidence of osteomyelitis. Noted soft tissue swelling. MRI of the right foot again showed soft tissue laceration involving the fifth toe. Patient with progressive ischemic changes. 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 Limb was not felt to be salvageable and underwent right BKA 05/23/2014 per Dr. Kellie Simmering. Hospital course pain management. Acute blood loss anemia 8.7 and monitored. Follow-up prosthetic care with [facility] prosthetics. Bouts of urinary retention with need for intermittent catheterizations volume 600 mL. Subcutaneous heparin for DVT prophylaxis. Physical occupational therapy evaluation completed 05/24/2014 with recommendations of physical medicine rehabilitation consult. Patient was admitted for comprehensive rehabilitation program  ROS Review of Systems  Gastrointestinal: Positive for constipation.  Musculoskeletal: Positive for myalgias and joint pain.  Neurological: Positive for weakness Remainder review of systems negative    Past Medical History  Diagnosis Date  . Diabetes mellitus   . Hypertension   . Cellulitis and abscess of foot 05/13/2014    rt foot  . 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 Abdominal aortagram N/A 05/20/2014    Procedure: ABDOMINAL Maxcine Ham; Surgeon: Serafina Mitchell, MD; Location: [facility] CATH  LAB; Service: Cardiovascular; Laterality: N/A;   Family History  Problem Relation Age of Onset  . Heart disease      No family history   Social History:  reports that she has never smoked. She has never used smokeless tobacco. She reports that she does not drink alcohol or use illicit drugs. Allergies:  Allergies  Allergen Reactions  . Ace Inhibitors     cough  . Bactrim [Sulfamethoxazole-Trimethoprim] Nausea And Vomiting    Pt and family verified that pt takes this medication   Medications Prior to Admission  Medication Sig Dispense Refill  . acetaminophen (TYLENOL) 500 MG tablet Take 1 tablet (500 mg total) by mouth every 6 (six) hours as needed. (Patient taking differently: Take 500 mg by mouth every 6 (six) hours as needed for mild pain. ) 30 tablet 0  . amLODipine (NORVASC) 5 MG tablet Take 1 tablet (5 mg total) by mouth daily. 90 tablet 3  . diclofenac sodium (VOLTAREN) 1 % GEL Apply 4 g topically 4 (four) times daily. For shoulder pain 3 Tube 0  . glimepiride (AMARYL) 2 MG tablet Take 2 mg by mouth daily with breakfast.    . HYDROcodone-acetaminophen (NORCO/VICODIN) 5-325 MG per tablet Take 1-2 tablets by mouth every 4 (four) hours as needed. 12 tablet 0  . lisinopril (PRINIVIL,ZESTRIL) 40 MG tablet Take 40 mg by mouth daily.    . metFORMIN (GLUCOPHAGE) 1000 MG tablet Take 1 tablet (1,000 mg total) by mouth 2 (two) times daily with a meal. 60 tablet 11  . simvastatin (ZOCOR) 20 MG tablet Take 40 mg by mouth daily.    Marland Kitchen sulfamethoxazole-trimethoprim (BACTRIM DS,SEPTRA DS) 800-160 MG per tablet Take 1 tablet by mouth 2 (two) times daily.    Marland Kitchen aspirin 81 MG tablet Take 1 tablet (  81 mg total) by mouth daily. (Patient not taking: Reported on 05/13/2014) 100 tablet 0    Home: Home Living Family/patient expects to be discharged to:: Private residence Living Arrangements: Spouse/significant other  (husband) Available Help at Discharge: Family, Available 24 hours/day (husband is no longer working) Type of Home: Apartment Home Access: Level entry Home Layout: Two level (Bathroom is upstairs) Alternate Level Stairs-Number of Steps: 10 Home Equipment: Wheelchair - manual Additional Comments: Lives in [place]. - Has a bathtub   Functional History: Prior Function Level of Independence: Needs assistance Gait / Transfers Assistance Needed: Uses a wheelchair for mobility at home ADL's / Homemaking Assistance Needed: Extended friends and family were assisting with ADLs   Functional Status:  Mobility: Bed Mobility Overal bed mobility: Needs Assistance, +2 for physical assistance Bed Mobility: Supine to Sit Supine to sit: Min assist, HOB elevated General bed mobility comments: Min assist for initial trunk support, then tolerated unsupported sitting. Min assist for scoot with bed pad to rotate to edge of bed. Tactile cues for directions. Transfers Overall transfer level: Needs assistance Equipment used: Rolling walker (2 wheeled) Transfers: Sit to/from Stand, [employer] Transfers Sit to Stand: Mod assist, +2 physical assistance Stand pivot transfers: Mod assist, +2 physical assistance General transfer comment: Mod assist for boost and balance with cues for technique from lowest bed setting, tactile cues for hand placement. Required Rt knee block initially however progressed and was able to pivot with small steps using the RW for support. Mod assist +2 for balance and walker placement. VC for technique via interpreter throughout. No buckling but attempts to sit prematurely requiring additional cues for upright posture.      ADL: ADL Overall ADL's : Needs assistance/impaired Eating/Feeding: Independent Grooming: Wash/dry face, Wash/dry hands, Oral care, Brushing hair, Set up, Sitting, Bed level Upper Body Bathing: Set up, Sitting, Bed level Lower Body Bathing: Moderate assistance,  Bed level, Sit to/from stand Upper Body Dressing : Minimal assistance, Sitting Lower Body Dressing: Total assistance, Sit to/from stand Toilet Transfer: Moderate assistance, +2 for physical assistance, Stand-pivot, RW, BSC Toileting- Clothing Manipulation and Hygiene: Total assistance, Sit to/from stand Functional mobility during ADLs: Moderate assistance, +2 for physical assistance, Rolling walker General ADL Comments: Pt moves slowly due to pain. Spouse present and very supportive  Cognition: Cognition Overall Cognitive Status: Difficult to assess Orientation Level: Oriented to person, Oriented to place, Oriented to time Cognition Arousal/Alertness: Awake/alert Behavior During Therapy: WFL for tasks assessed/performed Overall Cognitive Status: Difficult to assess Difficult to assess due to: Non-English speaking (Answering questions appropriately via interpreter.)  Physical Exam: Blood pressure 144/69, pulse 83, temperature 99.1 F (37.3 C), temperature source Oral, resp. rate 18, height 5' (1.524 m), weight 58.9 kg (129 lb 13.6 oz), SpO2 97 %. Physical Exam Vitals reviewed. HENT: oral mucosa pink/moist Head: Normocephalic.  Eyes: EOM are normal.  Neck: Normal range of motion. Neck supple. No thyromegaly present.  Cardiovascular: Normal rate and regular rhythm.  Respiratory: Effort normal and breath sounds normal. No respiratory distress.  GI: Soft. Bowel sounds are normal. She exhibits no distension.  Neurological: She is alert.  Limited English-speaking. She did follow simple demonstrated commands. Answers simple yes no questions. UE 5/5 RLE: 3/5 HF, LLE: 4/5 prox to distal. No obvious sensory deficits.  Skin:  Right BKA site is dressed appropriately tender     Lab Results Last 48 Hours    Results for orders placed or performed during the hospital encounter of 05/13/14 (from the past 48 hour(s))  Glucose, capillary Status: Abnormal   Collection Time:  05/24/14 11:41 AM  Result Value Ref Range   Glucose-Capillary 394 (H) 70 - 99 mg/dL  Glucose, capillary Status: Abnormal   Collection Time: 05/24/14 4:17 PM  Result Value Ref Range   Glucose-Capillary 286 (H) 70 - 99 mg/dL  Glucose, capillary Status: Abnormal   Collection Time: 05/24/14 9:18 PM  Result Value Ref Range   Glucose-Capillary 200 (H) 70 - 99 mg/dL  Basic metabolic panel Status: Abnormal   Collection Time: 05/25/14 5:03 AM  Result Value Ref Range   Sodium 135 135 - 145 mmol/L   Potassium 3.3 (L) 3.5 - 5.1 mmol/L   Chloride 97 96 - 112 mmol/L   CO2 29 19 - 32 mmol/L   Glucose, Bld 218 (H) 70 - 99 mg/dL   BUN <5 (L) 6 - 23 mg/dL   Creatinine, Ser 0.50 0.50 - 1.10 mg/dL   Calcium 8.6 8.4 - 10.5 mg/dL   GFR calc non Af Amer >90 >90 mL/min   GFR calc Af Amer >90 >90 mL/min    Comment: (NOTE) The eGFR has been calculated using the CKD EPI equation. This calculation has not been validated in all clinical situations. eGFR's persistently <90 mL/min signify possible Chronic Kidney Disease.    Anion gap 9 5 - 15  CBC Status: Abnormal   Collection Time: 05/25/14 5:03 AM  Result Value Ref Range   WBC 12.3 (H) 4.0 - 10.5 K/uL   RBC 3.43 (L) 3.87 - 5.11 MIL/uL   Hemoglobin 8.7 (L) 12.0 - 15.0 g/dL   HCT 27.4 (L) 36.0 - 46.0 %   MCV 79.9 78.0 - 100.0 fL   MCH 25.4 (L) 26.0 - 34.0 pg   MCHC 31.8 30.0 - 36.0 g/dL   RDW 13.5 11.5 - 15.5 %   Platelets 412 (H) 150 - 400 K/uL  Glucose, capillary Status: Abnormal   Collection Time: 05/25/14 6:02 AM  Result Value Ref Range   Glucose-Capillary 215 (H) 70 - 99 mg/dL  Glucose, capillary Status: Abnormal   Collection Time: 05/25/14 11:36 AM  Result Value Ref Range   Glucose-Capillary 182 (H) 70 - 99 mg/dL  Urinalysis, Routine w reflex microscopic Status:  Abnormal   Collection Time: 05/25/14 3:05 PM  Result Value Ref Range   Color, Urine YELLOW YELLOW   APPearance CLEAR CLEAR   Specific Gravity, Urine 1.017 1.005 - 1.030   pH 5.5 5.0 - 8.0   Glucose, UA NEGATIVE NEGATIVE mg/dL   Hgb urine dipstick NEGATIVE NEGATIVE   Bilirubin Urine NEGATIVE NEGATIVE   Ketones, ur 15 (A) NEGATIVE mg/dL   Protein, ur NEGATIVE NEGATIVE mg/dL   Urobilinogen, UA 0.2 0.0 - 1.0 mg/dL   Nitrite NEGATIVE NEGATIVE   Leukocytes, UA NEGATIVE NEGATIVE    Comment: MICROSCOPIC NOT DONE ON URINES WITH NEGATIVE PROTEIN, BLOOD, LEUKOCYTES, NITRITE, OR GLUCOSE <1000 mg/dL.  Glucose, capillary Status: Abnormal   Collection Time: 05/25/14 4:18 PM  Result Value Ref Range   Glucose-Capillary 160 (H) 70 - 99 mg/dL  Glucose, capillary Status: Abnormal   Collection Time: 05/25/14 9:52 PM  Result Value Ref Range   Glucose-Capillary 174 (H) 70 - 99 mg/dL  CBC Status: Abnormal   Collection Time: 05/26/14 4:38 AM  Result Value Ref Range   WBC 12.5 (H) 4.0 - 10.5 K/uL   RBC 3.41 (L) 3.87 - 5.11 MIL/uL   Hemoglobin 8.7 (L) 12.0 - 15.0 g/dL   HCT 27.2 (L) 36.0 - 46.0 %  MCV 79.8 78.0 - 100.0 fL   MCH 25.5 (L) 26.0 - 34.0 pg   MCHC 32.0 30.0 - 36.0 g/dL   RDW 13.4 11.5 - 15.5 %   Platelets 414 (H) 150 - 400 K/uL  Basic metabolic panel Status: Abnormal   Collection Time: 05/26/14 4:38 AM  Result Value Ref Range   Sodium 136 135 - 145 mmol/L   Potassium 3.5 3.5 - 5.1 mmol/L   Chloride 97 96 - 112 mmol/L   CO2 28 19 - 32 mmol/L   Glucose, Bld 178 (H) 70 - 99 mg/dL   BUN <5 (L) 6 - 23 mg/dL   Creatinine, Ser 0.53 0.50 - 1.10 mg/dL   Calcium 8.6 8.4 - 10.5 mg/dL   GFR calc non Af Amer >90 >90 mL/min   GFR calc Af Amer >90 >90 mL/min    Comment: (NOTE) The eGFR has been calculated  using the CKD EPI equation. This calculation has not been validated in all clinical situations. eGFR's persistently <90 mL/min signify possible Chronic Kidney Disease.    Anion gap 11 5 - 15  Glucose, capillary Status: Abnormal   Collection Time: 05/26/14 5:59 AM  Result Value Ref Range   Glucose-Capillary 199 (H) 70 - 99 mg/dL      Imaging Results (Last 48 hours)    No results found.       Medical Problem List and Plan: 1. Functional deficits secondary to right BKA secondary to PVD 2. DVT Prophylaxis/Anticoagulation: Subcutaneous heparin. Monitor platelet counts for any signs of bleeding 3. Pain Management: Oxycodone as needed. Monitor with increased mobility 4. Acute blood loss anemia. Follow-up CBC 5. Neuropsych: This patient is capable of making decisions on her own behalf. 6. Skin/Wound Care: Routine skin checks 7. Fluids/Electrolytes/Nutrition: Strict I and O follow-up chemistries 8. Diabetes mellitus and peripheral neuropathy. Hemoglobin A1c 9.6. Lantus insulin 20 units daily. Check blood sugars before meals and at bedtime 9. Hypertension. Lisinopril 20 mg daily, Norvasc 10 mg daily. Monitor with increased mobility 10. Hyperlipidemia. Lipitor2 11. Constipation. Adjust bowel program  Post Admission Physician Evaluation: 1. Functional deficits secondary to right BKA. 2. Patient is admitted to receive collaborative, interdisciplinary care between the physiatrist, rehab nursing staff, and therapy team. 3. Patient's level of medical complexity and substantial therapy needs in context of that medical necessity cannot be provided at a lesser intensity of care such as a SNF. 4. Patient has experienced substantial functional loss from his/her baseline which was documented above under the "Functional History" and "Functional Status" headings. Judging by the patient's diagnosis, physical exam, and functional history, the patient has potential for functional  progress which will result in measurable gains while on inpatient rehab. These gains will be of substantial and practical use upon discharge in facilitating mobility and self-care at the household level. 5. Physiatrist will provide 24 hour management of medical needs as well as oversight of the therapy plan/treatment and provide guidance as appropriate regarding the interaction of the two. 6. 24 hour rehab nursing will assist with bladder management, bowel management, safety, skin/wound care, disease management, medication administration, pain management and patient education and help integrate therapy concepts, techniques,education, etc. 7. PT will assess and treat for/with: Lower extremity strength, range of motion, stamina, balance, functional mobility, safety, adaptive techniques and equipment, pre-prosthetic education, pain mg. Goals are: mod i. 8. OT will assess and treat for/with: ADL's, functional mobility, safety, upper extremity strength, adaptive techniques and equipment, painmgt, ego support, wound mgt. Goals are: mod I to  supervision. Therapy may not yet proceed with showering this patient. 9. SLP will assess and treat for/with: n/a. Goals are: n/a. 10. Case Management and Social Worker will assess and treat for psychological issues and discharge planning. 11. Team conference will be held weekly to assess progress toward goals and to determine barriers to discharge. 12. Patient will receive at least 3 hours of therapy per day at least 5 days per week. 13. ELOS: 7-10 days  14. Prognosis: excellent     Meredith Staggers, MD, [facility] 05/26/2014

## 2014-05-26 NOTE — Progress Notes (Signed)
Pt bladder scanned; 642ml in bladder. I/O cath performed; 600 ml urine output. I/O was very painful for pt. RN will cont to monitor.

## 2014-05-26 NOTE — Discharge Instructions (Signed)
You were admitted to the hospital with a leg infection. While here, we gave you IV antibiotics. We found that the arteries in your legs were not supplying enough blood to your legs. You met with our vascular surgeon. After long discussion, it was decided that to proceed with a below-the-knee amputation. You will be going to our inpatient rehab facility. It is very important that you follow up with the [facility] and it is very important that you follow up with your surgeon.

## 2014-05-26 NOTE — Progress Notes (Signed)
Orthopedic Tech Progress Note Patient Details:  Kimberly Lane 07-Feb-1961 [id number] [employer] called to place brace order; order taken by Tye [place] Patient ID: [name], female   DOB: 29-Oct-1960, 54 y.o.   MRN: [id number]   Fenton Foy 05/26/2014, 10:54 AM

## 2014-05-26 NOTE — Progress Notes (Signed)
Family Medicine Teaching Service Daily Progress Note Intern Pager: [phone]  Patient name: Kimberly Lane Medical record number: [id number] Date of birth: 05-20-1960 Age: 54 y.o. Gender: female  Primary Care Provider: Marina Goodell, MD Consultants: None Code Status: Full  Pt Overview and Major Events to Date:  3/22- Admitted with infection of RLE; Vanc/zosyn started 3/24- Vascular consulted for PAD and concern for gangrene 4/1 - Patient underwent right BKA  ABX/Culture Vanc 3/22 >> 3/24 Zosyn 3/22 >> 3/24 Levaquin 3/24 >> 4/1 Clinda 3/24 >> 3/31 Cefuroxime 4/1 > 4/2 (Post-BKA protocol)  Blood cx 3/22 >> NG Final.   Assessment and Plan: Kimberly Lane is a 54 y.o. female presenting with foot infection / nonhealing wound 2/2 blunt traumatic injury to right 5th digit. PMH is significant for DM2, HTN, hx of primary TB and treated, HLD.   PAD, Dry Gangrene of right fifth toe, s/p right BKA 4/1: ABIs ~0.4. Complicated by uncontrolled diabetes.  - Reactive leukocytosis noted, will continue to monitor - Continue ASA and statin.  - PT and OT consulted, inpatient rehab also following. - Pain control: Scheduled oxycodone, oxycodone prn, morphine IV prn - Bowel regimen: Colace, miralax - Post-op f/u May 3rd at 8:30 with dr. Kellie Lane - Social work consulted for placement: SNF vs CIR  T2DM: A1c 9.6. On Amaryl and Metformin at home with reported compliance. Will likely require insulin at home. Currently holding home PO meds. Has been on insulin in the past. - Lantus at 20u and continue moderate SSI AC/HS.  - Will need glucose meter strips and lancets at discharge  HTN:  - Continue amlodipine 10mg  and lisinopril 20mg  - Labetalol prn  High risk social situation: Patient is a [name] refugee and speaks a dialect of Arabic, she has little social support though she has apparently been able to obtains meds through the MAP program. Her husband is currently in [place] and has no other family in the  [place].  - Will likely be discharged to CIR vs SNF  FEN/GI: Carb-modified/heart-healthy diet, saline lock Prophylaxis: heparin SubQ   Disposition: Medically stable. Anticipate discharge to CIR vs SNF.   Subjective:   Pain much better controlled this morning. Rates as 2-3 out of 10. Has been able to eat small amounts of food. Also complains of feeling hot.   Objective: Temp:  [99.1 F (37.3 C)-100.6 F (38.1 C)] 99.1 F (37.3 C) (04/04 0416) Pulse Rate:  [83-97] 83 (04/04 0416) Resp:  [18-20] 18 (04/04 0416) BP: (141-145)/(57-69) 144/69 mmHg (04/04 0416) SpO2:  [93 %-100 %] 97 % (04/04 0416) Physical Exam: General: Female sitting up in bed, NAD Cardiovascular: RRR, systolic 2/6 ejection murmur at RUSB Respiratory: CTAB, appropriate rate, unlabored.  Abdomen: S, NT, No guarding, no rebound or peritoneal signs, ND, +BS Extremities: s/p right BKA. Dressing in place, dry and intact without notable tracking of erythema Neuro: alert and interactive. No focal deficits.   Laboratory:  Recent Labs Lab 05/24/14 0455 05/25/14 0503 05/26/14 0438  WBC 15.0* 12.3* 12.5*  HGB 9.2* 8.7* 8.7*  HCT 28.3* 27.4* 27.2*  PLT 421* 412* 414*    Recent Labs Lab 05/24/14 0455 05/25/14 0503 05/26/14 0438  NA 134* 135 136  K 3.3* 3.3* 3.5  CL 98 97 97  CO2 31 29 28   BUN <5* <5* <5*  CREATININE 0.52 0.50 0.53  CALCIUM 8.7 8.6 8.6  GLUCOSE 283* 218* 178*    Recent Labs Lab 05/25/14 0602 05/25/14 1136 05/25/14 1618 05/25/14 2152 05/26/14 0559  GLUCAP  [place], MD 05/26/2014, 7:34 AM PGY-1, [place] Intern pager: [phone], text pages welcome

## 2014-05-26 NOTE — Progress Notes (Signed)
[facility]  Subjective  - Patient is wearing a wash cloth on her head and says she is hot.   Objective 144/69 83 99.1 F (37.3 C) (Oral) 18 97%  Intake/Output Summary (Last 24 hours) at 05/26/14 0726 Last data filed at 05/26/14 0536  Gross per 24 hour  Intake      0 ml  Output   1035 ml  Net  -1035 ml    Right BKA is healing well without hematoma or edema. No active bleeding at drain sites   Assessment/Planning: POD #3 Right BKA  Will get sock from [place] remain for 4 weeks.  F/U in office in 4 weeks after discharge   Laurence Slate [facility] 05/26/2014 7:26 AM --  Laboratory Lab Results:  Recent Labs  05/25/14 0503 05/26/14 0438  WBC 12.3* 12.5*  HGB 8.7* 8.7*  HCT 27.4* 27.2*  PLT 412* 414*   BMET  Recent Labs  05/25/14 0503 05/26/14 0438  NA 135 136  K 3.3* 3.5  CL 97 97  CO2 29 28  GLUCOSE 218* 178*  BUN <5* <5*  CREATININE 0.50 0.53  CALCIUM 8.6 8.6    COAG No results found for: INR, PROTIME No results found for: PTT

## 2014-05-26 NOTE — Progress Notes (Signed)
Rehab admissions - I met with pt with a translator present in follow up to rehab MD consult to explain the possibility of inpatient rehab. Pt's husband, a neighbor and family friend were also present. Pt's neighbor also spoke English but the translator provided needed translation.  Both pt and her husband are willing to come to inpatient rehab. Further information was given about our rehab program and informational brochures were given.   I then got medical clearance from family teaching service and rehab bed is available today. We will admit pt to inpatient rehab later today.   I updated Alecia, case manager and Jenna, social worker. Pt's Rn is also aware.   I completed admission paperwork with translator present with both pt and her husband.  Please call me with any questions. Thanks.  Janine Turnington, PT Rehabilitation Admissions Coordinator [phone]   

## 2014-05-26 NOTE — Progress Notes (Signed)
Inpatient Diabetes Program Recommendations  AACE/ADA: New Consensus Statement on Inpatient Glycemic Control (2013)  Target Ranges:  Prepandial:   less than 140 mg/dL      Peak postprandial:   less than 180 mg/dL (1-2 hours)      Critically ill patients:  140 - 180 mg/dL   Noted increase in lantus to 20 units to start tonight. Should be helpful in 24 hr control as evidenced by the fasting glucose to start the day. Also noted HgbA1C of 9.6%, however the Hgb levels have been low and may well have affect on the A1C levels. The cellulitis has also contributed to the elevated A1C. Thank you Rosita Kea, RN, MSN, CDE  Diabetes Inpatient Program Office: [phone] Pager: [phone] 8:00 am to 5:00 pm

## 2014-05-26 NOTE — Progress Notes (Signed)
CARE MANAGEMENT NOTE 05/26/2014  Patient:  Abplanalp,Joscelin   Account Number:  [id number]  Date Initiated:  05/15/2014  Documentation initiated by:  Tomi Bamberger  Subjective/Objective Assessment:   dx cellulits of foot  admit- lives with family, speaks arabic, patient has [facility] card and goes to [place] [facility].     Action/Plan:   Anticipated DC Date:  05/23/2014   Anticipated DC Plan:  IP REHAB FACILITY      DC Planning Services  CM consult  Follow-up appt scheduled      Choice offered to / List presented to:             Status of service:  Completed, signed off Medicare Important Message given?  NO (If response is "NO", the following Medicare IM given date fields will be blank) Date Medicare IM given:   Medicare IM given by:   Date Additional Medicare IM given:   Additional Medicare IM given by:    Discharge Disposition:  IP REHAB FACILITY  Per UR Regulation:  Reviewed for med. necessity/level of care/duration of stay  If discussed at [place] of Stay Meetings, dates discussed:   05/22/2014    Comments:  05/26/2014 1600 Scheduled dc to CIR 4/4//2016. Jonnie Finner RN CCM Case Mgmt phone [phone]  05/21/14- 1400- Marvetta Gibbons RN, BSN [phone] Vascular following pt-- per MD note-  Needs right leg tibial bypass graft, scheduling pending. Vein mapping ordered. Requires amputation of the right fifth toe, however prior to proceeding, she is going to need revascularization.  - Continue clindamycin / levaquin for diabetic foot infection coverage. 2-4 weeks and pending amputation.  05/15/14 [place], BSN [phone] patient lives with family members , she has an [facility] card and [facility], apt scheduled for 3/28 at 11 am.

## 2014-05-26 NOTE — PMR Pre-admission (Signed)
PMR Admission Coordinator Pre-Admission Assessment  Patient: Kimberly Lane is an 54 y.o., female MRN: [id number] DOB: 1960-08-22 Height: 5' (152.4 cm) (unable to tell,pt Education officer, museum reported) Weight: 58.9 kg (129 lb 13.6 oz)              Insurance Information Pt is self pay. Husband reports that the Medicaid/disability application has been started.  Emergency Contact Information Contact Information    Name Relation Home Work Mobile   Kimberly,Lane Spouse [phone]  [phone]   Aron Baba   [phone]   Sarina Ser [phone]     Arvella Nigh Other [phone]       Current Medical History  Patient Admitting Diagnosis: Right BKA  History of Present Illness: Kimberly Lane Was is a 54 y.o. right handed limited English speaking refugee female with history of hypertension, diabetes mellitus with peripheral neuropathy. Presented 05/20/2014 with right fifth toe wound that occurred approximately 2 weeks ago after she hit her toe against a chair at home. Patient lives with her husband had been using a wheelchair recently due to right foot wound. The pain has progressively worsened with cellulitic changes. X-rays show no evidence of osteomyelitis. Noted soft tissue swelling. MRI of the right foot again showed soft tissue laceration involving the fifth toe. Patient with progressive ischemic changes. 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 Limb was not felt to be salvageable and underwent right BKA 05/23/2014 per Dr. Kellie Simmering. Hospital course pain management. Acute blood loss anemia 8.7 and monitored. Bouts of urinary retention with need for intermittent catheterizations volume 600 mL. Subcutaneous heparin for DVT prophylaxis. Physical occupational therapy evaluation completed 05/24/2014 with recommendations of physical medicine rehabilitation consult.   Past Medical History  Past Medical History  Diagnosis Date  . Diabetes mellitus   . Hypertension    . Cellulitis and abscess of foot 05/13/2014    rt foot  . 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  Prior Rehab/Hospitalizations: none   Current Medications   Current facility-administered medications:  .  0.9 %  sodium chloride infusion, , Intravenous, Continuous, Serafina Mitchell, MD, Stopped at 05/21/14 1400 .  acetaminophen (TYLENOL) tablet 650 mg, 650 mg, Oral, Q6H PRN, 650 mg at 05/25/14 2246 **OR** acetaminophen (TYLENOL) suppository 650 mg, 650 mg, Rectal, Q6H PRN, Elberta Leatherwood, MD .  alum & mag hydroxide-simeth (MAALOX/MYLANTA) 200-200-20 MG/5ML suspension 15-30 mL, 15-30 mL, Oral, Q2H PRN, Serafina Mitchell, MD .  amLODipine (NORVASC) tablet 10 mg, 10 mg, Oral, Daily, Aquilla Hacker, MD, 10 mg at 05/25/14 0959 .  aspirin EC tablet 81 mg, 81 mg, Oral, Daily, Rosemarie Ax, MD, 81 mg at 05/25/14 1150 .  atorvastatin (LIPITOR) tablet 40 mg, 40 mg, Oral, Daily, Aquilla Hacker, MD, 40 mg at 05/25/14 1000 .  docusate sodium (COLACE) capsule 100 mg, 100 mg, Oral, BID, Vivi Barrack, MD, 100 mg at 05/25/14 2246 .  guaiFENesin-dextromethorphan (ROBITUSSIN DM) 100-10 MG/5ML syrup 15 mL, 15 mL, Oral, Q4H PRN, Serafina Mitchell, MD .  heparin injection 5,000 Units, 5,000 Units, Subcutaneous, 3 times per day, Elberta Leatherwood, MD, 5,000 Units at 05/26/14 0540 .  insulin aspart (novoLOG) injection 0-15 Units, 0-15 Units, Subcutaneous, TID WC, Aquilla Hacker, MD, 8 Units at 05/26/14 1341 .  insulin aspart (novoLOG) injection 0-5 Units, 0-5 Units, Subcutaneous, QHS, Patrecia Pour, MD, 3 Units at 05/23/14 2204 .  insulin glargine (LANTUS) injection 20  Units, 20 Units, Subcutaneous, Daily, Vivi Barrack, MD, 20 Units at 05/26/14 1204 .  labetalol (NORMODYNE,TRANDATE) injection 10 mg, 10 mg, Intravenous, Q2H PRN, Patrecia Pour, MD .  lactated ringers infusion, , Intravenous, Continuous, Mal Misty, MD, Stopped at 05/23/14 1600 .   lactobacillus acidophilus (BACID) tablet 2 tablet, 2 tablet, Oral, TID, Elberta Leatherwood, MD, 2 tablet at 05/25/14 2246 .  lisinopril (PRINIVIL,ZESTRIL) tablet 20 mg, 20 mg, Oral, Daily, Aquilla Hacker, MD, 20 mg at 05/25/14 1000 .  morphine 4 MG/ML injection 4 mg, 4 mg, Intravenous, Q2H PRN, Vivi Barrack, MD, 4 mg at 05/26/14 0854 .  ondansetron (ZOFRAN) injection 4 mg, 4 mg, Intravenous, Q6H PRN, Samantha J Rhyne, PA-C, 4 mg at 05/26/14 0502 .  oxyCODONE (Oxy IR/ROXICODONE) immediate release tablet 5 mg, 5 mg, Oral, Q6H, Vivi Barrack, MD, 5 mg at 05/26/14 0445 .  oxyCODONE (Oxy IR/ROXICODONE) immediate release tablet 5 mg, 5 mg, Oral, Q4H PRN, Vivi Barrack, MD, 5 mg at 05/25/14 2034 .  pantoprazole (PROTONIX) EC tablet 40 mg, 40 mg, Oral, Daily, Samantha J Rhyne, PA-C, 40 mg at 05/25/14 1000 .  phenol (CHLORASEPTIC) mouth spray 1 spray, 1 spray, Mouth/Throat, PRN, Serafina Mitchell, MD .  polyethylene glycol (MIRALAX / GLYCOLAX) packet 17 g, 17 g, Oral, Daily, Vivi Barrack, MD, 17 g at 05/25/14 1150 .  polyethylene glycol (MIRALAX / GLYCOLAX) packet 17-34 g, 17-34 g, Oral, Daily PRN, Vivi Barrack, MD .  potassium chloride SA (K-DUR,KLOR-CON) CR tablet 20-40 mEq, 20-40 mEq, Oral, Daily PRN, Samantha J Rhyne, PA-C .  sodium chloride 0.9 % injection 3 mL, 3 mL, Intravenous, PRN, Elberta Leatherwood, MD  Patients Current Diet: Diet heart healthy/carb modified Room service appropriate?: Yes; Fluid consistency:: Thin  Precautions / Restrictions Precautions Precautions: Fall Restrictions Weight Bearing Restrictions: Yes RLE Weight Bearing: Weight bearing as tolerated   Prior Activity Level Community (5-7x/wk): Pt states she got out everyday (taking the bus) and took classes at [facility] [employer] for [place] as a second language. She enjoys visiting with friends and going to the community center.    Home Assistive Devices / Equipment Home Assistive Devices/Equipment: None Home Equipment: Wheelchair -  manual  Prior Functional Level Prior Function Level of Independence: Needs assistance Gait / Transfers Assistance Needed: Uses a wheelchair for mobility at home ADL's / Homemaking Assistance Needed: Extended friends and family were assisting with ADLs   Current Functional Level Cognition  Overall Cognitive Status: Within Functional Limits for tasks assessed Difficult to assess due to: Non-English speaking (Answering questions appropriately via interpreter.) Orientation Level: Oriented to person, Oriented to place, Oriented to time    Extremity Assessment (includes Sensation/Coordination)  Upper Extremity Assessment: Generalized weakness  Lower Extremity Assessment: Defer to PT evaluation RLE Deficits / Details: Guarded due to surgery. Able to move slowly. Minimal knee extension.    ADLs  Overall ADL's : Needs assistance/impaired Eating/Feeding: Independent Grooming: Wash/dry face, Wash/dry hands, Oral care, Brushing hair, Set up, Sitting, Bed level Upper Body Bathing: Set up, Sitting, Bed level Lower Body Bathing: Moderate assistance, Bed level, Sit to/from stand Upper Body Dressing : Minimal assistance, Sitting Lower Body Dressing: Total assistance, Sit to/from stand Toilet Transfer: Moderate assistance, +2 for physical assistance, Stand-pivot, RW, BSC Toileting- Clothing Manipulation and Hygiene: Total assistance, Sit to/from stand Functional mobility during ADLs: Moderate assistance, +2 for physical assistance, Rolling walker General ADL Comments: Pt moves slowly due to pain.  Spouse present and very  supportive    Mobility  Overal bed mobility: Needs Assistance, +2 for physical assistance Bed Mobility: Supine to Sit Supine to sit: HOB elevated, Mod assist General bed mobility comments: Pt preferred husband assist her (although ultimately needed PT for sequencing/technique); pt with difficulty pivoting to EOB    Transfers  Overall transfer level: Needs assistance Equipment  used: Rolling walker (2 wheeled) Transfers: Sit to/from Stand, Stand Pivot Transfers Sit to Stand: Mod assist, +2 physical assistance Stand pivot transfers: +2 physical assistance, Max assist General transfer comment: Stood twice with RW requiring significant boost during first 1/2 and then pt able to contribute much more to upright standing. During pivot pt unable to fully unweight LLE to "hop" and pushing RW too far away from her; assist to maintain balance, complete turning (pt attempting to sit prematurely)    Ambulation / Gait / Stairs / Wheelchair Mobility   not assessed at this time, anticipate needs.    Posture / Balance Balance Overall balance assessment: Needs assistance Sitting-balance support: Bilateral upper extremity supported, Feet supported Sitting balance-Leahy Scale: Poor (reported dizziness for several minutes and then improved) Standing balance support: Bilateral upper extremity supported Standing balance-Leahy Scale: Poor    Special needs/care consideration BiPAP/CPAP no  CPM no  Continuous Drip IV no  Dialysis no         Life Vest no  Oxygen no  Special Bed no  Trach Size no  Wound Vac (area) no       Skin - current R BKA incision, some rashes/scrapes on her back (possibly from scratching per husband)                               Bowel mgmt: last BM on 05-23-14 Bladder mgmt: using bedpan or BSC with assist Diabetic mgmt - yes, managed at home with meds  Per epic: Patient is a [name] refugee and speaks a dialect of Arabic, she has little social support  though she has apparently been able to obtains meds through the [facility] program. Her husband was in [place] and has now returned to [place] to be with her.    Previous Home Environment Living Arrangements: Spouse/significant other (husband) Available Help at Discharge: Family, Available 24 hours/day (husband is no longer working) Type of Home: [place]: Two level (Bathroom is upstairs) Alternate Level  Stairs-Number of Steps: 10 Home Access: Level entry Bathroom Shower/Tub: Tub/shower unit, Architectural technologist: Standard Home Care Services: No Additional Comments: Lives in [place]. - Has a bathtub   Discharge Living Setting Plans for Discharge Living Setting: Patient's home, Apartment (town home) Type of Home at Discharge: Apartment Discharge Home Layout: Two level, Bed/bath upstairs Alternate Level Stairs-Number of Steps: flight of steps (10-15 steps up to second floor) Discharge Home Access: Level entry Discharge Bathroom Shower/Tub: Tub/shower unit Does the patient have any problems obtaining your medications?: Yes (Describe) (pt usually takes the bus. 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) Contact Information: husband is primary contact (does not speak [place]) but neighbor friend Optician, dispensing does speak English/Arabic  Anticipated Caregiver: husband and supportive neighbor/family Anticipated Ambulance person Information: see above Ability/Limitations of Caregiver: no limitations (husband is no longer living in [place]) Caregiver Availability: 24/7 Discharge Plan Discussed with Primary Caregiver: Yes (discussed w/ pt, husband & neighbor with Optometrist) Is Caregiver In Agreement with  Plan?: Yes Does Caregiver/Family have Issues with Lodging/Transportation while Pt is in Rehab?: No  Goals/Additional Needs Patient/Family Goal for Rehab: Mod ind and supervision with PT/OT; NA for SLP Expected length of stay: 8-12 days Cultural Considerations: pt speaks Arabic and very little [place]. Translator has been scheduled to assist with communication. Dietary Needs: heart healthy Equipment Needs: to be determined Special Service Needs: pt will need translator due to speaking very limited English Pt/Family Agrees to Admission and willing to participate:  Yes Program Orientation Provided & Reviewed with Pt/Caregiver Including Roles  & Responsibilities: Yes   Decrease burden of Care through IP rehab admission: NA   Possible need for SNF placement upon discharge: not anticipated   Patient Condition: This patient's condition remains as documented in the consult dated 05-26-14, in which the Rehabilitation Physician determined and documented that the patient's condition is appropriate for intensive rehabilitative care in an inpatient rehabilitation facility. Will admit to inpatient rehab today.  Preadmission Screen Completed By: Nanetta Batty, PT, 05/26/2014 2:36 PM ______________________________________________________________________   Discussed status with Dr. Naaman Plummer on 05-26-14 at 1436 and received telephone approval for admission today.  Admission Coordinator:  Nanetta Batty, PT, time 1436/Date 05-26-14

## 2014-05-26 NOTE — Progress Notes (Signed)
Patient ID: Kimberly Lane, female   DOB: [birth year], 54 y.o.   MRN: [id number] Vascular Surgery Progress Note  Subjective: 3 days post right BKA for gangrene and non-reconstructable tibial occlusive disease  Objective:  Filed Vitals:   05/26/14 0416  BP: 144/69  Pulse: 83  Temp: 99.1 F (37.3 C)  Resp: 18    Patient alert and oriented Right BKA stump looks good with no evidence of infection drainage or cellulitis. Stump rewrapped She is beginning to let knee straighten   Labs:  Recent Labs Lab 05/24/14 0455 05/25/14 0503 05/26/14 0438  CREATININE 0.52 0.50 0.53    Recent Labs Lab 05/24/14 0455 05/25/14 0503 05/26/14 0438  NA 134* 135 136  K 3.3* 3.3* 3.5  CL 98 97 97  CO2 31 29 28   BUN <5* <5* <5*  CREATININE 0.52 0.50 0.53  GLUCOSE 283* 218* 178*  CALCIUM 8.7 8.6 8.6    Recent Labs Lab 05/24/14 0455 05/25/14 0503 05/26/14 0438  WBC 15.0* 12.3* 12.5*  HGB 9.2* 8.7* 8.7*  HCT 28.3* 27.4* 27.2*  PLT 421* 412* 414*   No results for input(s): INR in the last 168 hours.  I/O last 3 completed shifts: In: -  Out: 1585 [Urine:1585]  Imaging: No results found.  Assessment/Plan:  POD #3  LOS: 12 days  s/p Procedure(s): AMPUTATION BELOW KNEE  Doing well post right BKA Agree with skilled nursing facility versus CIR I will follow-up with patient in my office on May 3   Tinnie Gens, MD 05/26/2014 8:55 AM

## 2014-05-27 ENCOUNTER — Inpatient Hospital Stay (HOSPITAL_COMMUNITY): Payer: Self-pay | Admitting: Occupational Therapy

## 2014-05-27 ENCOUNTER — Inpatient Hospital Stay (HOSPITAL_COMMUNITY): Payer: No Typology Code available for payment source | Admitting: Occupational Therapy

## 2014-05-27 ENCOUNTER — Inpatient Hospital Stay (HOSPITAL_COMMUNITY): Payer: Self-pay

## 2014-05-27 ENCOUNTER — Inpatient Hospital Stay (HOSPITAL_COMMUNITY): Payer: Self-pay | Admitting: Physical Therapy

## 2014-05-27 DIAGNOSIS — R05 Cough: Secondary | ICD-10-CM | POA: Diagnosis not present

## 2014-05-27 DIAGNOSIS — Z89511 Acquired absence of right leg below knee: Secondary | ICD-10-CM | POA: Diagnosis not present

## 2014-05-27 DIAGNOSIS — E1142 Type 2 diabetes mellitus with diabetic polyneuropathy: Secondary | ICD-10-CM

## 2014-05-27 DIAGNOSIS — J9811 Atelectasis: Secondary | ICD-10-CM | POA: Diagnosis not present

## 2014-05-27 DIAGNOSIS — G629 Polyneuropathy, unspecified: Secondary | ICD-10-CM

## 2014-05-27 DIAGNOSIS — R059 Cough, unspecified: Secondary | ICD-10-CM | POA: Insufficient documentation

## 2014-05-27 LAB — CBC WITH DIFFERENTIAL/PLATELET
BASOS ABS: 0.1 10*3/uL (ref 0.0–0.1)
Basophils Relative: 1 % (ref 0–1)
EOS ABS: 0.1 10*3/uL (ref 0.0–0.7)
Eosinophils Relative: 1 % (ref 0–5)
HEMATOCRIT: 28.5 % — AB (ref 36.0–46.0)
HEMOGLOBIN: 9.5 g/dL — AB (ref 12.0–15.0)
Lymphocytes Relative: 26 % (ref 12–46)
Lymphs Abs: 2.8 10*3/uL (ref 0.7–4.0)
MCH: 26.5 pg (ref 26.0–34.0)
MCHC: 33.3 g/dL (ref 30.0–36.0)
MCV: 79.4 fL (ref 78.0–100.0)
MONO ABS: 0.7 10*3/uL (ref 0.1–1.0)
MONOS PCT: 7 % (ref 3–12)
Neutro Abs: 7 10*3/uL (ref 1.7–7.7)
Neutrophils Relative %: 66 % (ref 43–77)
Platelets: 436 10*3/uL — ABNORMAL HIGH (ref 150–400)
RBC: 3.59 MIL/uL — ABNORMAL LOW (ref 3.87–5.11)
RDW: 13.2 % (ref 11.5–15.5)
WBC: 10.7 10*3/uL — ABNORMAL HIGH (ref 4.0–10.5)

## 2014-05-27 LAB — COMPREHENSIVE METABOLIC PANEL
ALT: 18 U/L (ref 0–35)
AST: 21 U/L (ref 0–37)
Albumin: 2.5 g/dL — ABNORMAL LOW (ref 3.5–5.2)
Alkaline Phosphatase: 72 U/L (ref 39–117)
Anion gap: 7 (ref 5–15)
CALCIUM: 8.9 mg/dL (ref 8.4–10.5)
CHLORIDE: 100 mmol/L (ref 96–112)
CO2: 32 mmol/L (ref 19–32)
Creatinine, Ser: 0.5 mg/dL (ref 0.50–1.10)
GFR calc Af Amer: >90 mL/min (ref >90)
Glucose, Bld: 300 mg/dL — ABNORMAL HIGH (ref 70–99)
Potassium: 3.7 mmol/L (ref 3.5–5.1)
Sodium: 139 mmol/L (ref 135–145)
Total Bilirubin: 0.4 mg/dL (ref 0.3–1.2)
Total Protein: 7.4 g/dL (ref 6.0–8.3)

## 2014-05-27 LAB — URINALYSIS, ROUTINE W REFLEX MICROSCOPIC
BILIRUBIN URINE: NEGATIVE
GLUCOSE, UA: 100 mg/dL — AB
HGB URINE DIPSTICK: NEGATIVE
Ketones, ur: NEGATIVE mg/dL
Leukocytes, UA: NEGATIVE
Nitrite: NEGATIVE
PROTEIN: NEGATIVE mg/dL
Specific Gravity, Urine: 1.015 (ref 1.005–1.030)
Urobilinogen, UA: 1 mg/dL (ref 0.0–1.0)
pH: 7 (ref 5.0–8.0)

## 2014-05-27 LAB — GLUCOSE, CAPILLARY
GLUCOSE-CAPILLARY: 270 mg/dL — AB (ref 70–99)
Glucose-Capillary: 192 mg/dL — ABNORMAL HIGH (ref 70–99)
Glucose-Capillary: 165 mg/dL — ABNORMAL HIGH (ref 70–99)
Glucose-Capillary: 196 mg/dL — ABNORMAL HIGH (ref 70–99)

## 2014-05-27 NOTE — Progress Notes (Signed)
[facility] Admission Coordinator Signed Physical Medicine and Rehabilitation PMR Pre-admission 05/26/2014 2:36 PM  Related encounter: Admission (Discharged) from 05/13/2014 in [place] Collapse All   PMR Admission Coordinator Pre-Admission Assessment  Patient: Kimberly Lane is an 54 y.o., female MRN: [id number] DOB: 1960/08/11 Height: 5' (152.4 cm) (unable to tell,pt Education officer, museum reported) Weight: 58.9 kg (129 lb 13.6 oz)  Insurance Information Pt is self pay. Husband reports that the Medicaid/disability application has been started.  Emergency Contact Information Contact Information    Name Relation Home Work Mobile   Abdulla,Juma Spouse [phone]  [phone]   Aron Baba   [phone]   Sarina Ser [phone]     Arvella Nigh Other [phone]       Current Medical History  Patient Admitting Diagnosis: Right BKA  History of Present Illness: Kimberly Lane is a 54 y.o. right handed limited English speaking refugee female with history of hypertension, diabetes mellitus with peripheral neuropathy. Presented 05/20/2014 with right fifth toe wound that occurred approximately 2 weeks ago after she hit her toe against a chair at home. Patient lives with her husband had been using a wheelchair recently due to right foot wound. The pain has progressively worsened with cellulitic changes. X-rays show no evidence of osteomyelitis. Noted soft tissue swelling. MRI of the right foot again showed soft tissue laceration involving the fifth toe. Patient with progressive ischemic changes. 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 Limb was not felt to be salvageable and underwent right BKA 05/23/2014 per Dr. Kellie Simmering.  Hospital course pain management. Acute blood loss anemia 8.7 and monitored. Bouts of urinary retention with need for intermittent catheterizations volume 600 mL. Subcutaneous heparin for DVT prophylaxis. Physical occupational therapy evaluation completed 05/24/2014 with recommendations of physical medicine rehabilitation consult.   Past Medical History  Past Medical History  Diagnosis Date  . Diabetes mellitus   . Hypertension   . Cellulitis and abscess of foot 05/13/2014    rt foot  . 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  Prior Rehab/Hospitalizations: none  Current Medications   Current facility-administered medications:  . 0.9 % sodium chloride infusion, , Intravenous, Continuous, Serafina Mitchell, MD, Stopped at 05/21/14 1400 . acetaminophen (TYLENOL) tablet 650 mg, 650 mg, Oral, Q6H PRN, 650 mg at 05/25/14 2246 **OR** acetaminophen (TYLENOL) suppository 650 mg, 650 mg, Rectal, Q6H PRN, Elberta Leatherwood, MD . alum & mag hydroxide-simeth (MAALOX/MYLANTA) 200-200-20 MG/5ML suspension 15-30 mL, 15-30 mL, Oral, Q2H PRN, Serafina Mitchell, MD . amLODipine (NORVASC) tablet 10 mg, 10 mg, Oral, Daily, Aquilla Hacker, MD, 10 mg at 05/25/14 0959 . aspirin EC tablet 81 mg, 81 mg, Oral, Daily, Rosemarie Ax, MD, 81 mg at 05/25/14 1150 . atorvastatin (LIPITOR) tablet 40 mg, 40 mg, Oral, Daily, Aquilla Hacker, MD, 40 mg at 05/25/14 1000 . docusate sodium (COLACE) capsule 100 mg, 100 mg, Oral, BID, Vivi Barrack, MD, 100 mg at 05/25/14 2246 . guaiFENesin-dextromethorphan (ROBITUSSIN DM) 100-10 MG/5ML syrup 15 mL, 15 mL, Oral, Q4H PRN, Serafina Mitchell, MD . heparin injection 5,000 Units, 5,000 Units, Subcutaneous, 3 times per day, Elberta Leatherwood, MD, 5,000 Units at 05/26/14 0540 . insulin aspart (novoLOG) injection 0-15 Units, 0-15 Units, Subcutaneous, TID WC, Aquilla Hacker, MD, 8 Units  at 05/26/14 1341 . insulin aspart (novoLOG) injection 0-5 Units, 0-5 Units, Subcutaneous, QHS,  Patrecia Pour, MD, 3 Units at 05/23/14 2204 . insulin glargine (LANTUS) injection 20 Units, 20 Units, Subcutaneous, Daily, Vivi Barrack, MD, 20 Units at 05/26/14 1204 . labetalol (NORMODYNE,TRANDATE) injection 10 mg, 10 mg, Intravenous, Q2H PRN, Patrecia Pour, MD . lactated ringers infusion, , Intravenous, Continuous, Mal Misty, MD, Stopped at 05/23/14 1600 . lactobacillus acidophilus (BACID) tablet 2 tablet, 2 tablet, Oral, TID, Elberta Leatherwood, MD, 2 tablet at 05/25/14 2246 . lisinopril (PRINIVIL,ZESTRIL) tablet 20 mg, 20 mg, Oral, Daily, Aquilla Hacker, MD, 20 mg at 05/25/14 1000 . morphine 4 MG/ML injection 4 mg, 4 mg, Intravenous, Q2H PRN, Vivi Barrack, MD, 4 mg at 05/26/14 0854 . ondansetron (ZOFRAN) injection 4 mg, 4 mg, Intravenous, Q6H PRN, Samantha J Rhyne, PA-C, 4 mg at 05/26/14 0502 . oxyCODONE (Oxy IR/ROXICODONE) immediate release tablet 5 mg, 5 mg, Oral, Q6H, Vivi Barrack, MD, 5 mg at 05/26/14 0445 . oxyCODONE (Oxy IR/ROXICODONE) immediate release tablet 5 mg, 5 mg, Oral, Q4H PRN, Vivi Barrack, MD, 5 mg at 05/25/14 2034 . pantoprazole (PROTONIX) EC tablet 40 mg, 40 mg, Oral, Daily, Samantha J Rhyne, PA-C, 40 mg at 05/25/14 1000 . phenol (CHLORASEPTIC) mouth spray 1 spray, 1 spray, Mouth/Throat, PRN, Serafina Mitchell, MD . polyethylene glycol (MIRALAX / GLYCOLAX) packet 17 g, 17 g, Oral, Daily, Vivi Barrack, MD, 17 g at 05/25/14 1150 . polyethylene glycol (MIRALAX / GLYCOLAX) packet 17-34 g, 17-34 g, Oral, Daily PRN, Vivi Barrack, MD . potassium chloride SA (K-DUR,KLOR-CON) CR tablet 20-40 mEq, 20-40 mEq, Oral, Daily PRN, Samantha J Rhyne, PA-C . sodium chloride 0.9 % injection 3 mL, 3 mL, Intravenous, PRN, Elberta Leatherwood, MD  Patients Current Diet: Diet heart healthy/carb modified Room service appropriate?: Yes; Fluid consistency:: Thin  Precautions /  Restrictions Precautions Precautions: Fall Restrictions Weight Bearing Restrictions: Yes RLE Weight Bearing: Weight bearing as tolerated   Prior Activity Level Community (5-7x/wk): Pt states she got out everyday (taking the bus) and took classes at [employer] for [place] as a second language. She enjoys visiting with friends and going to the community center.    Home Assistive Devices / Equipment Home Assistive Devices/Equipment: None Home Equipment: Wheelchair - manual  Prior Functional Level Prior Function Level of Independence: Needs assistance Gait / Transfers Assistance Needed: Uses a wheelchair for mobility at home ADL's / Homemaking Assistance Needed: Extended friends and family were assisting with ADLs   Current Functional Level Cognition  Overall Cognitive Status: Within Functional Limits for tasks assessed Difficult to assess due to: Non-English speaking (Answering questions appropriately via interpreter.) Orientation Level: Oriented to person, Oriented to place, Oriented to time   Extremity Assessment (includes Sensation/Coordination)  Upper Extremity Assessment: Generalized weakness  Lower Extremity Assessment: Defer to PT evaluation RLE Deficits / Details: Guarded due to surgery. Able to move slowly. Minimal knee extension.    ADLs  Overall ADL's : Needs assistance/impaired Eating/Feeding: Independent Grooming: Wash/dry face, Wash/dry hands, Oral care, Brushing hair, Set up, Sitting, Bed level Upper Body Bathing: Set up, Sitting, Bed level Lower Body Bathing: Moderate assistance, Bed level, Sit to/from stand Upper Body Dressing : Minimal assistance, Sitting Lower Body Dressing: Total assistance, Sit to/from stand Toilet Transfer: Moderate assistance, +2 for physical assistance, Stand-pivot, RW, BSC Toileting- Clothing Manipulation and Hygiene: Total assistance, Sit to/from stand Functional mobility during ADLs: Moderate assistance, +2 for physical assistance,  Rolling walker General ADL Comments: Pt moves slowly due to pain. Spouse present and very supportive  Mobility  Overal bed mobility: Needs Assistance, +2 for physical assistance Bed Mobility: Supine to Sit Supine to sit: HOB elevated, Mod assist General bed mobility comments: Pt preferred husband assist her (although ultimately needed PT for sequencing/technique); pt with difficulty pivoting to EOB    Transfers  Overall transfer level: Needs assistance Equipment used: Rolling walker (2 wheeled) Transfers: Sit to/from Stand, Stand Pivot Transfers Sit to Stand: Mod assist, +2 physical assistance Stand pivot transfers: +2 physical assistance, Max assist General transfer comment: Stood twice with RW requiring significant boost during first 1/2 and then pt able to contribute much more to upright standing. During pivot pt unable to fully unweight LLE to "hop" and pushing RW too far away from her; assist to maintain balance, complete turning (pt attempting to sit prematurely)    Ambulation / Gait / Stairs / Wheelchair Mobility   not assessed at this time, anticipate needs.    Posture / Balance Balance Overall balance assessment: Needs assistance Sitting-balance support: Bilateral upper extremity supported, Feet supported Sitting balance-Leahy Scale: Poor (reported dizziness for several minutes and then improved) Standing balance support: Bilateral upper extremity supported Standing balance-Leahy Scale: Poor    Special needs/care consideration BiPAP/CPAP no  CPM no  Continuous Drip IV no  Dialysis no  Life Vest no  Oxygen no  Special Bed no  Trach Size no  Wound Vac (area) no  Skin - current R BKA incision, some rashes/scrapes on her back (possibly from scratching per husband)  Bowel mgmt: last BM on 05-23-14 Bladder mgmt: using bedpan or BSC with assist Diabetic mgmt - yes, managed at home with meds  Per epic:  Patient is a [name] refugee and speaks a dialect of Arabic, she has little social support though she has apparently been able to obtains meds through the [facility] program. Her husband was in [place] and has now returned to [place] to be with her.    Previous Home Environment Living Arrangements: Spouse/significant other (husband) Available Help at Discharge: Family, Available 24 hours/day (husband is no longer working) Type of Home: [place]: Two level (Bathroom is upstairs) Alternate Level Stairs-Number of Steps: 10 Home Access: Level entry Bathroom Shower/Tub: Tub/shower unit, Architectural technologist: Standard Home Care Services: No Additional Comments: Lives in [place]. - Has a bathtub   Discharge Living Setting Plans for Discharge Living Setting: Patient's home, Apartment (town home) Type of Home at Discharge: Apartment Discharge Home Layout: Two level, Bed/bath upstairs Alternate Level Stairs-Number of Steps: flight of steps (10-15 steps up to second floor) Discharge Home Access: Level entry Discharge Bathroom Shower/Tub: Tub/shower unit Does the patient have any problems obtaining your medications?: Yes (Describe) (pt usually takes the bus. 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) Contact Information: husband is primary contact (does not speak [place]) but neighbor friend Optician, dispensing does speak English/Arabic  Anticipated Caregiver: husband and supportive neighbor/family Anticipated Ambulance person Information: see above Ability/Limitations of Caregiver: no limitations (husband is no longer living in [place]) Caregiver Availability: 24/7 Discharge Plan Discussed with Primary Caregiver: Yes (discussed w/ pt, husband & neighbor with Optometrist) Is Caregiver In Agreement with Plan?: Yes Does Caregiver/Family have Issues with  Lodging/Transportation while Pt is in Rehab?: No  Goals/Additional Needs Patient/Family Goal for Rehab: Mod ind and supervision with PT/OT; NA for SLP Expected length of stay: 8-12 days Cultural Considerations: pt speaks Arabic and very little [place]. Translator  has been scheduled to assist with communication. Dietary Needs: heart healthy Equipment Needs: to be determined Special Service Needs: pt will need translator due to speaking very limited English Pt/Family Agrees to Admission and willing to participate: Yes Program Orientation Provided & Reviewed with Pt/Caregiver Including Roles & Responsibilities: Yes   Decrease burden of Care through IP rehab admission: NA   Possible need for SNF placement upon discharge: not anticipated   Patient Condition: This patient's condition remains as documented in the consult dated 05-26-14, in which the Rehabilitation Physician determined and documented that the patient's condition is appropriate for intensive rehabilitative care in an inpatient rehabilitation facility. Will admit to inpatient rehab today.  Preadmission Screen Completed By: Nanetta Batty, PT, 05/26/2014 2:36 PM ______________________________________________________________________  Discussed status with Dr. Naaman Plummer on 05-26-14 at 1436 and received telephone approval for admission today.  Admission Coordinator: Nanetta Batty, PT, time 1436/Date 05-26-14          Cosigned by: Meredith Staggers, MD at 05/26/2014 2:44 PM  Revision History     Date/Time User Provider Type Action   05/26/2014 2:44 PM Meredith Staggers, MD Physician Cosign   05/26/2014 2:42 PM [facility] Admission Coordinator Sign

## 2014-05-27 NOTE — Progress Notes (Signed)
Patient information reviewed and entered into eRehab system by Shemuel Harkleroad, RN, CRRN, PPS Coordinator.  Information including medical coding and functional independence measure will be reviewed and updated through discharge.    

## 2014-05-27 NOTE — Progress Notes (Addendum)
54 y.o. right handed limited English speaking refugee female with history of hypertension, diabetes mellitus with peripheral neuropathy. Presented 05/20/2014 with right fifth toe wound that occurred approximately 2 weeks ago after she hit her toe against a chair at home. Patient lives with her husband had been using a wheelchair recently due to right foot wound. The pain has progressively worsened with cellulitic changes. X-rays show no evidence of osteomyelitis. Noted soft tissue swelling. MRI of the right foot again showed soft tissue laceration involving the fifth toe. Patient with progressive ischemic changes. 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 Limb was not felt to be salvageable and underwent right BKA 05/23/2014 per Dr. Kellie Simmering. Hospital course pain management. Acute blood loss anemia 8.7 and monitored. Follow-up prosthetic care with [facility] prosthetics  Subjective/Complaints: Nsg reports cough, pt appears tirted, per husband slept swell, limited English ROS: cannot obtain language  Objective: Vital Signs: Blood pressure 149/62, pulse 84, temperature 99 F (37.2 C), temperature source Oral, resp. rate 18, weight 54.7 kg (120 lb 9.5 oz), SpO2 100 %. No results found. Results for orders placed or performed during the hospital encounter of 05/26/14 (from the past 72 hour(s))  Glucose, capillary     Status: Abnormal   Collection Time: 05/26/14  9:49 PM  Result Value Ref Range   Glucose-Capillary 297 (H) 70 - 99 mg/dL  Glucose, capillary     Status: Abnormal   Collection Time: 05/27/14  6:36 AM  Result Value Ref Range   Glucose-Capillary 270 (H) 70 - 99 mg/dL     HEENT: normal Cardio: RRR Resp: CTA B/L GI: BS positive Extremity:  Edema right BK stump Skin:   Wound C/D/I and Right BKA Neuro: Lethargic and Abnormal Motor 4/5 in BUE and LLE, R HF Musc/Skel:  Other short Right BKA Gen NAD   Assessment/Plan: 1. Functional deficits secondary to Right BKA  which require 3+ hours per day of interdisciplinary therapy in a comprehensive inpatient rehab setting. Physiatrist is providing close team supervision and 24 hour management of active medical problems listed below. Physiatrist and rehab team continue to assess barriers to discharge/monitor patient progress toward functional and medical goals. FIM:             FIM - Control and instrumentation engineer Devices: Bed rails Bed/Chair Transfer: 1: Two helpers     Comprehension Comprehension Mode: Not assessed  Expression Expression Mode: Not assessed  Social Interaction Social Interaction Mode: Not assessed  Problem Solving Problem Solving Mode: Not assessed  Memory Memory Mode: Not assessed  Medical Problem List and Plan: 1. Functional deficits secondary to right BKA secondary to PVD 2. DVT Prophylaxis/Anticoagulation: Subcutaneous heparin. Monitor platelet counts for any signs of bleeding 3. Pain Management: Oxycodone as needed. Monitor with increased mobility 4. Acute blood loss anemia. Follow-up CBC 5. Neuropsych: This patient is capable of making decisions on her own behalf. 6. Skin/Wound Care: Routine skin checks 7. Fluids/Electrolytes/Nutrition: Strict I and O follow-up chemistries 8. Diabetes mellitus and peripheral neuropathy. Hemoglobin A1c 9.6. Lantus insulin 20 units daily. Check blood sugars before meals and at bedtime 9. Hypertension. Lisinopril 20 mg daily, Norvasc 10 mg daily. Monitor with increased mobility 10. Hyperlipidemia. Lipitor2 11. Constipation. Adjust bowel program 12.  Leukocytosis, low grade temp, check CXR and UA LOS (Days) 1 A FACE TO FACE EVALUATION WAS PERFORMED  Kimberly Lane E 05/27/2014, 8:01 AM

## 2014-05-27 NOTE — Progress Notes (Signed)
Occupational Therapy Session Note  Patient Details  Name: Kimberly Lane MRN: [id number] Date of Birth: 10/22/1960  Today's Date: 05/27/2014 OT Individual Time: 1105-1205 OT Individual Time Calculation (min): 60 min    Short Term Goals: Week 1:  OT Short Term Goal 1 (Week 1): STG = LTGs due to ELOS  Skilled Therapeutic Interventions/Progress Updates:  Treatment session with focus on w/c mobility and transfers from w/c level and with RW.  Pt propelled w/c to ADL apt with mod cues and hand over hand assist for proper technique with w/c propulsion.  Educated on tub transfer technique with use of tub transfer bench and RW, as pt's bathroom is upstairs and may not be w/c accessible.  Pt will most likely have to perform sponge bath on main floor and require BSC for toileting at d/c.  Pt required mod multimodal cues for safety and sequencing during transfers with RW.  Pt reports light headedness, vitals assessed and provided rest break.  Pt returned to room as above and performed squat pivot transfer back to bed with mod assist.  Pt's husband and interpretor present throughout session.  Therapy Documentation Precautions:  Precautions Precautions: Fall Precaution Comments: Painful R shoulder. Restrictions Weight Bearing Restrictions: Yes RLE Weight Bearing: Non weight bearing Pain: Pain Assessment Pain Assessment: Faces Faces Pain Scale: Hurts little more Pain Type: Surgical pain Pain Location: Leg Pain Orientation: Right Pain Descriptors / Indicators: Sore Pain Onset: On-going Pain Intervention(s): Repositioned (premedicated)  See FIM for current functional status  Therapy/Group: Individual Therapy  Simonne Come 05/27/2014, 12:36 PM

## 2014-05-27 NOTE — Progress Notes (Signed)
Occupational Therapy Session Note  Patient Details  Name: Kimberly Lane MRN: [id number] Date of Birth: [birth year]  Today's Date: 05/27/2014 OT Individual Time: 1445-1515 OT Individual Time Calculation (min): 30 min    Short Term Goals: Week 1:  OT Short Term Goal 1 (Week 1): STG = LTGs due to ELOS  Skilled Therapeutic Interventions/Progress Updates:    Pt seen for OT therapy focusing on functional transfers and functional sitting balance. Pt in supine in bed upon arrival, agreeable to tx. Pt transferred supine > EOB with tactile cues for sequencing. She transferred EOB>w/c with max A. Pt self-propelled w/c to therapy gym in order to increase functional endurance and UE strength. Pt required min A for w/c management. In therapy gym, pt transferred w/c <> therapy mat with max assist. Seated unsupported on mat, pt completed bean bag toss and ball toss with therapist. Pt able to tolerate ~20 minutes of dynamic unsupported sitting without LOB, demonstrating functional sitting balance. At end of session, pt self-propelled w/c back to room with supervision. She transferred w/c> bed with max A and tactile cues for technique. Pt completed all bed mobility with supervision. Pt left sitting up in bed at end of session, all needs in reach, and bed alarm on.   Session occurred without translator present. Pt unable to speak English limiting education able to be provided. Pt able to follow visual and tactile cues/ directions well, and able to fully participate in therapy session.  Therapy Documentation Precautions:  Precautions Precautions: Fall Precaution Comments: Painful R shoulder. Restrictions Weight Bearing Restrictions: Yes RLE Weight Bearing: Non weight bearing Pain: Pain Assessment Pain Assessment: No/denies pain  See FIM for current functional status  Therapy/Group: Individual Therapy  Lewis, Keishawn Darsey C 05/27/2014, 3:22 PM

## 2014-05-27 NOTE — Progress Notes (Signed)
Meredith Staggers, MD Physician Signed Physical Medicine and Rehabilitation Consult Note 05/26/2014 5:49 AM  Related encounter: Admission (Discharged) from 05/13/2014 in [place] [facility]        Physical Medicine and Rehabilitation Consult Reason for Consult: Right BKA Referring Physician: [facility]   HPI: Kimberly Lane is a 54 y.o. right handed limited English speaking refugee female with history of hypertension, diabetes mellitus with peripheral neuropathy. Presented 05/20/2014 with right fifth toe wound that occurred approximately 2 weeks ago after she hit her toe against a chair at home. Patient lives with her husband had been using a wheelchair recently due to right foot wound. The pain has progressively worsened with cellulitic changes. X-rays show no evidence of osteomyelitis. Noted soft tissue swelling. MRI of the right foot again showed soft tissue laceration involving the fifth toe. Patient with progressive ischemic changes. 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 Limb was not felt to be salvageable and underwent right BKA 05/23/2014 per Dr. Kellie Simmering. Hospital course pain management. Acute blood loss anemia 8.7 and monitored. Bouts of urinary retention with need for intermittent catheterizations volume 600 mL. Subcutaneous heparin for DVT prophylaxis. Physical occupational therapy evaluation completed 05/24/2014 with recommendations of physical medicine rehabilitation consult.   Review of Systems  Gastrointestinal: Positive for constipation.  Musculoskeletal: Positive for myalgias and joint pain.  Neurological: Positive for weakness.   Past Medical History  Diagnosis Date  . Diabetes mellitus   . Hypertension   . Cellulitis and abscess of foot 05/13/2014    rt foot  . 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 Abdominal aortagram N/A 05/20/2014      Procedure: ABDOMINAL Maxcine Ham; Surgeon: Serafina Mitchell, MD; Location: [facility] CATH LAB; Service: Cardiovascular; Laterality: N/A;   Family History  Problem Relation Age of Onset  . Heart disease      No family history   Social History:  reports that she has never smoked. She has never used smokeless tobacco. She reports that she does not drink alcohol or use illicit drugs. Allergies:  Allergies  Allergen Reactions  . Ace Inhibitors     cough  . Bactrim [Sulfamethoxazole-Trimethoprim] Nausea And Vomiting    Pt and family verified that pt takes this medication   Medications Prior to Admission  Medication Sig Dispense Refill  . acetaminophen (TYLENOL) 500 MG tablet Take 1 tablet (500 mg total) by mouth every 6 (six) hours as needed. (Patient taking differently: Take 500 mg by mouth every 6 (six) hours as needed for mild pain. ) 30 tablet 0  . amLODipine (NORVASC) 5 MG tablet Take 1 tablet (5 mg total) by mouth daily. 90 tablet 3  . diclofenac sodium (VOLTAREN) 1 % GEL Apply 4 g topically 4 (four) times daily. For shoulder pain 3 Tube 0  . glimepiride (AMARYL) 2 MG tablet Take 2 mg by mouth daily with breakfast.    . HYDROcodone-acetaminophen (NORCO/VICODIN) 5-325 MG per tablet Take 1-2 tablets by mouth every 4 (four) hours as needed. 12 tablet 0  . lisinopril (PRINIVIL,ZESTRIL) 40 MG tablet Take 40 mg by mouth daily.    . metFORMIN (GLUCOPHAGE) 1000 MG tablet Take 1 tablet (1,000 mg total) by mouth 2 (two) times daily with a meal. 60 tablet 11  . simvastatin (ZOCOR) 20 MG tablet Take 40 mg by mouth daily.    Marland Kitchen sulfamethoxazole-trimethoprim (BACTRIM DS,SEPTRA DS) 800-160 MG per tablet Take  1 tablet by mouth 2 (two) times daily.    Marland Kitchen aspirin 81 MG tablet Take 1 tablet (81 mg total) by mouth daily. (Patient not taking: Reported on 05/13/2014) 100 tablet 0    Home: Home Living Family/patient expects to  be discharged to:: Private residence Living Arrangements: Spouse/significant other (husband) Available Help at Discharge: Family, Available 24 hours/day (husband is no longer working) Type of Home: Apartment Home Access: Level entry Home Layout: Two level (Bathroom is upstairs) Alternate Level Stairs-Number of Steps: 10 Home Equipment: Wheelchair - manual Additional Comments: Lives in [place]. - Has a bathtub   Functional History: Prior Function Level of Independence: Needs assistance Gait / Transfers Assistance Needed: Uses a wheelchair for mobility at home ADL's / Homemaking Assistance Needed: Extended friends and family were assisting with ADLs  Functional Status:  Mobility: Bed Mobility Overal bed mobility: Needs Assistance, +2 for physical assistance Bed Mobility: Supine to Sit Supine to sit: Min assist, HOB elevated General bed mobility comments: Min assist for initial trunk support, then tolerated unsupported sitting. Min assist for scoot with bed pad to rotate to edge of bed. Tactile cues for directions. Transfers Overall transfer level: Needs assistance Equipment used: Rolling walker (2 wheeled) Transfers: Sit to/from Stand, [employer] Transfers Sit to Stand: Mod assist, +2 physical assistance Stand pivot transfers: Mod assist, +2 physical assistance General transfer comment: Mod assist for boost and balance with cues for technique from lowest bed setting, tactile cues for hand placement. Required Rt knee block initially however progressed and was able to pivot with small steps using the RW for support. Mod assist +2 for balance and walker placement. VC for technique via interpreter throughout. No buckling but attempts to sit prematurely requiring additional cues for upright posture.      ADL: ADL Overall ADL's : Needs assistance/impaired Eating/Feeding: Independent Grooming: Wash/dry face, Wash/dry hands, Oral care, Brushing hair, Set up, Sitting, Bed level Upper  Body Bathing: Set up, Sitting, Bed level Lower Body Bathing: Moderate assistance, Bed level, Sit to/from stand Upper Body Dressing : Minimal assistance, Sitting Lower Body Dressing: Total assistance, Sit to/from stand Toilet Transfer: Moderate assistance, +2 for physical assistance, Stand-pivot, RW, BSC Toileting- Clothing Manipulation and Hygiene: Total assistance, Sit to/from stand Functional mobility during ADLs: Moderate assistance, +2 for physical assistance, Rolling walker General ADL Comments: Pt moves slowly due to pain. Spouse present and very supportive  Cognition: Cognition Overall Cognitive Status: Difficult to assess Orientation Level: Oriented to person, Oriented to place, Oriented to time Cognition Arousal/Alertness: Awake/alert Behavior During Therapy: WFL for tasks assessed/performed Overall Cognitive Status: Difficult to assess Difficult to assess due to: Non-English speaking (Answering questions appropriately via interpreter.)  Blood pressure 144/69, pulse 83, temperature 99.1 F (37.3 C), temperature source Oral, resp. rate 18, height 5' (1.524 m), weight 58.9 kg (129 lb 13.6 oz), SpO2 97 %. Physical Exam  Vitals reviewed. HENT:  Head: Normocephalic.  Eyes: EOM are normal.  Neck: Normal range of motion. Neck supple. No thyromegaly present.  Cardiovascular: Normal rate and regular rhythm.  Respiratory: Effort normal and breath sounds normal. No respiratory distress.  GI: Soft. Bowel sounds are normal. She exhibits no distension.  Neurological: She is alert.  Limited English-speaking. She did follow simple demonstrated commands. Answers simple yes no questions.  Skin:  Right BKA site is dressed appropriately tender     Lab Results Last 24 Hours    Results for orders placed or performed during the hospital encounter of 05/13/14 (from the past 24  hour(s))  Glucose, capillary Status: Abnormal   Collection Time: 05/25/14 6:02 AM  Result Value Ref  Range   Glucose-Capillary 215 (H) 70 - 99 mg/dL  Glucose, capillary Status: Abnormal   Collection Time: 05/25/14 11:36 AM  Result Value Ref Range   Glucose-Capillary 182 (H) 70 - 99 mg/dL  Urinalysis, Routine w reflex microscopic Status: Abnormal   Collection Time: 05/25/14 3:05 PM  Result Value Ref Range   Color, Urine YELLOW YELLOW   APPearance CLEAR CLEAR   Specific Gravity, Urine 1.017 1.005 - 1.030   pH 5.5 5.0 - 8.0   Glucose, UA NEGATIVE NEGATIVE mg/dL   Hgb urine dipstick NEGATIVE NEGATIVE   Bilirubin Urine NEGATIVE NEGATIVE   Ketones, ur 15 (A) NEGATIVE mg/dL   Protein, ur NEGATIVE NEGATIVE mg/dL   Urobilinogen, UA 0.2 0.0 - 1.0 mg/dL   Nitrite NEGATIVE NEGATIVE   Leukocytes, UA NEGATIVE NEGATIVE  Glucose, capillary Status: Abnormal   Collection Time: 05/25/14 4:18 PM  Result Value Ref Range   Glucose-Capillary 160 (H) 70 - 99 mg/dL  Glucose, capillary Status: Abnormal   Collection Time: 05/25/14 9:52 PM  Result Value Ref Range   Glucose-Capillary 174 (H) 70 - 99 mg/dL  CBC Status: Abnormal   Collection Time: 05/26/14 4:38 AM  Result Value Ref Range   WBC 12.5 (H) 4.0 - 10.5 K/uL   RBC 3.41 (L) 3.87 - 5.11 MIL/uL   Hemoglobin 8.7 (L) 12.0 - 15.0 g/dL   HCT 27.2 (L) 36.0 - 46.0 %   MCV 79.8 78.0 - 100.0 fL   MCH 25.5 (L) 26.0 - 34.0 pg   MCHC 32.0 30.0 - 36.0 g/dL   RDW 13.4 11.5 - 15.5 %   Platelets 414 (H) 150 - 400 K/uL  Basic metabolic panel Status: Abnormal   Collection Time: 05/26/14 4:38 AM  Result Value Ref Range   Sodium 136 135 - 145 mmol/L   Potassium 3.5 3.5 - 5.1 mmol/L   Chloride 97 96 - 112 mmol/L   CO2 28 19 - 32 mmol/L   Glucose, Bld 178 (H) 70 - 99 mg/dL   BUN <5 (L) 6 - 23 mg/dL   Creatinine, Ser 0.53 0.50 - 1.10 mg/dL   Calcium 8.6 8.4 - 10.5  mg/dL   GFR calc non Af Amer >90 >90 mL/min   GFR calc Af Amer >90 >90 mL/min   Anion gap 11 5 - 15      Imaging Results (Last 48 hours)    No results found.    Assessment/Plan: Diagnosis: right bka 1. Does the need for close, 24 hr/day medical supervision in concert with the patient's rehab needs make it unreasonable for this patient to be served in a less intensive setting? Yes 2. Co-Morbidities requiring supervision/potential complications: dm2, pain mgt 3. Due to bladder management, bowel management, safety, skin/wound care, disease management and pain management, does the patient require 24 hr/day rehab nursing? Yes 4. Does the patient require coordinated care of a physician, rehab nurse, PT (1-2 hrs/day, 5 days/week) and OT (1-2 hrs/day, 5 days/week) to address physical and functional deficits in the context of the above medical diagnosis(es)? Yes Addressing deficits in the following areas: balance, endurance, locomotion, strength, transferring, bowel/bladder control, bathing, dressing, feeding, grooming, toileting and psychosocial support 5. Can the patient actively participate in an intensive therapy program of at least 3 hrs of therapy per day at least 5 days per week? Yes 6. The potential for patient to make measurable gains while on  inpatient rehab is excellent 7. Anticipated functional outcomes upon discharge from inpatient rehab are modified independent and supervision with PT, modified independent and supervision with OT, n/a with SLP. 8. Estimated rehab length of stay to reach the above functional goals is: 8-12 days 9. Does the patient have adequate social supports and living environment to accommodate these discharge functional goals? Yes 10. Anticipated D/C setting: Home 11. Anticipated post D/C treatments: [facility] therapy 12. Overall Rehab/Functional Prognosis: excellent  RECOMMENDATIONS: This patient's condition is appropriate for continued rehabilitative care  in the following setting: CIR Patient has agreed to participate in recommended program. Potentially Note that insurance prior authorization may be required for reimbursement for recommended care.  Comment: Rehab Admissions Coordinator to follow up.  Thanks,  Meredith Staggers, MD, [facility]     05/26/2014       Revision History     Date/Time User Provider Type Action   05/26/2014 10:22 AM Meredith Staggers, MD Physician Sign   05/26/2014 6:37 AM Cathlyn Parsons, PA-C Physician Assistant Pend   View Details Report       Routing History     Date/Time From To Method   05/26/2014 10:22 AM Meredith Staggers, MD Meredith Staggers, MD In Basket   05/26/2014 10:22 AM Meredith Staggers, MD Veatrice Bourbon, MD In Basket

## 2014-05-27 NOTE — Evaluation (Signed)
Occupational Therapy Assessment and Plan  Patient Details  Name: Kimberly Lane MRN: [id number] Date of Birth: 06-08-1960  OT Diagnosis: abnormal posture and muscle weakness (generalized) Rehab Potential: Rehab Potential (ACUTE ONLY): Good ELOS: 8-12 days   Today's Date: 05/27/2014 OT Individual Time: 8127-5170  OT Individual Time Calculation (min): 60 min   Problem List:  Patient Active Problem List   Diagnosis Date Noted  . Hx of right BKA 05/26/2014  . S/P BKA (below knee amputation) unilateral   . Abnormal EKG   . Preop cardiovascular exam 05/21/2014  . Cellulitis of toe of right foot   . Peripheral arterial disease   . Cellulitis 05/13/2014  . Type 2 diabetes mellitus with complication   . Cellulitis and abscess of toe   . HLD (hyperlipidemia)   . Pure hypercholesterolemia 11/14/2013  . High risk social situation 11/13/2013  . Back pain without radiation 09/05/2011    Class: Acute  . Osteoarthritis of left hip 05/31/2011  . History of primary TB 04/26/2011  . DM (diabetes mellitus), type 2, uncontrolled 05/17/2010  . Essential hypertension 12/09/2009    Past Medical History:  Past Medical History  Diagnosis Date  . Diabetes mellitus   . Hypertension   . Cellulitis and abscess of foot 05/13/2014    rt foot  . Positive TB test 2013   Past Surgical History:  Past Surgical History  Procedure Laterality Date  . Abdominal aortagram N/A 05/20/2014    Procedure: ABDOMINAL Maxcine Ham;  Surgeon: Serafina Mitchell, MD;  Location: [facility] CATH LAB;  Service: Cardiovascular;  Laterality: N/A;  . Amputation Right 05/23/2014    Procedure: AMPUTATION BELOW KNEE;  Surgeon: Mal Misty, MD;  Location: [facility] OR;  Service: Vascular;  Laterality: Right;    Assessment & Plan Clinical Impression: Patient is a 54 y.o. right handed limited English speaking refugee female with history of hypertension, diabetes mellitus with peripheral neuropathy. Presented 05/20/2014 with right fifth toe wound that  occurred approximately 2 weeks ago after she hit her toe against a chair at home. Patient lives with her husband had been using a wheelchair recently due to right foot wound. The pain has progressively worsened with cellulitic changes. X-rays show no evidence of osteomyelitis. Noted soft tissue swelling. MRI of the right foot again showed soft tissue laceration involving the fifth toe. Patient with progressive ischemic changes. 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 Limb was not felt to be salvageable and underwent right BKA 05/23/2014 per Dr. Kellie Simmering. Hospital course pain management. Acute blood loss anemia 8.7 and monitored. Follow-up prosthetic care with [facility] prost[facility]. Bouts of urinary retention with need for intermittent catheterizations volume 600 mL. Subcutaneous heparin for DVT prophylaxis. Physical occupational therapy evaluation completed 05/24/2014 with recommendations of physical medicine rehabilitation consult.    Patient transferred to CIR on 05/26/2014 .    Patient currently requires max with basic self-care skills secondary to muscle weakness.  Prior to hospitalization, patient could complete ADLs with independent .  Patient will benefit from skilled intervention to decrease level of assist with basic self-care skills and increase independence with basic self-care skills prior to discharge home with care partner.  Anticipate patient will require intermittent supervision and follow up home health.  OT - End of Session Activity Tolerance: Tolerates 30+ min activity with multiple rests Endurance Deficit: Yes Endurance Deficit Description: required multiple rest breaks and shoulder pain OT Assessment Rehab Potential (ACUTE ONLY): Good Barriers to Discharge: Inaccessible home environment Barriers to Discharge Comments: bathroom and bedroom  on second floor OT Patient demonstrates impairments in the following area(s): Balance;Endurance;Motor;Safety OT Basic  ADL's Functional Problem(s): Grooming;Bathing;Dressing;Toileting OT Transfers Functional Problem(s): Toilet;Tub/Shower OT Plan OT Intensity: Minimum of 1-2 x/day, 45 to 90 minutes OT Frequency: 5 out of 7 days OT Duration/Estimated Length of Stay: 8-12 days OT Treatment/Interventions: Balance/vestibular training;Discharge planning;Disease Lawyer;Functional mobility training;Pain management;Patient/family education;Psychosocial support;Self Care/advanced ADL retraining;Skin care/wound managment;Splinting/orthotics;Therapeutic Activities;Therapeutic Exercise;UE/LE Strength taining/ROM OT Self Feeding Anticipated Outcome(s): Mod I OT Basic Self-Care Anticipated Outcome(s): Supervision OT Toileting Anticipated Outcome(s): Supervision OT Bathroom Transfers Anticipated Outcome(s): Supervision OT Recommendation Patient destination: Home Follow Up Recommendations: Home health OT Equipment Recommended: 3 in 1 bedside comode;Tub/shower bench   Skilled Therapeutic Intervention OT eval completed with discussion of OT purpose, goals, and ELOS.  Pt's husband and interpreter present throughout session.  ADL assessment completed at sit > stand level with focus on safety with mobility, transfers, and standing balance/tolerance.  Squat pivot bed > w/c > toilet with mod assist.  Educated on squat pivot technique and appropriate hand placement with transfers and sit > stand.  Pt's husband wanting to assist pt with all mobility and transfers, educated on rehab process and encouraged to use call bell for all mobility.    OT Evaluation Precautions/Restrictions  Precautions Precautions: Fall Precaution Comments: Painful R shoulder. Restrictions Weight Bearing Restrictions: Yes RLE Weight Bearing: Non weight bearing General   Vital Signs Therapy Vitals BP: (!) 145/65 mmHg Pain Pain Assessment Pain Assessment: Faces Faces Pain Scale: Hurts little more Pain  Type: Surgical pain Pain Location: Leg Pain Orientation: Right Pain Descriptors / Indicators: Sore Pain Onset: On-going Pain Intervention(s): Repositioned (premedicated) Home Living/Prior Functioning Home Living Available Help at Discharge: Family, Available 24 hours/day Type of Home: Apartment Home Access: Level entry Home Layout: Two level, Bed/bath upstairs Alternate Level Stairs-Number of Steps: 10-15 steps  Lives With: Spouse IADL History Homemaking Responsibilities: Yes Meal Prep Responsibility: Secondary Cleaning Responsibility: No Prior Function Level of Independence: Independent with gait, Independent with transfers (independent prior to toe wound; used w/c with toe wound)  Able to Take Stairs?: Yes Driving: No Vocation: Unemployed Comments: in school, had a Engineer, agricultural" that would come to the house; refugee from [place] ADL  See FIM Vision/Perception  Vision- History Baseline Vision/History: No visual deficits Patient Visual Report: No change from baseline Vision- Assessment Vision Assessment?: No apparent visual deficits  Cognition Overall Cognitive Status: Within Functional Limits for tasks assessed Arousal/Alertness: Awake/alert Orientation Level:  (non English speaking, difficult to assess with interpretor and husband) Attention: Selective Awareness: Appears intact Safety/Judgment: Appears intact Sensation Sensation Light Touch: Impaired by gross assessment Stereognosis: Not tested Hot/Cold: Not tested Proprioception: Not tested Additional Comments: grossly decreased to light touch distal end of residual limb Coordination Gross Motor Movements are Fluid and Coordinated: Not tested Fine Motor Movements are Fluid and Coordinated: Not tested Motor  Motor Motor: Other (comment) Motor - Skilled Clinical Observations: generalized weakness in RLE; limited ROM Mobility  Bed Mobility Bed Mobility: Supine to Sit;Sit to Supine Supine to Sit: 5: Supervision;HOB  flat Sit to Supine: 5: Supervision;HOB flat Sit to Supine - Details (indicate cue type and reason): Supine <> sit on flat bed, no rails with extra time and verbal cues for positioning of R residual limb  Trunk/Postural Assessment  Cervical Assessment Cervical Assessment: Within Functional Limits Thoracic Assessment Thoracic Assessment: Within Functional Limits Lumbar Assessment Lumbar Assessment: Within Functional Limits Postural Control Postural Control: Within Functional Limits  Balance Balance Balance Assessed: Yes Static Sitting Balance  Static Sitting - Level of Assistance: 6: Modified independent (Device/Increase time) Dynamic Sitting Balance Dynamic Sitting - Level of Assistance: 6: Modified independent (Device/Increase time) Static Standing Balance Static Standing - Balance Support: Bilateral upper extremity supported Static Standing - Level of Assistance: 4: Min assist Dynamic Standing Balance Dynamic Standing - Balance Support: Bilateral upper extremity supported Dynamic Standing - Level of Assistance: 4: Min assist Extremity/Trunk Assessment RUE Assessment RUE Assessment: Exceptions to [facility] (pt reports pain in bicep/tricep area with difficulty raising arm overhead without assit from LUE, unable to hold against gravity) LUE Assessment LUE Assessment: Within Functional Limits  FIM:  FIM - Grooming Grooming Steps: Wash, rinse, dry face;Wash, rinse, dry hands;Oral care, brush teeth, clean dentures Grooming: 4: Patient completes 3 of 4 or 4 of 5 steps FIM - Bathing Bathing Steps Patient Completed: Chest;Right Arm;Left Arm;Abdomen;Front perineal area;Buttocks;Right upper leg;Left upper leg Bathing: 4: Min-Patient completes 8-9 [id number] parts or 75+ percent FIM - Upper Body Dressing/Undressing Upper body dressing/undressing steps patient completed: Thread/unthread right sleeve of pullover shirt/dresss;Thread/unthread left sleeve of pullover shirt/dress;Put head through opening  of pull over shirt/dress;Pull shirt over trunk Upper body dressing/undressing: 5: Set-up assist to: Obtain clothing/put away FIM - Lower Body Dressing/Undressing Lower body dressing/undressing steps patient completed: Thread/unthread right pants leg;Thread/unthread left pants leg Lower body dressing/undressing: 2: Max-Patient completed 25-49% of tasks FIM - Toileting Toileting steps completed by patient: Performs perineal hygiene Toileting Assistive Devices: Grab bar or rail for support Toileting: 3: Mod-Patient completed 2 of 3 steps FIM - BControl and instrumentation engineerDevices: Arm rests;Bed rails Bed/Chair Transfer: 5: Supine > Sit: Supervision (verbal cues/safety issues);5: Sit > Supine: Supervision (verbal cues/safety issues);3: Bed > Chair or W/C: Mod A (lift or lower assist);3: Chair or W/C > Bed: Mod A (lift or lower assist) FIM - TAir cabin crewTransfers Assistive Devices: Elevated toilet seat;Grab bars Toilet Transfers: 2-From toilet/BSC: Max A (lift and lower assist);2-To toilet/BSC: Max A (lift and lower assist) FIM - Tub/Shower Transfers Tub/shower Transfers: 0-Activity did not occur or was simulated   Refer to Care Plan for Long Term Goals  Recommendations for other services: None  Discharge Criteria: Patient will be discharged from OT if patient refuses treatment 3 consecutive times without medical reason, if treatment goals not met, if there is a change in medical status, if patient makes no progress towards goals or if patient is discharged from hospital.  The above assessment, treatment plan, treatment alternatives and goals were discussed and mutually agreed upon: by patient  HSimonne Come4/06/2014, 12:24 PM

## 2014-05-27 NOTE — Progress Notes (Signed)
Responded to on call chaplain volunteer who requested that follow up be done  with patients nurse and religious leader to support patient in securing her leg from morgue.  Patient leg was removed. Patient is  Muslim  and  family is asking for leg for burial as part of their religous practice.  I spoke with Ed the  patients' nurse who indicated that he had spoken with all necessary parties to insure patient request was honored.   Per patient nurse( Ed)  said that the interpreter was a member at the same mosque and had spoken with their [place] on how to proceed.   I indicated to patient nurse (Ed) if Chaplain services are needed further please page.

## 2014-05-27 NOTE — Progress Notes (Signed)
Results for NICHOLLE, COLDREN (MRN [id number]) as of 05/27/2014 13:26  Ref. Range 05/26/2014 05:59 05/26/2014 11:47 05/26/2014 21:49 05/27/2014 06:36 05/27/2014 11:54  Glucose-Capillary Latest Range: 70-99 mg/dL 199 (H) 273 (H) 297 (H) 270 (H) 192 (H)  4/5 HgbA1c-9.6 04/2014 Please consider increasing Lantus to 25 units daily.  Discussed the needs of the pt with the nurse.  Will try to assess the DM education needs tomorrow when the interpreter is available.  [place], CDE. M.Ed. Pager [phone] Inpatient Diabetes Coordinator

## 2014-05-27 NOTE — Evaluation (Signed)
Physical Therapy Assessment and Plan  Patient Details  Name: Kimberly Lane MRN: [id number] Date of Birth: 1960-08-14  PT Diagnosis: Difficulty walking, Impaired sensation, Muscle weakness and Pain in R residual limb Rehab Potential: Good ELOS: 7-9 days   Today's Date: 05/27/2014 PT Individual Time: 1000-1100 PT Individual Time Calculation (min): 60 min    Problem List:  Patient Active Problem List   Diagnosis Date Noted  . Hx of right BKA 05/26/2014  . S/P BKA (below knee amputation) unilateral   . Abnormal EKG   . Preop cardiovascular exam 05/21/2014  . Cellulitis of toe of right foot   . Peripheral arterial disease   . Cellulitis 05/13/2014  . Type 2 diabetes mellitus with complication   . Cellulitis and abscess of toe   . HLD (hyperlipidemia)   . Pure hypercholesterolemia 11/14/2013  . High risk social situation 11/13/2013  . Back pain without radiation 09/05/2011    Class: Acute  . Osteoarthritis of left hip 05/31/2011  . History of primary TB 04/26/2011  . DM (diabetes mellitus), type 2, uncontrolled 05/17/2010  . Essential hypertension 12/09/2009    Past Medical History:  Past Medical History  Diagnosis Date  . Diabetes mellitus   . Hypertension   . Cellulitis and abscess of foot 05/13/2014    rt foot  . Positive TB test 2013   Past Surgical History:  Past Surgical History  Procedure Laterality Date  . Abdominal aortagram N/A 05/20/2014    Procedure: ABDOMINAL Maxcine Ham;  Surgeon: Serafina Mitchell, MD;  Location: [facility] CATH LAB;  Service: Cardiovascular;  Laterality: N/A;  . Amputation Right 05/23/2014    Procedure: AMPUTATION BELOW KNEE;  Surgeon: Mal Misty, MD;  Location: [facility] OR;  Service: Vascular;  Laterality: Right;    Assessment & Plan Clinical Impression: Patient is a 54 y.o. right handed limited English speaking refugee female with history of hypertension, diabetes mellitus with peripheral neuropathy. Presented 05/20/2014 with right fifth toe wound that  occurred approximately 2 weeks ago after she hit her toe against a chair at home. Patient lives with her husband had been using a wheelchair recently due to right foot wound. The pain has progressively worsened with cellulitic changes. X-rays show no evidence of osteomyelitis. Noted soft tissue swelling. MRI of the right foot again showed soft tissue laceration involving the fifth toe. Patient with progressive ischemic changes. 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 Limb was not felt to be salvageable and underwent right BKA 05/23/2014 per Dr. Kellie Simmering. Hospital course pain management. Acute blood loss anemia 8.7 and monitored. Follow-up prosthetic care with [facility] prosthetics. Bouts of urinary retention with need for intermittent catheterizations volume 600 mL. Subcutaneous heparin for DVT prophylaxis.  Patient transferred to CIR on 05/26/2014 .   Patient currently requires mod with mobility secondary to muscle weakness and muscle joint tightness, decreased cardiorespiratoy endurance and decreased standing balance and decreased balance strategies.  Prior to hospitalization, patient was independent  with mobility and lived with Spouse in a [place] home.  Home access is  Level entry but has full flight of stairs to reach second level bed/bath.  Pt will likely require hospital bed and BSC set up on main level at D/C.    Patient will benefit from skilled PT intervention to maximize safe functional mobility, minimize fall risk and decrease caregiver burden for planned discharge home with 24 hour supervision.  Anticipate patient will benefit from follow up [place] at discharge.  PT - End of Session Activity Tolerance: Tolerates  30+ min activity with multiple rests Endurance Deficit: Yes Endurance Deficit Description: required multiple rest breaks and shoulder pain PT Assessment Rehab Potential (ACUTE/IP ONLY): Good Barriers to Discharge: Inaccessible home environment Barriers to  Discharge Comments: flight of stairs to bed/bath PT Patient demonstrates impairments in the following area(s): Balance;Endurance;Motor;Pain;Sensory PT Transfers Functional Problem(s): Bed to Chair;Bed Mobility;Car;Furniture PT Locomotion Functional Problem(s): Ambulation;Wheelchair Mobility PT Plan PT Intensity: Minimum of 1-2 x/day ,45 to 90 minutes PT Frequency: 5 out of 7 days PT Duration Estimated Length of Stay: 7-9 days PT Treatment/Interventions: Ambulation/gait training;Balance/vestibular training;Discharge planning;Disease management/prevention;DME/adaptive equipment instruction;Functional mobility training;Pain management;Patient/family education;Stair training;Therapeutic Activities;Therapeutic Exercise;UE/LE Strength taining/ROM;Wheelchair propulsion/positioning PT Transfers Anticipated Outcome(s): Mod I PT Locomotion Anticipated Outcome(s): Supervision; w/c mod I PT Recommendation Follow Up Recommendations: Home health PT;24 hour supervision/assistance Patient destination: Home Equipment Recommended: Wheelchair (measurements);Wheelchair cushion (measurements);Rolling walker with 5" wheels  Skilled Therapeutic Intervention Pt participated in PT evaluation.  Pt and husband educated on proper positioning while in bed and w/c to prevent hip and knee flexion contractures and educated on exercises (quad sets) in bed and w/c to counter act knee flexion contracture; also recommended pt lie in prone intermittently to stretch hip flexors.  Demonstrated to patient and husband method for hopping up steps; recommending that due to shoulder pain and # of stairs to reach second level that pt use hospital bed, BSC and sink baths on main level.  Pt and husband agreeable and husband reports he hopes they will be able to move into a single level apartment soon.  Pt and husband also educated on pt falls risk and importance of transferring only with staff currently until trained by therapy for transfers.   Pt and husband verbalized agreement.  Pt left at end of session with all items within reach.  PT Evaluation Precautions/Restrictions Precautions Precautions: Fall Precaution Comments: Painful R shoulder. Restrictions Weight Bearing Restrictions: Yes RLE Weight Bearing: Non weight bearing General Chart Reviewed: Yes Response to Previous Treatment: Patient with no complaints from previous session. Family/Caregiver Present: Yes  Vital SignsTherapy Vitals BP: (!) 145/65 mmHg Pain Pain Assessment Pain Assessment: Faces Faces Pain Scale: Hurts little more Pain Type: Surgical pain Pain Location: Leg Pain Orientation: Right Pain Descriptors / Indicators: Sore Pain Onset: On-going Pain Intervention(s): Repositioned (premedicated) Home Living/Prior Functioning Home Living Available Help at Discharge: Family;Available 24 hours/day Type of Home: Apartment Home Access: Level entry Home Layout: Two level;Bed/bath upstairs Alternate Level Stairs-Number of Steps: 10-15 steps  Lives With: Spouse Prior Function Level of Independence: Independent with gait;Independent with transfers (independent prior to toe wound; used w/c with toe wound)  Able to Take Stairs?: Yes Driving: No Vocation: Unemployed Comments: in school, had a Engineer, agricultural" that would come to the house; refugee from [place] Cognition Overall Cognitive Status: Within Functional Limits for tasks assessed Arousal/Alertness: Awake/alert Orientation Level:  (non English speaking, difficult to assess with interpretor and husband) Attention: Selective Awareness: Appears intact Safety/Judgment: Appears intact Sensation Sensation Light Touch: Impaired by gross assessment Stereognosis: Not tested Hot/Cold: Not tested Proprioception: Not tested Additional Comments: grossly decreased to light touch distal end of residual limb Coordination Gross Motor Movements are Fluid and Coordinated: Not tested Fine Motor Movements are Fluid and  Coordinated: Not tested Motor  Motor Motor: Other (comment) Motor - Skilled Clinical Observations: generalized weakness in RLE; limited ROM  Mobility Bed Mobility Bed Mobility: Supine to Sit;Sit to Supine Supine to Sit: 5: Supervision;HOB flat Sit to Supine: 5: Supervision;HOB flat Sit to Supine - Details (indicate cue type  and reason): Supine <> sit on flat bed, no rails with extra time and verbal cues for positioning of R residual limb Transfers Transfers: Yes Squat Pivot Transfers: 3: Mod assist Squat Pivot Transfer Details (indicate cue type and reason): Mod A for squat pivots bed > w/c > mat secondary to pt decreased ability to fully clear surface and unable to perform full pivot.  Requires verbal cues for sequencing and safe hand placement Locomotion  Ambulation Ambulation/Gait Assistance: 3: Mod assist Ambulation/Gait Assistance Details: Performed gait x 10' on level surfaces with RW and mod A with verbal and visual cues to maintain safe distance to RW.  Also required verbal cues for safe hand placement and sequence for sit > stand from mat.  Pt fatigued quickly Stairs / Additional Locomotion Stairs: No Architect: Yes Wheelchair Assistance: 4: Min Lexicographer: Both upper extremities Wheelchair Parts Management: Needs assistance Distance: 150; required intermittent hand over hand assistance for steering/changing direction; educated on parts management for set up prior to transfer.    Trunk/Postural Assessment  Cervical Assessment Cervical Assessment: Within Functional Limits Thoracic Assessment Thoracic Assessment: Within Functional Limits Lumbar Assessment Lumbar Assessment: Within Functional Limits Postural Control Postural Control: Within Functional Limits  Balance Balance Balance Assessed: Yes Static Sitting Balance Static Sitting - Level of Assistance: 6: Modified independent (Device/Increase time) Dynamic Sitting  Balance Dynamic Sitting - Level of Assistance: 6: Modified independent (Device/Increase time) Static Standing Balance Static Standing - Balance Support: Bilateral upper extremity supported Static Standing - Level of Assistance: 4: Min assist Dynamic Standing Balance Dynamic Standing - Balance Support: Bilateral upper extremity supported Dynamic Standing - Level of Assistance: 4: Min assist Extremity Assessment  RUE Assessment RUE Assessment: Exceptions to [facility] (pt reports pain in bicep/tricep area with difficulty raising arm overhead without assit from LUE, unable to hold against gravity) LUE Assessment LUE Assessment: Within Functional Limits RLE Assessment RLE Assessment: Exceptions to [facility] RLE AROM (degrees) Right Knee Extension: -25 Right Knee Flexion: 70 RLE Strength RLE Overall Strength: Deficits;Due to pain RLE Overall Strength Comments: 3/5 hip flexion; 2+/5 within available range knee flexion/extension LLE Assessment LLE Assessment: Within Functional Limits  FIM:  FIM - Bed/Chair Transfer Bed/Chair Transfer Assistive Devices: Arm rests;Bed rails Bed/Chair Transfer: 5: Supine > Sit: Supervision (verbal cues/safety issues);5: Sit > Supine: Supervision (verbal cues/safety issues);3: Bed > Chair or W/C: Mod A (lift or lower assist);3: Chair or W/C > Bed: Mod A (lift or lower assist) FIM - Locomotion: Wheelchair Distance: 150; required intermittent hand over hand assistance for steering/changing direction; educated on parts management for set up prior to transfer.   Locomotion: Wheelchair: 4: Travels 150 ft or more: maneuvers on rugs and over door sillls with minimal assistance (Pt.>75%) FIM - Locomotion: Ambulation Locomotion: Ambulation Assistive Devices: Administrator Ambulation/Gait Assistance: 3: Mod assist Locomotion: Ambulation: 1: Travels less than 50 ft with moderate assistance (Pt: 50 - 74%) FIM - Locomotion: Stairs Locomotion: Stairs: 0: Activity did not occur  (unsafe secondary to amputation)   Refer to Care Plan for Long Term Goals  Recommendations for other services: None  Discharge Criteria: Patient will be discharged from PT if patient refuses treatment 3 consecutive times without medical reason, if treatment goals not met, if there is a change in medical status, if patient makes no progress towards goals or if patient is discharged from hospital.  The above assessment, treatment plan, treatment alternatives and goals were discussed and mutually agreed upon: by patient and by family  Raylene Everts [place] 05/27/2014, 11:33 AM

## 2014-05-27 NOTE — Plan of Care (Signed)
Problem: RH Tub/Shower Transfers Goal: LTG Patient will perform tub/shower transfers w/assist (OT) LTG: Patient will perform tub/shower transfers with assist, with/without cues using equipment (OT)  Outcome: Not Applicable Date Met:  53/97/67 D/C due to pt having shower upstairs and will have to perform sponge bath on main floor.

## 2014-05-28 ENCOUNTER — Inpatient Hospital Stay (HOSPITAL_COMMUNITY): Payer: Self-pay | Admitting: Physical Therapy

## 2014-05-28 ENCOUNTER — Inpatient Hospital Stay (HOSPITAL_COMMUNITY): Payer: No Typology Code available for payment source | Admitting: Occupational Therapy

## 2014-05-28 ENCOUNTER — Inpatient Hospital Stay (HOSPITAL_COMMUNITY): Payer: Self-pay | Admitting: Occupational Therapy

## 2014-05-28 ENCOUNTER — Inpatient Hospital Stay (HOSPITAL_COMMUNITY): Payer: Self-pay

## 2014-05-28 ENCOUNTER — Inpatient Hospital Stay (HOSPITAL_COMMUNITY): Payer: No Typology Code available for payment source | Admitting: Physical Therapy

## 2014-05-28 DIAGNOSIS — R05 Cough: Secondary | ICD-10-CM | POA: Diagnosis not present

## 2014-05-28 DIAGNOSIS — J9811 Atelectasis: Secondary | ICD-10-CM

## 2014-05-28 DIAGNOSIS — Z89511 Acquired absence of right leg below knee: Secondary | ICD-10-CM | POA: Diagnosis not present

## 2014-05-28 LAB — GLUCOSE, CAPILLARY
GLUCOSE-CAPILLARY: 218 mg/dL — AB (ref 70–99)
Glucose-Capillary: 262 mg/dL — ABNORMAL HIGH (ref 70–99)
Glucose-Capillary: 173 mg/dL — ABNORMAL HIGH (ref 70–99)
Glucose-Capillary: 150 mg/dL — ABNORMAL HIGH (ref 70–99)

## 2014-05-28 MED ORDER — OXYCODONE HCL ER 10 MG PO T12A
10.0000 mg | EXTENDED_RELEASE_TABLET | ORAL | Status: AC
  Administered 2014-05-28: 10 mg via ORAL
  Filled 2014-05-28: qty 1

## 2014-05-28 MED ORDER — OXYCODONE HCL ER 10 MG PO T12A
10.0000 mg | EXTENDED_RELEASE_TABLET | Freq: Two times a day (BID) | ORAL | Status: DC
  Administered 2014-05-28 – 2014-06-03 (×12): 10 mg via ORAL
  Filled 2014-05-28 (×12): qty 1

## 2014-05-28 NOTE — Progress Notes (Signed)
Occupational Therapy Session Note  Patient Details  Name: Kimberly Lane MRN: [id number] Date of Birth: 11-Aug-1960  Today's Date: 05/28/2014 OT Individual Time: 1100-1200 OT Individual Time Calculation (min): 60 min    Short Term Goals: Week 1:  OT Short Term Goal 1 (Week 1): STG = LTGs due to ELOS  Skilled Therapeutic Interventions/Progress Updates:    Pt seen for OT therapy focusing on functional transfers. Pt in supine upon arrival agreeable to therapy with interpreter present. Pt transferred supine> EOB with supervision and mod A EOB> w/c via squat pivot. Pt taken to ADL apartment to practice functional transfers. Pt transferred onto and off toilet with min-mod steadying assist with 10 hops using RW to the toilet and min A for steadying down to toilet. She completed toileting task including hygiene and pulling pants up/down with mod steadying assist. Pt then completed transfer onto standard bed with steadying assist hopping on RW. Pt completed bed mobility with supervision. She then completed transfer onto low surface couch, requiring mod A to stand. Pt with increased performance when using R UE to push off from service with L UE on walker. Pt completed simulated shower transfer for step in shower, and completed with steadying assist and cues. Pt required verbal and tactile cues throughout session for management of RW when turning and for hand placement on RW during transfers. At end of session, pt instructed on using L UE to propel w/c back to room. Pt required max cues to sequence task, and was unable to do so at a functional level at this time. Pt returned to bed at end of session with mod A w/c> EOB and supervision EOB> supine. Pt left in supine all needs in reach with interpreter present.   Pt education provided regarding functional transfers, hand placement during transfers, DME, and d/c planning. Pt reports that husband is working with apartment company to get 1 level apartment at d/c. OT will  cont to follow up and change goals as appropriate.   Therapy Documentation Precautions:  Precautions Precautions: Fall Precaution Comments: Painful R shoulder. Restrictions Weight Bearing Restrictions: Yes RLE Weight Bearing: Non weight bearing Pain: Pain Assessment Pain Assessment: 0-10 Pain Score: 7  Pain Type: Acute pain Pain Location: Shoulder Pain Orientation: Right Pain Descriptors / Indicators: Aching Pain Onset: With Activity Pain Intervention(s): Repositioned  See FIM for current functional status  Therapy/Group: Individual Therapy  Lewis, Doreene Forrey C 05/28/2014, 12:14 PM

## 2014-05-28 NOTE — Progress Notes (Signed)
Occupational Therapy Session Note  Patient Details  Name: Japji Kluk MRN: [id number] Date of Birth: 1960-03-14  Today's Date: 05/28/2014 OT Individual Time: 1430-1500 OT Individual Time Calculation (min): 30 min    Short Term Goals: Week 1:  OT Short Term Goal 1 (Week 1): STG = LTGs due to ELOS  Skilled Therapeutic Interventions/Progress Updates:  Upon entering the room, pt supine in bed with no interpreter present. Husband present who also understands limited english as well. Ace wrap coming undone from R stump and therapist rewrapped stump and pt showing signs of pain during tasks. Therapist briefly discussed and demonstrated rubbing and tapping for pain relief and pt demonstrated understanding. Pt performed lateral scoots to end of bed and up towards [facility] for B UE strengthening and mobility. Pt engaged in 2 sets of 15 bicep curls with level 2 resistive theraband with min verbal cues for proper technique. Pt returning from sit >supine with close supervision. Bed alarm activated, call bell within reach, and husband remains present in room upon exiting.   Therapy Documentation Precautions:  Precautions Precautions: Fall Precaution Comments: Painful R shoulder. Restrictions Weight Bearing Restrictions: Yes RLE Weight Bearing: Non weight bearing Vital Signs: Therapy Vitals Temp: 98.4 F (36.9 C) Temp Source: Oral Pulse Rate: 86 Resp: 18 BP: 135/64 mmHg Patient Position (if appropriate): Lying Oxygen Therapy SpO2: 98 % O2 Device: Not Delivered Pain: Pain Assessment Pain Score: 3   See FIM for current functional status  Therapy/Group: Individual Therapy  Phineas Semen 05/28/2014, 5:26 PM

## 2014-05-28 NOTE — Progress Notes (Signed)
Occupational Therapy Session Note  Patient Details  Name: Janetzy Barut MRN: [id number] Date of Birth: 1960/10/22  Today's Date: 05/28/2014 OT Individual Time: [id number] Individual Time Calculation (min): 60 min    Short Term Goals: Week 1:  OT Short Term Goal 1 (Week 1): STG = LTGs due to ELOS  Skilled Therapeutic Interventions/Progress Updates:    Engaged in ADL retraining with focus on transfers and education with pt and husband regarding goals and purpose of OT.  Pt already dressed upon arrival, via interpretor pt's husband assisted pt with dressing prior to session.  Educated on OT goals and participation with OT during self-care tasks.  Educated on residual limb care, with RN present to re-wrap limb.  Squat pivot transfer bed > w/c > toilet with mod assist with cues for hand placement to increase safety.  In therapy gym engaged in blocked practice of w/c <> therapy mat transfers with focus on positioning and w/c parts management to increase safety and participation.  Progressed from mod to min with carryover of hand placement and weight shifting.  Therapy Documentation Precautions:  Precautions Precautions: Fall Precaution Comments: Painful R shoulder. Restrictions Weight Bearing Restrictions: Yes RLE Weight Bearing: Non weight bearing General:   Vital Signs: Therapy Vitals BP: 134/72 mmHg Pain:  Pt with c/o pain in residual limb, RN provided pain meds and rewrapped limb at beginning of session  See FIM for current functional status  Therapy/Group: Individual Therapy  Simonne Come 05/28/2014, 10:23 AM

## 2014-05-28 NOTE — Progress Notes (Signed)
54 y.o. right handed limited English speaking refugee female with history of hypertension, diabetes mellitus with peripheral neuropathy. Presented 05/20/2014 with right fifth toe wound that occurred approximately 2 weeks ago after she hit her toe against a chair at home. Patient lives with her husband had been using a wheelchair recently due to right foot wound. The pain has progressively worsened with cellulitic changes. X-rays show no evidence of osteomyelitis. Noted soft tissue swelling. MRI of the right foot again showed soft tissue laceration involving the fifth toe. Patient with progressive ischemic changes. 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 Limb was not felt to be salvageable and underwent right BKA 05/23/2014 per Dr. Kellie Simmering. Hospital course pain management. Acute blood loss anemia 8.7 and monitored. Follow-up prosthetic care with [employer]  Subjective/Complaints: Interpreter at bedside as is RN, OT and Husband, feels comp wrap too tight No cough this am ROS: no phantom pain, only stump pain  Objective: Vital Signs: Blood pressure 134/72, pulse 71, temperature 99 F (37.2 C), temperature source Oral, resp. rate 17, weight 59 kg (130 lb 1.1 oz), SpO2 100 %. Dg Chest 2 View  05/27/2014   CLINICAL DATA:  Persistent cough.  Initial encounter.  EXAM: CHEST  2 VIEW  COMPARISON:  None.  FINDINGS: Cardiopericardial silhouette mildly enlarged. No airspace disease. No effusion. Lung volumes are lower than on prior, a which produces pulmonary vascular crowding. Mediastinal contours appear within normal limits allowing for lung volumes.  IMPRESSION: Low volume chest.  No acute cardiopulmonary disease.   Electronically Signed   By: Dereck Ligas M.D.   On: 05/27/2014 18:11   Results for orders placed or performed during the hospital encounter of 05/26/14 (from the past 72 hour(s))  Glucose, capillary     Status: Abnormal   Collection Time: 05/26/14  9:49 PM   Result Value Ref Range   Glucose-Capillary 297 (H) 70 - 99 mg/dL  Glucose, capillary     Status: Abnormal   Collection Time: 05/27/14  6:36 AM  Result Value Ref Range   Glucose-Capillary 270 (H) 70 - 99 mg/dL  CBC WITH DIFFERENTIAL     Status: Abnormal   Collection Time: 05/27/14  8:55 AM  Result Value Ref Range   WBC 10.7 (H) 4.0 - 10.5 K/uL   RBC 3.59 (L) 3.87 - 5.11 MIL/uL   Hemoglobin 9.5 (L) 12.0 - 15.0 g/dL   HCT 28.5 (L) 36.0 - 46.0 %   MCV 79.4 78.0 - 100.0 fL   MCH 26.5 26.0 - 34.0 pg   MCHC 33.3 30.0 - 36.0 g/dL   RDW 13.2 11.5 - 15.5 %   Platelets 436 (H) 150 - 400 K/uL   Neutrophils Relative % 66 43 - 77 %   Neutro Abs 7.0 1.7 - 7.7 K/uL   Lymphocytes Relative 26 12 - 46 %   Lymphs Abs 2.8 0.7 - 4.0 K/uL   Monocytes Relative 7 3 - 12 %   Monocytes Absolute 0.7 0.1 - 1.0 K/uL   Eosinophils Relative 1 0 - 5 %   Eosinophils Absolute 0.1 0.0 - 0.7 K/uL   Basophils Relative 1 0 - 1 %   Basophils Absolute 0.1 0.0 - 0.1 K/uL  Comprehensive metabolic panel     Status: Abnormal   Collection Time: 05/27/14  8:55 AM  Result Value Ref Range   Sodium 139 135 - 145 mmol/L   Potassium 3.7 3.5 - 5.1 mmol/L   Chloride 100 96 - 112 mmol/L   CO2 32  19 - 32 mmol/L   Glucose, Bld 300 (H) 70 - 99 mg/dL   BUN <5 (L) 6 - 23 mg/dL   Creatinine, Ser 0.50 0.50 - 1.10 mg/dL   Calcium 8.9 8.4 - 10.5 mg/dL   Total Protein 7.4 6.0 - 8.3 g/dL   Albumin 2.5 (L) 3.5 - 5.2 g/dL   AST 21 0 - 37 U/L   ALT 18 0 - 35 U/L   Alkaline Phosphatase 72 39 - 117 U/L   Total Bilirubin 0.4 0.3 - 1.2 mg/dL   GFR calc non Af Amer >90 >90 mL/min   GFR calc Af Amer >90 >90 mL/min    Comment: (NOTE) The eGFR has been calculated using the CKD EPI equation. This calculation has not been validated in all clinical situations. eGFR's persistently <90 mL/min signify possible Chronic Kidney Disease.    Anion gap 7 5 - 15  Glucose, capillary     Status: Abnormal   Collection Time: 05/27/14 11:54 AM  Result  Value Ref Range   Glucose-Capillary 192 (H) 70 - 99 mg/dL  Glucose, capillary     Status: Abnormal   Collection Time: 05/27/14  4:17 PM  Result Value Ref Range   Glucose-Capillary 165 (H) 70 - 99 mg/dL  Glucose, capillary     Status: Abnormal   Collection Time: 05/27/14  8:43 PM  Result Value Ref Range   Glucose-Capillary 196 (H) 70 - 99 mg/dL  Urinalysis, Routine w reflex microscopic     Status: Abnormal   Collection Time: 05/27/14 11:00 PM  Result Value Ref Range   Color, Urine YELLOW YELLOW   APPearance CLEAR CLEAR   Specific Gravity, Urine 1.015 1.005 - 1.030   pH 7.0 5.0 - 8.0   Glucose, UA 100 (A) NEGATIVE mg/dL   Hgb urine dipstick NEGATIVE NEGATIVE   Bilirubin Urine NEGATIVE NEGATIVE   Ketones, ur NEGATIVE NEGATIVE mg/dL   Protein, ur NEGATIVE NEGATIVE mg/dL   Urobilinogen, UA 1.0 0.0 - 1.0 mg/dL   Nitrite NEGATIVE NEGATIVE   Leukocytes, UA NEGATIVE NEGATIVE    Comment: MICROSCOPIC NOT DONE ON URINES WITH NEGATIVE PROTEIN, BLOOD, LEUKOCYTES, NITRITE, OR GLUCOSE <1000 mg/dL.  Glucose, capillary     Status: Abnormal   Collection Time: 05/28/14  6:32 AM  Result Value Ref Range   Glucose-Capillary 218 (H) 70 - 99 mg/dL     HEENT: normal Cardio: RRR Resp: CTA B/L GI: BS positive Extremity:  Edema right BK stump Skin:   Wound C/D/I and Right BKA Neuro: Lethargic and Abnormal Motor 4/5 in BUE and LLE, R HF Musc/Skel:  Other short Right BKA, no excessive stump edema,  Gen NAD   Assessment/Plan: 1. Functional deficits secondary to Right BKA which require 3+ hours per day of interdisciplinary therapy in a comprehensive inpatient rehab setting. Physiatrist is providing close team supervision and 24 hour management of active medical problems listed below. Physiatrist and rehab team continue to assess barriers to discharge/monitor patient progress toward functional and medical goals. Team conference today please see physician documentation under team conference tab, met with  team face-to-face to discuss problems,progress, and goals. Formulized individual treatment plan based on medical history, underlying problem and comorbidities. FIM: FIM - Bathing Bathing Steps Patient Completed: Chest, Right Arm, Left Arm, Abdomen, Front perineal area, Buttocks, Right upper leg, Left upper leg Bathing: 4: Min-Patient completes 8-9 86f10 parts or 75+ percent  FIM - Upper Body Dressing/Undressing Upper body dressing/undressing steps patient completed: Thread/unthread right sleeve of pullover shirt/dresss,  Thread/unthread left sleeve of pullover shirt/dress, Put head through opening of pull over shirt/dress, Pull shirt over trunk Upper body dressing/undressing: 5: Set-up assist to: Obtain clothing/put away FIM - Lower Body Dressing/Undressing Lower body dressing/undressing steps patient completed: Thread/unthread right pants leg, Thread/unthread left pants leg Lower body dressing/undressing: 2: Max-Patient completed 25-49% of tasks  FIM - Toileting Toileting steps completed by patient: Performs perineal hygiene Toileting Assistive Devices: Grab bar or rail for support Toileting: 3: Mod-Patient completed 2 of 3 steps  FIM - Radio producer Devices: Elevated toilet seat, Grab bars Toilet Transfers: 2-From toilet/BSC: Max A (lift and lower assist), 2-To toilet/BSC: Max A (lift and lower assist)  FIM - Control and instrumentation engineer Devices: Arm rests, Bed rails Bed/Chair Transfer: 5: Supine > Sit: Supervision (verbal cues/safety issues), 5: Sit > Supine: Supervision (verbal cues/safety issues), 1: Bed > Chair or W/C: Total A (helper does all/Pt. < 25%), 2: Chair or W/C > Bed: Max A (lift and lower assist)  FIM - Locomotion: Wheelchair Distance: 150; required intermittent hand over hand assistance for steering/changing direction; educated on parts management for set up prior to transfer.   Locomotion: Wheelchair: 4: Travels 150 ft or  more: maneuvers on rugs and over door sillls with minimal assistance (Pt.>75%) FIM - Locomotion: Ambulation Locomotion: Ambulation Assistive Devices: Administrator Ambulation/Gait Assistance: 3: Mod assist Locomotion: Ambulation: 1: Travels less than 50 ft with moderate assistance (Pt: 50 - 74%)  Comprehension Comprehension Mode: Not assessed (nonenglish speaking)  Expression Expression Mode: Not assessed (nonenglish speaking)  Social Interaction Social Interaction Mode: Not assessed (nonenglish speaking)  Problem Solving Problem Solving Mode: Not assessed (nonenglish speaking)  Memory Memory Mode: Not assessed (nonenglish speaking)  Medical Problem List and Plan: 1. Functional deficits secondary to right BKA secondary to PVD 2. DVT Prophylaxis/Anticoagulation: Subcutaneous heparin. Monitor platelet counts for any signs of bleeding 3. Pain Management: Oxycodone as needed. Monitor with increased mobility, add low dose oxycontin 51m BID4. Acute blood loss anemia. Follow-up CBC 5. Neuropsych: This patient is capable of making decisions on her own behalf. 6. Skin/Wound Care: Routine skin checks 7. Fluids/Electrolytes/Nutrition: Strict I and O follow-up chemistries 8. Diabetes mellitus and peripheral neuropathy. Hemoglobin A1c 9.6. Lantus insulin 20 units daily. Check blood sugars before meals and at bedtime 9. Hypertension. Lisinopril 20 mg daily, Norvasc 10 mg daily. Monitor with increased mobility 10. Hyperlipidemia. Lipitor2 11. Constipation. Adjust bowel program 12.  Leukocytosis, low grade temp, - CXR and UA, likely atelectasis, enc IS LOS (Days) 2 A FACE TO FACE EVALUATION WAS PERFORMED  KIRSTEINS,ANDREW E 05/28/2014, 8:45 AM

## 2014-05-28 NOTE — Progress Notes (Signed)
Inpatient Diabetes Program Recommendations  AACE/ADA: New Consensus Statement on Inpatient Glycemic Control (2013)  Target Ranges:  Prepandial:   less than 140 mg/dL      Peak postprandial:   less than 180 mg/dL (1-2 hours)      Critically ill patients:  140 - 180 mg/dL   Inpatient Diabetes Program Recommendations Insulin - Basal: Appears to need an increase in lantus as fasting glucose remains in 200's.  Please also add HS correction in order to better control glucose througout the night as well.  Thank you Rosita Kea, RN, MSN, CDE  Diabetes Inpatient Program Office: [phone] Pager: [phone] 8:00 am to 5:00 pm

## 2014-05-28 NOTE — Progress Notes (Signed)
Physical Therapy Session Note  Patient Details  Name: Kimberly Lane MRN: [id number] Date of Birth: 1960-07-24  Today's Date: 05/28/2014 PT Individual Time: 0930-1030 PT Individual Time Calculation (min): 60 min   Short Term Goals: Week 1:  PT Short Term Goal 1 (Week 1): = LTG secondary to short LOS  Skilled Therapeutic Interventions/Progress Updates:    Pt received seated in w/c accompanied by husband and translator, reporting fatigue from previous OT session but agreeable to therapy. Transported pt to gym in w/c with totla A for time management. Pt performed sit > stand with mod A, stand > sit with min A from w/c with rolling walker. Pt performed gait x16' with rolling walker and mod A, increased time using "hop-to" pattern; cueing focused on increased LLE clearance by increasing utilization of UE's. To reinforce said cueing, transported pt to parallel bars, where pt performed lifting LLE off floor via bilat UE's. Pt able to tolerate 3 reps prior to requesting to sit secondary to pain in R shoulder; RN made aware. Performed squat pivot transfer from mat table <> w/c > bed with mod A, max cueing for setup and w/c parts management with inconsistent within-session carryover. In supine, pt performed isometric quadriceps activation x12 reps with tactile cueing for technique. Concurrently educated pt/husband on performance of quadriceps activation for pt R knee flexion contracture. During rest breaks, pt positioned in prone to increase extensibility of R hip flexors. Session ended in pt room, where pt was left upright in bed (per pt request) with breakfast tray set up, husband/interpreter present, and bed alarm on.  Therapy Documentation Precautions:  Precautions Precautions: Fall Precaution Comments: Painful R shoulder. Restrictions Weight Bearing Restrictions: Yes RLE Weight Bearing: Non weight bearing Vital Signs: Therapy Vitals BP: (!) 146/70 mmHg Pain: Pain Assessment Pain Assessment:  0-10 Pain Score: 7  Pain Type: Acute pain Pain Location: Shoulder Pain Orientation: Right Pain Descriptors / Indicators: Aching Pain Onset: With Activity Pain Intervention(s): RN made aware Locomotion : Ambulation Ambulation/Gait Assistance: 3: Mod assist   See FIM for current functional status  Therapy/Group: Individual Therapy  Derrik Mceachern, Malva Cogan 05/28/2014, 10:50 AM

## 2014-05-29 ENCOUNTER — Inpatient Hospital Stay (HOSPITAL_COMMUNITY): Payer: No Typology Code available for payment source | Admitting: Physical Therapy

## 2014-05-29 ENCOUNTER — Inpatient Hospital Stay (HOSPITAL_COMMUNITY): Payer: No Typology Code available for payment source | Admitting: Occupational Therapy

## 2014-05-29 DIAGNOSIS — M12811 Other specific arthropathies, not elsewhere classified, right shoulder: Secondary | ICD-10-CM | POA: Diagnosis not present

## 2014-05-29 DIAGNOSIS — Z89511 Acquired absence of right leg below knee: Secondary | ICD-10-CM | POA: Diagnosis not present

## 2014-05-29 DIAGNOSIS — R05 Cough: Secondary | ICD-10-CM | POA: Diagnosis not present

## 2014-05-29 LAB — GLUCOSE, CAPILLARY
GLUCOSE-CAPILLARY: 193 mg/dL — AB (ref 70–99)
Glucose-Capillary: 226 mg/dL — ABNORMAL HIGH (ref 70–99)
Glucose-Capillary: 130 mg/dL — ABNORMAL HIGH (ref 70–99)
Glucose-Capillary: 184 mg/dL — ABNORMAL HIGH (ref 70–99)

## 2014-05-29 NOTE — Progress Notes (Signed)
[facility] Individual Statement of Services  Patient Name:  Kimberly Lane  Date:  05/29/2014  Welcome to the [facility].  Our goal is to provide you with an individualized program based on your diagnosis and situation, designed to meet your specific needs.  With this comprehensive rehabilitation program, you will be expected to participate in at least 3 hours of rehabilitation therapies Monday-Friday, with modified therapy programming on the weekends.  Your rehabilitation program will include the following services:  Physical Therapy (PT), Occupational Therapy (OT), 24 hour per day rehabilitation nursing, Case Management (Social Worker), Rehabilitation Medicine, Nutrition Services and Pharmacy Services  Weekly team conferences will be held on Wednesdays to discuss your progress.  Your Social Worker will talk with you frequently to get your input and to update you on team discussions.  Team conferences with you and your family in attendance may also be held.  Expected length of stay: 7 to 12 days  Overall anticipated outcome:  Supervision with some modified independence  Depending on your progress and recovery, your program may change. Your Social Worker will coordinate services and will keep you informed of any changes. Your Social Worker's name and contact numbers are listed  below.  The following services may also be recommended but are not provided by the [facility] will be made to provide these services after discharge if needed.  Arrangements include referral to agencies that provide these services.  Your insurance has been verified to be:  Medicaid has been applied for Your primary doctor is:  [facility]  Pertinent information will be shared with your doctor and your insurance  company.  Social Worker:  Alfonse Alpers, LCSW  [phone] or [phone]  Information discussed with and copy given to patient by: Trey Sailors, 05/29/2014, 12:32 PM

## 2014-05-29 NOTE — Progress Notes (Signed)
Occupational Therapy Session Note  Patient Details  Name: Kimberly Lane MRN: [id number] Date of Birth: 25-Apr-1960  Today's Date: 05/29/2014 OT Individual Time: [id number] and [id number] OT Individual Time Calculation (min): 60 min and 30 min   Short Term Goals: Week 1:  OT Short Term Goal 1 (Week 1): STG = LTGs due to ELOS  Skilled Therapeutic Interventions/Progress Updates:    1) Engaged in ADL retraining with focus on transfers and increased independence with self-care tasks.  Engaged in bathing and dressing at sit > stand level from toilet.  Mod assist squat pivot w/c <> toilet with use of grab bar and min/steady assist while standing to complete clothing management.  Educated pt on [facility] over toilet and drop arm BSC as options for home, as plan is to move to single level home prior to d/c.  Completed squat pivot transfers to drop arm BSC and ambulated with RW to [facility] over toilet, pt reports willing to utilize whichever therapist recommends.    2) Engaged in therapeutic exercise with focus on BUE strengthening to increase participation in transfers.  Utilized level 2 theraband seated in w/c with min verbal and demonstration cues for proper technique.  Pt gestured to bed and completed squat pivot transfer to bed with mod assist, left seated upright in bed with call bell in reach.  Therapy Documentation Precautions:  Precautions Precautions: Fall Precaution Comments: Painful R shoulder. Restrictions Weight Bearing Restrictions: Yes RLE Weight Bearing: Non weight bearing Pain:  Pt with c/o pain in Rt shoulder, RN notified, premedicated  See FIM for current functional status  Therapy/Group: Individual Therapy  Simonne Come 05/29/2014, 11:04 AM

## 2014-05-29 NOTE — Progress Notes (Addendum)
Physical Therapy Session Note  Patient Details  Name: Kimberly Lane MRN: [id number] Date of Birth: 02/11/1961  Today's Date: 05/29/2014 PT Individual Time: 0930-1030 PT Individual Time Calculation (min): 60 min   Short Term Goals: Week 1:  PT Short Term Goal 1 (Week 1): = LTG secondary to short LOS  Skilled Therapeutic Interventions/Progress Updates:    Pt received seated in w/c accompanied by interpreter. Session focused on increasing pt independence with w/c parts management, transfer setup, and level/unlevel functional transfers in home environment. Pt performed w/c mobility 150 ' x2 in controlled and home environments with bilat UE's and min A, HOH assist for technique with turning, verbal cueing for increased efficiency of w/c propulsion technique. In rehab apartment (quiet, minimal distractions), pt performed multiple squat pivot transfers from bed <> w/c <> couch with mod A for transfers to elevated surface, min A for transfers to lower surface. Pt initially required > 75% multimodal cueing for transfer setup, w/c parts management and required 50% cueing during final transfer. Pt required supervision for supine <> sit on apartment bed. Correction: Final squat pivot transfers performed in (busy) treatment gym, from w/c <> level mat table with min A and 50% cueing for setup and w/c parts management. Session ended in pt room, where pt was left seated in w/c with interpreter present and all needs within reach.  Pt consistently requesting to return to bed following PT sessions. Upon further questioning by this PT, pt replied (per interpreter),"I don't want to sit in the chair because I am afraid I will fall." Pt agreeable to sitting in w/c if quick release belt in place. RN made aware and safety plan updated accordingly.  Therapy Documentation Precautions:  Precautions Precautions: Fall Precaution Comments: Painful R shoulder. Restrictions Weight Bearing Restrictions: Yes RLE Weight Bearing: Non  weight bearing Pain: Pain Assessment Pain Assessment: No/denies pain Locomotion : Wheelchair Mobility Distance: 150  See FIM for current functional status  Therapy/Group: Individual Therapy  Travares Nelles, Malva Cogan 05/29/2014, 11:53 AM

## 2014-05-29 NOTE — Progress Notes (Signed)
Occupational Therapy Session Note  Patient Details  Name: Kimberly Lane MRN: [id number] Date of Birth: 05/30/1960  Today's Date: 05/29/2014 OT Individual Time: 1100-1200 OT Individual Time Calculation (min): 60 min    Short Term Goals: Week 1:  OT Short Term Goal 1 (Week 1): STG = LTGs due to ELOS  Skilled Therapeutic Interventions/Progress Updates:    1:1 focused on basic transfer training and therapeutic exercise.   Pt in w/c when arrived. Focus on functional ambulation with RW from bathroom door into bathroom to toilet. Min VC for hand placement for sit to stand; rather than pulling up on RW. Pt able to perform 3/3 toileting tasks sit to stand after BM with minA. Pt was able to stand with minA without any UE support on RW during clothing management. Pt returned to w/c at [facility] door entry. Pt stood at sink to wash hands but due to fatigue was not able to maintain an upright posture during task. Pt able to propel self to gym increasing functional activity tolerance and endurance. Transferred on the the mat with min guard with focus on pt setting up her w/c in prep for transfer with mod cuing. On the mat focus on promotion of hip extension laying supine on back and then on front with exercises of hip extension. Education on providing care to residual limb - including reemphasizing desentization techniques and applying lotion to skin (not on incision)  To keep skin Moisturized. Also practice w/c<> couch transfers in family room including w.c setup. Pt required more than reasonable amt of time to setup w/c and mod cuing for proper setup. Min A w/c<couch and min A to return to chair with cuing for proper hand and body placement. Encouraged pt to sit up for lunch and then lay down in wish to. Left with safety belt and call bell at bedside.   Therapy Documentation Precautions:  Precautions Precautions: Fall Precaution Comments: Painful R shoulder. Restrictions Weight Bearing Restrictions: Yes RLE Weight  Bearing: Non weight bearing Pain:  c/o mild pain in residual limb- repositioned and rest provided relief.  See FIM for current functional status  Therapy/Group: Individual Therapy  [name] [facility] 05/29/2014, 3:40 PM

## 2014-05-29 NOTE — Progress Notes (Signed)
54 y.o. right handed limited English speaking refugee female with history of hypertension, diabetes mellitus with peripheral neuropathy. Presented 05/20/2014 with right fifth toe wound that occurred approximately 2 weeks ago after she hit her toe against a chair at home. Patient lives with her husband had been using a wheelchair recently due to right foot wound. The pain has progressively worsened with cellulitic changes. X-rays show no evidence of osteomyelitis. Noted soft tissue swelling. MRI of the right foot again showed soft tissue laceration involving the fifth toe. Patient with progressive ischemic changes. 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 Limb was not felt to be salvageable and underwent right BKA 05/23/2014 per Dr. Kellie Simmering. Hospital course pain management. Acute blood loss anemia 8.7 and monitored. Follow-up prosthetic care with [facility] prosthetics  Subjective/Complaints: Right shoulder pain Reviewed xray results with pt and husband with interpreter assist ROS: no phantom pain, only stump pain  Objective: Vital Signs: Blood pressure 156/57, pulse 61, temperature 98.7 F (37.1 C), temperature source Oral, resp. rate 18, weight 59 kg (130 lb 1.1 oz), SpO2 98 %. Dg Chest 2 View  05/27/2014   CLINICAL DATA:  Persistent cough.  Initial encounter.  EXAM: CHEST  2 VIEW  COMPARISON:  None.  FINDINGS: Cardiopericardial silhouette mildly enlarged. No airspace disease. No effusion. Lung volumes are lower than on prior, a which produces pulmonary vascular crowding. Mediastinal contours appear within normal limits allowing for lung volumes.  IMPRESSION: Low volume chest.  No acute cardiopulmonary disease.   Electronically Signed   By: Dereck Ligas M.D.   On: 05/27/2014 18:11   Dg Shoulder Right  05/28/2014   CLINICAL DATA:  Initial encounter for all right shoulder pain onset after surgery.  EXAM: RIGHT SHOULDER - 2+ VIEW  COMPARISON:  None.  FINDINGS: Bones are diffusely  demineralized. No evidence of fracture. No shoulder separation or dislocation. Degenerative cortical irregularity is seen at the rotator cuff insertion. There are degenerative changes in the acromioclavicular joint and undersurface spurring of the acromion.  IMPRESSION: Degenerative changes without acute bony findings.   Electronically Signed   By: Misty Stanley M.D.   On: 05/28/2014 14:47   Results for orders placed or performed during the hospital encounter of 05/26/14 (from the past 72 hour(s))  Glucose, capillary     Status: Abnormal   Collection Time: 05/26/14  9:49 PM  Result Value Ref Range   Glucose-Capillary 297 (H) 70 - 99 mg/dL  Glucose, capillary     Status: Abnormal   Collection Time: 05/27/14  6:36 AM  Result Value Ref Range   Glucose-Capillary 270 (H) 70 - 99 mg/dL  CBC WITH DIFFERENTIAL     Status: Abnormal   Collection Time: 05/27/14  8:55 AM  Result Value Ref Range   WBC 10.7 (H) 4.0 - 10.5 K/uL   RBC 3.59 (L) 3.87 - 5.11 MIL/uL   Hemoglobin 9.5 (L) 12.0 - 15.0 g/dL   HCT 28.5 (L) 36.0 - 46.0 %   MCV 79.4 78.0 - 100.0 fL   MCH 26.5 26.0 - 34.0 pg   MCHC 33.3 30.0 - 36.0 g/dL   RDW 13.2 11.5 - 15.5 %   Platelets 436 (H) 150 - 400 K/uL   Neutrophils Relative % 66 43 - 77 %   Neutro Abs 7.0 1.7 - 7.7 K/uL   Lymphocytes Relative 26 12 - 46 %   Lymphs Abs 2.8 0.7 - 4.0 K/uL   Monocytes Relative 7 3 - 12 %   Monocytes Absolute 0.7  0.1 - 1.0 K/uL   Eosinophils Relative 1 0 - 5 %   Eosinophils Absolute 0.1 0.0 - 0.7 K/uL   Basophils Relative 1 0 - 1 %   Basophils Absolute 0.1 0.0 - 0.1 K/uL  Comprehensive metabolic panel     Status: Abnormal   Collection Time: 05/27/14  8:55 AM  Result Value Ref Range   Sodium 139 135 - 145 mmol/L   Potassium 3.7 3.5 - 5.1 mmol/L   Chloride 100 96 - 112 mmol/L   CO2 32 19 - 32 mmol/L   Glucose, Bld 300 (H) 70 - 99 mg/dL   BUN <5 (L) 6 - 23 mg/dL   Creatinine, Ser 0.50 0.50 - 1.10 mg/dL   Calcium 8.9 8.4 - 10.5 mg/dL   Total  Protein 7.4 6.0 - 8.3 g/dL   Albumin 2.5 (L) 3.5 - 5.2 g/dL   AST 21 0 - 37 U/L   ALT 18 0 - 35 U/L   Alkaline Phosphatase 72 39 - 117 U/L   Total Bilirubin 0.4 0.3 - 1.2 mg/dL   GFR calc non Af Amer >90 >90 mL/min   GFR calc Af Amer >90 >90 mL/min    Comment: (NOTE) The eGFR has been calculated using the CKD EPI equation. This calculation has not been validated in all clinical situations. eGFR's persistently <90 mL/min signify possible Chronic Kidney Disease.    Anion gap 7 5 - 15  Glucose, capillary     Status: Abnormal   Collection Time: 05/27/14 11:54 AM  Result Value Ref Range   Glucose-Capillary 192 (H) 70 - 99 mg/dL  Glucose, capillary     Status: Abnormal   Collection Time: 05/27/14  4:17 PM  Result Value Ref Range   Glucose-Capillary 165 (H) 70 - 99 mg/dL  Glucose, capillary     Status: Abnormal   Collection Time: 05/27/14  8:43 PM  Result Value Ref Range   Glucose-Capillary 196 (H) 70 - 99 mg/dL  Urinalysis, Routine w reflex microscopic     Status: Abnormal   Collection Time: 05/27/14 11:00 PM  Result Value Ref Range   Color, Urine YELLOW YELLOW   APPearance CLEAR CLEAR   Specific Gravity, Urine 1.015 1.005 - 1.030   pH 7.0 5.0 - 8.0   Glucose, UA 100 (A) NEGATIVE mg/dL   Hgb urine dipstick NEGATIVE NEGATIVE   Bilirubin Urine NEGATIVE NEGATIVE   Ketones, ur NEGATIVE NEGATIVE mg/dL   Protein, ur NEGATIVE NEGATIVE mg/dL   Urobilinogen, UA 1.0 0.0 - 1.0 mg/dL   Nitrite NEGATIVE NEGATIVE   Leukocytes, UA NEGATIVE NEGATIVE    Comment: MICROSCOPIC NOT DONE ON URINES WITH NEGATIVE PROTEIN, BLOOD, LEUKOCYTES, NITRITE, OR GLUCOSE <1000 mg/dL.  Glucose, capillary     Status: Abnormal   Collection Time: 05/28/14  6:32 AM  Result Value Ref Range   Glucose-Capillary 218 (H) 70 - 99 mg/dL  Glucose, capillary     Status: Abnormal   Collection Time: 05/28/14 12:14 PM  Result Value Ref Range   Glucose-Capillary 262 (H) 70 - 99 mg/dL  Glucose, capillary     Status: Abnormal    Collection Time: 05/28/14  4:54 PM  Result Value Ref Range   Glucose-Capillary 173 (H) 70 - 99 mg/dL  Glucose, capillary     Status: Abnormal   Collection Time: 05/28/14  9:19 PM  Result Value Ref Range   Glucose-Capillary 150 (H) 70 - 99 mg/dL  Glucose, capillary     Status: Abnormal   Collection Time:  05/29/14  6:46 AM  Result Value Ref Range   Glucose-Capillary 193 (H) 70 - 99 mg/dL     HEENT: normal Cardio: RRR Resp: CTA B/L GI: BS positive Extremity:  Edema right BK stump Skin:   Wound C/D/I and Right BKA Neuro: Lethargic and Abnormal Motor 4/5 in BUE and LLE, R HF Musc/Skel:  Other short Right BKA, no excessive stump edema, pain with R shoulder abd and horz add Gen NAD   Assessment/Plan: 1. Functional deficits secondary to Right BKA which require 3+ hours per day of interdisciplinary therapy in a comprehensive inpatient rehab setting. Physiatrist is providing close team supervision and 24 hour management of active medical problems listed below. Physiatrist and rehab team continue to assess barriers to discharge/monitor patient progress toward functional and medical goals.  FIM: FIM - Bathing Bathing Steps Patient Completed: Chest, Right Arm, Left Arm, Abdomen, Front perineal area, Buttocks, Right upper leg, Left upper leg Bathing: 4: Min-Patient completes 8-9 29f 10 parts or 75+ percent  FIM - Upper Body Dressing/Undressing Upper body dressing/undressing steps patient completed: Thread/unthread right sleeve of pullover shirt/dresss, Thread/unthread left sleeve of pullover shirt/dress, Put head through opening of pull over shirt/dress, Pull shirt over trunk Upper body dressing/undressing: 5: Set-up assist to: Obtain clothing/put away FIM - Lower Body Dressing/Undressing Lower body dressing/undressing steps patient completed: Thread/unthread right pants leg, Thread/unthread left pants leg Lower body dressing/undressing: 2: Max-Patient completed 25-49% of tasks  FIM -  Toileting Toileting steps completed by patient: Performs perineal hygiene Toileting Assistive Devices: Grab bar or rail for support Toileting: 2: Max-Patient completed 1 of 3 steps  FIM - Radio producer Devices: Elevated toilet seat, Grab bars Toilet Transfers: 1-Two helpers  FIM - Control and instrumentation engineer Devices: Bed rails Bed/Chair Transfer: 1: Two helpers  FIM - Locomotion: Wheelchair Distance: 150; required intermittent hand over hand assistance for steering/changing direction; educated on parts management for set up prior to transfer.   Locomotion: Wheelchair: 1: Total Assistance/staff pushes wheelchair (Pt<25%) FIM - Locomotion: Ambulation Locomotion: Ambulation Assistive Devices: Walker - Rolling Ambulation/Gait Assistance: 3: Mod assist Locomotion: Ambulation: 1: Travels less than 50 ft with moderate assistance (Pt: 50 - 74%)  Comprehension Comprehension Mode: Not assessed Comprehension:  (nonenglish speaking )  Expression Expression Mode: Not assessed Expression:  (nonenglish speaking)  Social Interaction Social Interaction Mode: Not assessed Social Interaction:  (nonenglish speaking)  Problem Solving Problem Solving Mode: Not assessed Problem Solving:  (nonenglish speaking)  Memory Memory Mode: Not assessed Memory:  (nonenglish speaking)  Medical Problem List and Plan: 1. Functional deficits secondary to right BKA secondary to PVD 2. DVT Prophylaxis/Anticoagulation: Subcutaneous heparin. Monitor platelet counts for any signs of bleeding 3. Pain Management: Oxycodone as needed. Monitor with increased mobility, add low dose oxycontin $RemoveBefore'10mg'[id number]$  BID, shoulder pain AC joint , rotator cuff arthropathy, add voltaren gel 4. Acute blood loss anemia. Follow-up CBC 5. Neuropsych: This patient is capable of making decisions on her own behalf. 6. Skin/Wound Care: Routine skin checks 7. Fluids/Electrolytes/Nutrition: Strict I  and O follow-up chemistries 8. Diabetes mellitus and peripheral neuropathy. Hemoglobin A1c 9.6. Lantus insulin 20 units daily. Check blood sugars before meals and at bedtime 9. Hypertension. Lisinopril 20 mg daily, Norvasc 10 mg daily. Monitor with increased mobility 10. Hyperlipidemia. Lipitor2 11. Constipation. Adjust bowel program 12.  Leukocytosis,resolved low grade temp resolved , - CXR and UA, likely atelectasis, enc IS LOS (Days) 3 A FACE TO FACE EVALUATION WAS PERFORMED  KIRSTEINS,ANDREW E 05/29/2014, 8:17  AM    

## 2014-05-29 NOTE — Progress Notes (Signed)
Social Work Assessment and Plan  Patient Details  Name: Kimberly Lane MRN: [id number] Date of Birth: May 16, 1960  Today's Date: 05/29/2014  Problem List:  Patient Active Problem List   Diagnosis Date Noted  . Cough   . Hx of right BKA 05/26/2014  . S/P BKA (below knee amputation) unilateral   . Abnormal EKG   . Preop cardiovascular exam 05/21/2014  . Cellulitis of toe of right foot   . Peripheral arterial disease   . Cellulitis 05/13/2014  . Type 2 diabetes mellitus with complication   . Cellulitis and abscess of toe   . HLD (hyperlipidemia)   . Pure hypercholesterolemia 11/14/2013  . High risk social situation 11/13/2013  . Back pain without radiation 09/05/2011    Class: Acute  . Osteoarthritis of left hip 05/31/2011  . History of primary TB 04/26/2011  . DM (diabetes mellitus), type 2, uncontrolled 05/17/2010  . Essential hypertension 12/09/2009   Past Medical History:  Past Medical History  Diagnosis Date  . Diabetes mellitus   . Hypertension   . Cellulitis and abscess of foot 05/13/2014    rt foot  . Positive TB test 2013   Past Surgical History:  Past Surgical History  Procedure Laterality Date  . Abdominal aortagram N/A 05/20/2014    Procedure: ABDOMINAL Maxcine Ham;  Surgeon: Serafina Mitchell, MD;  Location: [facility] CATH LAB;  Service: Cardiovascular;  Laterality: N/A;  . Amputation Right 05/23/2014    Procedure: AMPUTATION BELOW KNEE;  Surgeon: Mal Misty, MD;  Location: [facility];  Service: Vascular;  Laterality: Right;   Social History:  reports that she has never smoked. She has never used smokeless tobacco. She reports that she does not drink alcohol or use illicit drugs.  Family / Support Systems Marital Status: Married How Long?: 17 years Patient Roles: Spouse, Other (Comment) (student at [employer] - taking English classes) Spouse/Significant Other: Kimberly Lane - spouse - [phone];  [phone] Other Supports: Kimberly Lane - friend - [phone];  [name] - [phone];  [name] - [phone] Anticipated Caregiver: husband and supportive neighbor/family Ability/Limitations of Caregiver: no limitations (husband is no longer living in [place]) Caregiver Availability: 24/7 Family Dynamics: close, supportive relationships  Social History Preferred language: Arabic Religion: Muslim Cultural Background: Pt came to the [place] from [place] about 4 years ago Education: taking English classes Employment Status: Unemployed Public relations account executive Issues: none reported Guardian/Conservator: N/A   Abuse/Neglect Physical Abuse: Denies Verbal Abuse: Denies Sexual Abuse: Denies Exploitation of patient/patient's resources: Denies Self-Neglect: Denies  Emotional Status Pt's affect, behavior and adjustment status: Pt seemed flat and quiet with CSW, but [name] from [facility] states that pt is in better spirits than she has been. Recent Psychosocial Issues: Pt's husband moved back to [place] from [place] and will look for work here once pt is stable. Psychiatric History: none reported Substance Abuse History: none reported  Patient / Family Perceptions, Expectations & Goals Pt/family expectations/goals: Pt wants to get well and get home.  Community Resources [employer]: Other (Comment) ([place]; [facility] [employer]; [place] [facility]) Premorbid Home Care/DME Agencies: None Transportation available at discharge: community supports  Discharge Planning Living Arrangements: Spouse/significant other, Other relatives, Non-relatives/Friends Support Systems: Spouse/significant other, Friends/neighbors, Immunologist, Case manager/social worker Case Manager/Social Worker Name: see above Type of Residence: Private residence Insurance Resources: Self-pay (pt has applied for [employer] and social security disability) Museum/gallery curator Resources: Family  Support, Other (Comment) Financial Screen Referred: Previously completed Money Management: Patient, Spouse Does the patient have any problems obtaining your medications?: Yes (Describe) (pt is a pt at [facility] and should be able to access medications there, but Kimberly Lane reports this has not worked out a few times in the past.) Home Management: Pt and husband Patient/Family Preliminary Plans: Pt to return home with her husband and community supports.  CSW will arrange DME so that pt can make her two level townhome work. Barriers to Discharge: Steps Social Work Anticipated Follow Up Needs: HH/OP, Support Group Expected length of stay: 8-12 days Clinical Impression CSW met with pt and her husband on 05-27-14 with the interpreter to introduce self and role of CSW, as well as to complete assessment.  Pt was quiet, but polite.  She reported some pain and CSW passed this on to medical team.  Pt and husband feel comfortable with pt going home at d/c, however their home is not handicap accessible with all bedrooms and toilet up a flight of stairs.  Pt's husband to speak with [facility] to see if they can assist them in getting a one level home.  CSW will arrange DME and f/u care and keep in touch with community support agencies that help and know pt.  No current concerns/questions/needs about rehab stay noted.  Theodoros Stjames, Silvestre Mesi 05/29/2014, 1:48 PM

## 2014-05-29 NOTE — Progress Notes (Signed)
Inpatient Diabetes Program Recommendations  AACE/ADA: New Consensus Statement on Inpatient Glycemic Control (2013)  Target Ranges:  Prepandial:   less than 140 mg/dL      Peak postprandial:   less than 180 mg/dL (1-2 hours)      Critically ill patients:  140 - 180 mg/dL   Inpatient Diabetes Program Recommendations Insulin - Basal: Increase Lantus to 25 units  Correction (SSI): add HS scale per Glycemic Control order-set Insulin - Meal Coverage: . Thank you  Raoul Pitch BSN, RN,CDE Inpatient Diabetes Coordinator [phone] (team pager)

## 2014-05-29 NOTE — IPOC Note (Signed)
Overall Plan of Care [facility]) Patient Details Name: Kimberly Lane MRN: [id number] DOB: 1960/07/06  Admitting Diagnosis: R BKA  Hospital Problems: Active Problems:   Hx of right BKA   Cough     Functional Problem List: Nursing    PT Balance, Endurance, Motor, Pain, Sensory  OT Balance, Endurance, Motor, Safety  SLP    TR         Basic ADL's: OT Grooming, Bathing, Dressing, Toileting     Advanced  ADL's: OT       Transfers: PT Bed to Chair, Bed Mobility, Car, Manufacturing systems engineer, Metallurgist: PT Ambulation, Emergency planning/management officer     Additional Impairments: OT    SLP        TR      Anticipated Outcomes Item Anticipated Outcome  Self Feeding Mod I  Swallowing      Basic self-care  Media planner Transfers Supervision  Bowel/Bladder     Transfers  Mod I  Locomotion  Supervision; w/c mod I  Communication     Cognition     Pain     Safety/Judgment      Therapy Plan: PT Intensity: Minimum of 1-2 x/day ,45 to 90 minutes PT Frequency: 5 out of 7 days PT Duration Estimated Length of Stay: 7-9 days OT Intensity: Minimum of 1-2 x/day, 45 to 90 minutes OT Frequency: 5 out of 7 days OT Duration/Estimated Length of Stay: 8-12 days         Team Interventions: Nursing Interventions    PT interventions Ambulation/gait training, Training and development officer, Discharge planning, Disease management/prevention, DME/adaptive equipment instruction, Functional mobility training, Pain management, Patient/family education, Stair training, Therapeutic Activities, Therapeutic Exercise, UE/LE Strength taining/ROM, Wheelchair propulsion/positioning  OT Interventions Training and development officer, Discharge planning, Disease mangement/prevention, DME/adaptive equipment instruction, Functional mobility training, Pain management, Patient/family education, Psychosocial support, Self Care/advanced ADL retraining, Skin care/wound  managment, Splinting/orthotics, Therapeutic Activities, Therapeutic Exercise, UE/LE Strength taining/ROM  SLP Interventions    TR Interventions    SW/CM Interventions Discharge Planning, Psychosocial Support, Patient/Family Education    Team Discharge Planning: Destination: PT-Home ,OT- Home , SLP-  Projected Follow-up: PT-Home health PT, 24 hour supervision/assistance, OT-  Home health OT, SLP-  Projected Equipment Needs: PT-Wheelchair (measurements), Wheelchair cushion (measurements), Rolling walker with 5" wheels, OT- 3 in 1 bedside comode, Tub/shower bench, SLP-  Equipment Details: PT- , OT-  Patient/family involved in discharge planning: PT- Patient, Family member/caregiver,  OT-Patient, Family member/caregiver, SLP-   MD ELOS: 7-10d Medical Rehab Prognosis:  Good Assessment: 54 y.o. right handed limited English speaking refugee female with history of hypertension, diabetes mellitus with peripheral neuropathy. Presented 05/20/2014 with right fifth toe wound that occurred approximately 2 weeks ago after she hit her toe against a chair at home. Patient lives with her husband had been using a wheelchair recently due to right foot wound. The pain has progressively worsened with cellulitic changes. X-rays show no evidence of osteomyelitis. Noted soft tissue swelling. MRI of the right foot again showed soft tissue laceration involving the fifth toe. Patient with progressive ischemic changes. 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 Limb was not felt to be salvageable and underwent right BKA 05/23/2014 per Dr. Kellie Simmering   Now requiring 24/7 Rehab RN,MD, as well as CIR level PT, OT and SLP.  Treatment team will focus on ADLs and mobility with goals set at [facility] I/Sup  See Team Conference Notes for weekly updates to the plan of  care 

## 2014-05-29 NOTE — Patient Care Conference (Signed)
Inpatient RehabilitationTeam Conference and Plan of Care Update Date: 05/28/2014   Time: 10:40 AM    Patient Name: Kimberly Lane      Medical Record Number: [id number]  Date of Birth: [birth year] Sex: Female         Room/Bed: 4W20C/4W20C-01 Payor Info: Payor: [employer] DISCOUNT / Plan: [employer] DISCOUNT 100% / Product Type: *No Product type* /    Admitting Diagnosis: R BKA  Admit Date/Time:  05/26/2014  5:23 PM Admission Comments: No comment available   Primary Diagnosis:  <principal problem not specified> Principal Problem: <principal problem not specified>  Patient Active Problem List   Diagnosis Date Noted  . Cough   . Hx of right BKA 05/26/2014  . S/P BKA (below knee amputation) unilateral   . Abnormal EKG   . Preop cardiovascular exam 05/21/2014  . Cellulitis of toe of right foot   . Peripheral arterial disease   . Cellulitis 05/13/2014  . Type 2 diabetes mellitus with complication   . Cellulitis and abscess of toe   . HLD (hyperlipidemia)   . Pure hypercholesterolemia 11/14/2013  . High risk social situation 11/13/2013  . Back pain without radiation 09/05/2011    Class: Acute  . Osteoarthritis of left hip 05/31/2011  . History of primary TB 04/26/2011  . DM (diabetes mellitus), type 2, uncontrolled 05/17/2010  . Essential hypertension 12/09/2009    Expected Discharge Date: Expected Discharge Date: 06/04/14  Team Members Present: Physician leading conference: Dr. Alysia Penna Social Worker Present: Alfonse Alpers, LCSW Nurse Present: Heather Roberts, RN PT Present: Georjean Mode, PT;Blair Hobble, PT OT Present: Meriel Pica, Jules Schick, OT SLP Present: Windell Moulding, SLP PPS Coordinator present : Daiva Nakayama, RN, CRRN     Current Status/Progress Goal Weekly Team Focus  Medical   stump pain as well as right shoulsder pain with reduced ROM  Home with family assist  pain med adjustment   Bowel/Bladder   Urinary retention requires in and out cath. LBM 4/6  Continue PVR, and  encouraging double voids to empty  Reduce need for in and out cathing, and continue to monitor PVRs   Swallow/Nutrition/ Hydration             ADL's   mod assist transfers, min-mod assist standing balance for bathing and LB dressing  supervision transfers and standing tasks, mod I seated tasks  pt and family education, transfers, standing balance   Mobility   Supervision bed mobility, Mod A transfers, Mod A ambulation up to 16' with rolling walker; limited by pain in R shoulder  Mod I at w/c level; supervision ambulation for household distances  Activity tolerance, functional strengthening, transfers, address R shoulder pain, w/c parts management, ambulation, increase muscle extensibility in R hip/knee, initiate family education   Communication             Safety/Cognition/ Behavioral Observations            Pain   Pain (R) BKA, and (R) shoulder  Pre-medicate prior to therapy session, and PRN  Pre-medicate prior to therapy session   Skin   (R) BKA incision with staples  Monitor incision q shift and PRN  Assess incision q shift, and Ace wrap daily    Rehab Goals Patient on target to meet rehab goals: Yes Rehab Goals Revised: none-first conference *See Care Plan and progress notes for long and short-term goals.  Barriers to Discharge: Pain    Possible Resolutions to Barriers:  See above    Discharge Planning/Teaching Needs:  Pt will return to her home with her husband to assist her as needed.  Pt does not have a toilet or bedroom on main floor, so we will plan accordingly.  Pt's husband is present daily for therapies.  Interpreter is also here for therapies, so family education can occur during these times.   Team Discussion:  Pt has some pain issues and MD is scheduling pain medications to better cover the pain.  Pt will still have options for breakthrough pain.  Dr. Letta Pate will also have her shoulder x-rayed to find possible cause of pain.  Pt is on track to meet mod I goals  with supervision for standing.  Pt will need f/u therapies and DME.  CSW to arrange.  Revisions to Treatment Plan:  none   Continued Need for Acute Rehabilitation Level of Care: The patient requires daily medical management by a physician with specialized training in physical medicine and rehabilitation for the following conditions: Daily direction of a multidisciplinary physical rehabilitation program to ensure safe treatment while eliciting the highest outcome that is of practical value to the patient.: Yes Daily medical management of patient stability for increased activity during participation in an intensive rehabilitation regime.: Yes Daily analysis of laboratory values and/or radiology reports with any subsequent need for medication adjustment of medical intervention for : Other;Post surgical problems  Anamaria Dusenbury, Silvestre Mesi 05/29/2014, 12:23 PM

## 2014-05-29 NOTE — Progress Notes (Signed)
Social Work Patient ID: Kimberly Lane, female   DOB: Nov 05, 1960, 54 y.o.   MRN: [id number]   CSW met with pt and husband with the interpreter to update them on team conference discussion.  They were pleased with d/c date of 06-03-14 and husband feels she will be ready by then.  CSW also spoke with support person [place] for [employer] and she is aware of pending d/c date.  She will start putting other supports into place for pt.  She also wondered about pt accounting assistance with bill.  CSW called pt acct and learned that pt needs to apply for this assistance every 6 months.  CSW to pass this on to [place], as she has helped them with it in the past.  CSW will continue to follow and assist as needed.

## 2014-05-30 ENCOUNTER — Inpatient Hospital Stay (HOSPITAL_COMMUNITY): Payer: Self-pay | Admitting: Physical Therapy

## 2014-05-30 ENCOUNTER — Inpatient Hospital Stay (HOSPITAL_COMMUNITY): Payer: Self-pay | Admitting: Occupational Therapy

## 2014-05-30 ENCOUNTER — Inpatient Hospital Stay (HOSPITAL_COMMUNITY): Payer: No Typology Code available for payment source | Admitting: *Deleted

## 2014-05-30 DIAGNOSIS — Z89511 Acquired absence of right leg below knee: Secondary | ICD-10-CM | POA: Diagnosis not present

## 2014-05-30 DIAGNOSIS — R05 Cough: Secondary | ICD-10-CM | POA: Diagnosis not present

## 2014-05-30 LAB — GLUCOSE, CAPILLARY
GLUCOSE-CAPILLARY: 231 mg/dL — AB (ref 70–99)
Glucose-Capillary: 189 mg/dL — ABNORMAL HIGH (ref 70–99)
Glucose-Capillary: 262 mg/dL — ABNORMAL HIGH (ref 70–99)
Glucose-Capillary: 134 mg/dL — ABNORMAL HIGH (ref 70–99)

## 2014-05-30 NOTE — Progress Notes (Signed)
Subjective/Complaints: No issues overnite ROS: no phantom pain, only stump pain  Objective: Vital Signs: Blood pressure 159/68, pulse 76, temperature 98.9 F (37.2 C), temperature source Oral, resp. rate 18, weight 59 kg (130 lb 1.1 oz), SpO2 99 %. Dg Shoulder Right  05/28/2014   CLINICAL DATA:  Initial encounter for all right shoulder pain onset after surgery.  EXAM: RIGHT SHOULDER - 2+ VIEW  COMPARISON:  None.  FINDINGS: Bones are diffusely demineralized. No evidence of fracture. No shoulder separation or dislocation. Degenerative cortical irregularity is seen at the rotator cuff insertion. There are degenerative changes in the acromioclavicular joint and undersurface spurring of the acromion.  IMPRESSION: Degenerative changes without acute bony findings.   Electronically Signed   By: Misty Stanley M.D.   On: 05/28/2014 14:47   Results for orders placed or performed during the hospital encounter of 05/26/14 (from the past 72 hour(s))  CBC WITH DIFFERENTIAL     Status: Abnormal   Collection Time: 05/27/14  8:55 AM  Result Value Ref Range   WBC 10.7 (H) 4.0 - 10.5 K/uL   RBC 3.59 (L) 3.87 - 5.11 MIL/uL   Hemoglobin 9.5 (L) 12.0 - 15.0 g/dL   HCT 28.5 (L) 36.0 - 46.0 %   MCV 79.4 78.0 - 100.0 fL   MCH 26.5 26.0 - 34.0 pg   MCHC 33.3 30.0 - 36.0 g/dL   RDW 13.2 11.5 - 15.5 %   Platelets 436 (H) 150 - 400 K/uL   Neutrophils Relative % 66 43 - 77 %   Neutro Abs 7.0 1.7 - 7.7 K/uL   Lymphocytes Relative 26 12 - 46 %   Lymphs Abs 2.8 0.7 - 4.0 K/uL   Monocytes Relative 7 3 - 12 %   Monocytes Absolute 0.7 0.1 - 1.0 K/uL   Eosinophils Relative 1 0 - 5 %   Eosinophils Absolute 0.1 0.0 - 0.7 K/uL   Basophils Relative 1 0 - 1 %   Basophils Absolute 0.1 0.0 - 0.1 K/uL  Comprehensive metabolic panel     Status: Abnormal   Collection Time: 05/27/14  8:55 AM  Result Value Ref Range   Sodium 139 135 - 145 mmol/L   Potassium 3.7 3.5 - 5.1 mmol/L   Chloride 100 96 - 112 mmol/L   CO2 32 19 - 32  mmol/L   Glucose, Bld 300 (H) 70 - 99 mg/dL   BUN <5 (L) 6 - 23 mg/dL   Creatinine, Ser 0.50 0.50 - 1.10 mg/dL   Calcium 8.9 8.4 - 10.5 mg/dL   Total Protein 7.4 6.0 - 8.3 g/dL   Albumin 2.5 (L) 3.5 - 5.2 g/dL   AST 21 0 - 37 U/L   ALT 18 0 - 35 U/L   Alkaline Phosphatase 72 39 - 117 U/L   Total Bilirubin 0.4 0.3 - 1.2 mg/dL   GFR calc non Af Amer >90 >90 mL/min   GFR calc Af Amer >90 >90 mL/min    Comment: (NOTE) The eGFR has been calculated using the CKD EPI equation. This calculation has not been validated in all clinical situations. eGFR's persistently <90 mL/min signify possible Chronic Kidney Disease.    Anion gap 7 5 - 15  Glucose, capillary     Status: Abnormal   Collection Time: 05/27/14 11:54 AM  Result Value Ref Range   Glucose-Capillary 192 (H) 70 - 99 mg/dL  Glucose, capillary     Status: Abnormal   Collection Time: 05/27/14  4:17 PM  Result Value Ref Range   Glucose-Capillary 165 (H) 70 - 99 mg/dL  Glucose, capillary     Status: Abnormal   Collection Time: 05/27/14  8:43 PM  Result Value Ref Range   Glucose-Capillary 196 (H) 70 - 99 mg/dL  Urinalysis, Routine w reflex microscopic     Status: Abnormal   Collection Time: 05/27/14 11:00 PM  Result Value Ref Range   Color, Urine YELLOW YELLOW   APPearance CLEAR CLEAR   Specific Gravity, Urine 1.015 1.005 - 1.030   pH 7.0 5.0 - 8.0   Glucose, UA 100 (A) NEGATIVE mg/dL   Hgb urine dipstick NEGATIVE NEGATIVE   Bilirubin Urine NEGATIVE NEGATIVE   Ketones, ur NEGATIVE NEGATIVE mg/dL   Protein, ur NEGATIVE NEGATIVE mg/dL   Urobilinogen, UA 1.0 0.0 - 1.0 mg/dL   Nitrite NEGATIVE NEGATIVE   Leukocytes, UA NEGATIVE NEGATIVE    Comment: MICROSCOPIC NOT DONE ON URINES WITH NEGATIVE PROTEIN, BLOOD, LEUKOCYTES, NITRITE, OR GLUCOSE <1000 mg/dL.  Glucose, capillary     Status: Abnormal   Collection Time: 05/28/14  6:32 AM  Result Value Ref Range   Glucose-Capillary 218 (H) 70 - 99 mg/dL  Glucose, capillary     Status:  Abnormal   Collection Time: 05/28/14 12:14 PM  Result Value Ref Range   Glucose-Capillary 262 (H) 70 - 99 mg/dL  Glucose, capillary     Status: Abnormal   Collection Time: 05/28/14  4:54 PM  Result Value Ref Range   Glucose-Capillary 173 (H) 70 - 99 mg/dL  Glucose, capillary     Status: Abnormal   Collection Time: 05/28/14  9:19 PM  Result Value Ref Range   Glucose-Capillary 150 (H) 70 - 99 mg/dL  Glucose, capillary     Status: Abnormal   Collection Time: 05/29/14  6:46 AM  Result Value Ref Range   Glucose-Capillary 193 (H) 70 - 99 mg/dL  Glucose, capillary     Status: Abnormal   Collection Time: 05/29/14 12:06 PM  Result Value Ref Range   Glucose-Capillary 226 (H) 70 - 99 mg/dL  Glucose, capillary     Status: Abnormal   Collection Time: 05/29/14  5:04 PM  Result Value Ref Range   Glucose-Capillary 130 (H) 70 - 99 mg/dL  Glucose, capillary     Status: Abnormal   Collection Time: 05/29/14  9:25 PM  Result Value Ref Range   Glucose-Capillary 184 (H) 70 - 99 mg/dL  Glucose, capillary     Status: Abnormal   Collection Time: 05/30/14  6:52 AM  Result Value Ref Range   Glucose-Capillary 189 (H) 70 - 99 mg/dL     HEENT: normal Cardio: RRR Resp: CTA B/L GI: BS positive Extremity:  Edema right BK stump Skin:   Wound C/D/I and Right BKA Neuro: Lethargic and Abnormal Motor 4/5 in BUE and LLE, R HF Musc/Skel:  Other short Right BKA, no excessive stump edema, pain with R shoulder abd and horz add Gen NAD   Assessment/Plan: 1. Functional deficits secondary to Right BKA which require 3+ hours per day of interdisciplinary therapy in a comprehensive inpatient rehab setting. Physiatrist is providing close team supervision and 24 hour management of active medical problems listed below. Physiatrist and rehab team continue to assess barriers to discharge/monitor patient progress toward functional and medical goals.  FIM: FIM - Bathing Bathing Steps Patient Completed: Chest, Right Arm,  Left Arm, Abdomen, Front perineal area, Buttocks, Right upper leg, Left upper leg Bathing: 4: Min-Patient completes 8-9 [id number] parts or  75+ percent  FIM - Upper Body Dressing/Undressing Upper body dressing/undressing steps patient completed: Thread/unthread right sleeve of pullover shirt/dresss, Thread/unthread left sleeve of pullover shirt/dress, Put head through opening of pull over shirt/dress, Pull shirt over trunk Upper body dressing/undressing: 5: Set-up assist to: Obtain clothing/put away FIM - Lower Body Dressing/Undressing Lower body dressing/undressing steps patient completed: Thread/unthread right pants leg, Thread/unthread left pants leg, Pull pants up/down Lower body dressing/undressing: 3: Mod-Patient completed 50-74% of tasks  FIM - Toileting Toileting steps completed by patient: Adjust clothing prior to toileting, Performs perineal hygiene, Adjust clothing after toileting Toileting Assistive Devices: Grab bar or rail for support Toileting: 4: Steadying assist  FIM - Radio producer Devices: Elevated toilet seat, Grab bars Toilet Transfers: 3-To toilet/BSC: Mod A (lift or lower assist), 3-From toilet/BSC: Mod A (lift or lower assist)  FIM - Control and instrumentation engineer Devices: Bed rails Bed/Chair Transfer: 3: Bed > Chair or W/C: Mod A (lift or lower assist), 3: Chair or W/C > Bed: Mod A (lift or lower assist), 5: Supine > Sit: Supervision (verbal cues/safety issues), 5: Sit > Supine: Supervision (verbal cues/safety issues)  FIM - Locomotion: Wheelchair Distance: 150 Locomotion: Wheelchair: 4: Travels 150 ft or more: maneuvers on rugs and over door sillls with minimal assistance (Pt.>75%) FIM - Locomotion: Ambulation Locomotion: Ambulation Assistive Devices: Administrator Ambulation/Gait Assistance: 3: Mod assist Locomotion: Ambulation: 0: Activity did not occur  Comprehension Comprehension Mode: Not  assessed Comprehension:  (non english speaking )  Expression Expression Mode: Not assessed Expression:  (doesnt speak english )  Social Interaction Social Interaction Mode: Not assessed Social Interaction:  (nonenglish speaking)  Problem Solving Problem Solving Mode: Not assessed Problem Solving:  (doesnt speak english )  Memory Memory Mode: Not assessed Memory:  (doesnt speak english )  Medical Problem List and Plan: 1. Functional deficits secondary to right BKA secondary to PVD 2. DVT Prophylaxis/Anticoagulation: Subcutaneous heparin. Monitor platelet counts for any signs of bleeding 3. Pain Management: Oxycodone as needed. Monitor with increased mobility, add low dose oxycontin 35m BID, shoulder pain AC joint , rotator cuff arthropathy, add voltaren gel 4. Acute blood loss anemia. Follow-up CBC 5. Neuropsych: This patient is capable of making decisions on her own behalf. 6. Skin/Wound Care: Routine skin checks 7. Fluids/Electrolytes/Nutrition: Strict I and O follow-up chemistries 8. Diabetes mellitus and peripheral neuropathy. Hemoglobin A1c 9.6. Lantus insulin 20 units daily. Check blood sugars before meals and at bedtime 9. Hypertension. Lisinopril 20 mg daily, Norvasc 10 mg daily. Monitor with increased mobility 10. Hyperlipidemia. Lipitor2 11. Constipation. Adjust bowel program 12.  Leukocytosis,resolved low grade temp resolved , - CXR and UA, likely atelectasis, enc IS LOS (Days) 4 A FACE TO FACE EVALUATION WAS PERFORMED  Emmaclaire Switala E 05/30/2014, 8:04 AM

## 2014-05-30 NOTE — Progress Notes (Signed)
Physical Therapy Session Note  Patient Details  Name: Kimberly Lane MRN: [id number] Date of Birth: 03-13-1960  Today's Date: 05/30/2014 PT Individual Time: 1110-1215 PT Individual Time Calculation (min): 65 min   Short Term Goals: Week 1:  PT Short Term Goal 1 (Week 1): = LTG secondary to short LOS  Skilled Therapeutic Interventions/Progress Updates:    Pt received seated in w/c accompanied by translator having just completed prior PT session. Pt reporting increased fatigue and increased chronic pain in R shoulder but agreeable to therapy. Moist heat effective in reducing R shoulder pain during this session. Transported pt to ortho gym in w/c with total A due to shoulder pain. Pt performed squat pivot transfer from w/c <> level mat table with close supervision, 50% cueing for w/c parts management and setup. See below for detailed description of therapeutic exercises. Paper handout provided to facilitate pt carryover of home exercise program; translator agreeable to translating into Arabic and providing to patient. Pt positioned in prone during all rest breaks to increase R hip flexor extensibility. Transported pt to outside hospital lobby, where pt performed w/c mobility x150' in community environment with supervision for majority of trial but requiring min A to traverse standard ramp. Session ended in pt room, where pt was left seated in w/c with husband and interpreter present and all needs within reach.   Therapy Documentation Precautions:  Precautions Precautions: Fall Precaution Comments: Painful R shoulder. Restrictions Weight Bearing Restrictions: Yes RLE Weight Bearing: Non weight bearing Vital Signs: Therapy Vitals Pulse Rate: 77 BP: 138/65 mmHg Patient Position (if appropriate): Sitting Pain: Pain Assessment Pain Assessment: 0-10 Pain Score: 7  Pain Type: Chronic pain Pain Location: Shoulder Pain Orientation: Right Pain Descriptors / Indicators: Aching Pain Onset: With  Activity Pain Intervention(s): Heat applied Mobility:   Locomotion : Ambulation Ambulation/Gait Assistance: 4: Min guard Wheelchair Mobility Distance: 150  Exercises: Pt performed the following therapeutic exercises (to pt fatigue) to increase functional LE strength in R residual limb: supine isometric quadriceps contraction x10 reps (x3-second holds); supine active hip/knee flexion x5 reps; R hip extension with concurrent knee extension x12 reps; R SAQ x10 reps (x3-second holds). Paper handout provided to facilitate pt carryover; translator agreeable to translating into Arabic and providing to patient.  See FIM for current functional status  Therapy/Group: Individual Therapy  Hobble, Malva Cogan 05/30/2014, 12:29 PM

## 2014-05-30 NOTE — Progress Notes (Signed)
Occupational Therapy Session Note  Patient Details  Name: Kimberly Lane MRN: [id number] Date of Birth: 31-May-1960  Today's Date: 05/30/2014 OT Individual Time: 0830-1000 OT Individual Time Calculation (min): 90 min    Short Term Goals: Week 1:  OT Short Term Goal 1 (Week 1): STG = LTGs due to ELOS  Skilled Therapeutic Interventions/Progress Updates:    Engaged in ADL retraining and hands on family education with husband.  Pt in bed upon arrival, performed squat pivot transfer bed > w/c with mod assist with cues for hand placement to increase safety and ease of transfer.  Bathing and dressing completed at sit > stand level from toilet with pt completing all tasks with supervision/setup except min assist for standing balance while pulling pants over hips.  In ADL apt, engaged in education regarding drop arm BSC for toilet transfers at home as they may not have access to one story home upon d/c (current home bathroom is upstairs).  Educated pt and pt's husband on squat pivot transfers w/c <> drop arm BSC, lateral leans for clothing management and hygiene, as well as stand pivot transfers with RW and standing for clothing management.  Pt's husband return demonstrated both transfers, requiring min verbal cues for appropriate placement during ambulation.  Pt propelled w/c back to room at end of session, with min-mod verbal cues for sequencing with turns.    Therapy Documentation Precautions:  Precautions Precautions: Fall Precaution Comments: Painful R shoulder. Restrictions Weight Bearing Restrictions: Yes RLE Weight Bearing: Non weight bearing General:   Vital Signs: Therapy Vitals BP: 129/90 mmHg Pain:   Pt reports pain in residual limb, RN aware providing pain meds at beginning of session  See FIM for current functional status  Therapy/Group: Individual Therapy  Simonne Come 05/30/2014, 10:35 AM

## 2014-05-30 NOTE — Progress Notes (Signed)
Physical Therapy Session Note  Patient Details  Name: Kimberly Lane MRN: [id number] Date of Birth: December 23, 1960  Today's Date: 05/30/2014 PT Individual Time: 1020-1110 PT Individual Time Calculation (min): 50 min   Short Term Goals: Week 1:  PT Short Term Goal 1 (Week 1): = LTG secondary to short LOS  Skilled Therapeutic Interventions/Progress Updates:  Tx focused on functional mobility training, gait with RW, therex for strength and positioning and NMR for functional balance training.  Hot pack provided after for shoulder pain.   Pt propelled WC 2x150' with up to [place] for managing turns. PT challenged with cone weaving to practice turns.  Pt needs WC set-up assist for transfers including parts management and safety.  Squat-pivot transfers performed x4 WC<>mat with Min>>S and safety cues to complete turn.  Pt performed sit<>stand transfers with Min A overall to steady. Pt able to perform static and dynamic standing balance tasks in RW with min-guard A, tolerance limited by shoulder pain.   Dynamic reaching tasks unsupported sitting EOM with LUE, S and good trunk excursion. Pt unable to tolerate challenging core ex at mat due to shoulder pain.   Seated/standing therex for R hip strength 2x10 each of the following:  LAQ Hip ABD Hip EXT Hip FLX  Gait training with RW 3x30' with min A and demo/cues for extended elbow technique. Once trial included cone weaving to practice turns. Pt unable to clear LLE significantly during gait.   Handoff to PT.      Therapy Documentation Precautions:  Precautions Precautions: Fall Precaution Comments: Painful R shoulder. Restrictions Weight Bearing Restrictions: Yes RLE Weight Bearing: Non weight bearing General:   Vital Signs: Therapy Vitals Pulse Rate: 77 BP: 138/65 mmHg Patient Position (if appropriate): Sitting Pain: 7/10 R shoulder, provided heat pack and rest breaks    Locomotion : Ambulation Ambulation/Gait Assistance: 4: Min  guard Wheelchair Mobility Distance: 150   See FIM for current functional status  Therapy/Group: Individual Therapy  Kennieth Rad, PT, DPT   05/30/2014, 11:03 AM

## 2014-05-31 ENCOUNTER — Inpatient Hospital Stay (HOSPITAL_COMMUNITY): Payer: Self-pay | Admitting: Occupational Therapy

## 2014-05-31 ENCOUNTER — Inpatient Hospital Stay (HOSPITAL_COMMUNITY): Payer: Self-pay | Admitting: Physical Therapy

## 2014-05-31 DIAGNOSIS — G629 Polyneuropathy, unspecified: Secondary | ICD-10-CM | POA: Diagnosis not present

## 2014-05-31 DIAGNOSIS — E1142 Type 2 diabetes mellitus with diabetic polyneuropathy: Secondary | ICD-10-CM | POA: Diagnosis not present

## 2014-05-31 DIAGNOSIS — Z89511 Acquired absence of right leg below knee: Secondary | ICD-10-CM | POA: Diagnosis not present

## 2014-05-31 LAB — GLUCOSE, CAPILLARY
GLUCOSE-CAPILLARY: 359 mg/dL — AB (ref 70–99)
GLUCOSE-CAPILLARY: 135 mg/dL — AB (ref 70–99)
Glucose-Capillary: 188 mg/dL — ABNORMAL HIGH (ref 70–99)
Glucose-Capillary: 154 mg/dL — ABNORMAL HIGH (ref 70–99)

## 2014-05-31 NOTE — Plan of Care (Signed)
Problem: RH Bed to Chair Transfers Goal: LTG Patient will perform bed/chair transfers w/assist (PT) LTG: Patient will perform bed/chair transfers with assistance, with/without cues (PT).  Downgraded secondary to ineffective carryover of w/c parts management, transfer setup.

## 2014-05-31 NOTE — Progress Notes (Signed)
Occupational Therapy Session Note  Patient Details  Name: Kimberly Lane MRN: [id number] Date of Birth: [birth year]  Today's Date: 05/31/2014 OT Individual Time: 0930-1015 OT Individual Time Calculation (min): 45 min    Short Term Goals: Week 1:  OT Short Term Goal 1 (Week 1): STG = LTGs due to ELOS :     Skilled Therapeutic Interventions/Progress Updates:    Engaged in therapeutic dressing at the the sitting and standing level; focus on standing balance, transfers, sitting balance, sit to stand. Interpreter present.   Pt. Propelled wc to bathroom with max assist.  Transferred to toilet with 3n1 over it with max assist.  Performed bathing and dressing while sitting on toilet.  See Fim for levels.  Left pt in wc with safety belt on and call bell,phone within reach.  Husband and interpreter in room as well.     Therapy Documentation Precautions:  Precautions Precautions: Fall Precaution Comments: Painful R shoulder. Restrictions Weight Bearing Restrictions: Yes RLE Weight Bearing: Non weight bearing      Pain: Pain Assessment Pain Assessment: 0-10 Pain Score:4/10Pain Type: Chronic pain Pain Location: Residual limb Pain Orientation: Right Pain Descriptors / Indicators: Aching Pain Intervention(s): Medication (See eMAR)    See FIM for current functional status  Therapy/Group: Individual Therapy  Lisa Roca 05/31/2014, 7:21 PM

## 2014-05-31 NOTE — Plan of Care (Signed)
Problem: RH Wheelchair Mobility Goal: LTG Patient will propel w/c in controlled environment (PT) LTG: Patient will propel wheelchair in controlled environment, # of feet with assist (PT)  Distance downgraded, as chronic right shoulder pain has limited patient progress with wheelchair mobility.

## 2014-05-31 NOTE — Plan of Care (Signed)
Problem: RH Ambulation Goal: LTG Patient will ambulate in home environment (PT) LTG: Patient will ambulate in home environment, # of feet with assistance (PT).  Distance downgraded, as chronic right shoulder pain has limited patient progress with ambulation.

## 2014-05-31 NOTE — Plan of Care (Signed)
Problem: RH Furniture Transfers Goal: LTG Patient will perform furniture transfers w/assist (OT/PT LTG: Patient will perform furniture transfers with assistance (OT/PT).  Downgraded secondary to inconsistent patient carryover of transfer setup and w/c parts management.

## 2014-05-31 NOTE — Progress Notes (Signed)
Physical Therapy Session Note  Patient Details  Name: Kimberly Lane MRN: [id number] Date of Birth: Dec 30, 1960  Today's Date: 05/31/2014 PT Individual Time: 0800-0900 and 1030-1200 PT Individual Time Calculation (min): 60 min and 90 min   Short Term Goals: Week 1:  PT Short Term Goal 1 (Week 1): = LTG secondary to short LOS  Skilled Therapeutic Interventions/Progress Updates:    Treatment Session 1: Pt received semi reclined in bed accompanied by husband; asleep but easily aroused. Performed supine > sit with mod I, HOB flat without rail. Seated EOB, assisted pt with doffing gown, donning pants and shirt; see Balance section below for details regarding assist level with dynamic sitting/standing balance. Pt performed w/c mobility x225' in controlled environment with bilat UE's and supervision, cueing for technique with turning. In ortho gym, explained and demonstrated the following to pt's husband with effective return dmeonstration from husband: providing close supervision squat pivot transfers from bed <> w/c; providing 25-50% cueing for safe/effective transfer setup; and w/c breakdown and parts management. Returned to pt's room, where pt performed performed stand-pivot transfer from w/c > toilet with min A and grab bars. Departed with pt seated on toilet with NT present to assist and pt in no apparent distress.  Treatment Session 2: Pt received seated in w/c; agreeable to therapy.Husband not present to complete hands-on family training at this time. Therefore,  session focused on gait training, functional transfers, and w/c mobility. Transported pt to hallway outside treatment gym, where pt performed functional ambulation x10' then x15' with rolling walker and "hop-to" gait pattern requiring min A, cueing to address pt tendency toward anterior trunk lean, limited LLE clearance. Transitioned to gait training with bilat UE support at parallel bars with 75% cueing for increased utilization of bilat UE's  during LLE advancement as opposed to jumping forward with LLE; gait training at parallel bars limited by pain in R shoulder. Applied moist heat to R shoulder while adding Theraband to bilat w/c hand rims and provided pt with w/c gloves to increase pt independence with self-propulsion of w/c. Pt then performed w/c around cones with 50% cueing for technique for turning within small radius with effective within-session carryover. Pt performed blocked practice of unlevel squat pivot transfers from w/c <> mat table with close supervision, 50% cueing for setup, w/c parts management. Session ended in pt room, where pt was left seated in w/c with quick release belt in place (per pt request) and all needs within reach.   The following goals were downgraded secondary to progress limited by pain in R shoulder pain, pt inconsistency with carryover of w/c parts management and transfer setup: ambulation distance (controlled and home enivironments), basic/furniture transfer assist level, wc mobility assist level (all environments).  Therapy Documentation Precautions:  Precautions Precautions: Fall Precaution Comments: Painful R shoulder. Restrictions Weight Bearing Restrictions: Yes RLE Weight Bearing: Non weight bearing Vital Signs: Therapy Vitals Temp: 98.3 F (36.8 C) Temp Source: Oral Pulse Rate: 71 Resp: 16 BP: (!) 141/65 mmHg Patient Position (if appropriate): Lying Oxygen Therapy SpO2: 100 % O2 Device: Not Delivered Pain: Pain Assessment Pain Assessment: 0-10 Pain Score: 8  (per interpreter) Pain Type: Chronic pain Pain Location: Shoulder Pain Orientation: Right Pain Descriptors / Indicators: Aching Pain Onset: With Activity Pain Intervention(s): Heat applied Locomotion : Ambulation Ambulation/Gait Assistance: 4: Min assist Wheelchair Mobility Distance: 225  Balance: Dynamic Sitting Balance Dynamic Sitting - Balance Support: During functional activity;Feet supported (LLE  supported) Dynamic Sitting - Level of Assistance: 6: Modified independent (Device/Increase  time) Sitting balance - Comments: Seated EOB, donning pants and shirt. No overt LOB. Dynamic Standing Balance Dynamic Standing - Balance Support: During functional activity;No upper extremity supported Dynamic Standing - Level of Assistance: 4: Min assist;3: Mod assist Dynamic Standing - Comments: While pulling up pants in standing  See FIM for current functional status  Therapy/Group: Individual Therapy  Joyce Leckey, Malva Cogan 05/31/2014, 12:46 PM

## 2014-05-31 NOTE — Plan of Care (Signed)
Problem: RH Ambulation Goal: LTG Patient will ambulate in controlled environment (PT) LTG: Patient will ambulate in a controlled environment, # of feet with assistance (PT).  Distance downgraded, as chronic right shoulder pain has limited patient progress with ambulation.

## 2014-05-31 NOTE — Progress Notes (Signed)
Subjective/Complaints: Some Left ankle/foot pain ROS: no phantom pain, only stump pain  Objective: Vital Signs: Blood pressure 141/65, pulse 71, temperature 98.3 F (36.8 C), temperature source Oral, resp. rate 16, weight 59 kg (130 lb 1.1 oz), SpO2 100 %. No results found. Results for orders placed or performed during the hospital encounter of 05/26/14 (from the past 72 hour(s))  Glucose, capillary     Status: Abnormal   Collection Time: 05/28/14 12:14 PM  Result Value Ref Range   Glucose-Capillary 262 (H) 70 - 99 mg/dL  Glucose, capillary     Status: Abnormal   Collection Time: 05/28/14  4:54 PM  Result Value Ref Range   Glucose-Capillary 173 (H) 70 - 99 mg/dL  Glucose, capillary     Status: Abnormal   Collection Time: 05/28/14  9:19 PM  Result Value Ref Range   Glucose-Capillary 150 (H) 70 - 99 mg/dL  Glucose, capillary     Status: Abnormal   Collection Time: 05/29/14  6:46 AM  Result Value Ref Range   Glucose-Capillary 193 (H) 70 - 99 mg/dL  Glucose, capillary     Status: Abnormal   Collection Time: 05/29/14 12:06 PM  Result Value Ref Range   Glucose-Capillary 226 (H) 70 - 99 mg/dL  Glucose, capillary     Status: Abnormal   Collection Time: 05/29/14  5:04 PM  Result Value Ref Range   Glucose-Capillary 130 (H) 70 - 99 mg/dL  Glucose, capillary     Status: Abnormal   Collection Time: 05/29/14  9:25 PM  Result Value Ref Range   Glucose-Capillary 184 (H) 70 - 99 mg/dL  Glucose, capillary     Status: Abnormal   Collection Time: 05/30/14  6:52 AM  Result Value Ref Range   Glucose-Capillary 189 (H) 70 - 99 mg/dL  Glucose, capillary     Status: Abnormal   Collection Time: 05/30/14 12:14 PM  Result Value Ref Range   Glucose-Capillary 262 (H) 70 - 99 mg/dL   Comment 1 Notify RN   Glucose, capillary     Status: Abnormal   Collection Time: 05/30/14  4:23 PM  Result Value Ref Range   Glucose-Capillary 231 (H) 70 - 99 mg/dL   Comment 1 Notify RN   Glucose, capillary      Status: Abnormal   Collection Time: 05/30/14  9:39 PM  Result Value Ref Range   Glucose-Capillary 134 (H) 70 - 99 mg/dL  Glucose, capillary     Status: Abnormal   Collection Time: 05/31/14  6:49 AM  Result Value Ref Range   Glucose-Capillary 188 (H) 70 - 99 mg/dL     HEENT: normal Cardio: RRR Resp: CTA B/L GI: BS positive Extremity:  Edema right BK stump Skin:   Wound C/D/I s/p Right BKA Neuro: Lethargic and Abnormal Motor 4/5 in BUE and LLE, R HF Musc/Skel:  Other short Right BKA, no excessive stump edema, pain with R shoulder abd and horz add, left heel no pain to palpation no swelling severe pes planus, no pain with foot or ankle ROM Gen NAD   Assessment/Plan: 1. Functional deficits secondary to Right BKA which require 3+ hours per day of interdisciplinary therapy in a comprehensive inpatient rehab setting. Physiatrist is providing close team supervision and 24 hour management of active medical problems listed below. Physiatrist and rehab team continue to assess barriers to discharge/monitor patient progress toward functional and medical goals.  FIM: FIM - Bathing Bathing Steps Patient Completed: Chest, Right Arm, Left Arm, Abdomen, Front perineal  area, Buttocks, Right upper leg, Left upper leg Bathing: 4: Min-Patient completes 8-9 69f 10 parts or 75+ percent  FIM - Upper Body Dressing/Undressing Upper body dressing/undressing steps patient completed: Thread/unthread right sleeve of pullover shirt/dresss, Thread/unthread left sleeve of pullover shirt/dress, Put head through opening of pull over shirt/dress, Pull shirt over trunk Upper body dressing/undressing: 5: Set-up assist to: Obtain clothing/put away FIM - Lower Body Dressing/Undressing Lower body dressing/undressing steps patient completed: Thread/unthread right pants leg, Thread/unthread left pants leg, Pull pants up/down, Don/Doff left shoe, Don/Doff left sock Lower body dressing/undressing: 4: Min-Patient completed 75  plus % of tasks  FIM - Toileting Toileting steps completed by patient: Adjust clothing prior to toileting, Performs perineal hygiene, Adjust clothing after toileting Toileting Assistive Devices: Grab bar or rail for support Toileting: 4: Steadying assist  FIM - Radio producer Devices: Elevated toilet seat, Grab bars Toilet Transfers: 4-To toilet/BSC: Min A (steadying Pt. > 75%), 4-From toilet/BSC: Min A (steadying Pt. > 75%)  FIM - Bed/Chair Transfer Bed/Chair Transfer Assistive Devices: Arm rests Bed/Chair Transfer: 5: Bed > Chair or W/C: Supervision (verbal cues/safety issues), 5: Chair or W/C > Bed: Supervision (verbal cues/safety issues), 6: Supine > Sit: No assist, 6: Sit > Supine: No assist (close supervision)  FIM - Locomotion: Wheelchair Distance: 150 Locomotion: Wheelchair: 4: Travels 150 ft or more: maneuvers on rugs and over door sillls with minimal assistance (Pt.>75%) FIM - Locomotion: Ambulation Locomotion: Ambulation Assistive Devices: Administrator Ambulation/Gait Assistance: 4: Min guard Locomotion: Ambulation: 1: Travels less than 50 ft with minimal assistance (Pt.>75%)  Comprehension Comprehension Mode: Auditory Comprehension: 6-Follows complex conversation/direction: With extra time/assistive device  Expression Expression Mode: Verbal Expression:  (UTA Arabic speaking )  Social Interaction Social Interaction Mode: Not assessed Social Interaction: 7-Interacts appropriately with others - No medications needed.  Problem Solving Problem Solving Mode: Not assessed Problem Solving: 5-Solves complex 90% of the time/cues < 10% of the time  Memory Memory Mode:  (UTA pt is arabic speaking ) Memory: 4-Recognizes or recalls 75 - 89% of the time/requires cueing 10 - 24% of the time  Medical Problem List and Plan: 1. Functional deficits secondary to right BKA secondary to PVD 2. DVT Prophylaxis/Anticoagulation: Subcutaneous heparin.  Monitor platelet counts for any signs of bleeding 3. Pain Management: Oxycodone as needed. Monitor with increased mobility, add low dose oxycontin 10mg  BID, shoulder pain AC joint , rotator cuff arthropathy, add voltaren gel 4. Acute blood loss anemia. Follow-up CBC 5. Neuropsych: This patient is capable of making decisions on her own behalf. 6. Skin/Wound Care: Routine skin checks 7. Fluids/Electrolytes/Nutrition: Strict I and O follow-up chemistries 8. Diabetes mellitus and peripheral neuropathy. Hemoglobin A1c 9.6. Lantus insulin 20 units daily. Check blood sugars before meals and at bedtime 9. Hypertension. Lisinopril 20 mg daily, Norvasc 10 mg daily. Monitor with increased mobility 10. Hyperlipidemia. Lipitor2 11. Constipation. Adjust bowel program 12.  Leukocytosis,resolved low grade temp resolved , - CXR and UA, likely atelectasis, enc IS LOS (Days) 5 A FACE TO FACE EVALUATION WAS PERFORMED  KIRSTEINS,ANDREW E 05/31/2014, 9:32 AM

## 2014-06-01 ENCOUNTER — Inpatient Hospital Stay (HOSPITAL_COMMUNITY): Payer: Self-pay | Admitting: Physical Therapy

## 2014-06-01 DIAGNOSIS — Z89511 Acquired absence of right leg below knee: Secondary | ICD-10-CM | POA: Diagnosis not present

## 2014-06-01 LAB — GLUCOSE, CAPILLARY
Glucose-Capillary: 160 mg/dL — ABNORMAL HIGH (ref 70–99)
Glucose-Capillary: 193 mg/dL — ABNORMAL HIGH (ref 70–99)
Glucose-Capillary: 202 mg/dL — ABNORMAL HIGH (ref 70–99)
Glucose-Capillary: 430 mg/dL — ABNORMAL HIGH (ref 70–99)

## 2014-06-01 MED ORDER — INSULIN ASPART 100 UNIT/ML ~~LOC~~ SOLN
10.0000 [IU] | Freq: Once | SUBCUTANEOUS | Status: AC
  Administered 2014-06-01: 10 [IU] via SUBCUTANEOUS

## 2014-06-01 NOTE — Progress Notes (Signed)
Physical Therapy Session Note  Patient Details  Name: Kimberly Lane MRN: [id number] Date of Birth: 1960/03/18  Today's Date: 06/01/2014 PT Individual Time: [id number] PT Individual Time Calculation (min): 60 min   Short Term Goals: Week 1:  PT Short Term Goal 1 (Week 1): = LTG secondary to short LOS  Skilled Therapeutic Interventions/Progress Updates:  Pt was seen bedside in the am. Pt propelled w/c 150 feet with B UEs and S. Performed AROM R knee flex and quad sets, 3 sets x 10 reps each. Pt transferred w/c to edge of mat with S and verbal cues with increased time. Pt transferred edge of mat to w/c with S and verbal cues with increased time. Pt transferred sit to stand with rolling walker and S to min guard. Pt ambulated 30 feet x 2 with rolling walker and S to min guard with verbal cues for safety. Pt propelled w/c back to room with S and B UEs. Pt left sitting up in w.c with quick release belt in place and call bell within reach.   Therapy Documentation Precautions:  Precautions Precautions: Fall Precaution Comments: Painful R shoulder. Restrictions Weight Bearing Restrictions: Yes RLE Weight Bearing: Non weight bearing General:   Pain: Pt c/o mild pain R BKA.    Locomotion : Ambulation Ambulation/Gait Assistance: 5: Supervision;4: Min guard   See FIM for current functional status  Therapy/Group: Individual Therapy  Dub Amis 06/01/2014, 3:37 PM

## 2014-06-01 NOTE — Progress Notes (Signed)
Subjective/Complaints: No Pain c/os ROS: no interpreter  Objective: Vital Signs: Blood pressure 125/91, pulse 67, temperature 98.1 F (36.7 C), temperature source Oral, resp. rate 18, weight 59 kg (130 lb 1.1 oz), SpO2 99 %. No results found. Results for orders placed or performed during the hospital encounter of 05/26/14 (from the past 72 hour(s))  Glucose, capillary     Status: Abnormal   Collection Time: 05/29/14 12:06 PM  Result Value Ref Range   Glucose-Capillary 226 (H) 70 - 99 mg/dL  Glucose, capillary     Status: Abnormal   Collection Time: 05/29/14  5:04 PM  Result Value Ref Range   Glucose-Capillary 130 (H) 70 - 99 mg/dL  Glucose, capillary     Status: Abnormal   Collection Time: 05/29/14  9:25 PM  Result Value Ref Range   Glucose-Capillary 184 (H) 70 - 99 mg/dL  Glucose, capillary     Status: Abnormal   Collection Time: 05/30/14  6:52 AM  Result Value Ref Range   Glucose-Capillary 189 (H) 70 - 99 mg/dL  Glucose, capillary     Status: Abnormal   Collection Time: 05/30/14 12:14 PM  Result Value Ref Range   Glucose-Capillary 262 (H) 70 - 99 mg/dL   Comment 1 Notify RN   Glucose, capillary     Status: Abnormal   Collection Time: 05/30/14  4:23 PM  Result Value Ref Range   Glucose-Capillary 231 (H) 70 - 99 mg/dL   Comment 1 Notify RN   Glucose, capillary     Status: Abnormal   Collection Time: 05/30/14  9:39 PM  Result Value Ref Range   Glucose-Capillary 134 (H) 70 - 99 mg/dL  Glucose, capillary     Status: Abnormal   Collection Time: 05/31/14  6:49 AM  Result Value Ref Range   Glucose-Capillary 188 (H) 70 - 99 mg/dL  Glucose, capillary     Status: Abnormal   Collection Time: 05/31/14 11:38 AM  Result Value Ref Range   Glucose-Capillary 359 (H) 70 - 99 mg/dL   Comment 1 Notify RN   Glucose, capillary     Status: Abnormal   Collection Time: 05/31/14  4:39 PM  Result Value Ref Range   Glucose-Capillary 135 (H) 70 - 99 mg/dL  Glucose, capillary     Status:  Abnormal   Collection Time: 05/31/14 10:02 PM  Result Value Ref Range   Glucose-Capillary 154 (H) 70 - 99 mg/dL  Glucose, capillary     Status: Abnormal   Collection Time: 06/01/14  6:55 AM  Result Value Ref Range   Glucose-Capillary 160 (H) 70 - 99 mg/dL     Vital signs reviewed Gen NAD   Assessment/Plan: 1. Functional deficits secondary to Right BKA which require 3+ hours per day of interdisciplinary therapy in a comprehensive inpatient rehab setting. Physiatrist is providing close team supervision and 24 hour management of active medical problems listed below. Physiatrist and rehab team continue to assess barriers to discharge/monitor patient progress toward functional and medical goals.  FIM: FIM - Bathing Bathing Steps Patient Completed: Chest, Right Arm, Left Arm, Abdomen, Front perineal area, Buttocks, Right upper leg, Left upper leg, Left lower leg (including foot) Bathing: 4: Steadying assist  FIM - Upper Body Dressing/Undressing Upper body dressing/undressing steps patient completed: Thread/unthread right sleeve of pullover shirt/dresss, Thread/unthread left sleeve of pullover shirt/dress, Put head through opening of pull over shirt/dress, Pull shirt over trunk Upper body dressing/undressing: 5: Set-up assist to: Obtain clothing/put away FIM - Lower Body Dressing/Undressing  Lower body dressing/undressing steps patient completed: Thread/unthread right pants leg, Thread/unthread left pants leg, Pull pants up/down, Don/Doff left shoe, Don/Doff left sock Lower body dressing/undressing: 4: Min-Patient completed 75 plus % of tasks  FIM - Toileting Toileting steps completed by patient: Adjust clothing prior to toileting, Performs perineal hygiene, Adjust clothing after toileting Toileting Assistive Devices: Grab bar or rail for support Toileting: 4: Steadying assist  FIM - Radio producer Devices: Product manager Transfers: 3-To toilet/BSC: Mod A  (lift or lower assist)  FIM - Control and instrumentation engineer Devices: Arm rests Bed/Chair Transfer: 4: Bed > Chair or W/C: Min A (steadying Pt. > 75%), 4: Chair or W/C > Bed: Min A (steadying Pt. > 75%)  FIM - Locomotion: Wheelchair Distance: 225 Locomotion: Wheelchair: 5: Travels 150 ft or more: maneuvers on rugs and over door sills with supervision, cueing or coaxing FIM - Locomotion: Ambulation Locomotion: Ambulation Assistive Devices: Administrator Ambulation/Gait Assistance: 4: Min assist Locomotion: Ambulation: 1: Travels less than 50 ft with minimal assistance (Pt.>75%)  Comprehension Comprehension Mode: Auditory Comprehension:  (UTA )  Expression Expression Mode: Verbal Expression: 5-Expresses basic 90% of the time/requires cueing < 10% of the time.  Social Interaction Social Interaction Mode: Not assessed Social Interaction: 6-Interacts appropriately with others with medication or extra time (anti-anxiety, antidepressant).  Problem Solving Problem Solving Mode: Not assessed Problem Solving: 5-Solves complex 90% of the time/cues < 10% of the time  Memory Memory Mode:  (UTA pt speaks Arabic ) Memory: 4-Recognizes or recalls 75 - 89% of the time/requires cueing 10 - 24% of the time  Medical Problem List and Plan: 1. Functional deficits secondary to right BKA secondary to PVD 2. DVT Prophylaxis/Anticoagulation: Subcutaneous heparin. Monitor platelet counts for any signs of bleeding  LOS (Days) 6 A FACE TO FACE EVALUATION WAS PERFORMED  Kimberly Lane 06/01/2014, 9:42 AM

## 2014-06-02 ENCOUNTER — Inpatient Hospital Stay (HOSPITAL_COMMUNITY): Payer: Self-pay | Admitting: Physical Therapy

## 2014-06-02 ENCOUNTER — Inpatient Hospital Stay (HOSPITAL_COMMUNITY): Payer: Self-pay | Admitting: Occupational Therapy

## 2014-06-02 ENCOUNTER — Inpatient Hospital Stay (HOSPITAL_COMMUNITY): Payer: No Typology Code available for payment source

## 2014-06-02 ENCOUNTER — Inpatient Hospital Stay (HOSPITAL_COMMUNITY): Payer: Self-pay

## 2014-06-02 DIAGNOSIS — Z89511 Acquired absence of right leg below knee: Secondary | ICD-10-CM | POA: Diagnosis not present

## 2014-06-02 LAB — GLUCOSE, CAPILLARY
GLUCOSE-CAPILLARY: 179 mg/dL — AB (ref 70–99)
Glucose-Capillary: 154 mg/dL — ABNORMAL HIGH (ref 70–99)
Glucose-Capillary: 223 mg/dL — ABNORMAL HIGH (ref 70–99)
Glucose-Capillary: 232 mg/dL — ABNORMAL HIGH (ref 70–99)
Glucose-Capillary: 148 mg/dL — ABNORMAL HIGH (ref 70–99)

## 2014-06-02 NOTE — Discharge Summary (Signed)
Discharge summary job [phone]

## 2014-06-02 NOTE — Progress Notes (Signed)
Patient noted with a blood sugar of 430 @ HS, MD notified, new order given to give 10 units of Novolog x 1 and to check BS in two hours. Patient was given 10 units and asked what she ate for dinner, she showed me a bowl of couscous, beans and gravy.  BS was rechecked at [facility], result was 179 and patient remains asymptomatic. Will continue to monitor during shift.
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
## 2014-06-02 NOTE — Progress Notes (Signed)
Physical Therapy Discharge Summary  Patient Details  Name: Kimberly Lane MRN: [id number] Date of Birth: December 25, 1960  Today's Date: 06/02/2014 PT Individual Time: 1000-1100 and 1330-1400 PT Individual Time Calculation (min): 60 min and 30 min   Patient has met 7 of 9 long term goals due to improved activity tolerance, improved balance, increased strength, increased range of motion and decreased pain.  Patient to discharge at a wheelchair level Supervision.   Patient's care partner is independent to provide the necessary physical and cognitive assistance at discharge.  Reasons goals not met: Goal for dynamic standing balance not met due to pt requiring min A; goal for w/c mobility in community environment not met secondary to pt requiring hands-on assist for standard ramp negotiation.  Recommendation:  Patient will benefit from ongoing skilled PT services in outpatient setting to continue to advance safe functional mobility, address ongoing impairments in stability/independence with functional mobility and minimize fall risk.  Equipment: w/c (18" x16") and cushion; youth rolling walker  Reasons for discharge: treatment goals met and discharge from hospital  Patient/family agrees with progress made and goals achieved: Yes   Skilled Therapeutic Interventions/Progress Updates Treatment Session 1: Pt received seated in w/c accompanied by husband and interpreter. Session focused on completion of hands-on family training with husband. Explained and demonstrated to husband how to provide total A to bump w/c up/down single curb step. Husband performed x2 consecutive trials, requiring cueing for initial trial then gave effective return demonstration during subsequent trial without cueing form this PT. With cueing from this PT, husband provided min A with sit <> stand from w/c with rolling walker and close supervision with functional ambulation x30' in controlled and home environments with rolling walker.  Husband also gave effective return demonstration of proper guarding, appropriate cueing with squat pivot transfer from w/c <> mat table. Pt/husband report feeling safe/comfortable with all aspects of functional mobility. Session ended in pt room, where pt was left seated in w/c with interpreter and husband present and all needs within reach.  Treatment Session 2: Pt received semi reclined in bed; agreeable to therapy. Session focused on assessing/addressing stability and independence with functional mobility. See discharge evaluation below for detailed findings. Departed with pt semi reclined in bed with 2 rails up, bed alarm on, husband present, and all needs within reach.  PT Discharge Precautions/Restrictions Precautions Precautions: Fall Precaution Comments: Painful R shoulder. Restrictions Weight Bearing Restrictions: Yes RLE Weight Bearing: Non weight bearing Vital Signs Therapy Vitals Temp: 99 F (37.2 C) Temp Source: Oral Pulse Rate: 75 Resp: 17 BP: 128/60 mmHg Patient Position (if appropriate): Sitting Oxygen Therapy O2 Device: Not Delivered Pain Pain Assessment Pain Assessment: 0-10 Pain score: 0 Vision/Perception    No change from baseline Cognition Overall Cognitive Status: Within Functional Limits for tasks assessed Arousal/Alertness: Awake/alert Orientation Level: Oriented X4 Attention: Selective Awareness: Appears intact Safety/Judgment: Appears intact Sensation Sensation Light Touch: Impaired by gross assessment Stereognosis: Not tested Hot/Cold: Not tested Proprioception: Appears Intact Additional Comments: grossly decreased to light touch distal end of residual limb Coordination Fine Motor Movements are Fluid and Coordinated: Yes Motor  Motor Motor: Within Functional Limits  Mobility Bed Mobility Bed Mobility: Supine to Sit;Sit to Supine Supine to Sit: HOB flat;6: Modified independent (Device/Increase time) Sit to Supine: 6: Modified independent  (Device/Increase time);HOB flat Transfers Transfers: Yes Sit to Stand: From chair/3-in-1;4: Min assist Stand to Sit: 4: Min Teacher, English as a foreign language Transfers: 5: Supervision Locomotion  Ambulation Ambulation: Yes Ambulation/Gait Assistance: 4: Min Wellsite geologist (  Feet): 30 Feet Assistive device: Rolling walker Gait Gait: Yes Gait Pattern: Impaired Gait Pattern:  ("hop-to" pattern) Stairs / Additional Locomotion Stairs: No Wheelchair Mobility Wheelchair Mobility: Yes Wheelchair Assistance: 5: Supervision; 4: Min A (Min A for negotiation of standard ramp) Wheelchair Assistance Details: Verbal cues for Marketing executive: Both upper extremities Wheelchair Parts Management: Supervision/cueing Distance: 200  Trunk/Postural Assessment  Cervical Assessment Cervical Assessment: Within Functional Limits Thoracic Assessment Thoracic Assessment: Within Functional Limits Lumbar Assessment Lumbar Assessment: Within Functional Limits Postural Control Postural Control: Within Functional Limits  Balance Balance Balance Assessed: Yes Static Sitting Balance Static Sitting - Level of Assistance: 6: Modified independent (Device/Increase time) Dynamic Sitting Balance Dynamic Sitting - Balance Support: During functional activity;Feet supported Dynamic Sitting - Level of Assistance: 6: Modified independent (Device/Increase time) Sitting balance - Comments: Seated EOB, donning pants and shirt. No overt LOB. Dynamic Standing Balance Dynamic Standing - Balance Support: During functional activity;right upper extremity supported Dynamic Standing - Level of Assistance: 4: Min assist; Extremity Assessment  RUE Assessment RUE Assessment: Exceptions to [facility] (pain in Rt shoulder, unable to raise greater than 90 degrees without assist from LUE, unable to hold against gravity) LUE Assessment LUE Assessment: Within Functional Limits RLE Assessment RLE Assessment: Exceptions to  [facility] RLE AROM (degrees) Right Knee Extension: -6 Right Knee Flexion: 93 RLE Strength RLE Overall Strength: Deficits;Due to pain Right Hip Flexion: 4-/5 Right Knee Flexion: 3-/5 Right Knee Extension: 3-/5 LLE Assessment LLE Assessment: Within Functional Limits  See FIM for current functional status  Hobble, Malva Cogan 06/02/2014, 4:07 PM

## 2014-06-02 NOTE — Telephone Encounter (Signed)
Entered in error

## 2014-06-02 NOTE — Plan of Care (Signed)
Problem: RH Balance Goal: LTG Patient will maintain dynamic standing balance (PT) LTG: Patient will maintain dynamic standing balance with assistance during mobility activities (PT)  Outcome: Not Met (add Reason) Not met, as patient requires Min A for dynamic standing balance.     

## 2014-06-02 NOTE — Progress Notes (Signed)
Occupational Therapy Discharge Summary  Patient Details  Name: Kimberly Lane MRN: [id number] Date of Birth: 1960/12/11  Patient has met 6 of 8 long term goals due to improved activity tolerance, improved balance, postural control and ability to compensate for deficits.  Patient to discharge at overall Supervision level at w/c, min assist with standing.  Patient's care partner is independent to provide the necessary physical assistance at discharge.  Recommend that patient's husband provide hands on/min assist with standing tasks secondary to decreased standing balance.      Reasons goals not met: Pt currently requires min assist with LB dressing and dynamic standing balance secondary to decreased standing balance.  Pt's husband has been educated on providing min/steady assist during standing activities and demonstrates ability to assist.  Recommendation:  Patient will benefit from ongoing skilled OT services in home health setting to continue to advance functional skills in the area of BADL and Reduce care partner burden.  Equipment: drop arm BSC  Reasons for discharge: treatment goals met and discharge from hospital  Patient/family agrees with progress made and goals achieved: Yes  OT Discharge Precautions/Restrictions  Precautions Precautions: Fall Precaution Comments: Painful R shoulder. Restrictions Weight Bearing Restrictions: Yes RLE Weight Bearing: Non weight bearing General   Vital Signs Therapy Vitals BP: 133/72 mmHg Pain Pain Assessment Pain Assessment: 0-10 Pain Score: 5  Pain Type: Acute pain Pain Location: Arm Pain Orientation: Right Pain Descriptors / Indicators: Aching Pain Frequency: Constant Pain Onset: On-going Patients Stated Pain Goal: 0 Pain Intervention(s): Medication (See eMAR) ADL  See FIM Vision/Perception  Vision- History Baseline Vision/History: No visual deficits Patient Visual Report: No change from baseline Vision- Assessment Vision  Assessment?: No apparent visual deficits  Cognition Overall Cognitive Status: Within Functional Limits for tasks assessed Arousal/Alertness: Awake/alert Orientation Level: Oriented X4 Attention: Selective Awareness: Appears intact Safety/Judgment: Appears intact Sensation Sensation Light Touch: Impaired by gross assessment Stereognosis: Not tested Hot/Cold: Not tested Proprioception: Appears Intact Additional Comments: grossly decreased to light touch distal end of residual limb Coordination Fine Motor Movements are Fluid and Coordinated: Yes Extremity/Trunk Assessment RUE Assessment RUE Assessment: Exceptions to [facility] (pain in Rt shoulder, unable to raise greater than 90 degrees without assist from LUE, unable to hold against gravity) LUE Assessment LUE Assessment: Within Functional Limits  See FIM for current functional status  Embrie Mikkelsen, [facility] 06/02/2014, 11:28 AM

## 2014-06-02 NOTE — Progress Notes (Signed)
Occupational Therapy Session Note  Patient Details  Name: Kimberly Lane MRN: [id number] Date of Birth: 1960-06-09  Today's Date: 06/02/2014 OT Individual Time: [id number] OT Individual Time Calculation (min): 65 min    Short Term Goals: Week 1:  OT Short Term Goal 1 (Week 1): STG = LTGs due to ELOS  Skilled Therapeutic Interventions/Progress Updates:    Completed ADL retraining in room bathroom on toilet at sit > stand level.  Pt completed w/c <> toilet transfers with close supervision and cues for setup of w/c.  Bathing completed seated on toilet with setup assist of supplies, min/steady assist when standing to pull pants over hips.  Pt with 1 LOB to Rt when shifting weight and switching hands to pull up pants, requiring physical assist to correct.  Educated husband on close supervision with all transfers and recommend hands on at this time with standing due to inconsistent standing balance.  Educated on w/c positioning with squat pivot transfer w/c <> drop arm BSC and parts management.  Pt required hand over hand assist to manage drop arm on BSC, pt's husband demonstrated ability to assist as needed with parts management.  Pt and husband return demonstrated squat pivot transfers w/c <> drop arm BSC.  Educated on recommendation of communication prior to and during transfers to increase safety with mobility.  Pt propelled w/c back to room with min verbal cues for sequencing with turning.  Therapy Documentation Precautions:  Precautions Precautions: Fall Precaution Comments: Painful R shoulder. Restrictions Weight Bearing Restrictions: Yes RLE Weight Bearing: Non weight bearing General:   Vital Signs: Therapy Vitals BP: 133/72 mmHg Pain: Pain Assessment Pain Assessment: 0-10 Pain Score: 5  Pain Type: Acute pain Pain Location: Arm Pain Orientation: Right Pain Descriptors / Indicators: Aching Pain Frequency: Constant Pain Onset: On-going Patients Stated Pain Goal: 0 Pain  Intervention(s): Medication (See eMAR)  See FIM for current functional status  Therapy/Group: Individual Therapy  Simonne Come 06/02/2014, 10:55 AM

## 2014-06-02 NOTE — Plan of Care (Signed)
Problem: RH Simple Meal Prep Goal: LTG Patient will perform simple meal prep w/assist (OT) LTG: Patient will perform simple meal prep with assistance, with/without cues (OT).  Outcome: Not Applicable Date Met:  61/95/09 Goal not addressed at this time, as focus on transfers and family education has been primary focus.

## 2014-06-02 NOTE — Discharge Summary (Signed)
NAMESULEY, KINCADE NO.:  [id number]  MEDICAL RECORD NO.:  [id number]  LOCATION:  4W20C                        FACILITY:  [facility]  PHYSICIAN:  Charlett Blake, M.D.DATE OF BIRTH:  09-09-1960  DATE OF ADMISSION:  05/26/2014 DATE OF DISCHARGE:  06/03/2014                              DISCHARGE SUMMARY   DISCHARGE DIAGNOSES: 1. Functional deficits secondary to right below knee amputation     secondary to peripheral vascular disease. 2. Hypertension. 3. Hyperlipidemia. 4. Diabetes mellitus with peripheral neuropathy. 5. Constipation.  HISTORY OF PRESENT ILLNESS:  This is a 54 year old right-handed female, limited English speaking refugee with history of hypertension, diabetes mellitus, who presented on May 20, 2014, with right fifth toe wound that occurred approximately 2 weeks ago after she hit her toe against a chair at home.  She lives with her husband and had been using a wheelchair prior to admission.  Wound continued to progress with cellulitic changes.  X-rays with no evidence of osteomyelitis.  Noted soft tissue swelling.  MRI of the foot showed soft tissue laceration involving the right toe, progressive ischemic changes.  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  Limb was not felt to be salvageable and underwent right below-knee amputation on May 23, 2014, per Dr. Kellie Simmering.  HOSPITAL COURSE PAIN MANAGEMENT:  Acute blood loss anemia at 8.7 and monitored.  Follow up prosthetic care per [employer] prosthetics.  Bouts of urinary retention, continued to improve.  Subcutaneous heparin for DVT prophylaxis.  Physical and occupational therapy ongoing.  The patient was admitted for comprehensive rehab program.  PAST MEDICAL HISTORY:  See discharge diagnoses.  SOCIAL HISTORY:  Lives with spouse, used a wheelchair prior to admission.  Functional status upon admission to rehab services was +2 physical assist, sit to stand; +2  physical assist, stand pivot transfers; min to mod assist for activities of daily living.  PHYSICAL EXAMINATION:  VITAL SIGNS:  Blood pressure 144/69, pulse 83, temperature 99, respirations 18. GENERAL:  This was an alert female, limited English-speaking, followed demonstrating commands with a translator on hand. LUNGS:  Clear to auscultation. CARDIAC:  Regular rate and rhythm. ABDOMEN:  Soft, nontender.  Good bowel sounds.  Right below-knee amputation site was dressed, appropriately tender.  REHABILITATION HOSPITAL COURSE:  Patient was admitted to inpatient rehab services with therapies initiated on a 3-hour daily basis consisting of physical therapy, occupational therapy, and rehabilitation nursing.  The following issues were addressed during the patient's rehabilitation stay.  Pertaining to Ms. Losada's right below-knee amputation secondary to peripheral vascular disease.  Surgical site intact, healing nicely.  She would follow up with Vascular Surgery.  Subcutaneous heparin for DVT prophylaxis.  No bleeding episodes.  Pain management with the use of OxyContin sustained release 10 mg every 12 hours and oxycodone for breakthrough pain.  She did have a history of diabetes mellitus, peripheral neuropathy.  Hemoglobin A1c 9.6.  She continued on insulin therapy while in the hospital and titrated as needed with diabetic teaching. Patient had been on oral agents prior to admission and felt best plan at this time to continue insulin therapy alone and follow up with PCP.  Blood pressures controlled, monitored with  Norvasc and lisinopril.  She would follow up with her primary care provider.  Bouts of constipation resolved with laxative assistance.  The patient received weekly collaborative interdisciplinary team conferences to discuss estimated length of stay, family teaching, and any barriers to her discharge.  She can propel her wheelchair, supervision.  Transfer wheelchair to edge of mat,  supervision verbal cues.  Transfer edge of mat to wheelchair with supervision and increased time.  Ambulated 30 feet x2, rolling walker, supervision minimal guard.  She could dress her upper body sitting at edge of bed.  Full family teaching was completed. Plan was discharged to home.  DISCHARGE MEDICATIONS: 1. Norvasc 10 mg p.o. daily. 2. Aspirin 81 mg p.o. daily. 3. Lipitor 40 mg p.o. daily. 4. Colace 100 mg p.o. b.i.d. 5. Lantus insulin 25 units subcutaneous daily. 6. Lisinopril 20 mg p.o. daily. 7. Oxycodone immediate release 5 mg every 4 hours as needed, moderate     pain, dispense of #90 tablets. 8. OxyContin sustained release 10 mg p.o. every 12 hours, taper as     directed. 9. MiraLAX 17 g p.o. daily, hold for loose stools.  DIET:  Diabetic diet.  FOLLOWUP:  The patient would follow up with Dr. Alysia Penna at the outpatient rehab center as directed; Dr. Tinnie Gens, Vascular Surgery, 2 weeks call for appointment; Dr. Lincoln Brigham, Medical Management.     Lauraine Rinne, P.A.   ______________________________ Charlett Blake, M.D.    DA/MEDQ  D:  06/02/2014  T:  06/02/2014  Job:  [id number]  cc:   Nelda Severe. Kellie Simmering, M.D. Marina Goodell, MD

## 2014-06-02 NOTE — Progress Notes (Signed)
Subjective/Complaints: CBG elevated last noc.  Had a large amount of starch dish brought in from home at 730pm.  Spoke with pt and husband via interpreter.  Sore spot on R stump ROS: no issues other than above  Objective: Vital Signs: Blood pressure 125/49, pulse 65, temperature 98.4 F (36.9 C), temperature source Oral, resp. rate 18, weight 59 kg (130 lb 1.1 oz), SpO2 98 %. No results found. Results for orders placed or performed during the hospital encounter of 05/26/14 (from the past 72 hour(s))  Glucose, capillary     Status: Abnormal   Collection Time: 05/30/14 12:14 PM  Result Value Ref Range   Glucose-Capillary 262 (H) 70 - 99 mg/dL   Comment 1 Notify RN   Glucose, capillary     Status: Abnormal   Collection Time: 05/30/14  4:23 PM  Result Value Ref Range   Glucose-Capillary 231 (H) 70 - 99 mg/dL   Comment 1 Notify RN   Glucose, capillary     Status: Abnormal   Collection Time: 05/30/14  9:39 PM  Result Value Ref Range   Glucose-Capillary 134 (H) 70 - 99 mg/dL  Glucose, capillary     Status: Abnormal   Collection Time: 05/31/14  6:49 AM  Result Value Ref Range   Glucose-Capillary 188 (H) 70 - 99 mg/dL  Glucose, capillary     Status: Abnormal   Collection Time: 05/31/14 11:38 AM  Result Value Ref Range   Glucose-Capillary 359 (H) 70 - 99 mg/dL   Comment 1 Notify RN   Glucose, capillary     Status: Abnormal   Collection Time: 05/31/14  4:39 PM  Result Value Ref Range   Glucose-Capillary 135 (H) 70 - 99 mg/dL  Glucose, capillary     Status: Abnormal   Collection Time: 05/31/14 10:02 PM  Result Value Ref Range   Glucose-Capillary 154 (H) 70 - 99 mg/dL  Glucose, capillary     Status: Abnormal   Collection Time: 06/01/14  6:55 AM  Result Value Ref Range   Glucose-Capillary 160 (H) 70 - 99 mg/dL  Glucose, capillary     Status: Abnormal   Collection Time: 06/01/14 11:21 AM  Result Value Ref Range   Glucose-Capillary 193 (H) 70 - 99 mg/dL   Comment 1 Notify RN    Glucose, capillary     Status: Abnormal   Collection Time: 06/01/14  4:39 PM  Result Value Ref Range   Glucose-Capillary 202 (H) 70 - 99 mg/dL   Comment 1 Notify RN   Glucose, capillary     Status: Abnormal   Collection Time: 06/01/14  8:55 PM  Result Value Ref Range   Glucose-Capillary 430 (H) 70 - 99 mg/dL   Comment 1 Notify RN   Glucose, capillary     Status: Abnormal   Collection Time: 06/02/14 12:23 AM  Result Value Ref Range   Glucose-Capillary 179 (H) 70 - 99 mg/dL  Glucose, capillary     Status: Abnormal   Collection Time: 06/02/14  6:33 AM  Result Value Ref Range   Glucose-Capillary 154 (H) 70 - 99 mg/dL     Vital signs reviewed Gen NAD   Assessment/Plan: 1. Functional deficits secondary to Right BKA which require 3+ hours per day of interdisciplinary therapy in a comprehensive inpatient rehab setting. Physiatrist is providing close team supervision and 24 hour management of active medical problems listed below. Physiatrist and rehab team continue to assess barriers to discharge/monitor patient progress toward functional and medical goals. Plan D/C  home in am FIM: FIM - Bathing Bathing Steps Patient Completed: Chest, Right Arm, Left Arm, Abdomen, Front perineal area, Buttocks, Right upper leg, Left upper leg, Left lower leg (including foot) Bathing: 4: Steadying assist  FIM - Upper Body Dressing/Undressing Upper body dressing/undressing steps patient completed: Thread/unthread right sleeve of pullover shirt/dresss, Thread/unthread left sleeve of pullover shirt/dress, Put head through opening of pull over shirt/dress, Pull shirt over trunk Upper body dressing/undressing: 5: Set-up assist to: Obtain clothing/put away FIM - Lower Body Dressing/Undressing Lower body dressing/undressing steps patient completed: Thread/unthread right pants leg, Thread/unthread left pants leg, Pull pants up/down, Don/Doff left shoe, Don/Doff left sock Lower body dressing/undressing: 4:  Min-Patient completed 75 plus % of tasks  FIM - Toileting Toileting steps completed by patient: Adjust clothing prior to toileting, Performs perineal hygiene, Adjust clothing after toileting Toileting Assistive Devices: Grab bar or rail for support Toileting: 4: Steadying assist  FIM - Radio producer Devices: Grab bars Toilet Transfers: 4-To toilet/BSC: Min A (steadying Pt. > 75%), 4-From toilet/BSC: Min A (steadying Pt. > 75%)  FIM - Bed/Chair Transfer Bed/Chair Transfer Assistive Devices: Arm rests Bed/Chair Transfer: 5: Chair or W/C > Bed: Supervision (verbal cues/safety issues), 5: Bed > Chair or W/C: Supervision (verbal cues/safety issues)  FIM - Locomotion: Wheelchair Distance: 225 Locomotion: Wheelchair: 5: Travels 150 ft or more: maneuvers on rugs and over door sills with supervision, cueing or coaxing FIM - Locomotion: Ambulation Locomotion: Ambulation Assistive Devices: Administrator Ambulation/Gait Assistance: 5: Supervision, 4: Min guard Locomotion: Ambulation: 1: Travels less than 50 ft with minimal assistance (Pt.>75%)  Comprehension Comprehension Mode: Auditory Comprehension:  (unable to assess)  Expression Expression Mode: Verbal Expression: 5-Expresses basic needs/ideas: With extra time/assistive device  Social Interaction Social Interaction Mode: Not assessed Social Interaction: 6-Interacts appropriately with others with medication or extra time (anti-anxiety, antidepressant).  Problem Solving Problem Solving Mode: Not assessed Problem Solving:  (unable to assess)  Memory Memory Mode:  (UTA pt speaks Arabic ) Memory:  (unable to assess)  Medical Problem List and Plan: 1. Functional deficits secondary to right BKA secondary to PVD 2. DVT Prophylaxis/Anticoagulation: Subcutaneous heparin. Monitor platelet counts for any signs of bleeding 3.  DM-generally has been under control but elevated due to evening meal last noc LOS  (Days) 7 A FACE TO FACE EVALUATION WAS PERFORMED  Aedyn Kempfer E 06/02/2014, 8:27 AM

## 2014-06-02 NOTE — Plan of Care (Signed)
Problem: RH Wheelchair Mobility Goal: LTG Patient will propel w/c in community environment (PT) LTG: Patient will propel wheelchair in community environment, # of feet with assist (PT)  Outcome: Not Met (add Reason) Not met secondary to pt requiring Min A for negotiation of w/c ramp in community environment.

## 2014-06-03 DIAGNOSIS — G629 Polyneuropathy, unspecified: Secondary | ICD-10-CM | POA: Diagnosis not present

## 2014-06-03 DIAGNOSIS — E1142 Type 2 diabetes mellitus with diabetic polyneuropathy: Secondary | ICD-10-CM | POA: Diagnosis not present

## 2014-06-03 DIAGNOSIS — Z89511 Acquired absence of right leg below knee: Secondary | ICD-10-CM | POA: Diagnosis not present

## 2014-06-03 LAB — GLUCOSE, CAPILLARY
GLUCOSE-CAPILLARY: 153 mg/dL — AB (ref 70–99)
Glucose-Capillary: 212 mg/dL — ABNORMAL HIGH (ref 70–99)

## 2014-06-03 MED ORDER — INSULIN GLARGINE 100 UNIT/ML ~~LOC~~ SOLN: 25.0000 [IU] | Freq: Every day | SUBCUTANEOUS | Status: DC

## 2014-06-03 MED ORDER — LISINOPRIL 20 MG PO TABS: 20.0000 mg | ORAL_TABLET | Freq: Every day | ORAL | Status: DC

## 2014-06-03 MED ORDER — INSULIN GLARGINE 100 UNIT/ML ~~LOC~~ SOLN
25.0000 [IU] | Freq: Every day | SUBCUTANEOUS | Status: DC
  Filled 2014-06-03: qty 0.25

## 2014-06-03 MED ORDER — INSULIN GLARGINE 100 UNIT/ML ~~LOC~~ SOLN: 20.0000 [IU] | Freq: Every day | SUBCUTANEOUS | Status: DC

## 2014-06-03 MED ORDER — AMLODIPINE BESYLATE 10 MG PO TABS: 10.0000 mg | ORAL_TABLET | Freq: Every day | ORAL | Status: DC

## 2014-06-03 MED ORDER — OXYCODONE HCL 5 MG PO TABS: 5.0000 mg | ORAL_TABLET | ORAL | Status: DC | PRN

## 2014-06-03 MED ORDER — ATORVASTATIN CALCIUM 40 MG PO TABS: 40.0000 mg | ORAL_TABLET | Freq: Every day | ORAL | Status: DC

## 2014-06-03 MED ORDER — BACID PO TABS: 2.0000 | ORAL_TABLET | Freq: Three times a day (TID) | ORAL | Status: DC

## 2014-06-03 MED ORDER — INSULIN GLARGINE 100 UNIT/ML ~~LOC~~ SOLN
25.0000 [IU] | Freq: Once | SUBCUTANEOUS | Status: AC
  Administered 2014-06-03: 25 [IU] via SUBCUTANEOUS
  Filled 2014-06-03 (×2): qty 0.25

## 2014-06-03 MED ORDER — OXYCODONE HCL ER 10 MG PO T12A: 10.0000 mg | EXTENDED_RELEASE_TABLET | Freq: Two times a day (BID) | ORAL | Status: DC

## 2014-06-03 NOTE — Progress Notes (Addendum)
Subjective/Complaints: Intake improving, no pain c/os ROS: no issues other than above  Objective: Vital Signs: Blood pressure 112/58, pulse 67, temperature 98.3 F (36.8 C), temperature source Oral, resp. rate 17, weight 59 kg (130 lb 1.1 oz), SpO2 99 %. No results found. Results for orders placed or performed during the hospital encounter of 05/26/14 (from the past 72 hour(s))  Glucose, capillary     Status: Abnormal   Collection Time: 05/31/14 11:38 AM  Result Value Ref Range   Glucose-Capillary 359 (H) 70 - 99 mg/dL   Comment 1 Notify RN   Glucose, capillary     Status: Abnormal   Collection Time: 05/31/14  4:39 PM  Result Value Ref Range   Glucose-Capillary 135 (H) 70 - 99 mg/dL  Glucose, capillary     Status: Abnormal   Collection Time: 05/31/14 10:02 PM  Result Value Ref Range   Glucose-Capillary 154 (H) 70 - 99 mg/dL  Glucose, capillary     Status: Abnormal   Collection Time: 06/01/14  6:55 AM  Result Value Ref Range   Glucose-Capillary 160 (H) 70 - 99 mg/dL  Glucose, capillary     Status: Abnormal   Collection Time: 06/01/14 11:21 AM  Result Value Ref Range   Glucose-Capillary 193 (H) 70 - 99 mg/dL   Comment 1 Notify RN   Glucose, capillary     Status: Abnormal   Collection Time: 06/01/14  4:39 PM  Result Value Ref Range   Glucose-Capillary 202 (H) 70 - 99 mg/dL   Comment 1 Notify RN   Glucose, capillary     Status: Abnormal   Collection Time: 06/01/14  8:55 PM  Result Value Ref Range   Glucose-Capillary 430 (H) 70 - 99 mg/dL   Comment 1 Notify RN   Glucose, capillary     Status: Abnormal   Collection Time: 06/02/14 12:23 AM  Result Value Ref Range   Glucose-Capillary 179 (H) 70 - 99 mg/dL  Glucose, capillary     Status: Abnormal   Collection Time: 06/02/14  6:33 AM  Result Value Ref Range   Glucose-Capillary 154 (H) 70 - 99 mg/dL  Glucose, capillary     Status: Abnormal   Collection Time: 06/02/14 12:06 PM  Result Value Ref Range   Glucose-Capillary 223  (H) 70 - 99 mg/dL   Comment 1 Notify RN   Glucose, capillary     Status: Abnormal   Collection Time: 06/02/14  4:18 PM  Result Value Ref Range   Glucose-Capillary 232 (H) 70 - 99 mg/dL  Glucose, capillary     Status: Abnormal   Collection Time: 06/02/14  9:18 PM  Result Value Ref Range   Glucose-Capillary 148 (H) 70 - 99 mg/dL  Glucose, capillary     Status: Abnormal   Collection Time: 06/03/14  6:32 AM  Result Value Ref Range   Glucose-Capillary 212 (H) 70 - 99 mg/dL     Vital signs reviewed Gen NAD   Assessment/Plan: 1. Functional deficits secondary to Right BKA  Stable for D/C today F/u PCP in 1-2 weeks F/u PM&R 3 weeks See D/C summary See D/C instructionsFIM: FIM - Bathing Bathing Steps Patient Completed: Chest, Right Arm, Left Arm, Abdomen, Front perineal area, Buttocks, Right upper leg, Left upper leg, Left lower leg (including foot) Bathing: 5: Set-up assist to: Obtain items  FIM - Upper Body Dressing/Undressing Upper body dressing/undressing steps patient completed: Thread/unthread right sleeve of pullover shirt/dresss, Thread/unthread left sleeve of pullover shirt/dress, Put head through opening of  pull over shirt/dress, Pull shirt over trunk Upper body dressing/undressing: 6: More than reasonable amount of time FIM - Lower Body Dressing/Undressing Lower body dressing/undressing steps patient completed: Thread/unthread right pants leg, Thread/unthread left pants leg, Pull pants up/down, Don/Doff left shoe, Don/Doff left sock, Fasten/unfasten left shoe Lower body dressing/undressing: 4: Steadying Assist  FIM - Toileting Toileting steps completed by patient: Adjust clothing prior to toileting, Performs perineal hygiene, Adjust clothing after toileting Toileting Assistive Devices: Grab bar or rail for support Toileting: 5: Supervision: Safety issues/verbal cues  FIM - Radio producer Devices: Bedside commode (drop arm BSC) Toilet  Transfers: 5-To toilet/BSC: Supervision (verbal cues/safety issues), 5-From toilet/BSC: Supervision (verbal cues/safety issues)  FIM - Control and instrumentation engineer Devices: Arm rests Bed/Chair Transfer: 5: Bed > Chair or W/C: Supervision (verbal cues/safety issues), 5: Chair or W/C > Bed: Supervision (verbal cues/safety issues), 6: Supine > Sit: No assist, 6: Sit > Supine: No assist  FIM - Locomotion: Wheelchair Distance: 200 Locomotion: Wheelchair: 5: Travels 150 ft or more: maneuvers on rugs and over door sills with supervision, cueing or coaxing FIM - Locomotion: Ambulation Locomotion: Ambulation Assistive Devices: Administrator Ambulation/Gait Assistance: 5: Supervision Locomotion: Ambulation: 1: Travels less than 50 ft with supervision/safety issues  Comprehension Comprehension Mode: Auditory Comprehension: 5-Follows basic conversation/direction: With extra time/assistive device  Expression Expression Mode: Verbal Expression: 5-Expresses basic needs/ideas: With no assist  Social Interaction Social Interaction Mode: Not assessed Social Interaction: 6-Interacts appropriately with others with medication or extra time (anti-anxiety, antidepressant).  Problem Solving Problem Solving Mode: Not assessed Problem Solving: 5-Solves basic 90% of the time/requires cueing < 10% of the time  Memory Memory Mode:  (UTA pt speaks Arabic ) Memory: 4-Recognizes or recalls 75 - 89% of the time/requires cueing 10 - 24% of the time  Medical Problem List and Plan: 1. Functional deficits secondary to right BKA secondary to PVD 2. DVT Prophylaxis/Anticoagulation: D/C Subcutaneous heparin  3.  DM-CBGs trending up, increase lantus, may be intake is increasing, change lantus to 25Units LOS (Days) 8 A FACE TO FACE EVALUATION WAS PERFORMED  KIRSTEINS,ANDREW E 06/03/2014, 8:07 AM

## 2014-06-03 NOTE — Progress Notes (Signed)
Education on insulin pen and lantus given to husband and patient via Optometrist. Nurse provided demonstration of use of pen with husband returning proper demonstration of use to nurse.  Husband is confident and no further questions at this time.

## 2014-06-03 NOTE — Discharge Instructions (Signed)
Inpatient Rehab Discharge Instructions  Kimberly Lane Discharge date and time: No discharge date for patient encounter.   Activities/Precautions/ Functional Status: Activity: activity as tolerated Diet: diabetic diet Wound Care: keep wound clean and dry Functional status:  ___ No restrictions     ___ Walk up steps independently _x__ 24/7 supervision/assistance   ___ Walk up steps with assistance ___ Intermittent supervision/assistance  ___ Bathe/dress independently ___ Walk with walker     ___ Bathe/dress with assistance ___ Walk Independently    ___ Shower independently ___ Walk with assistance    ___ Shower with assistance ___ No alcohol     ___ Return to work/school ________  COMMUNITY REFERRALS UPON DISCHARGE:   Home Health:    RN   Agency:  [facility] Phone:  [phone] Medical Equipment/Items Ordered: 16x18 wheelchair with 16x18 basic cushion;  Bedside commode;  Youth Conservation officer, nature;  Hospital Bed  Agency/Supplier:  [facility]      Phone:  [phone]  GENERAL COMMUNITY RESOURCES FOR PATIENT/FAMILY: Support Groups:  [facility] Am[facility]ee Support Group Memory Dance - [phone]  Special Instructions: Follow-up with Dr. Kellie Simmering in 2 weeks in regards to amputation surgical site   My questions have been answered and I understand these instructions. I will adhere to these goals and the provided educational materials after my discharge from the hospital.  Patient/Caregiver Signature _______________________________ Date __________  Clinician Signature _______________________________________ Date __________  Please bring this form and your medication list with you to all your follow-up doctor's appointments.

## 2014-06-03 NOTE — Progress Notes (Signed)
Pt discharged to home with husband at 58. Belongings with pt. Discharge instructions given to pt and husband by Silvestre Mesi, [place] with translator present. No further questions

## 2014-06-04 NOTE — Progress Notes (Addendum)
Social Work Discharge Note  The overall goal for the admission was met for:   Discharge location: Yes - home   Length of Stay: Yes - 8 days  Discharge activity level: Yes - supervision with some minimal assistance  Home/community participation: Yes  Services provided included: MD, RD, PT, OT, RN, Pharmacy and SW  Financial Services: Other: Pt has applied for Medicaid  Follow-up services arranged: Home Health: RN, DME: [phone] wheelchair with right amputee support pad; 16x18 basic cushion; youth rolling walker; hospital bed; bedside commode and Patient/Family has no preference for HH/DME agencies  Comments (or additional information):  Pt to go to her home with her husband to provide supervision.  Pt has some friends in her community and mosque who are supportive.  Pt also has [facility] [employer] which provides good support for her.  Pt feels ready to go home.  Patient/Family verbalized understanding of follow-up arrangements: Yes  Individual responsible for coordination of the follow-up plan: pt and her husband  Confirmed correct DME delivered: Trey Sailors 06/04/2014    Adrien Dietzman, Silvestre Mesi

## 2014-06-09 ENCOUNTER — Inpatient Hospital Stay: Payer: No Typology Code available for payment source | Admitting: Family Medicine

## 2014-06-12 ENCOUNTER — Encounter (HOSPITAL_COMMUNITY): Payer: Self-pay

## 2014-06-12 ENCOUNTER — Emergency Department (HOSPITAL_COMMUNITY)
Admission: EM | Admit: 2014-06-12 | Discharge: 2014-06-12 | Disposition: A | Discharge status: Home / Self Care | Payer: No Typology Code available for payment source | Attending: Emergency Medicine | Admitting: Emergency Medicine

## 2014-06-12 ENCOUNTER — Telehealth: Payer: Self-pay | Admitting: Student

## 2014-06-12 DIAGNOSIS — Z79899 Other long term (current) drug therapy: Secondary | ICD-10-CM | POA: Insufficient documentation

## 2014-06-12 DIAGNOSIS — Z794 Long term (current) use of insulin: Secondary | ICD-10-CM | POA: Insufficient documentation

## 2014-06-12 DIAGNOSIS — Z7982 Long term (current) use of aspirin: Secondary | ICD-10-CM | POA: Insufficient documentation

## 2014-06-12 DIAGNOSIS — S8991XA Unspecified injury of right lower leg, initial encounter: Secondary | ICD-10-CM | POA: Insufficient documentation

## 2014-06-12 DIAGNOSIS — Y929 Unspecified place or not applicable: Secondary | ICD-10-CM | POA: Insufficient documentation

## 2014-06-12 DIAGNOSIS — Z89511 Acquired absence of right leg below knee: Secondary | ICD-10-CM | POA: Insufficient documentation

## 2014-06-12 DIAGNOSIS — W1839XA Other fall on same level, initial encounter: Secondary | ICD-10-CM | POA: Insufficient documentation

## 2014-06-12 DIAGNOSIS — Y939 Activity, unspecified: Secondary | ICD-10-CM | POA: Insufficient documentation

## 2014-06-12 DIAGNOSIS — Y998 Other external cause status: Secondary | ICD-10-CM | POA: Insufficient documentation

## 2014-06-12 DIAGNOSIS — Z872 Personal history of diseases of the skin and subcutaneous tissue: Secondary | ICD-10-CM | POA: Insufficient documentation

## 2014-06-12 DIAGNOSIS — M79604 Pain in right leg: Secondary | ICD-10-CM

## 2014-06-12 NOTE — Telephone Encounter (Signed)
Will forward to Dr. Lincoln Brigham. Nasteho Glantz,CMA

## 2014-06-12 NOTE — ED Notes (Signed)
Per EMS - pt went to get up to use restroom, rolled out of bed & landed on recently amputated right leg. Pt c/o pain in right leg; denies LOC; denies pain in head/back/neck. Pt hx of diabetes. Right upper quadrant pain w/ palpation. CBG 312, bp 158/70, 100bpm.

## 2014-06-12 NOTE — ED Notes (Signed)
Pt given a taxi voucher

## 2014-06-12 NOTE — Telephone Encounter (Signed)
Siddigh a interpreter for the patient called and needs to speak to Dr. Lincoln Brigham and discuss the patient blood sugar that is over 244. Please all jw

## 2014-06-12 NOTE — Discharge Instructions (Signed)
HOME HEALTH PLEASE READ: Patient was seen last night after falling landing on her right leg. No signs of dehiscence or trauma to the extremity that would indicate further diagnostic studies at this time. During my evaluation patient and her husband were not able to clearly indicate follow-up care or appointments. It is my understanding from them that he will contact with Dr. Kellie Simmering regarding patient's care. I noticed in the chart that they were scheduled for a follow-up appointment mid April, unsure if they made this, I also saw another one for her to be seen early May for another follow-up. If at all possible that she please call Dr. Evelena Leyden office and clarify their next follow-up appointment and please inform them of this. Thank you for your help. I encouraged them to continue using at home pain medication for this worsening of pain, she is reported taking excess help.

## 2014-06-13 NOTE — ED Provider Notes (Signed)
CSN: [id number]     Arrival date & time 06/12/14  2139 History   First MD Initiated Contact with Patient 06/12/14 2144     Chief Complaint  Patient presents with  . Leg Pain    HPI   Translator used this patient speaks [name]  54 year old female patient presents today with right lower leg pain. Patient is status post below-the-knee amputation performed by Tinnie Gens M.D. on 05/23/2014. She reports that she stumbled this evening fell, landed on her right extremity. She noted extreme pain at that time, taking her Percocet as prescribed for pain with some resolution of symptoms . Patient denies any other pain from the fall today. She reports that home health sees her every day, but reports that she has not been to see Dr. Kellie Simmering for her follow-up appointment. She denies swelling, radiation of pain, or trauma from the fall.  Past Medical History  Diagnosis Date  . Diabetes mellitus   . Hypertension   . Cellulitis and abscess of foot 05/13/2014    rt foot  . 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 Abdominal aortagram N/A 05/20/2014    Procedure: ABDOMINAL Maxcine Ham;  Surgeon: Serafina Mitchell, MD;  [facility]: [facility] C[facility] LAB;  Service: Cardiovascular;  Laterality: N/A;  . Amputation Right 05/23/2014    Procedure: AMPUTATION BELOW KNEE;  Surgeon: Mal Misty, MD;  Location: [place];  Service: Vascular;  Laterality: Right;   Family History  Problem Relation Age of Onset  . Heart disease      No family history   History  Substance Use Topics  . Smoking status: Never Smoker   . Smokeless tobacco: Never Used  . Alcohol Use: No   OB History    No data available     Review of Systems  All other systems reviewed and are negative.   Allergies  Ace inhibitors and Bactrim  Home Medications   Prior to Admission medications   Medication Sig Start Date End Date Taking? Authorizing Provider  acetaminophen (TYLENOL) 500 MG tablet Take 1 tablet (500 mg  total) by mouth every 6 (six) hours as needed. Patient taking differently: Take 500 mg by mouth every 6 (six) hours as needed for mild pain.  01/15/14   [name], MD  amLODipine (NORVASC) 10 MG tablet Take 1 tablet (10 mg total) by mouth daily. 06/03/14   Lavon Paganini Angiulli, PA-C  aspirin 81 MG tablet Take 1 tablet (81 mg total) by mouth daily. Patient not taking: Reported on 05/13/2014 11/13/13   Zenia Resides, MD  atorvastatin (LIPITOR) 40 MG tablet Take 1 tablet (40 mg total) by mouth daily. 06/03/14   Lavon Paganini Angiulli, PA-C  insulin glargine (LANTUS) 100 UNIT/ML injection Inject 0.25 mLs (25 Units total) into the skin daily. 06/04/14   Lavon Paganini Angiulli, PA-C  lactobacillus acidophilus (BACID) TABS tablet Take 2 tablets by mouth 3 (three) times daily. 06/03/14   Lavon Paganini Angiulli, PA-C  lisinopril (PRINIVIL,ZESTRIL) 20 MG tablet Take 1 tablet (20 mg total) by mouth daily. 06/03/14   Lavon Paganini Angiulli, PA-C  oxyCODONE (OXY IR/ROXICODONE) 5 MG immediate release tablet Take 1 tablet (5 mg total) by mouth every 4 (four) hours as needed for moderate pain or severe pain. 06/03/14   Lavon Paganini Angiulli, PA-C  OxyCODONE (OXYCONTIN) 10 mg T12A 12 hr tablet Take 1 tablet (10 mg total) by mouth every 12 (twelve) hours. 06/03/14   Lavon Paganini [place],  PA-C  polyethylene glycol (MIRALAX / GLYCOLAX) packet Take 17-34 g by mouth daily as needed for moderate constipation. 05/26/14   Vivi Barrack, MD   BP 170/70 mmHg  Pulse 96  Temp(Src) 98.8 F (37.1 C)  Resp 18  SpO2 100% Physical Exam  Constitutional: She is oriented to person, place, and time. She appears well-developed and well-nourished.  HENT:  Head: Normocephalic and atraumatic.  Eyes: Conjunctivae are normal. Pupils are equal, round, and reactive to light. Right eye exhibits no discharge. Left eye exhibits no discharge. No scleral icterus.  Neck: Normal range of motion. No JVD present. No tracheal deviation present.  Pulmonary/Chest: Effort normal.  No stridor.  Musculoskeletal:  Right below-the-knee amputation with intact staples, no signs of infection including redness, swelling, discharge. No acute findings.  Neurological: She is alert and oriented to person, place, and time. Coordination normal.  Psychiatric: She has a normal mood and affect. Her behavior is normal. Judgment and thought content normal.  Nursing note and vitals reviewed.   ED Course  Procedures (including critical care time) Labs Review Labs Reviewed - No data to display  Imaging Review No results found.   EKG Interpretation None      MDM   Final diagnoses:  Pain of right lower extremity   Patient's fall today was mechanical in nature. Her surgical site looks clean, dry, intact with no signs of infection or trauma to the extremity. No acute concern for further evaluation at this time. Patient has not followed up for her regular scheduled appointments with her surgeon. In the discharge information again the patient today I left a note for home health to read explaining the need for follow-up with surgeon, requesting that they contact him tomorrow and assure follow-up evaluation. Instructed patient to continue using her home Percocet as needed for the pain, and monitor for signs of infection. Patient and her husband both understood and agreed to this plan.     Okey Regal, PA-C 06/13/14 0008  Tanna Furry, MD 06/24/14 [phone]

## 2014-06-17 DIAGNOSIS — Z794 Long term (current) use of insulin: Secondary | ICD-10-CM

## 2014-06-17 DIAGNOSIS — Z89511 Acquired absence of right leg below knee: Secondary | ICD-10-CM

## 2014-06-17 DIAGNOSIS — I10 Essential (primary) hypertension: Secondary | ICD-10-CM

## 2014-06-17 DIAGNOSIS — E1140 Type 2 diabetes mellitus with diabetic neuropathy, unspecified: Secondary | ICD-10-CM

## 2014-06-17 DIAGNOSIS — E785 Hyperlipidemia, unspecified: Secondary | ICD-10-CM

## 2014-06-23 ENCOUNTER — Encounter: Payer: Self-pay | Admitting: Vascular Surgery

## 2014-06-24 ENCOUNTER — Encounter: Payer: No Typology Code available for payment source | Admitting: Vascular Surgery

## 2014-06-27 ENCOUNTER — Encounter: Payer: Self-pay | Admitting: Vascular Surgery

## 2014-07-01 ENCOUNTER — Encounter: Payer: Self-pay | Admitting: Vascular Surgery

## 2014-07-01 ENCOUNTER — Ambulatory Visit (INDEPENDENT_AMBULATORY_CARE_PROVIDER_SITE_OTHER): Payer: Self-pay | Admitting: Vascular Surgery

## 2014-07-01 VITALS — BP 152/77 | HR 76 | Ht 60.0 in | Wt 120.5 lb

## 2014-07-01 DIAGNOSIS — S88111S Complete traumatic amputation at level between knee and ankle, right lower leg, sequela: Secondary | ICD-10-CM

## 2014-07-01 DIAGNOSIS — I739 Peripheral vascular disease, unspecified: Secondary | ICD-10-CM

## 2014-07-01 NOTE — Progress Notes (Signed)
Subjective:     Patient ID: Kimberly Lane, female   DOB: [birth year], 54 y.o.   MRN: [id number]  HPI this 54 year old female presented with a nonhealing wound on the right foot with gangrene of the right fifth toe and non-reconstructable tibial occlusive disease. She had a right below-knee amputation performed by me on 05/23/2014. She reports some mild discomfort around the incision. She denies phantom pain. She has had no chills and fever or drainage. Review of Systems     Objective:   Physical Exam BP 152/77 mmHg  Pulse 76  Ht 5' (1.524 m)  Wt 120 lb 8 oz (54.658 kg)  BMI 23.53 kg/m2  SpO2 100%  Gen. well-developed well-nourished female in no apparent distress-accompanied by interpreter does not speak English Right below-knee amputation stump has healed nicely with no evidence of erythema, drainage, or fluctuance. Skin staples removed. No evidence of ischemic ulcers and left lower extremity    Assessment:     Doing well 5 weeks post right below-knee amputation for gangrene right foot with unreconstructable tibial occlusive disease    Plan:     Will return to see [place] on when necessary basis

## 2014-07-04 ENCOUNTER — Ambulatory Visit (HOSPITAL_BASED_OUTPATIENT_CLINIC_OR_DEPARTMENT_OTHER): Payer: Medicaid Other | Admitting: Physical Medicine & Rehabilitation

## 2014-07-04 ENCOUNTER — Encounter: Payer: Self-pay | Admitting: Physical Medicine & Rehabilitation

## 2014-07-04 ENCOUNTER — Encounter: Payer: No Typology Code available for payment source | Attending: Physical Medicine & Rehabilitation

## 2014-07-04 VITALS — BP 149/83 | HR 72 | Resp 14

## 2014-07-04 DIAGNOSIS — Z89511 Acquired absence of right leg below knee: Secondary | ICD-10-CM

## 2014-07-04 NOTE — Patient Instructions (Addendum)
Try tylenol 500mg  one or tow tablets 3 times a day as needed for pain  I wrote a prescription for a stump shrinker and a below knee prosthetic Take this to [place] clinic  See me after you receive the artificial leg   Your primary care doctor will take care of your blood pressure as well as to her diabetes [facility] [name], MD,  Phone: [phone]

## 2014-07-04 NOTE — Progress Notes (Signed)
Subjective:    Patient ID: Kimberly Lane, female    DOB: 06-21-1960, 54 y.o.   MRN: [id number] 54 year old right-handed female, limited English speaking refugee with history of hypertension, diabetes mellitus, who presented on May 20, 2014, with right fifth toe wound that occurred approximately 2 weeks ago after she hit her toe against a chair at home.  She lives with her husband and had been using a wheelchair prior to admission.  Wound continued to progress with cellulitic changes.  X-rays with no evidence of osteomyelitis.  Noted soft tissue swelling.  MRI of the foot showed soft tissue laceration involving the right toe, progressive ischemic changes.  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  Limb was not felt to be salvageable and underwent right below-knee amputation on May 23, 2014, per Dr. Kellie Simmering.  DATE OF ADMISSION:  05/26/2014 DATE OF DISCHARGE:  06/03/2014  HPI Patient is here with her husband as well as another family member who serves as an Astronomer. The actual interpreter did show up however the Patient preferred to useThe family member Review Chart: Vascular surgery removed staples right residual limb, well healed, they are not planning any follow- No primary care follow-up since discharge from rehabilitation. Husband and patient are unaware of who with her primary care physician is. After reviewing chart [facility] Also asking about artificial limb  Ambulating with walker in house, Husband is assisting her  Review of systems: Negative for phantom pain, no other pain complaints in the left lower or upper extremities Pain Inventory Average Pain 2 Pain Right Now 2 My pain is itchy  In the last 24 hours, has pain interfered with the following? General activity 0 Relation with others 0 Enjoyment of life 0 What TIME of day is your pain at its worst? none Sleep (in general) Good  Pain is worse  with: none Pain improves with: none Relief from Meds: none  Mobility walk with assistance use a cane use a walker ability to climb steps?  no do you drive?  no use a wheelchair  Function not employed: date last employed .  Neuro/Psych No problems in this area  Prior Studies Any changes since last visit?  no  Physicians involved in your care Any changes since last visit?  no   Family History  Problem Relation Age of Onset  . Heart disease      No family history   History   Social History  . Marital Status: Married    Spouse Name: N/A  . Number of Children: N/A  . Years of Education: N/A   Social History Main Topics  . Smoking status: Never Smoker   . Smokeless tobacco: Never Used  . Alcohol Use: No  . Drug Use: No  . Sexual Activity: Yes    Birth Control/ Protection: Post-menopausal   Other Topics Concern  . None   Social History Narrative   Past Surgical History  Procedure Laterality Date  . Abdominal aortagram N/A 05/20/2014    Procedure: ABDOMINAL Maxcine Ham;  Surgeon: Serafina Mitchell, MD;  Location: [facility] CATH LAB;  Service: Cardiovascular;  Laterality: N/A;  . Amputation Right 05/23/2014    Procedure: AMPUTATION BELOW KNEE;  Surgeon: Mal Misty, MD;  Location: [place];  Service: Vascular;  Laterality: Right;   Past Medical History  Diagnosis Date  . Diabetes mellitus   . Hypertension   . Cellulitis and abscess of foot 05/13/2014    rt foot  .  Positive TB test 2013   BP 149/83 mmHg  Pulse 72  Resp 14  SpO2 98%  Opioid Risk Score:   Fall Risk Score: Moderate Fall Risk (6-13 points)`1  Depression screen PHQ 2/9  Depression screen [facility] 2/9 05/13/2014 01/15/2014  Decreased Interest 0 1  Down, Depressed, Hopeless 0 0  PHQ - 2 Score 0 1     Review of Systems     Objective:   Physical Exam Right residual limb is well healed there is still mild edema. There is no tenderness to palpation, no erythema. She has good knee range of motion with  flexion and extension. No evidence of knee joint swelling No evidence of skin breakdown in the right lower extremity Mood and affect are appropriate Gen. No acute distress       Assessment & Plan:  1. Right BKA secondary to peripheral vascular disease approximately 6 weeks postop. Skin is will healed but there is still some residual limb edema. Made referral to [place] clinic for stump shrinker as well as to get the process going for prosthetic measurement. As per her interpreter, there is a Medicaid application in process and they have been told that it will take about 45 days. Went over her health care needs, hypertension and diabetes management will be handled through family [place]. I gave patient and interpreter and husband the number of the clinic and instructed them to follow up  Return to clinic 3 months , I would anticipate she should be fitted with her prosthesis by that time and ready to start some outpatient PT  In terms of pain management does not appear to be having much pain. I have recommended Tylenol 500 mg 1-2 tablets 3 times per day as needed

## 2014-07-08 ENCOUNTER — Telehealth: Payer: Self-pay | Admitting: Student

## 2014-07-08 DIAGNOSIS — M869 Osteomyelitis, unspecified: Secondary | ICD-10-CM | POA: Insufficient documentation

## 2014-07-08 NOTE — Telephone Encounter (Signed)
Antonieta Pert, with the [facility], is visiting the home of the patient and her blood pressure was 186/80. The patient informed Lucita Ferrara that the nurse who had been coming instructed her to stop taking Lisinopril because she was "allergic" to it. That nurse then told the patient to take something else. No one knows what the "something else" is. The patient has run out, and Lucita Ferrara would like a phone call with instruction as to what do to next. If whether or not she was supposed to change medication, and if so, then what? Sadie Reynolds, ASA

## 2014-07-08 NOTE — Telephone Encounter (Signed)
Will forward to MD. Shakinah Navis,CMA  

## 2014-07-09 NOTE — Telephone Encounter (Signed)
Spoke with Antonieta Pert from [facility] and she stated that she saw pt again today and BP was still elevated higher at 200/80 and denies any sx today. She still had not heard from our office for advice. Spoke with preceptor today (Dr. Mingo Amber) per [facility] request and he stated for pt to restart Lisinopril and Amlodipine and to follow up in a few days with office to check BP status. Scheduled appt with Dr. Venetia Maxon on 07/11/14 at 9:30am. Lucita Ferrara was with pt while advising pt what to do and of her appt Friday and she verbalized understanding. Torrey Ballinas, CMA.

## 2014-07-11 ENCOUNTER — Ambulatory Visit (INDEPENDENT_AMBULATORY_CARE_PROVIDER_SITE_OTHER): Payer: No Typology Code available for payment source | Admitting: Family Medicine

## 2014-07-11 ENCOUNTER — Encounter: Payer: Self-pay | Admitting: Family Medicine

## 2014-07-11 VITALS — BP 171/67 | HR 60 | Temp 97.6°F

## 2014-07-11 DIAGNOSIS — IMO0002 Reserved for concepts with insufficient information to code with codable children: Secondary | ICD-10-CM

## 2014-07-11 DIAGNOSIS — E1165 Type 2 diabetes mellitus with hyperglycemia: Secondary | ICD-10-CM

## 2014-07-11 DIAGNOSIS — I10 Essential (primary) hypertension: Secondary | ICD-10-CM

## 2014-07-11 MED ORDER — LOSARTAN POTASSIUM 50 MG PO TABS
50.0000 mg | ORAL_TABLET | Freq: Every day | ORAL | Status: DC
Start: 2014-07-11 — End: 2014-08-15

## 2014-07-11 MED ORDER — LISINOPRIL 20 MG PO TABS: 20.0000 mg | ORAL_TABLET | Freq: Every day | ORAL | Status: DC

## 2014-07-11 MED ORDER — AMLODIPINE BESYLATE 10 MG PO TABS: 10.0000 mg | ORAL_TABLET | Freq: Every day | ORAL | Status: DC

## 2014-07-11 NOTE — Assessment & Plan Note (Signed)
A: BP still elevated but no systemic symptoms. Cough apparently due to ACE (per chart review, she was on Hyzaar at one point last year with much less cough); unclear as to why this was restarted.  P: Refilled amlodipine 10 mg daily and gave new Rx for losartan 50 mg daily. Advised pt to f/u with PCP Dr. Lincoln Brigham in a few weeks; appt made for June 15 at 2:30 PM. Pt and family voice understanding.

## 2014-07-11 NOTE — Progress Notes (Signed)
   Subjective:    Patient ID: Kimberly Lane, female    DOB: 07/10/1960, 54 y.o.   MRN: [id number]  In-person Arabic interpretation provided by Kimberly Lane).  HPI: Pt presents to clinic for f/u of HTN. She states she is out of her medications; she cannot tell what medications she takes but states she takes 2. She last took the medications yesterday. She has no chest pain, SOB, but does have some cough / congestion (present for about 1 month). There are some phone notes suggesting that she may have been told at one point that the lisinopril could be causing some cough, but she continued to take it (see recent phone notes).  Of note, she was recently discharged from [place] for rehab after a right below-the-knee amputation. Her blood pressure there was managed with lisinopril and amlodipine. Note amlodipine is on her allergy list but the allergy is listed as a cough. She is on Lantus for her diabetes and requests a refill.  Review of Systems: As above. Denies fevers, chills, N/V.     Objective:   Physical Exam BP 171/67 mmHg  Pulse 60  Temp(Src) 97.6 F (36.4 C) (Oral) Manual recheck BP: 162/72 Gen: well-appearing adult female in NAD HEENT: [place]/AT, EOMI, PERRLA, MMM  Posterior oropharynx mildly erythematous Cardio: RRR, no murmur appreciated Pulm: CTAB, no wheezes, normal WOB Abd: soft, nontender, BS+ Ext: warm, well-perfused, no LE edema on the left; s/p BKA on the right     Assessment & Plan:  54yo female with persistent hypertension and uncontrolled DM - see HTN problem list note - incidentally refilled Lantus but needs f/u with PCP Dr. [name] for further adjustments  Note FYI to Dr. [name].  Kimberly Kluver, MD PGY-3, [facility] 07/11/2014, 11:36 AM

## 2014-07-11 NOTE — Patient Instructions (Signed)
Thank you for coming in, today!  I will refill your blood pressure medicines, today. Keep taking amlodipine 10 mg once per day. I will change your lisinopril -- STOP taking lisinopril. START taking losartan. These medicines are similar, but losartan should not cause a cough.  Come back to see Dr. Lincoln Brigham on June 15th at 2:30 in the afternoon. Call or come back any time before then if you need. Please feel free to call with any questions or concerns at any time, at [phone]. --Dr. Venetia Maxon

## 2014-08-06 ENCOUNTER — Ambulatory Visit (INDEPENDENT_AMBULATORY_CARE_PROVIDER_SITE_OTHER): Payer: No Typology Code available for payment source | Admitting: Student

## 2014-08-06 ENCOUNTER — Encounter: Payer: Self-pay | Admitting: Student

## 2014-08-06 VITALS — BP 152/70 | HR 71 | Temp 98.3°F | Ht 60.0 in

## 2014-08-06 DIAGNOSIS — E118 Type 2 diabetes mellitus with unspecified complications: Secondary | ICD-10-CM

## 2014-08-06 DIAGNOSIS — I10 Essential (primary) hypertension: Secondary | ICD-10-CM

## 2014-08-06 DIAGNOSIS — B379 Candidiasis, unspecified: Secondary | ICD-10-CM

## 2014-08-06 DIAGNOSIS — E1165 Type 2 diabetes mellitus with hyperglycemia: Secondary | ICD-10-CM

## 2014-08-06 DIAGNOSIS — IMO0002 Reserved for concepts with insufficient information to code with codable children: Secondary | ICD-10-CM

## 2014-08-06 LAB — POCT GLYCOSYLATED HEMOGLOBIN (HGB A1C): HEMOGLOBIN A1C: 9.9

## 2014-08-06 MED ORDER — FLUCONAZOLE 150 MG PO TABS: 150.0000 mg | ORAL_TABLET | Freq: Once | ORAL | Status: DC

## 2014-08-06 NOTE — Progress Notes (Signed)
   Subjective:    Patient ID: Kimberly Lane, female    DOB: [birth year], 54 y.o.   MRN: [id number]   CC: DM check  History taken with Arabic interpreter present  HPI 54 y/o with h/o HTN, PVD and poorly controlled DM with recent ( 4/1) right BKA for non healing foot ulcer  DM with PVD - Today A1c 9.6 which is where it has been for some time. Currently she is on 25 u Lantus qHS. She does not regularly check her blood sugars, but states that when her blood sugars are high, she has vaginal itching. She does not currently have vaginal itching. She is followed by vascular surgery since her BKA. She reports she has applied for medicaid and that she does have the orange card  HTN - Recently seen as SDA for htn as she was out of her medications. She was previously on lisinopril and amlodipine and had run out.  At that time she reported cough with lisinopril concerning for allergy. She was started on losartan and her amlodipine refilled. She continues to deny headache, chest pain shortness of breath.    Review of Systems   See HPI for ROS.  Past medical history, surgical, family, and social history reviewed and updated in the EMR as appropriate.  Past Medical History  Diagnosis Date  . Diabetes mellitus   . Hypertension   . Cellulitis and abscess of foot 05/13/2014    rt foot  . 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 Marital Status: Married    Spouse Name: N/A  . Number of Children: N/A  . Years of Education: N/A   Occupational History  . Not on file.   Social History Main Topics  . Smoking status: Never Smoker   . Smokeless tobacco: Never Used  . Alcohol Use: No  . Drug Use: No  . Sexual Activity: Yes    Birth Control/ Protection: Post-menopausal   Other Topics Concern  . Not on file   Social History Narrative    Objective:  BP 152/70 mmHg  Pulse 71  Temp(Src) 98.3 F (36.8 C) (Oral)  Ht 5' (1.524 m)  Wt  Vitals and nursing note reviewed  General:  NAD Cardiac: RRR, normal heart sounds, no murmurs.  Respiratory: CTAB, normal effort Abdomen: soft, nontender, nondistended. Bowel sounds present Extremities: Right BKA, skin intact and well healed, left foot with weak DP/PT pulses but intact to palpation and no skin trauma Skin: warm and dry, no rashes noted Neuro: alert and oriented, no focal deficits   Assessment & Plan:  See Problem List      Kimberly Lane A. Lincoln Brigham MD, [facility] Resident PGY-1 Pager [phone]

## 2014-08-06 NOTE — Assessment & Plan Note (Addendum)
A1c 9.6 today with lantus 25 u as her only DM medication - Given insurance status and need for more aggressive regimen will refer to pharmacy clinic to help optimize her medication - Address need for ophtho visit at next visit - Diflucan Rx given for likely yeast vaginitis associated with hyperglycemia, The Rx printed and pt directed to the [place] as Fluconazole is on the $4 list

## 2014-08-06 NOTE — Patient Instructions (Signed)
Please follow up in pharmacy clinic in 1-2 weeks Please follow up with Dr Lincoln Brigham in 3 months for A1c Please take Fluconazole 1 pill for vaginal itching, if no relief in 3 days, take another, you were given 5 refills Please maintain health diet with less sweets, bread and fats

## 2014-08-06 NOTE — Assessment & Plan Note (Signed)
Blood pressure elevated to 153/68 the 152/71 on repeat approximately 45 mins later.  As she has a history of poosly controlled hypertension with severe vascular disease as evidenced by recent BKA, will hold on adding BP meds tfor fear of dropping blood pressure too low quickly - Will continue to assess blood pressures and slowly increase blood pressure medication

## 2014-08-15 ENCOUNTER — Ambulatory Visit (INDEPENDENT_AMBULATORY_CARE_PROVIDER_SITE_OTHER): Payer: Self-pay | Admitting: Pharmacist

## 2014-08-15 VITALS — BP 154/64 | HR 73

## 2014-08-15 DIAGNOSIS — E1165 Type 2 diabetes mellitus with hyperglycemia: Secondary | ICD-10-CM

## 2014-08-15 DIAGNOSIS — E118 Type 2 diabetes mellitus with unspecified complications: Secondary | ICD-10-CM

## 2014-08-15 DIAGNOSIS — IMO0002 Reserved for concepts with insufficient information to code with codable children: Secondary | ICD-10-CM

## 2014-08-15 DIAGNOSIS — I10 Essential (primary) hypertension: Secondary | ICD-10-CM

## 2014-08-15 MED ORDER — INSULIN GLARGINE 100 UNIT/ML SOLOSTAR PEN: 25.0000 [IU] | PEN_INJECTOR | Freq: Every day | SUBCUTANEOUS | Status: DC

## 2014-08-15 MED ORDER — HYDROCHLOROTHIAZIDE 25 MG PO TABS: 25.0000 mg | ORAL_TABLET | Freq: Every day | ORAL | Status: DC

## 2014-08-15 NOTE — Assessment & Plan Note (Signed)
Diabetes currently uncontrolled based on A1c of 9.9 but under improved control recently due to readings at home in the 110s-140s.   Denies hypoglycemic events and is able to verbalize appropriate hypoglycemia management plan.  Reports adherence with insulin and blood pressure medications but is not compliant with other medications. Control is suboptimal due to dietary indiscretion, lack of physical activity, and inability to afford medications. Continued basal insulin Lantus (insulin glargine) dose of 25 units daily. Provided patient with a sample of Lantus pen while she sets up financial assistance. Patient re-educated on use of the Lantus pen. Will likely need to adjust Lantus and consider the addition of meal time coverage (rapid-acting insulin) when Ramadan is over.

## 2014-08-15 NOTE — Patient Instructions (Addendum)
It was nice to meet you today.  Continue to take Lantus 25 units every day. We will likely have to adjust your insulin once you are done fasting.  Stop the losartan and amlodipine.   Use the $4 to pick up the hydrochlorothiazide at [facility] with any questions: [phone]

## 2014-08-15 NOTE — Assessment & Plan Note (Signed)
Diabetes currently uncontrolled based on A1c of 9.9 but under improved control recently due to readings at home in the 110s-140s.   Denies hypoglycemic events and is able to verbalize appropriate hypoglycemia management plan.  Reports adherence with insulin and blood pressure medications but is not compliant with other medications. Control is suboptimal due to dietary indiscretion, lack of physical activity, and inability to afford medications. Continued basal insulin Lantus (insulin glargine) dose of 25 units daily. Provided patient with a sample of Lantus pen while she sets up financial assistance. Patient re-educated on use of the Lantus pen. Will likely need to adjust Lantus and consider the addition of meal time coverage (rapid-acting insulin) when Ramadan is over.   Hypertension: currently uncontrolled based on blood pressure reading of 154/64. Took last dose of amlodipine and losartan last night and is unable to get refills due to financial issues. Swelling of arms may be due to amlodipine, though it is typically seen in the lower extremities. Discontinue amlodipine and losartan. If amlodipine is ever restarted, consider starting at a lower dose as swelling is dose-related. Initiated hydrochlorothiazide 25 mg daily. Patient was given $4 to take to [facility] to pick up the medication. Follow up blood pressure at next visit.

## 2014-08-15 NOTE — Progress Notes (Signed)
S:    Patient arrives in a wheelchair and with her husband. The entire visit is conducted with an interpreter (patient speaks [name] Arabic). Presents for diabetes follow up.   Patient reports adherence with insulin and blood pressure medications but is not taking any other medications due to financial issues. Current diabetes medication is Lantus 25 units daily, given at night. Patient reports taking amlodipine and losartan until last night (08/14/14) but ran out and is unable to afford to get a refill.  She reports swelling of her arms that started around the time she started taking amlodipine. She attributes the swelling to the amlodipine and would like to stop this medication.  Patient reported dietary habits: Patient has recently been fasting during the day due to observance of Ramadan. Therefore, she only eats one meal a day.   Patient reported exercise habits: unable to exercise due to BKA.  Patient and husband report that they have no steady income. The husband lost his job. They have been supported by their community. They report that the patient used to have Medicaid and just found out today that the coverage has been dropped. They also report that they need assistance with transportation.    O:   Lab Results  Component Value Date   HGBA1C 9.9 08/06/2014    Patient did not bring meter to visit. Home fasting CBG: none recently, her husband has only been checking after dinner. 2 hour post-prandial/random CBG: 110s-140s  BP Readings from Last 3 Encounters:  08/15/14 154/64  08/06/14 152/70  07/11/14 171/67   Physical examination of arms: 1-2+ pitting edema  A/P: Diabetes currently uncontrolled based on A1c of 9.9 but under improved control recently due to readings at home in the 110s-140s.   Denies hypoglycemic events and is able to verbalize appropriate hypoglycemia management plan.  Reports adherence with insulin and blood pressure medications but is not compliant with other  medications. Control is suboptimal due to dietary indiscretion, lack of physical activity, and inability to afford medications. Continued basal insulin Lantus (insulin glargine) dose of 25 units daily. Provided patient with a sample of Lantus pen while she sets up financial assistance. Patient re-educated on use of the Lantus pen. Will likely need to adjust Lantus and consider the addition of meal time coverage (rapid-acting insulin) when Ramadan is over.   Hypertension: currently uncontrolled based on blood pressure reading of 154/64. Took last dose of amlodipine and losartan last night and is unable to get refills due to financial issues. Swelling of arms may be due to amlodipine, though it is typically seen in the lower extremities. Discontinue amlodipine and losartan. If amlodipine is ever restarted, consider starting at a lower dose as swelling is dose-related. Initiated hydrochlorothiazide 25 mg daily. Patient was given $4 to take to [facility] to pick up the medication. Follow up blood pressure at next visit.   Next A1C anticipated 10/2014.  Written patient instructions provided.  Follow up with Eusebio Friendly next for financial paperwork and possible transfer to [place]. Will send inbox message to Hunt Oris, social worker, for assistance with transportation and Medicaid coverage.  Total time in face to face counseling 30 minutes.  Patient seen with Nilsa Nutting, PharmD Candidate and Elenor Quinones, PharmD Resident and Nicoletta Ba, PharmD, [facility] resident.

## 2014-08-18 NOTE — Progress Notes (Signed)
Patient ID: Kimberly Lane, female   DOB: 1960-05-25, 54 y.o.   MRN: [id number] Reviewed: Agree with Dr. Graylin Shiver documentation and management.

## 2014-09-02 ENCOUNTER — Other Ambulatory Visit: Payer: Self-pay | Admitting: Student

## 2014-09-02 MED ORDER — INSULIN GLARGINE 100 UNIT/ML SOLOSTAR PEN: 25.0000 [IU] | PEN_INJECTOR | Freq: Every day | SUBCUTANEOUS | Status: DC

## 2014-09-02 NOTE — Telephone Encounter (Signed)
Lantus refilled. Please inform the patient.  Thank you  Alyssa A. Lincoln Brigham MD, [facility] Resident PGY-2 Pager [phone]

## 2014-09-02 NOTE — Telephone Encounter (Signed)
[facility] nurse: Pt needs insulin refilled--lantus Pt is out  [facility] [address]

## 2014-09-03 MED ORDER — INSULIN GLARGINE 100 UNIT/ML SOLOSTAR PEN: 25.0000 [IU] | PEN_INJECTOR | Freq: Every day | SUBCUTANEOUS | Status: DC

## 2014-09-03 NOTE — Telephone Encounter (Signed)
Script was supposed to be sent to pharmacy but kept coming up as a sample.  Reordered medication and sent to family.  Jazmin Hartsell,CMA

## 2014-09-03 NOTE — Addendum Note (Signed)
Addended by: Marina Goodell A on: 09/03/2014 12:13 PM   Modules accepted: Orders

## 2014-09-03 NOTE — Addendum Note (Signed)
Addended by: Valerie Roys on: 09/03/2014 12:18 PM   Modules accepted: Orders

## 2014-09-03 NOTE — Telephone Encounter (Signed)
Lantus resent.. Please inform the pt.   Thanks  Mikyah Alamo A. Lincoln Brigham MD, [facility] Resident PGY-2 Pager [phone]

## 2014-10-03 ENCOUNTER — Other Ambulatory Visit: Payer: Self-pay | Admitting: Student

## 2014-10-03 NOTE — Telephone Encounter (Signed)
Lucita Ferrara, RN with [facility] dept. Calling to verify pts meds and to get refills on oral meds, unsure of what pt is actually on for blood pressure, they are requesting to have it sent to the Outpatient pharmacy so the agency can pay for it.

## 2014-10-06 NOTE — Telephone Encounter (Signed)
2nd request for refill.  Please contact Lois with [facility] at [phone] when meds have been sent to [place].  They will pay for it thru [facility].

## 2014-10-07 MED ORDER — HYDROCHLOROTHIAZIDE 25 MG PO TABS: 25.0000 mg | ORAL_TABLET | Freq: Every day | ORAL | Status: DC

## 2014-10-07 MED ORDER — ATORVASTATIN CALCIUM 40 MG PO TABS: 40.0000 mg | ORAL_TABLET | Freq: Every day | ORAL | Status: DC

## 2014-10-07 NOTE — Telephone Encounter (Signed)
Refill request completed. Please inform pt  Thanks you  Tamirra Sienkiewicz A. Lincoln Brigham MD, [facility] Resident PGY-2 Pager [phone]

## 2014-10-23 ENCOUNTER — Ambulatory Visit (INDEPENDENT_AMBULATORY_CARE_PROVIDER_SITE_OTHER): Payer: Medicaid Other | Admitting: Student

## 2014-10-23 VITALS — BP 158/68 | HR 88 | Temp 98.1°F | Ht 60.0 in | Wt 134.0 lb

## 2014-10-23 DIAGNOSIS — E1165 Type 2 diabetes mellitus with hyperglycemia: Secondary | ICD-10-CM | POA: Diagnosis not present

## 2014-10-23 DIAGNOSIS — R300 Dysuria: Secondary | ICD-10-CM | POA: Diagnosis not present

## 2014-10-23 DIAGNOSIS — IMO0002 Reserved for concepts with insufficient information to code with codable children: Secondary | ICD-10-CM

## 2014-10-23 LAB — POCT URINALYSIS DIPSTICK
Bilirubin, UA: NEGATIVE
Nitrite, UA: NEGATIVE
Protein, UA: NEGATIVE
RBC UA: NEGATIVE
Spec Grav, UA: 1.02
UROBILINOGEN UA: 1
pH, UA: 7

## 2014-10-23 LAB — POCT GLYCOSYLATED HEMOGLOBIN (HGB A1C): Hemoglobin A1C: 12.1

## 2014-10-23 MED ORDER — CEPHALEXIN 500 MG PO CAPS: 500.0000 mg | ORAL_CAPSULE | Freq: Two times a day (BID) | ORAL | Status: DC

## 2014-10-23 MED ORDER — INSULIN ASPART 100 UNIT/ML FLEXPEN: 5.0000 [IU] | PEN_INJECTOR | Freq: Three times a day (TID) | SUBCUTANEOUS | Status: DC

## 2014-10-23 MED ORDER — INSULIN GLARGINE 100 UNIT/ML SOLOSTAR PEN: 30.0000 [IU] | PEN_INJECTOR | Freq: Every day | SUBCUTANEOUS | Status: DC

## 2014-10-23 NOTE — Patient Instructions (Signed)
Return in 3 months for A1c Take Lantus 30 units at night Take Novolog 5 units with meals Check blood sugars first thing in the AM and at night

## 2014-10-24 DIAGNOSIS — R300 Dysuria: Secondary | ICD-10-CM | POA: Insufficient documentation

## 2014-10-24 LAB — URINE CULTURE
Colony Count: NO GROWTH
Organism ID, Bacteria: NO GROWTH

## 2014-10-24 NOTE — Assessment & Plan Note (Signed)
Urine dipstick +. Will send for culture and prophylactically treat with Keflex. As pt wishes to have scripts printed, will print now.  - follow up urine culyures

## 2014-10-24 NOTE — Assessment & Plan Note (Signed)
A1c 12.1 today << 9.9 in 07/2014 - increase lantus to 30 qD - Start meal time novolog 5 units - pt advised to take blood sugars at night and fasting in te AM - Healthy diet discussed and pt encouraged to decrease carbohydrate intake. Pt reports she eats a lot of rice.

## 2014-10-24 NOTE — Progress Notes (Signed)
   Subjective:    Patient ID: Kimberly Lane, female    DOB: [birth year], 54 y.o.   MRN: [id number]   History obtained with interpreter present   CC: burning with urination, medication refill, DM check  HPI  54 y/o with h/o DM2 complicated by right AKA  Presents today for refill of insulin and DM check. Additionaly she is concern for dysuria and for the last week Today she denies chest pain, SOB, headache.    Review of Systems  See HPI for ROS.   Past medical history, surgical, family, and social history reviewed and updated in the EMR as appropriate.  Past Medical History  Diagnosis Date  . Diabetes mellitus   . Hypertension   . Cellulitis and abscess of foot 05/13/2014    rt foot  . Positive TB test 2013    Social History   Social History  . Marital Status: Married    Spouse Name: N/A  . Number of Children: N/A  . Years of Education: N/A   Occupational History  . Not on file.   Social History Main Topics  . Smoking status: Never Smoker   . Smokeless tobacco: Never Used  . Alcohol Use: No  . Drug Use: No  . Sexual Activity: Yes    Birth Control/ Protection: Post-menopausal   Other Topics Concern  . Not on file   Social History Narrative    Objective:  BP 158/68 mmHg  Pulse 88  Temp(Src) 98.1 F (36.7 C) (Oral)  Ht 5' (1.524 m)  Wt 134 lb (60.782 kg)  BMI 26.17 kg/m2 Vitals and nursing note reviewed  General: NAD Cardiac: RRR, normal heart sounds, no murmurs.  Respiratory: CTAB, normal effort Abdomen: soft, nontender, nondistended, no hepatic or splenomegaly. Bowel sounds present Extremities: no edema or cyanosis. WWP. Skin: warm and dry, no rashes noted Neuro: alert and oriented, no focal deficits   Assessment & Plan:  See Problem List     DM (diabetes mellitus), type 2, uncontrolled A1c 12.1 today << 9.9 in 07/2014 - increase lantus to 30 qD - Start meal time novolog 5 units - pt advised to take blood sugars at night and fasting in te  AM - Healthy diet discussed and pt encouraged to decrease carbohydrate intake. Pt reports she eats a lot of rice.  Dysuria Urine dipstick +. Will send for culture and prophylactically treat with Keflex. As pt wishes to have scripts printed, will print now.  - follow up urine culyures    Kimberly Lane A. Lincoln Brigham MD, [facility] Resident PGY-1 Pager [phone]

## 2014-10-29 ENCOUNTER — Other Ambulatory Visit: Payer: Self-pay | Admitting: Student

## 2014-10-30 NOTE — Telephone Encounter (Signed)
Patient out of diabetes medication.  Need this filled asap.  [facility] staff called to ask that the Toujeo be prescribed instead of the Lantus for financial reasons.

## 2014-10-31 NOTE — Telephone Encounter (Signed)
Lantus refilled  Kimberly Lane Brigham MD, [facility] Resident PGY-2 Pager [phone]

## 2014-12-18 ENCOUNTER — Telehealth: Payer: Self-pay | Admitting: *Deleted

## 2014-12-18 NOTE — Telephone Encounter (Signed)
Prior Authorization received from [employer] for [employer]. Formulary and PA form placed in provider box for completion. Derl Barrow, RN

## 2014-12-24 MED ORDER — INSULIN GLARGINE 100 UNIT/ML SOLOSTAR PEN: 25.0000 [IU] | PEN_INJECTOR | Freq: Every day | SUBCUTANEOUS | Status: DC

## 2014-12-24 NOTE — Telephone Encounter (Signed)
Spoke with pharmacy regarding [employer] Rx, while her insurance would not cover this, they will cover the Lantus Solostar pen. Will refill lantus pen at this time.   Kimberly Lane A. Lincoln Brigham MD, [facility] Resident PGY-2 Pager [phone]

## 2015-01-20 ENCOUNTER — Ambulatory Visit (INDEPENDENT_AMBULATORY_CARE_PROVIDER_SITE_OTHER): Payer: Medicaid Other | Admitting: Student

## 2015-01-20 ENCOUNTER — Encounter: Payer: Self-pay | Admitting: Student

## 2015-01-20 VITALS — BP 209/71 | HR 75

## 2015-01-20 DIAGNOSIS — E118 Type 2 diabetes mellitus with unspecified complications: Secondary | ICD-10-CM

## 2015-01-20 DIAGNOSIS — IMO0002 Reserved for concepts with insufficient information to code with codable children: Secondary | ICD-10-CM

## 2015-01-20 DIAGNOSIS — I10 Essential (primary) hypertension: Secondary | ICD-10-CM | POA: Diagnosis not present

## 2015-01-20 DIAGNOSIS — E1165 Type 2 diabetes mellitus with hyperglycemia: Secondary | ICD-10-CM

## 2015-01-20 LAB — POCT GLYCOSYLATED HEMOGLOBIN (HGB A1C): HEMOGLOBIN A1C: 11

## 2015-01-20 MED ORDER — ACCU-CHEK NANO SMARTVIEW W/DEVICE KIT: 1.0000 | PACK | Freq: Once | Status: DC

## 2015-01-20 MED ORDER — INSULIN GLARGINE 100 UNIT/ML SOLOSTAR PEN: 25.0000 [IU] | PEN_INJECTOR | Freq: Every day | SUBCUTANEOUS | Status: DC

## 2015-01-20 MED ORDER — ASPIRIN 81 MG PO TABS: 81.0000 mg | ORAL_TABLET | Freq: Every day | ORAL | Status: DC

## 2015-01-20 MED ORDER — GLUCOSE BLOOD VI STRP: ORAL_STRIP | Status: DC

## 2015-01-20 MED ORDER — INSULIN ASPART 100 UNIT/ML FLEXPEN: 5.0000 [IU] | PEN_INJECTOR | Freq: Three times a day (TID) | SUBCUTANEOUS | Status: DC

## 2015-01-20 MED ORDER — HYDROCHLOROTHIAZIDE 25 MG PO TABS: 25.0000 mg | ORAL_TABLET | Freq: Every day | ORAL | Status: DC

## 2015-01-20 MED ORDER — ATORVASTATIN CALCIUM 40 MG PO TABS: 40.0000 mg | ORAL_TABLET | Freq: Every day | ORAL | Status: DC

## 2015-01-20 NOTE — Patient Instructions (Signed)
Follow up in 3 months You were seen for DM check Please take insulin and blood pressure medication as prescribed  A new glucose meter and testing strips were prescribed for you If you have questions or concerns, please call the office at [phone]

## 2015-01-21 DIAGNOSIS — I10 Essential (primary) hypertension: Secondary | ICD-10-CM | POA: Insufficient documentation

## 2015-01-21 NOTE — Assessment & Plan Note (Signed)
While pt has devices to aid with mobility ( wheel chair, crutches, walker) she lives in a two story home with need to access the second floor bathroom. Currently crawls upstairs. No evidence of tissue injury or laceration over lower extremities - Will refer to ortho for discussion of prosthesis - discussed need to not crawl given her risk for obtaining non healing ulcerations - she agreed to obtain help with getting upstairs as much as possible

## 2015-01-21 NOTE — Assessment & Plan Note (Signed)
Currently on Hydrochlorothiazide 25 but has not been compliant with this for at least the last two weeks - Pt strongly encouraged to stay compliant with BP regimen - continue to follow, recheck at next visit - previously norvasc and losartan dc/ed, will consider restarting if blood pressure still elevated

## 2015-01-21 NOTE — Progress Notes (Signed)
Subjective:    Patient ID: Kimberly Lane, female    DOB: Jul 06, 1960, 54 y.o.   MRN: [id number]   [place] speaking friend of the family interpreted as the interpreting video as well as phone interpreter were unable to be accessed. The office staff waited for >20 mins for the interpreter  CC: Diabetes check, desire for prosthetic leg  HPI 54 y/o refugee with poorly controlled DM s/p right BKA comes to discuss leg prosthesis and for DM check  DM - Pt ran out of testing supplies as well as insulin 2 weeks ago and is unable to say what her blood sugars have been running on previous check - reports occasional numbness and tingling on hand nad left foot - She denies chest pain, SOB, recent illness, fevers/chills  Desire for Leg prosthesis - She feels that she unable to navigate arounf her apartment with her wheel chair, walker and crutches all of which she states that she has - Her home additinally has two levels with the restroom on the second floor and she has difficulty going upstairs - her husband has been carrying her upstairs but he has recently started working and has been out of the house for much of the day - she has started crawling up the stairs to get to the restroom - she denies any cuts or abrasions to her legs or left foot, but feels that her left knee is sore from crawling on it - she has not been wearing protective foot wear in the house as this is against her cultural norms - she is trying to obtain a new single story house but there is some concern about needing to stay near her brother in law, who is nearby, and desire to not move away from where she is now  HTN - pt ran out of medications two weeks ago - she denies headache, chest pain , SOB  Review of Systems   See HPI for ROS.   Past Medical History  Diagnosis Date  . Diabetes mellitus   . Hypertension   . Cellulitis and abscess of foot 05/13/2014    rt foot  . Positive TB test 2013   Past Surgical History    Procedure Laterality Date  . Abdominal aortagram N/A 05/20/2014    Procedure: ABDOMINAL Maxcine Ham;  Surgeon: Serafina Mitchell, MD;  Location: [facility] CATH LAB;  Service: Cardiovascular;  Laterality: N/A;  . Amputation Right 05/23/2014    Procedure: AMPUTATION BELOW KNEE;  Surgeon: Mal Misty, MD;  Location: [facility];  Service: Vascular;  Laterality: Right;    Social History   Social History  . Marital Status: Married    Spouse Name: N/A  . Number of Children: N/A  . Years of Education: N/A   Occupational History  . Not on file.   Social History Main Topics  . Smoking status: Never Smoker   . Smokeless tobacco: Never Used  . Alcohol Use: No  . Drug Use: No  . Sexual Activity: Yes    Birth Control/ Protection: Post-menopausal   Other Topics Concern  . Not on file   Social History Narrative    Objective:  BP 209/71 mmHg  Pulse 75 Vitals and nursing note reviewed BP obtained while pt was moving her arm, was not re checked prior to end of visit  General: NAD Cardiac: RRR, no murmurs noted Respiratory: CTAB, normal effort Extremities:Right BKA, no LE edema, Left foot+ PT pulse, weak DP pulse Skin: callous noted over left knee  with some tenderness to palpation, else warm and dry, no rashes noted Neuro: alert and oriented, no focal deficits   Assessment & Plan:    S/P BKA (below knee amputation) unilateral While pt has devices to aid with mobility ( wheel chair, crutches, walker) she lives in a two story home with need to access the second floor bathroom. Currently crawls upstairs. No evidence of tissue injury or laceration over lower extremities - Will refer to ortho for discussion of prosthesis - discussed need to not crawl given her risk for obtaining non healing ulcerations - she agreed to obtain help with getting upstairs as much as possible   DM (diabetes mellitus), type 2, uncontrolled A1c 11 today, discussed need to check her blood sugars as well as stay compliant  with insulin regimen. Very poor feeling in left foot, no current lesions but concern for developing them in setting of cultural aversion to wearing hard soled shoes in the house - insulin reordered and printed - new device and supplies ordered as well - foot check today 11/29 - strongly advised to wear protective foot wear as well as protection/padding over point of pressure ( left knee) if she continues to crawl - did not discuss eye appt today, will discuss at next visit   Essential hypertension Currently on Hydrochlorothiazide 25 but has not been compliant with this for at least the last two weeks - Pt strongly encouraged to stay compliant with BP regimen - continue to follow, recheck at next visit - previously norvasc and losartan dc/ed, will consider restarting if blood pressure still elevated     Sieanna Vanstone A. Lincoln Brigham MD, [facility] Resident PGY-2 Pager [phone]

## 2015-01-21 NOTE — Assessment & Plan Note (Signed)
A1c 11 today, discussed need to check her blood sugars as well as stay compliant with insulin regimen. Very poor feeling in left foot, no current lesions but concern for developing them in setting of cultural aversion to wearing hard soled shoes in the house - insulin reordered and printed - new device and supplies ordered as well - foot check today 11/29 - strongly advised to wear protective foot wear as well as protection/padding over point of pressure ( left knee) if she continues to crawl - did not discuss eye appt today, will discuss at next visit

## 2015-01-27 ENCOUNTER — Encounter: Payer: Self-pay | Admitting: *Deleted

## 2015-01-27 DIAGNOSIS — E1169 Type 2 diabetes mellitus with other specified complication: Secondary | ICD-10-CM

## 2015-01-30 ENCOUNTER — Telehealth: Payer: Self-pay | Admitting: Student

## 2015-01-30 NOTE — Telephone Encounter (Signed)
Pt needs her test strips called in. Please send to the [facility] on [address]

## 2015-02-02 ENCOUNTER — Telehealth: Payer: Self-pay | Admitting: Student

## 2015-02-02 MED ORDER — GLUCOSE BLOOD VI STRP: ORAL_STRIP | Status: DC

## 2015-02-02 NOTE — Telephone Encounter (Signed)
Testing strips to be phoned in

## 2015-02-02 NOTE — Telephone Encounter (Signed)
Testing strips refilled

## 2015-02-04 ENCOUNTER — Encounter: Payer: Self-pay | Admitting: *Deleted

## 2015-02-04 NOTE — Congregational Nurse Program (Signed)
Congregational Nurse Program Note  Date of Encounter: 01/27/2015  Past Medical History: No past medical history on file.  Encounter Details:      Amb Nursing Assessment - 02/04/15 1512    Nutrition Screen   Diabetes Yes   CBG done? Yes   CBG resulted in Enter/ Edit results? Yes  cbg  342 patient always has elvated BP/CBG   Did pt. bring in CBG monitor from home? No      From reports from previous nurses patient  Does not follow care management as ordered by her PCP. In visiting with her she states she takes her medications but when she eats and checks her blood sugar it is high her blood pressure medications  Also the same each visit i encourage  Watch her diet and take her medications. I will continue to visit and hopeful the CBG and BP will get more under control.Kimberly Lane also is seeing the patient concerning her mental health  Also on  Toujeo 26 units

## 2015-02-04 NOTE — Patient Instructions (Signed)
Patient instructed to take medication as ordered and shown how to do this husband was present on several visit and he understood the importance of medication also and eating habits

## 2015-02-05 ENCOUNTER — Telehealth: Payer: Self-pay | Admitting: Student

## 2015-02-05 DIAGNOSIS — IMO0002 Reserved for concepts with insufficient information to code with codable children: Secondary | ICD-10-CM

## 2015-02-05 DIAGNOSIS — E1165 Type 2 diabetes mellitus with hyperglycemia: Secondary | ICD-10-CM

## 2015-02-05 DIAGNOSIS — E1169 Type 2 diabetes mellitus with other specified complication: Principal | ICD-10-CM

## 2015-02-05 MED ORDER — GLUCOSE BLOOD VI STRP: ORAL_STRIP | Status: DC

## 2015-02-05 NOTE — Telephone Encounter (Signed)
Resent test strips to [facility].  Patient has a few pharmacies listed and will need to contact them and they can transfer the right medications for her. Jazmin Hartsell,CMA

## 2015-02-05 NOTE — Telephone Encounter (Signed)
Pt had a friend call us since she doesn't speak english. She needs [place] to transfer prescriptions to [facility] on [address]. We sent in some test strips on 02/02/15. jw

## 2015-02-06 ENCOUNTER — Telehealth: Payer: Self-pay | Admitting: Student

## 2015-02-06 NOTE — Telephone Encounter (Signed)
Requesting refill on insuling, [facility]/[address]

## 2015-02-08 MED ORDER — INSULIN GLARGINE 100 UNIT/ML SOLOSTAR PEN: 25.0000 [IU] | PEN_INJECTOR | Freq: Every day | SUBCUTANEOUS | Status: DC

## 2015-02-08 NOTE — Telephone Encounter (Signed)
Insulin refilled.

## 2015-02-09 ENCOUNTER — Other Ambulatory Visit: Payer: Self-pay

## 2015-02-09 NOTE — Telephone Encounter (Signed)
Kimberly Lane needs her needles so she can check her blood sugar  [facility] - [name]

## 2015-02-10 MED ORDER — ACCU-CHEK SOFT TOUCH LANCETS MISC: Status: DC

## 2015-02-10 NOTE — Telephone Encounter (Signed)
Lancets refilled 

## 2015-02-10 NOTE — Telephone Encounter (Signed)
Pt called and needs a refill on her lancets called in to the [facility] on [address] jw

## 2015-02-25 ENCOUNTER — Other Ambulatory Visit: Payer: Self-pay | Admitting: *Deleted

## 2015-02-25 ENCOUNTER — Telehealth: Payer: Self-pay | Admitting: Student

## 2015-02-25 MED ORDER — HYDROCHLOROTHIAZIDE 25 MG PO TABS: 25.0000 mg | ORAL_TABLET | Freq: Every day | ORAL | Status: DC

## 2015-02-25 MED ORDER — INSULIN ASPART 100 UNIT/ML FLEXPEN: 5.0000 [IU] | PEN_INJECTOR | Freq: Three times a day (TID) | SUBCUTANEOUS | Status: DC

## 2015-02-25 MED ORDER — INSULIN GLARGINE 100 UNIT/ML ~~LOC~~ SOLN: 25.0000 [IU] | Freq: Every day | SUBCUTANEOUS | Status: DC

## 2015-02-25 MED ORDER — ATORVASTATIN CALCIUM 40 MG PO TABS: 40.0000 mg | ORAL_TABLET | Freq: Every day | ORAL | Status: DC

## 2015-02-25 NOTE — Telephone Encounter (Signed)
Requested refills ordered

## 2015-02-25 NOTE — Telephone Encounter (Signed)
Patient needs Rx for Lantus and Novolog pen needles.  Will route request to PCP.  Burna Forts, BSN, RN-BC

## 2015-02-25 NOTE — Telephone Encounter (Signed)
Pt needs a refill on the following medications   Hydrochlorothiazide Atorvastatin insulin aspart (NOVOLOG) 100 UNIT Insulin Glargine (LANTUS SOLOSTAR) 100 UNIT/ML Solostar Pen  And Needles for pens.   Please send all medications to [facility] on [address]. Thank you, Fonda Kinder, ASA

## 2015-02-26 NOTE — Telephone Encounter (Signed)
Received message from [facility] stating they did not receive any orders/Rx for the pen needles for the Lantus SoloStar or Novolog Flex Pen.  Please advise.  Derl Barrow, RN

## 2015-02-26 NOTE — Telephone Encounter (Signed)
Needles for lantus and novolog pens called into pharmacy

## 2015-02-27 ENCOUNTER — Telehealth: Payer: Self-pay | Admitting: Student

## 2015-02-27 MED ORDER — INSULIN GLARGINE 100 UNIT/ML SOLOSTAR PEN: 25.0000 [IU] | PEN_INJECTOR | Freq: Every day | SUBCUTANEOUS | Status: DC

## 2015-02-27 MED ORDER — INSULIN ASPART 100 UNIT/ML FLEXPEN: 5.0000 [IU] | PEN_INJECTOR | Freq: Three times a day (TID) | SUBCUTANEOUS | Status: DC

## 2015-02-27 MED ORDER — ATORVASTATIN CALCIUM 40 MG PO TABS: 40.0000 mg | ORAL_TABLET | Freq: Every day | ORAL | Status: DC

## 2015-02-27 MED ORDER — HYDROCHLOROTHIAZIDE 25 MG PO TABS: 25.0000 mg | ORAL_TABLET | Freq: Every day | ORAL | Status: DC

## 2015-03-02 ENCOUNTER — Ambulatory Visit: Payer: Medicaid Other | Admitting: Physical Therapy

## 2015-03-03 ENCOUNTER — Encounter: Payer: Self-pay | Admitting: *Deleted

## 2015-03-03 DIAGNOSIS — Z794 Long term (current) use of insulin: Principal | ICD-10-CM

## 2015-03-03 DIAGNOSIS — Z719 Counseling, unspecified: Secondary | ICD-10-CM

## 2015-03-03 DIAGNOSIS — E1159 Type 2 diabetes mellitus with other circulatory complications: Secondary | ICD-10-CM

## 2015-03-03 LAB — GLUCOSE, POCT (MANUAL RESULT ENTRY): POC GLUCOSE: 456 mg/dL — AB (ref 70–99)

## 2015-03-03 NOTE — Telephone Encounter (Signed)
Requested refills ordered

## 2015-03-03 NOTE — Congregational Nurse Program (Signed)
Congregational Nurse Program Note  Date of Encounter: 03/03/2015  Past Medical History: Past Medical History  Diagnosis Date  . Anemia   . Allergy   . Anxiety     Encounter Details:     CNP Questionnaire - 03/03/15 1817    Patient Demographics   Is this a new or existing patient? Existing   Patient is considered a/an Refugee   Race African   Patient Assistance   Location of Patient [place]   Patient's financial/insurance status Medicaid   Uninsured Patient No   Patient referred to apply for the following financial assistance Not Applicable   Food insecurities addressed Not Applicable   Transportation assistance No   Assistance securing medications Yes   Educational health offerings Health literacy;Medications;Diabetes   Encounter Details   Primary purpose of visit Education/Health Concerns;Chronic Illness/Condition Visit   Was an Emergency Department visit averted? Not Applicable   Does patient have a medical provider? Yes   Patient referred to Not Applicable   Was a mental health screening completed? (GAINS tool) No   Does patient have dental issues? No   Since previous encounter, have you referred patient for abnormal blood pressure that resulted in a new diagnosis or medication change? No   Since previous encounter, have you referred patient for abnormal blood glucose that resulted in a new diagnosis or medication change? No       Patient was not happy today. She thinks that I should do as the other nurse did in getting everything she needs and bring it to her. I explained to her with [place] explaining to her that i could not buy her medications and that she now has insurance that will cover what she needs. I am not sure how much she understands this . I did go over her medications and her insulin . She allowed me to do a CBG but not BP. She says it was going to be high and also that her blood sugar would be high. This clients BP and Sugar is never with  in range always abnormal and this is something that has been continuous from reading the previous notes from the nurse before me. Will  Continue to visit and hope to help client get this under control. She gave me a big hug as  I left so there is still some relations there. Annamaria Boots had already given patient want she needed and my thanks to her.

## 2015-03-10 ENCOUNTER — Encounter: Payer: Self-pay | Admitting: *Deleted

## 2015-03-10 DIAGNOSIS — E1169 Type 2 diabetes mellitus with other specified complication: Secondary | ICD-10-CM

## 2015-03-10 LAB — GLUCOSE, POCT (MANUAL RESULT ENTRY): POC GLUCOSE: 267 mg/dL — AB (ref 70–99)

## 2015-03-10 NOTE — Congregational Nurse Program (Signed)
Congregational Nurse Program Note  Date of Encounter: 03/03/2015  Past Medical History: Past Medical History  Diagnosis Date  . Anemia   . Allergy   . Anxiety     Encounter Details:     CNP Questionnaire - 03/03/15 1844    Patient Demographics   Is this a new or existing patient? Existing   Patient is considered a/an Refugee   Race African   Patient Assistance   Location of Patient [place]   Patient's financial/insurance status Medicaid   Uninsured Patient No   Patient referred to apply for the following financial assistance Not Applicable   Food insecurities addressed Not Applicable   Encounter Details   Primary purpose of visit Education/Health Concerns;Chronic Illness/Condition Visit   Was an Emergency Department visit averted? Not Applicable   Does patient have a medical provider? Yes   Patient referred to Not Applicable   Was a mental health screening completed? (GAINS tool) No   Since previous encounter, have you referred patient for abnormal blood pressure that resulted in a new diagnosis or medication change? No       03/10/15 Had conversation with Social Worker about patients medication and  Concerns about high CBG. Patient now has all medications and equipment that she needs.She has a Dr. Network engineer on Thursday 19, 2017

## 2015-03-10 NOTE — Congregational Nurse Program (Signed)
Congregational Nurse Program Note  Date of Encounter: 03/10/2015  Past Medical History: Past Medical History  Diagnosis Date  . Anemia   . Allergy   . Anxiety     Encounter Details:     CNP Questionnaire - 03/06/15 1109    Patient Demographics   Is this a new or existing patient? Existing   Patient is considered a/an Refugee   Race African   Patient Assistance   Location of Patient [place]   Uninsured Patient No   Patient referred to apply for the following financial assistance Not Applicable   Food insecurities addressed Not Applicable   Transportation assistance No   Assistance securing medications Yes   Educational health offerings Health literacy;Medications;Diabetes   Encounter Details   Primary purpose of visit Education/Health Concerns;Chronic Illness/Condition Visit   Was an Emergency Department visit averted? Not Applicable   Does patient have a medical provider? Yes   Patient referred to Not Applicable   Was a mental health screening completed? (GAINS tool) No   Does patient have dental issues? No   Since previous encounter, have you referred patient for abnormal blood pressure that resulted in a new diagnosis or medication change? No   Since previous encounter, have you referred patient for abnormal blood glucose that resulted in a new diagnosis or medication change? No       03/10/15/Client is in much better spirits today .Her CBG was 267 and client is taking her on CBG's and keeping  Up with it in her booklet. Taking her medication as prescribed by MD for ger BP.Marland Kitchen

## 2015-03-14 DIAGNOSIS — E1159 Type 2 diabetes mellitus with other circulatory complications: Secondary | ICD-10-CM

## 2015-03-14 DIAGNOSIS — E1169 Type 2 diabetes mellitus with other specified complication: Secondary | ICD-10-CM

## 2015-03-14 DIAGNOSIS — Z719 Counseling, unspecified: Secondary | ICD-10-CM

## 2015-03-14 DIAGNOSIS — Z794 Long term (current) use of insulin: Secondary | ICD-10-CM

## 2015-03-14 NOTE — Congregational Nurse Program (Signed)
Congregational Nurse Program Note  Date of Encounter: 03/14/2015  Past Medical History: Past Medical History  Diagnosis Date  . Anemia   . Allergy   . Anxiety     Encounter Details:     CNP Questionnaire - 03/10/15 1329    Patient Demographics   Is this a new or existing patient? Existing   Patient is considered a/an Refugee   Race African   Patient Assistance   Location of Patient [place]   Patient's financial/insurance status Medicaid   Uninsured Patient No   Patient referred to apply for the following financial assistance Not Applicable   Food insecurities addressed Not Applicable   Transportation assistance No   Assistance securing medications Yes   Educational health offerings Health literacy;Medications;Diabetes   Encounter Details   Primary purpose of visit Education/Health Concerns;Chronic Illness/Condition Visit   Was an Emergency Department visit averted? Not Applicable   Does patient have a medical provider? Yes   Patient referred to Not Applicable   Was a mental health screening completed? (GAINS tool) No   Does patient have dental issues? No   Since previous encounter, have you referred patient for abnormal blood pressure that resulted in a new diagnosis or medication change? No   Since previous encounter, have you referred patient for abnormal blood glucose that resulted in a new diagnosis or medication change? No   For Abstraction Use Only   Does patient have insurance? Yes         Amb Nursing Assessment - 03/14/15 1450    Pain Assessment   Pain Assessment No/denies pain   Nutrition Screen   Diabetes Yes   CBG done? Yes   CBG resulted in Enter/ Edit results? Yes  164 fasting   Functional Status   Activities of Daily Living (!) Needs Assist   Feeding Independent   Dressing/Grooming Independent   Bathing Needs assistance   Toileting Independent   Transfer Independent   Ambulation Needs Assistance   Medication Administration  Independent   Home Management Needs assistance (comment)   Risk/Barriers  Assessment   Barriers to Care Management & Learning Language;Literacy level   Psychosocial Barriers Financial need;Transportation   Patient Literacy   How often do you need to have someone help you when you read instructions, pamphlets, or other written materials from your doctor or pharmacy? 4 - Often   Investment banker, operational Needed? Yes   Interpreter Agency [employer]     The office at [facility] was closed and I was asked to assist patient with administration of medication and review of new medicaid approved glucometer.

## 2016-03-03 ENCOUNTER — Other Ambulatory Visit: Payer: Self-pay

## 2016-03-03 ENCOUNTER — Encounter: Payer: Self-pay | Admitting: Family

## 2016-03-03 ENCOUNTER — Ambulatory Visit (INDEPENDENT_AMBULATORY_CARE_PROVIDER_SITE_OTHER): Payer: Medicare (Managed Care) | Admitting: Family

## 2016-03-03 VITALS — BP 152/78 | HR 65 | Temp 98.6°F | Resp 14 | Ht 62.4 in | Wt 145.2 lb

## 2016-03-03 DIAGNOSIS — Z89511 Acquired absence of right leg below knee: Secondary | ICD-10-CM | POA: Diagnosis not present

## 2016-03-03 DIAGNOSIS — I96 Gangrene, not elsewhere classified: Secondary | ICD-10-CM | POA: Diagnosis not present

## 2016-03-03 DIAGNOSIS — E1151 Type 2 diabetes mellitus with diabetic peripheral angiopathy without gangrene: Secondary | ICD-10-CM | POA: Diagnosis not present

## 2016-03-03 DIAGNOSIS — R0989 Other specified symptoms and signs involving the circulatory and respiratory systems: Secondary | ICD-10-CM | POA: Diagnosis not present

## 2016-03-03 NOTE — Patient Instructions (Signed)

## 2016-03-03 NOTE — Progress Notes (Signed)
[facility] [place]   CC: Follow up peripheral artery occlusive disease  History of Present Illness Kimberly Lane is a 56 y.o. female who is s/p right BKA on 05-23-14 by Dr. Kellie Simmering for cellulitis of right foot; non-healing wound on right foot, gangrene right fifth toe with non-reconstructable tibial occlusive disease. She was last evaluated in our practice on 07-01-14 by Dr. Kellie Simmering. She was doing well and was to return prn.   She returns today at the request of Dr. Jimmye Norman with [facility] with report of ischemic left great toe. ABI of left LE today from [facility] only report is non compressible vessels.  Pt has an Fish farm manager with her and a nurse, Kimberly Spurr, RN from [employer], pt's home care coordinator.  Nurse Hassell Done indicates that pt has cultural and language barriers that seem to get in the way of her taking her insulin.   I spoke with Dr. Jimmye Norman from [facility], who is pt's PCP. Dr. Jimmye Norman stated that pt tried to commit suicide about 2 months ago by taking rat poison, indicating she is depressed, lonely, wants to go back to [place].  Dr. Jimmye Norman states she will admit pt to a nursing facility to better manage her DM, painful left great toe, improved diet as she states pt eats mostly rice, and hydrate her.  Nurse with pt related that pt crawls on her knees a great deal, and has developed painful left knee with fluid collection, but not inflamed.   Pt indicates severe pain in her left great toe started 03-02-16, started seeping s/s drainage yesterday.  Pt denies fever or chills. She has been taking Augmentin, prescribed on 02-29-16.  Pt denies any history of stroke, TIA, or MI.   Pt Diabetic: Yes, 11.0, on 01-20-15, last A1C result on file (review of records.) On records from [facility] dated today that came with pt: A1C>16.7 Pt smoker: non-smoker  Pt meds include: Statin :Yes Betablocker: No ASA: yes Other anticoagulants/antiplatelets: no  Past Medical History:  Diagnosis Date  .  Cellulitis and abscess of foot 05/13/2014   rt foot  . Diabetes mellitus   . Hypertension   . Positive TB test 2013    Social History Social History  Substance Use Topics  . Smoking status: Never Smoker  . Smokeless tobacco: Never Used  . Alcohol use No    Family History Family History  Problem Relation Age of Onset  . Heart disease      No family history    Past Surgical History:  Procedure Laterality Date  . ABDOMINAL AORTAGRAM N/A 05/20/2014   Procedure: ABDOMINAL Maxcine Ham;  Surgeon: Serafina Mitchell, MD;  Location: [facility] CATH LAB;  Service: Cardiovascular;  Laterality: N/A;  . AMPUTATION Right 05/23/2014   Procedure: AMPUTATION BELOW KNEE;  Surgeon: Mal Misty, MD;  Location: [facility];  Service: Vascular;  Laterality: Right;    Allergies  Allergen Reactions  . Bactrim [Sulfamethoxazole-Trimethoprim] Nausea And Vomiting    Pt and family verified that pt takes this medication  . Ace Inhibitors Cough    cough    Current Outpatient Prescriptions  Medication Sig Dispense Refill  . amLODipine-atorvastatin (CADUET) 10-40 MG tablet Take 1 tablet by mouth daily.    Marland Kitchen amoxicillin-clavulanate (AUGMENTIN) 500-125 MG tablet Take 1 tablet by mouth every 8 (eight) hours.    Marland Kitchen aspirin 81 MG tablet Take 1 tablet (81 mg total) by mouth daily. 100 tablet 0  . Cholecalciferol (VITAMIN D3) 50000 units CAPS Take 50,000 Units by  mouth daily.    . citalopram (CELEXA) 20 MG tablet Take 20 mg by mouth daily.    Marland Kitchen glipiZIDE (GLUCOTROL) 5 MG tablet Take 5 mg by mouth daily before breakfast.    . hydrochlorothiazide (HYDRODIURIL) 25 MG tablet Take 1 tablet (25 mg total) by mouth daily. 90 tablet 3  . metFORMIN (GLUCOPHAGE) 500 MG tablet Take 1,000 mg by mouth daily before breakfast.    . acetaminophen (TYLENOL) 500 MG tablet Take 1 tablet (500 mg total) by mouth every 6 (six) hours as needed. (Patient not taking: Reported on 03/03/2016) 30 tablet 0  . atorvastatin (LIPITOR) 40 MG tablet Take 1  tablet (40 mg total) by mouth daily. (Patient not taking: Reported on 03/03/2016) 30 tablet 5  . Blood Glucose Monitoring Suppl (ACCU-CHEK NANO SMARTVIEW) W/DEVICE KIT 1 Device by Does not apply route once. (Patient not taking: Reported on 03/03/2016) 1 kit 0  . cephALEXin (KEFLEX) 500 MG capsule Take 1 capsule (500 mg total) by mouth 2 (two) times daily. (Patient not taking: Reported on 03/03/2016) 14 capsule 0  . fluconazole (DIFLUCAN) 150 MG tablet Take 1 tablet (150 mg total) by mouth once. (Patient not taking: Reported on 03/03/2016) 1 tablet 5  . glucose blood (ACCU-CHEK SMARTVIEW) test strip Use as instructed (Patient not taking: Reported on 03/03/2016) 100 each 12  . insulin aspart (NOVOLOG) 100 UNIT/ML FlexPen Inject 5 Units into the skin 3 (three) times daily with meals. (Patient not taking: Reported on 03/03/2016) 15 mL 11  . Insulin Glargine (LANTUS SOLOSTAR) 100 UNIT/ML Solostar Pen Inject 30 Units into the skin daily at 10 pm. (Patient not taking: Reported on 03/03/2016) 1 pen 20  . Insulin Glargine (LANTUS SOLOSTAR) 100 UNIT/ML Solostar Pen Inject 25 Units into the skin daily at 10 pm. (Patient not taking: Reported on 03/03/2016) 1 pen 30  . insulin glargine (LANTUS) 100 UNIT/ML injection Inject 0.25 mLs (25 Units total) into the skin daily. (Patient not taking: Reported on 03/03/2016) 10 mL 11  . lactobacillus acidophilus (BACID) TABS tablet Take 2 tablets by mouth 3 (three) times daily. (Patient not taking: Reported on 03/03/2016) 90 tablet 0  . Lancets (ACCU-CHEK SOFT TOUCH) lancets Use as instructed (Patient not taking: Reported on 03/03/2016) 100 each 12  . OxyCODONE (OXYCONTIN) 10 mg T12A 12 hr tablet Take 1 tablet (10 mg total) by mouth every 12 (twelve) hours. (Patient not taking: Reported on 03/03/2016) 28 tablet 0  . polyethylene glycol (MIRALAX / GLYCOLAX) packet Take 17-34 g by mouth daily as needed for moderate constipation. (Patient not taking: Reported on 03/03/2016) 14 each 0  . TOUJEO  SOLOSTAR 300 UNIT/ML SOPN INJECT 25 UNITS below THE SKIN daily AS DIRECTED (Patient not taking: Reported on 03/03/2016) 4.5 mL 5   No current facility-administered medications for this visit.     ROS: See HPI for pertinent positives and negatives.   Physical Examination  Vitals:   03/03/16 1206 03/03/16 1208  BP: (!) 158/79 (!) 152/78  Pulse: 65 65  Resp: 14   Temp: 98.6 F (37 C)   SpO2: (!) 65%   Weight: 145 lb 3.2 oz (65.9 kg)   Height: 5' 2.4" (1.585 m)    Body mass index is 26.22 kg/m.  General: A&O x 3, WDWN, female. Gait: seated in w/c Eyes: Pupils are equal Pulmonary: Respirations are non labored, CTAB, good air movement in all fields Cardiac: regular rhythm, no detected murmur.         Carotid Bruits Right Left  Positive Negative  Aorta is not palpable. Radial pulses: 2+ palpable and =                           VASCULAR EXAM: Extremities with ischemic changes, with dry Gangrene at tip of left great toe, seeping scant amount s/s drainage at tip of left great toe. No other wounds or ulcers noted.                                                                                                           LE Pulses Right Left       FEMORAL  not palpable seated in w/c  not palpable seated in w/c        POPLITEAL  wearing prosthesis   not palpable        POSTERIOR TIBIAL  BKA  Brisk Doppler signal        DORSALIS PEDIS      ANTERIOR TIBIAL  BKA  Brisk Doppler signal        PERONEAL BKA   Brisk Doppler signal    Abdomen: soft, NT, no masses. Skin: no rashes, see Extremities  Musculoskeletal: no muscle wasting or atrophy. Right BKA with prosthetic in place. Fluid noted and palpated in left knee, no signs of inflammation.   Neurologic: A&O X 3; Appropriate Affect ; SENSATION: normal; MOTOR FUNCTION:  moving all extremities equally, motor strength 5/5 throughout. Speech is fluent/normal. CN 2-12 intact.    Non-Invasive Vascular Imaging: DATE: 03/03/2016,  result from [facility]: Left side: Non compressible   ASSESSMENT: Maizy Davanzo is a 56 y.o. female who presents with 3 day history of painful dry gangrene at tip of left great toe, seeping scant amount s/s drainage that pt states started yesterday.  Her DM is uncontrolled, she has cultural and language barriers that seem to inhibit her from taking her insulin. She is a refugee from [place].  [facility] manages her care.  Pt denies any known history of stroke, TIA, or heart disease.   Right carotid bruit present; will need a carotid Duplex at some point.   PLAN:  She is currently on Augmentin, will stop that and switch to Levaquin 500 mg daily x 10 days. Nurse with pt states that Dr. Jimmye Norman will write this prescription.   Based on the patient's vascular studies and examination, and after discussing with Dr. Oneida Alar, pt will be scheduled for arteriogram with bilateral run off, possible intervention, on 03-11-16 by Dr. Oneida Alar. Indication: gangrene of left great toe. Pt will need an Arabic interpretor the day of surgery.  I discussed in depth with the patient and the nurse with her the nature of atherosclerosis, and emphasized the importance of maximal medical management including strict control of blood pressure, blood glucose, and lipid levels, obtaining regular exercise, and continued cessation of smoking.  The patient is aware that without maximal medical management the underlying atherosclerotic disease process will progress, limiting the benefit of any interventions.  The patient was given information about PAD including signs, symptoms,  treatment, what symptoms should prompt the patient to seek immediate medical care, and risk reduction measures to take.  Clemon Chambers, RN, MSN, FNP-C [facility] Phone: [phone]  Clinic MD: Oneida Alar  03/03/16 1:41 PM

## 2016-03-11 ENCOUNTER — Encounter (HOSPITAL_COMMUNITY)
Admission: RE | Disposition: A | Discharge status: Home / Self Care | Payer: Self-pay | Source: Ambulatory Visit | Attending: Vascular Surgery

## 2016-03-11 ENCOUNTER — Observation Stay (HOSPITAL_COMMUNITY)
Admission: RE | Admit: 2016-03-11 | Discharge: 2016-03-12 | Disposition: A | Discharge status: Home / Self Care | Payer: Medicare (Managed Care) | Source: Ambulatory Visit | Attending: Vascular Surgery | Admitting: Vascular Surgery

## 2016-03-11 ENCOUNTER — Telehealth: Payer: Self-pay | Admitting: Vascular Surgery

## 2016-03-11 ENCOUNTER — Other Ambulatory Visit: Payer: Self-pay | Admitting: *Deleted

## 2016-03-11 DIAGNOSIS — I739 Peripheral vascular disease, unspecified: Secondary | ICD-10-CM | POA: Diagnosis present

## 2016-03-11 DIAGNOSIS — I10 Essential (primary) hypertension: Secondary | ICD-10-CM | POA: Diagnosis not present

## 2016-03-11 DIAGNOSIS — Z882 Allergy status to sulfonamides status: Secondary | ICD-10-CM | POA: Diagnosis not present

## 2016-03-11 DIAGNOSIS — I998 Other disorder of circulatory system: Secondary | ICD-10-CM | POA: Diagnosis not present

## 2016-03-11 DIAGNOSIS — Z7982 Long term (current) use of aspirin: Secondary | ICD-10-CM | POA: Diagnosis not present

## 2016-03-11 DIAGNOSIS — L97529 Non-pressure chronic ulcer of other part of left foot with unspecified severity: Secondary | ICD-10-CM | POA: Insufficient documentation

## 2016-03-11 DIAGNOSIS — E1152 Type 2 diabetes mellitus with diabetic peripheral angiopathy with gangrene: Secondary | ICD-10-CM | POA: Diagnosis not present

## 2016-03-11 DIAGNOSIS — I7092 Chronic total occlusion of artery of the extremities: Secondary | ICD-10-CM | POA: Insufficient documentation

## 2016-03-11 DIAGNOSIS — R7611 Nonspecific reaction to tuberculin skin test without active tuberculosis: Secondary | ICD-10-CM | POA: Diagnosis not present

## 2016-03-11 DIAGNOSIS — E7800 Pure hypercholesterolemia, unspecified: Secondary | ICD-10-CM | POA: Insufficient documentation

## 2016-03-11 DIAGNOSIS — E785 Hyperlipidemia, unspecified: Secondary | ICD-10-CM | POA: Diagnosis not present

## 2016-03-11 DIAGNOSIS — R0989 Other specified symptoms and signs involving the circulatory and respiratory systems: Secondary | ICD-10-CM | POA: Diagnosis not present

## 2016-03-11 DIAGNOSIS — E1165 Type 2 diabetes mellitus with hyperglycemia: Secondary | ICD-10-CM | POA: Diagnosis not present

## 2016-03-11 DIAGNOSIS — F329 Major depressive disorder, single episode, unspecified: Secondary | ICD-10-CM | POA: Diagnosis not present

## 2016-03-11 DIAGNOSIS — Z915 Personal history of self-harm: Secondary | ICD-10-CM | POA: Diagnosis not present

## 2016-03-11 DIAGNOSIS — Z89511 Acquired absence of right leg below knee: Secondary | ICD-10-CM | POA: Insufficient documentation

## 2016-03-11 DIAGNOSIS — Z01810 Encounter for preprocedural cardiovascular examination: Secondary | ICD-10-CM

## 2016-03-11 DIAGNOSIS — I70245 Atherosclerosis of native arteries of left leg with ulceration of other part of foot: Principal | ICD-10-CM | POA: Insufficient documentation

## 2016-03-11 DIAGNOSIS — I96 Gangrene, not elsewhere classified: Secondary | ICD-10-CM | POA: Diagnosis not present

## 2016-03-11 DIAGNOSIS — M549 Dorsalgia, unspecified: Secondary | ICD-10-CM | POA: Insufficient documentation

## 2016-03-11 DIAGNOSIS — E11621 Type 2 diabetes mellitus with foot ulcer: Secondary | ICD-10-CM | POA: Insufficient documentation

## 2016-03-11 DIAGNOSIS — M1612 Unilateral primary osteoarthritis, left hip: Secondary | ICD-10-CM | POA: Diagnosis not present

## 2016-03-11 DIAGNOSIS — Z794 Long term (current) use of insulin: Secondary | ICD-10-CM | POA: Insufficient documentation

## 2016-03-11 HISTORY — PX: PERIPHERAL VASCULAR CATHETERIZATION: SHX172C

## 2016-03-11 LAB — POCT I-STAT, CHEM 8
BUN: 10 mg/dL (ref 6–20)
CALCIUM ION: 1.14 mmol/L — AB (ref 1.15–1.40)
CREATININE: 0.6 mg/dL (ref 0.44–1.00)
Chloride: 99 mmol/L — ABNORMAL LOW (ref 101–111)
Glucose, Bld: 215 mg/dL — ABNORMAL HIGH (ref 65–99)
HCT: 38 % (ref 36.0–46.0)
Hemoglobin: 12.9 g/dL (ref 12.0–15.0)
Potassium: 2.8 mmol/L — ABNORMAL LOW (ref 3.5–5.1)
SODIUM: 142 mmol/L (ref 135–145)
TCO2: 33 mmol/L (ref 0–100)

## 2016-03-11 LAB — GLUCOSE, CAPILLARY
GLUCOSE-CAPILLARY: 227 mg/dL — AB (ref 65–99)
GLUCOSE-CAPILLARY: 266 mg/dL — AB (ref 65–99)
GLUCOSE-CAPILLARY: 275 mg/dL — AB (ref 65–99)
Glucose-Capillary: 245 mg/dL — ABNORMAL HIGH (ref 65–99)

## 2016-03-11 LAB — POCT ACTIVATED CLOTTING TIME
ACTIVATED CLOTTING TIME: 274 s
ACTIVATED CLOTTING TIME: 318 s
Activated Clotting Time: 202 seconds
Activated Clotting Time: 175 seconds

## 2016-03-11 SURGERY — ABDOMINAL AORTOGRAM W/LOWER EXTREMITY

## 2016-03-11 MED ORDER — ONDANSETRON HCL 4 MG/2ML IJ SOLN
4.0000 mg | Freq: Four times a day (QID) | INTRAMUSCULAR | Status: DC | PRN
  Administered 2016-03-11: 4 mg via INTRAVENOUS

## 2016-03-11 MED ORDER — CLOPIDOGREL BISULFATE 300 MG PO TABS
300.0000 mg | ORAL_TABLET | Freq: Once | ORAL | Status: AC
  Administered 2016-03-11: 300 mg via ORAL

## 2016-03-11 MED ORDER — HEPARIN SODIUM (PORCINE) 1000 UNIT/ML IJ SOLN
INTRAMUSCULAR | Status: AC
  Filled 2016-03-11: qty 1

## 2016-03-11 MED ORDER — PANTOPRAZOLE SODIUM 40 MG PO TBEC
40.0000 mg | DELAYED_RELEASE_TABLET | Freq: Every day | ORAL | Status: DC
  Administered 2016-03-11 – 2016-03-12 (×2): 40 mg via ORAL
  Filled 2016-03-11 (×2): qty 1

## 2016-03-11 MED ORDER — MORPHINE SULFATE (PF) 10 MG/ML IV SOLN
2.0000 mg | INTRAVENOUS | Status: DC | PRN
  Administered 2016-03-11 (×2): 2 mg via INTRAVENOUS

## 2016-03-11 MED ORDER — POLYETHYLENE GLYCOL 3350 17 G PO PACK: 17.0000 g | PACK | Freq: Every day | ORAL | Status: DC | PRN

## 2016-03-11 MED ORDER — HEPARIN (PORCINE) IN NACL 2-0.9 UNIT/ML-% IJ SOLN
INTRAMUSCULAR | Status: DC | PRN
  Administered 2016-03-11: 1000 mL via INTRA_ARTERIAL

## 2016-03-11 MED ORDER — CLOPIDOGREL BISULFATE 75 MG PO TABS
75.0000 mg | ORAL_TABLET | Freq: Every day | ORAL | Status: DC
  Administered 2016-03-12: 75 mg via ORAL
  Filled 2016-03-11: qty 1

## 2016-03-11 MED ORDER — OXYCODONE HCL 5 MG PO TABS: 5.0000 mg | ORAL_TABLET | ORAL | Status: DC | PRN

## 2016-03-11 MED ORDER — ACETAMINOPHEN 325 MG PO TABS: 325.0000 mg | ORAL_TABLET | ORAL | Status: DC | PRN

## 2016-03-11 MED ORDER — HEPARIN (PORCINE) IN NACL 2-0.9 UNIT/ML-% IJ SOLN
INTRAMUSCULAR | Status: AC
Start: 2016-03-11 — End: 2016-03-11
  Filled 2016-03-11: qty 500

## 2016-03-11 MED ORDER — SODIUM CHLORIDE 0.9 % IV SOLN
INTRAVENOUS | Status: DC
  Administered 2016-03-11: 08:00:00 via INTRAVENOUS

## 2016-03-11 MED ORDER — ONDANSETRON HCL 4 MG/2ML IJ SOLN
INTRAMUSCULAR | Status: AC
Start: 2016-03-11 — End: 2016-03-11
  Filled 2016-03-11: qty 2

## 2016-03-11 MED ORDER — POTASSIUM CHLORIDE CRYS ER 20 MEQ PO TBCR
EXTENDED_RELEASE_TABLET | ORAL | Status: AC
  Filled 2016-03-11: qty 2

## 2016-03-11 MED ORDER — HYDRALAZINE HCL 20 MG/ML IJ SOLN
INTRAMUSCULAR | Status: AC
  Filled 2016-03-11: qty 1

## 2016-03-11 MED ORDER — MAGNESIUM HYDROXIDE 400 MG/5ML PO SUSP: 30.0000 mL | Freq: Every day | ORAL | Status: DC | PRN

## 2016-03-11 MED ORDER — PHENOL 1.4 % MT LIQD
1.0000 | OROMUCOSAL | Status: DC | PRN
Start: 2016-03-11 — End: 2016-03-12
  Filled 2016-03-11: qty 177

## 2016-03-11 MED ORDER — ONDANSETRON HCL 4 MG/2ML IJ SOLN: 4.0000 mg | Freq: Once | INTRAMUSCULAR | Status: DC

## 2016-03-11 MED ORDER — INSULIN ASPART 100 UNIT/ML ~~LOC~~ SOLN
0.0000 [IU] | Freq: Three times a day (TID) | SUBCUTANEOUS | Status: DC
Start: 2016-03-11 — End: 2016-03-12
  Administered 2016-03-11: 5 [IU] via SUBCUTANEOUS
  Administered 2016-03-12: 7 [IU] via SUBCUTANEOUS
  Administered 2016-03-12: 5 [IU] via SUBCUTANEOUS
  Administered 2016-03-12: 1 [IU] via SUBCUTANEOUS

## 2016-03-11 MED ORDER — POTASSIUM CHLORIDE CRYS ER 20 MEQ PO TBCR
20.0000 meq | EXTENDED_RELEASE_TABLET | Freq: Once | ORAL | Status: AC
  Administered 2016-03-11: 40 meq via ORAL
  Filled 2016-03-11: qty 2

## 2016-03-11 MED ORDER — INSULIN ASPART 100 UNIT/ML ~~LOC~~ SOLN: 0.0000 [IU] | Freq: Three times a day (TID) | SUBCUTANEOUS | Status: DC

## 2016-03-11 MED ORDER — CITALOPRAM HYDROBROMIDE 20 MG PO TABS
20.0000 mg | ORAL_TABLET | Freq: Every day | ORAL | Status: DC
  Administered 2016-03-11 – 2016-03-12 (×2): 20 mg via ORAL
  Filled 2016-03-11 (×2): qty 1

## 2016-03-11 MED ORDER — HYDRALAZINE HCL 20 MG/ML IJ SOLN
INTRAMUSCULAR | Status: DC | PRN
  Administered 2016-03-11: 10 mg via INTRAVENOUS

## 2016-03-11 MED ORDER — LABETALOL HCL 5 MG/ML IV SOLN
10.0000 mg | INTRAVENOUS | Status: DC | PRN
  Administered 2016-03-12: 10 mg via INTRAVENOUS
  Filled 2016-03-11: qty 4

## 2016-03-11 MED ORDER — ONDANSETRON HCL 4 MG/2ML IJ SOLN
INTRAMUSCULAR | Status: DC | PRN
  Administered 2016-03-11: 4 mg via INTRAVENOUS

## 2016-03-11 MED ORDER — HYDRALAZINE HCL 20 MG/ML IJ SOLN: 5.0000 mg | INTRAMUSCULAR | Status: DC | PRN

## 2016-03-11 MED ORDER — CLOPIDOGREL BISULFATE 75 MG PO TABS: 75.0000 mg | ORAL_TABLET | Freq: Every day | ORAL | Status: DC

## 2016-03-11 MED ORDER — SODIUM CHLORIDE 0.45 % IV SOLN
INTRAVENOUS | Status: DC
  Administered 2016-03-11: 100 mL/h via INTRAVENOUS

## 2016-03-11 MED ORDER — LIDOCAINE HCL (PF) 1 % IJ SOLN
INTRAMUSCULAR | Status: AC
  Filled 2016-03-11: qty 30

## 2016-03-11 MED ORDER — MORPHINE SULFATE (PF) 4 MG/ML IV SOLN
INTRAVENOUS | Status: AC
  Filled 2016-03-11: qty 1

## 2016-03-11 MED ORDER — CLOPIDOGREL BISULFATE 300 MG PO TABS
ORAL_TABLET | ORAL | Status: AC
  Administered 2016-03-11: 300 mg via ORAL
  Filled 2016-03-11: qty 1

## 2016-03-11 MED ORDER — AMLODIPINE-ATORVASTATIN 10-40 MG PO TABS: 1.0000 | ORAL_TABLET | Freq: Every day | ORAL | Status: DC

## 2016-03-11 MED ORDER — MORPHINE SULFATE (PF) 2 MG/ML IV SOLN: 2.0000 mg | INTRAVENOUS | Status: DC | PRN

## 2016-03-11 MED ORDER — HEPARIN SODIUM (PORCINE) 1000 UNIT/ML IJ SOLN
INTRAMUSCULAR | Status: DC | PRN
  Administered 2016-03-11: 7000 [IU] via INTRAVENOUS

## 2016-03-11 MED ORDER — IODIXANOL 320 MG/ML IV SOLN
INTRAVENOUS | Status: DC | PRN
  Administered 2016-03-11: 165 mL via INTRA_ARTERIAL

## 2016-03-11 MED ORDER — ATORVASTATIN CALCIUM 40 MG PO TABS
40.0000 mg | ORAL_TABLET | Freq: Every day | ORAL | Status: DC
  Administered 2016-03-11 – 2016-03-12 (×2): 40 mg via ORAL
  Filled 2016-03-11 (×2): qty 1

## 2016-03-11 MED ORDER — BISACODYL 10 MG RE SUPP: 10.0000 mg | RECTAL | Status: DC | PRN

## 2016-03-11 MED ORDER — ONDANSETRON HCL 4 MG/2ML IJ SOLN
INTRAMUSCULAR | Status: AC
  Administered 2016-03-11: 4 mg via INTRAVENOUS
  Filled 2016-03-11: qty 2

## 2016-03-11 MED ORDER — FENTANYL CITRATE (PF) 100 MCG/2ML IJ SOLN
INTRAMUSCULAR | Status: AC
  Filled 2016-03-11: qty 2

## 2016-03-11 MED ORDER — ALUM & MAG HYDROXIDE-SIMETH 200-200-20 MG/5ML PO SUSP
15.0000 mL | ORAL | Status: DC | PRN
  Filled 2016-03-11: qty 30

## 2016-03-11 MED ORDER — METOPROLOL TARTRATE 5 MG/5ML IV SOLN: 2.0000 mg | INTRAVENOUS | Status: DC | PRN

## 2016-03-11 MED ORDER — LEVOFLOXACIN 500 MG PO TABS
500.0000 mg | ORAL_TABLET | ORAL | Status: DC
  Administered 2016-03-11 – 2016-03-12 (×2): 500 mg via ORAL
  Filled 2016-03-11 (×2): qty 1

## 2016-03-11 MED ORDER — GLIPIZIDE ER 5 MG PO TB24
5.0000 mg | ORAL_TABLET | Freq: Every day | ORAL | Status: DC
  Administered 2016-03-12: 5 mg via ORAL
  Filled 2016-03-11: qty 1

## 2016-03-11 MED ORDER — AMLODIPINE BESYLATE 10 MG PO TABS
10.0000 mg | ORAL_TABLET | Freq: Every day | ORAL | Status: DC
  Administered 2016-03-11 – 2016-03-12 (×2): 10 mg via ORAL
  Filled 2016-03-11 (×2): qty 1

## 2016-03-11 MED ORDER — LIDOCAINE HCL (PF) 1 % IJ SOLN
INTRAMUSCULAR | Status: DC | PRN
  Administered 2016-03-11: 10 mL via SUBCUTANEOUS

## 2016-03-11 MED ORDER — GUAIFENESIN-DM 100-10 MG/5ML PO SYRP
15.0000 mL | ORAL_SOLUTION | ORAL | Status: DC | PRN
  Filled 2016-03-11: qty 15

## 2016-03-11 MED ORDER — ACETAMINOPHEN 325 MG RE SUPP
325.0000 mg | RECTAL | Status: DC | PRN
  Filled 2016-03-11: qty 2

## 2016-03-11 MED ORDER — POTASSIUM CHLORIDE CRYS ER 20 MEQ PO TBCR
40.0000 meq | EXTENDED_RELEASE_TABLET | Freq: Once | ORAL | Status: AC
  Administered 2016-03-11: 40 meq via ORAL

## 2016-03-11 MED ORDER — ASPIRIN 81 MG PO CHEW
81.0000 mg | CHEWABLE_TABLET | Freq: Every day | ORAL | Status: DC
  Administered 2016-03-11 – 2016-03-12 (×2): 81 mg via ORAL
  Filled 2016-03-11 (×2): qty 1

## 2016-03-11 MED ORDER — HYDROCHLOROTHIAZIDE 25 MG PO TABS
25.0000 mg | ORAL_TABLET | Freq: Every day | ORAL | Status: DC
  Administered 2016-03-11 – 2016-03-12 (×2): 25 mg via ORAL
  Filled 2016-03-11 (×2): qty 1

## 2016-03-11 MED ORDER — FENTANYL CITRATE (PF) 100 MCG/2ML IJ SOLN
INTRAMUSCULAR | Status: DC | PRN
  Administered 2016-03-11: 25 ug via INTRAVENOUS

## 2016-03-11 MED ORDER — SODIUM CHLORIDE 0.9 % IV SOLN: 500.0000 mL | Freq: Once | INTRAVENOUS | Status: DC | PRN

## 2016-03-11 MED ORDER — OXYCODONE HCL 5 MG PO TABS: 2.5000 mg | ORAL_TABLET | ORAL | Status: DC | PRN

## 2016-03-11 SURGICAL SUPPLY — 20 items
BALLN IN.PACT DCB 6X150 (BALLOONS) ×3
BALLN MUSTANG 5X150X135 (BALLOONS) ×3
BALLOON MUSTANG 5X150X135 (BALLOONS) ×2 IMPLANT
CATH ANGIO 5F PIGTAIL 65CM (CATHETERS) ×3 IMPLANT
CATH CROSS OVER TEMPO 5F (CATHETERS) ×3 IMPLANT
CATH TEMPO AQUA 5F 100CM (CATHETERS) ×3 IMPLANT
COVER PRB 48X5XTLSCP FOLD TPE (BAG) ×2 IMPLANT
COVER PROBE 5X48 (BAG) ×1
DCB IN.PACT 6X150 (BALLOONS) ×2 IMPLANT
DEVICE TORQUE .025-.038 (MISCELLANEOUS) ×3 IMPLANT
GUIDEWIRE ANGLED .035X260CM (WIRE) ×3 IMPLANT
KIT ENCORE 26 ADVANTAGE (KITS) ×3 IMPLANT
KIT PV (KITS) ×3 IMPLANT
SHEATH PINNACLE 5F 10CM (SHEATH) ×3 IMPLANT
SHEATH PINNACLE ST 7F 45CM (SHEATH) ×3 IMPLANT
STENT INNOVA 6X120X130 (Permanent Stent) ×3 IMPLANT
SYR MEDRAD MARK V 150ML (SYRINGE) ×3 IMPLANT
TRANSDUCER W/STOPCOCK (MISCELLANEOUS) ×3 IMPLANT
TRAY PV CATH (CUSTOM PROCEDURE TRAY) ×3 IMPLANT
WIRE HITORQ VERSACORE ST 145CM (WIRE) ×6 IMPLANT

## 2016-03-11 NOTE — Progress Notes (Signed)
Pt reports relief from pain and nausea but reports nausea not subsided enough that she feels as if she could take Plavix at this time. All information obtained via [facility] interpreter, [facility], as pt speaks Arabic.

## 2016-03-11 NOTE — Progress Notes (Signed)
Anderson Malta o'neal rn/ cath lab aware of K+ 2.8, she will speak with Dr Oneida Alar and return call.

## 2016-03-11 NOTE — Progress Notes (Signed)
In and out cath performed, 352mL clear yellow urine.

## 2016-03-11 NOTE — Progress Notes (Signed)
Site area: Right femoral artery  Site Prior to Removal: level 0  Pressure Applied For 20 MINUTES   Minutes Beginning at 1305   Manual: Yes   Patient Status During Pull: WNL, without complaints  Post Pull Groin Site: level 0  Post Pull Instructions Given:yes, via interpreter at bedside. Verbalized understanding    Post Pull Pulses Present: yes, posterior knee   Dressing Applied:Yes, gauze and tegaderm.

## 2016-03-11 NOTE — Progress Notes (Signed)
Bedside report to be given in short stay.

## 2016-03-11 NOTE — Discharge Instructions (Signed)
°  HOLD METFORMIN - RESUME Monday    Angiogram, Care After These instructions give you information about caring for yourself after your procedure. Your doctor may also give you more specific instructions. Call your doctor if you have any problems or questions after your procedure. Follow these instructions at home:  Take medicines only as told by your doctor.  Follow your doctor's instructions about:  Care of the area where the tube was inserted.  Bandage (dressing) changes and removal.  You may shower 24-48 hours after the procedure or as told by your doctor.  Do not take baths, swim, or use a hot tub until your doctor approves.  Every day, check the area where the tube was inserted. Watch for:  Redness, swelling, or pain.  Fluid, blood, or pus.  Do not apply powder or lotion to the site.  Do not lift anything that is heavier than 10 lb (4.5 kg) for 5 days or as told by your doctor.  Ask your doctor when you can:  Return to work or school.  Do physical activities or play sports.  Have sex.  Do not drive or operate heavy machinery for 24 hours or as told by your doctor.  Have someone with you for the first 24 hours after the procedure.  Keep all follow-up visits as told by your doctor. This is important. Contact a health care provider if:  You have a fever.  You have chills.  You have more bleeding from the area where the tube was inserted. Hold pressure on the area.  You have redness, swelling, or pain in the area where the tube was inserted.  You have fluid or pus coming from the area. Get help right away if:  You have a lot of pain in the area where the tube was inserted.  The area where the tube was inserted is bleeding, and the bleeding does not stop after 30 minutes of holding steady pressure on the area.  The area near or just beyond the insertion site becomes pale, cool, tingly, or numb. This information is not intended to replace advice given to  you by your health care provider. Make sure you discuss any questions you have with your health care provider. Document Released: 05/06/2008 Document Revised: 07/16/2015 Document Reviewed: 07/11/2012 Elsevier Interactive Patient Education  2017 [employer].

## 2016-03-11 NOTE — Progress Notes (Signed)
Patient unable to void on bedpan, MD paged.

## 2016-03-11 NOTE — Interval H&P Note (Signed)
History and Physical Interval Note:  03/11/2016 9:07 AM  Kimberly Lane  has presented today for surgery, with the diagnosis of ascemic left great toe  The various methods of treatment have been discussed with the patient and family. After consideration of risks, benefits and other options for treatment, the patient has consented to  Procedure(s): Abdominal Aortogram w/Lower Extremity (N/A) as a surgical intervention .  The patient's history has been reviewed, patient examined, no change in status, stable for surgery.  I have reviewed the patient's chart and labs.  Questions were answered to the patient's satisfaction.     Ruta Hinds

## 2016-03-11 NOTE — Telephone Encounter (Signed)
-----   Message from Mena Goes, RN sent at 03/11/2016  1:53 PM EST ----- Regarding: schedule labs and appt 1 month   ----- Message ----- From: Elam Dutch, MD Sent: 03/11/2016  10:53 AM To: [facility] Charge Pool  Aortogram with left leg runoff  3rd order cath  Drug balloon PTA left SFA Rescue stent left SFA for dissection  Pt needs follow up appt in 1 month with bilat ABI and left leg duplex  She will go home on plavix and AsA  Ruta Hinds

## 2016-03-11 NOTE — H&P (View-Only) (Signed)
[facility] [place]   CC: Follow up peripheral artery occlusive disease  History of Present Illness Kimberly Lane is a 56 y.o. female who is s/p right BKA on 05-23-14 by Dr. Kellie Simmering for cellulitis of right foot; non-healing wound on right foot, gangrene right fifth toe with non-reconstructable tibial occlusive disease. She was last evaluated in our practice on 07-01-14 by Dr. Kellie Simmering. She was doing well and was to return prn.   She returns today at the request of Dr. Jimmye Norman with [facility] with report of ischemic left great toe. ABI of left LE today from [facility] only report is non compressible vessels.  Pt has an Fish farm manager with her and a Kimberly, Kimberly Spurr, RN from [employer], pt's home care coordinator.  Kimberly Lane indicates that pt has cultural and language barriers that seem to get in the way of her taking her insulin.   I spoke with Dr. Jimmye Norman from [facility], who is pt's PCP. Dr. Jimmye Norman stated that pt tried to commit suicide about 2 months ago by taking rat poison, indicating she is depressed, lonely, wants to go back to [place].  Dr. Jimmye Norman states she will admit pt to a nursing facility to better manage her DM, painful left great toe, improved diet as she states pt eats mostly rice, and hydrate her.  Kimberly with pt related that pt crawls on her knees a great deal, and has developed painful left knee with fluid collection, but not inflamed.   Pt indicates severe pain in her left great toe started 03-02-16, started seeping s/s drainage yesterday.  Pt denies fever or chills. She has been taking Augmentin, prescribed on 02-29-16.  Pt denies any history of stroke, TIA, or MI.   Pt Diabetic: Yes, 11.0, on 01-20-15, last A1C result on file (review of records.) On records from [facility] dated today that came with pt: A1C>16.7 Pt smoker: non-smoker  Pt meds include: Statin :Yes Betablocker: No ASA: yes Other anticoagulants/antiplatelets: no  Past Medical History:  Diagnosis Date  .  Cellulitis and abscess of foot 05/13/2014   rt foot  . Diabetes mellitus   . Hypertension   . Positive TB test 2013    Social History Social History  Substance Use Topics  . Smoking status: Never Smoker  . Smokeless tobacco: Never Used  . Alcohol use No    Family History Family History  Problem Relation Age of Onset  . Heart disease      No family history    Past Surgical History:  Procedure Laterality Date  . ABDOMINAL AORTAGRAM N/A 05/20/2014   Procedure: ABDOMINAL Maxcine Ham;  Surgeon: Serafina Mitchell, MD;  Location: [facility] CATH LAB;  Service: Cardiovascular;  Laterality: N/A;  . AMPUTATION Right 05/23/2014   Procedure: AMPUTATION BELOW KNEE;  Surgeon: Mal Misty, MD;  Location: [facility];  Service: Vascular;  Laterality: Right;    Allergies  Allergen Reactions  . Bactrim [Sulfamethoxazole-Trimethoprim] Nausea And Vomiting    Pt and family verified that pt takes this medication  . Ace Inhibitors Cough    cough    Current Outpatient Prescriptions  Medication Sig Dispense Refill  . amLODipine-atorvastatin (CADUET) 10-40 MG tablet Take 1 tablet by mouth daily.    Marland Kitchen amoxicillin-clavulanate (AUGMENTIN) 500-125 MG tablet Take 1 tablet by mouth every 8 (eight) hours.    Marland Kitchen aspirin 81 MG tablet Take 1 tablet (81 mg total) by mouth daily. 100 tablet 0  . Cholecalciferol (VITAMIN D3) 50000 units CAPS Take 50,000 Units by  mouth daily.    . citalopram (CELEXA) 20 MG tablet Take 20 mg by mouth daily.    Marland Kitchen glipiZIDE (GLUCOTROL) 5 MG tablet Take 5 mg by mouth daily before breakfast.    . hydrochlorothiazide (HYDRODIURIL) 25 MG tablet Take 1 tablet (25 mg total) by mouth daily. 90 tablet 3  . metFORMIN (GLUCOPHAGE) 500 MG tablet Take 1,000 mg by mouth daily before breakfast.    . acetaminophen (TYLENOL) 500 MG tablet Take 1 tablet (500 mg total) by mouth every 6 (six) hours as needed. (Patient not taking: Reported on 03/03/2016) 30 tablet 0  . atorvastatin (LIPITOR) 40 MG tablet Take 1  tablet (40 mg total) by mouth daily. (Patient not taking: Reported on 03/03/2016) 30 tablet 5  . Blood Glucose Monitoring Suppl (ACCU-CHEK NANO SMARTVIEW) W/DEVICE KIT 1 Device by Does not apply route once. (Patient not taking: Reported on 03/03/2016) 1 kit 0  . cephALEXin (KEFLEX) 500 MG capsule Take 1 capsule (500 mg total) by mouth 2 (two) times daily. (Patient not taking: Reported on 03/03/2016) 14 capsule 0  . fluconazole (DIFLUCAN) 150 MG tablet Take 1 tablet (150 mg total) by mouth once. (Patient not taking: Reported on 03/03/2016) 1 tablet 5  . glucose blood (ACCU-CHEK SMARTVIEW) test strip Use as instructed (Patient not taking: Reported on 03/03/2016) 100 each 12  . insulin aspart (NOVOLOG) 100 UNIT/ML FlexPen Inject 5 Units into the skin 3 (three) times daily with meals. (Patient not taking: Reported on 03/03/2016) 15 mL 11  . Insulin Glargine (LANTUS SOLOSTAR) 100 UNIT/ML Solostar Pen Inject 30 Units into the skin daily at 10 pm. (Patient not taking: Reported on 03/03/2016) 1 pen 20  . Insulin Glargine (LANTUS SOLOSTAR) 100 UNIT/ML Solostar Pen Inject 25 Units into the skin daily at 10 pm. (Patient not taking: Reported on 03/03/2016) 1 pen 30  . insulin glargine (LANTUS) 100 UNIT/ML injection Inject 0.25 mLs (25 Units total) into the skin daily. (Patient not taking: Reported on 03/03/2016) 10 mL 11  . lactobacillus acidophilus (BACID) TABS tablet Take 2 tablets by mouth 3 (three) times daily. (Patient not taking: Reported on 03/03/2016) 90 tablet 0  . Lancets (ACCU-CHEK SOFT TOUCH) lancets Use as instructed (Patient not taking: Reported on 03/03/2016) 100 each 12  . OxyCODONE (OXYCONTIN) 10 mg T12A 12 hr tablet Take 1 tablet (10 mg total) by mouth every 12 (twelve) hours. (Patient not taking: Reported on 03/03/2016) 28 tablet 0  . polyethylene glycol (MIRALAX / GLYCOLAX) packet Take 17-34 g by mouth daily as needed for moderate constipation. (Patient not taking: Reported on 03/03/2016) 14 each 0  . TOUJEO  SOLOSTAR 300 UNIT/ML SOPN INJECT 25 UNITS below THE SKIN daily AS DIRECTED (Patient not taking: Reported on 03/03/2016) 4.5 mL 5   No current facility-administered medications for this visit.     ROS: See HPI for pertinent positives and negatives.   Physical Examination  Vitals:   03/03/16 1206 03/03/16 1208  BP: (!) 158/79 (!) 152/78  Pulse: 65 65  Resp: 14   Temp: 98.6 F (37 C)   SpO2: (!) 65%   Weight: 145 lb 3.2 oz (65.9 kg)   Height: 5' 2.4" (1.585 m)    Body mass index is 26.22 kg/m.  General: A&O x 3, WDWN, female. Gait: seated in w/c Eyes: Pupils are equal Pulmonary: Respirations are non labored, CTAB, good air movement in all fields Cardiac: regular rhythm, no detected murmur.         Carotid Bruits Right Left  Positive Negative  Aorta is not palpable. Radial pulses: 2+ palpable and =                           VASCULAR EXAM: Extremities with ischemic changes, with dry Gangrene at tip of left great toe, seeping scant amount s/s drainage at tip of left great toe. No other wounds or ulcers noted.                                                                                                           LE Pulses Right Left       FEMORAL  not palpable seated in w/c  not palpable seated in w/c        POPLITEAL  wearing prosthesis   not palpable        POSTERIOR TIBIAL  BKA  Brisk Doppler signal        DORSALIS PEDIS      ANTERIOR TIBIAL  BKA  Brisk Doppler signal        PERONEAL BKA   Brisk Doppler signal    Abdomen: soft, NT, no masses. Skin: no rashes, see Extremities  Musculoskeletal: no muscle wasting or atrophy. Right BKA with prosthetic in place. Fluid noted and palpated in left knee, no signs of inflammation.   Neurologic: A&O X 3; Appropriate Affect ; SENSATION: normal; MOTOR FUNCTION:  moving all extremities equally, motor strength 5/5 throughout. Speech is fluent/normal. CN 2-12 intact.    Non-Invasive Vascular Imaging: DATE: 03/03/2016,  result from [facility]: Left side: Non compressible   ASSESSMENT: Maizy Davanzo is a 56 y.o. female who presents with 3 day history of painful dry gangrene at tip of left great toe, seeping scant amount s/s drainage that pt states started yesterday.  Her DM is uncontrolled, she has cultural and language barriers that seem to inhibit her from taking her insulin. She is a refugee from [place].  [facility] manages her care.  Pt denies any known history of stroke, TIA, or heart disease.   Right carotid bruit present; will need a carotid Duplex at some point.   PLAN:  She is currently on Augmentin, will stop that and switch to Levaquin 500 mg daily x 10 days. Kimberly with pt states that Dr. Jimmye Norman will write this prescription.   Based on the patient's vascular studies and examination, and after discussing with Dr. Oneida Alar, pt will be scheduled for arteriogram with bilateral run off, possible intervention, on 03-11-16 by Dr. Oneida Alar. Indication: gangrene of left great toe. Pt will need an Arabic interpretor the day of surgery.  I discussed in depth with the patient and the Kimberly with her the nature of atherosclerosis, and emphasized the importance of maximal medical management including strict control of blood pressure, blood glucose, and lipid levels, obtaining regular exercise, and continued cessation of smoking.  The patient is aware that without maximal medical management the underlying atherosclerotic disease process will progress, limiting the benefit of any interventions.  The patient was given information about PAD including signs, symptoms,  treatment, what symptoms should prompt the patient to seek immediate medical care, and risk reduction measures to take.  Clemon Chambers, RN, MSN, FNP-C [facility] Phone: [phone]  Clinic MD: Oneida Alar  03/03/16 1:41 PM

## 2016-03-11 NOTE — Op Note (Addendum)
Procedure: Abdominal aortogram with left lower extremity runoff, angioplasty and stenting of left superficial femoral artery (6 x 1 20)  Preoperative diagnosis: Nonhealing wound left foot.  Postoperative diagnosis: Same  Anesthesia: Local  Operative findings:1.  Diffusely diseased left superficial femoral artery with multiple segments greater than 90% stenosis angioplastied and stented to 0 residual percent stenosis   #2 chronic occlusion of anterior and posterior tibial artery with one-vessel runoff via a very diseased peroneal artery left leg  Operative details:  After obtaining informed consent, the patient was taken to the [place] lab. The patient was placed in supine position the Angio table. Irrigation was held with the patient through an interpreter regarding risks benefits possible complications and procedure details preoperatively. An interpreter was also present during the procedure. The patient was prepped and draped in usual sterile fashion. Local anesthesia was infiltrated over the right common femoral artery. Ultrasound was used to identify the right common femoral artery and an introducer needle used to cannulate the right common femoral artery without difficulty. An 035 versacore wire was threaded up in the abdominal aorta under fluoroscopic guidance. Next a 5 French sheath was placed over the guidewire in the right common femoral artery. This was thoroughly flushed with a heparinized saline. Next a 5 [place] Coude catheter was placed over the guidewire up in the abdominal aorta. An abdominal aortogram was obtained in AP projection. The left and right renal arteries are patent. The infrarenal abdominal aorta left common right common right external left external and bilateral internal iliac arteries are all widely patent. At this point lower extremity runoff views of the left leg were performed through the pigtail catheter. The left common femoral profunda femoris is widely patent. The left  superficial femoral artery is diffusely diseased with greater than 4 segments of greater than 90% stenosis. The below-knee popliteal artery is patent. The anterior tibial and posterior tibial arteries are chronically occluded. The peroneal artery is patent but diffusely diseased with multiple several centimeters segments of occlusion versus greater than 90% stenosis. At this point I decided that the peroneal artery was too severely diseased to consider an intervention for this. However, the superficial femoral artery did seem amenable to a percutaneous option so the set up was then made to treat the left superficial femoral artery. The patient was given 7000 units of intravenous heparin. ACT was greater than 300. A pectoral catheter was removed over the versacore wire. A 5 French crossover catheter was then used to selectively catheterize the left common iliac artery and a guidewire advanced down into the left superficial femoral artery. The crossover catheter was then removed and the 5 French sheath removed and a 7 [place] destination sheath was then advanced over the guidewire into the proximal left superficial femoral artery. Contrast angiogram was performed for roadmapping purposes. During the course of getting an 035 angled Glidewire down into the distal left superficial femoral artery and crossing the lesion it was noted that the guidewire had most likely perforated a portion of the popliteal artery and there was AV fistula communication. The guidewire was pulled back. At this point this sealed fairly quickly spontaneously. A 5 French straight catheter was advanced over the Glidewire into the popliteal artery. We confirmed that we were intraluminal with contrast opacification. The Glidewire was exchanged for an 035 versacore wire which was left parked in the distal popliteal artery. At this point a 5 x 1 50 Mustang angioplasty balloon was brought up and centered on the areas of narrowing  in the superficial  femoral artery. This was then inflated for 10 atm for 1 minute. This was done to prep for a drug occluded balloon. This point a 6 x 1 50 at oral drug occluded balloon was brought up on the operative field and advanced the lesion and centered and inflated to nominal pressure for 3 minutes. The patient did experience some pain and nausea during this portion of the case. The balloon was deflated after 3 minutes. Completion angiogram showed 3 discrete areas of dissection in the superficial femoral artery. I decided at this point the stent these to stabilize the flap in the dissected area. A 6 x 1 20 mm self-expanding stent was brought up and centered on the area of dissection. This was then deployed in usual fashion. The stent point system was removed and a 5 x 1 50 Mustang angioplasty balloon advanced to the stent and inflated to 10 atm for 30 seconds to fully expand the stent. Completion arteriogram showed no residual dissection wide patency of the stent and preserved one-vessel runoff via the peroneal artery. The patient our the procedure well up to this point. The balloon was removed over the wire and the sheath pulled back down into the right hemipelvis. The guidewire was then removed. The sheath was a place to be pulled in the holding area after the ACT is less than 175. The patient tolerated procedure well and there were no complications. The patient was taken to the holding area in stable condition.  The patient will be placed on plavix and aspirin  Ruta Hinds, MD [facility] [place] Office: [phone] Pager: [phone]

## 2016-03-11 NOTE — Progress Notes (Signed)
Report called to 2w19 to Mickel Baas.  Patient in stable condition at this time.

## 2016-03-11 NOTE — Telephone Encounter (Signed)
Sched lab 04/11/16 at 3:00 and MD 04/14/16 at 12:30. Lm on hm# to inform pt of appt.

## 2016-03-11 NOTE — Progress Notes (Signed)
Pt took Plavix 300 mg at 1500, no emesis noted. Pt's husband requested pt be admitted overnight, has been residing at [facility] for the past week. Called Dr. Oneida Alar, okay to stay overnight, will place admitting orders.  Pt now requesting food to eat, [place] sandwich given. [facility] also called to inform that pt to stay overnight. Report given to Larey Dresser, RN. Pt condition stable at this time.

## 2016-03-12 DIAGNOSIS — I70245 Atherosclerosis of native arteries of left leg with ulceration of other part of foot: Secondary | ICD-10-CM | POA: Diagnosis not present

## 2016-03-12 LAB — GLUCOSE, CAPILLARY
GLUCOSE-CAPILLARY: 145 mg/dL — AB (ref 65–99)
Glucose-Capillary: 296 mg/dL — ABNORMAL HIGH (ref 65–99)
Glucose-Capillary: 306 mg/dL — ABNORMAL HIGH (ref 65–99)

## 2016-03-12 MED ORDER — CLOPIDOGREL BISULFATE 75 MG PO TABS: 75.0000 mg | ORAL_TABLET | Freq: Every day | ORAL | 11 refills | Status: DC

## 2016-03-12 NOTE — Clinical Social Work Placement (Signed)
   CLINICAL SOCIAL WORK PLACEMENT  NOTE  Date:  03/12/2016  Patient Details  Name: Kimberly Lane MRN: [id number] Date of Birth: Jun 07, 1960  Clinical Social Work is seeking post-discharge placement for this patient at the [place] level of care (*CSW will initial, date and re-position this form in  chart as items are completed):  Yes   Patient/family provided with [place] Work Department's list of facilities offering this level of care within the geographic area requested by the patient (or if unable, by the patient's family).  Yes   Patient/family informed of their freedom to choose among providers that offer the needed level of care, that participate in Medicare, Medicaid or managed care program needed by the patient, have an available bed and are willing to accept the patient.  Yes   Patient/family informed of [facility]'s ownership interest in [facility] and [facility], as well as of the fact that they are under no obligation to receive care at these facilities.  PASRR submitted to EDS on       PASRR number received on       Existing PASRR number confirmed on 03/12/16     FL2 transmitted to all facilities in geographic area requested by pt/family on       FL2 transmitted to all facilities within larger geographic area on       Patient informed that his/her managed care company has contracts with or will negotiate with certain facilities, including the following:        Yes   Patient/family informed of bed offers received.  Patient chooses bed at       Physician recommends and patient chooses bed at      Patient to be transferred to  [facility]) on 03/12/16.  Patient to be transferred to facility by  [facility])     Patient family notified on 03/12/16 of transfer.  Name of family member notified:  Spouse     PHYSICIAN       Additional Comment:    _______________________________________________ Serafina Mitchell,  [place] 03/12/2016, 3:40 PM

## 2016-03-12 NOTE — Clinical Social Work Note (Signed)
Clinical Social Worker facilitated patient discharge including contacting patient family (spouse) and facility [facility]) to confirm patient discharge plans.  Clinical information faxed to facility and family agreeable with plan.CSW arranged ambulance transport via [facility] to. RN to call report prior to discharge.  Clinical Social Worker will sign off for now as social work intervention is no longer needed. Please consult [place] again if new need arises.  Meia Emley B. Joline Maxcy Clinical Social Work Dept Weekend Social Worker [phone] 2:56 PM

## 2016-03-12 NOTE — Care Management Note (Signed)
Case Management Note  Patient Details  Name: Kimberly Lane MRN: [id number] Date of Birth: July 30, 1960  Subjective/Objective:   Pt to be discharged to [facility] when medically ready for discharge. CM will defer disposition to CSW. CM will follow closely for disposition/discharge needs.                  Action/Plan:   Expected Discharge Date:  03/12/16               Expected Discharge Plan:  Skilled Nursing Facility  In-House Referral:  Clinical Social Work  Discharge planning Services  CM Consult  Post Acute Care Choice:  NA Choice offered to:     DME Arranged:    DME Agency:     HH Arranged:    [place] Agency:     Status of Service:  Completed, signed off  If discussed at [employer], dates discussed:    Additional Comments:  Delrae Sawyers, RN 03/12/2016, 2:49 PM

## 2016-03-12 NOTE — Progress Notes (Signed)
  Progress Note    03/12/2016 11:48 AM 1 Day Post-Op  Subjective:  No complaints  Tm 99.7 now afebrile VSS  Vitals:   03/12/16 0504 03/12/16 0908  BP: (!) 174/64 (!) 135/50  Pulse: 84   Resp: 18   Temp: 99.7 F (37.6 C) 98.8 F (37.1 C)    Physical Exam: Incisions:  Right groin without hematoma Extremities:  Left foot with dry gangrene left great toe  CBC    Component Value Date/Time   WBC 10.7 (H) 05/27/2014 0855   RBC 3.59 (L) 05/27/2014 0855   HGB 12.9 03/11/2016 0818   HCT 38.0 03/11/2016 0818   PLT 436 (H) 05/27/2014 0855   MCV 79.4 05/27/2014 0855   MCH 26.5 05/27/2014 0855   MCHC 33.3 05/27/2014 0855   RDW 13.2 05/27/2014 0855   LYMPHSABS 2.8 05/27/2014 0855   MONOABS 0.7 05/27/2014 0855   EOSABS 0.1 05/27/2014 0855   BASOSABS 0.1 05/27/2014 0855    BMET    Component Value Date/Time   NA 142 03/11/2016 0818   K 2.8 (L) 03/11/2016 0818   CL 99 (L) 03/11/2016 0818   CO2 32 05/27/2014 0855   GLUCOSE 215 (H) 03/11/2016 0818   BUN 10 03/11/2016 0818   CREATININE 0.60 03/11/2016 0818   CREATININE 0.58 12/18/2013 1515   CALCIUM 8.9 05/27/2014 0855   GFRNONAA >90 05/27/2014 0855   GFRAA >90 05/27/2014 0855    INR No results found for: INR   Intake/Output Summary (Last 24 hours) at 03/12/16 1148 Last data filed at 03/11/16 1415  Gross per 24 hour  Intake              120 ml  Output              350 ml  Net             -230 ml     Assessment:  56 y.o. female is s/p:  Abdominal aortogram with left lower extremity runoff, angioplasty and stenting of left superficial femoral artery (6 x 1 20).  1 Day Post-Op  Plan: -pt's right groin without hematoma -dc back to [facility] today -Continue Levaquin  -f/u with Dr. Oneida Alar in 3 weeks with ABI's, arterial duplex and carotid duplex    Leontine Locket, PA-C [facility] [phone] 03/12/2016 11:48 AM

## 2016-03-12 NOTE — Discharge Summary (Addendum)
Discharge Summary    Kimberly Lane 06-11-1960 56 y.o. female  [id number]  Admission Date: 03/11/2016  Discharge Date: 03/12/16  Physician: Elam Dutch, MD  Admission Diagnosis: ascemic left great toe   HPI:   This is a 56 y.o. female who is s/p right BKA on 05-23-14 by Dr. Kellie Simmering for cellulitis of right foot; non-healing wound on right foot, gangrene right fifth toe with non-reconstructable tibial occlusive disease. She was last evaluated in our practice on 07-01-14 by Dr. Kellie Simmering. She was doing well and was to return prn.   She returns today at the request of Dr. Jimmye Norman with [facility] with report of ischemic left great toe. ABI of left LE today from [facility] only report is non compressible vessels.  Pt has an Fish farm manager with her and a nurse, Verne Spurr, RN from [employer], pt's home care coordinator.  Nurse Hassell Done indicates that pt has cultural and language barriers that seem to get in the way of her taking her insulin.   I spoke with Dr. Jimmye Norman from [place], who is pt's PCP. Dr. Jimmye Norman stated that pt tried to commit suicide about 2 months ago by taking rat poison, indicating she is depressed, lonely, wants to go back to [place].  Dr. Jimmye Norman states she will admit pt to a nursing facility to better manage her DM, painful left great toe, improved diet as she states pt eats mostly rice, and hydrate her.  Nurse with pt related that pt crawls on her knees a great deal, and has developed painful left knee with fluid collection, but not inflamed.   Pt indicates severe pain in her left great toe started 03-02-16, started seeping s/s drainage yesterday.  Pt denies fever or chills. She has been taking Augmentin, prescribed on 02-29-16.  Pt denies any history of stroke, TIA, or MI.   Hospital Course:  The patient was admitted to the hospital and taken to the [facility] lab on 03/11/2016 and underwent: Abdominal aortogram with left lower extremity runoff, angioplasty and stenting of left superficial  femoral artery (6 x 1 20).  Operative findings are as follows: 1.  Diffusely diseased left superficial femoral artery with multiple segments greater than 90% stenosis angioplastied and stented to 0 residual percent stenosis   #2 chronic occlusion of anterior and posterior tibial artery with one-vessel runoff via a very diseased peroneal artery left leg  The pt tolerated the procedure well and was transported to the PACU in good condition.   She was admitted overnight for observation.  By the next morning, her right groin did not have a hematoma and she is discharged back to [facility].  The remainder of the hospital course consisted of increasing mobilization and increasing intake of solids without difficulty.  CBC    Component Value Date/Time   WBC 10.7 (H) 05/27/2014 0855   RBC 3.59 (L) 05/27/2014 0855   HGB 12.9 03/11/2016 0818   HCT 38.0 03/11/2016 0818   PLT 436 (H) 05/27/2014 0855   MCV 79.4 05/27/2014 0855   MCH 26.5 05/27/2014 0855   MCHC 33.3 05/27/2014 0855   RDW 13.2 05/27/2014 0855   LYMPHSABS 2.8 05/27/2014 0855   MONOABS 0.7 05/27/2014 0855   EOSABS 0.1 05/27/2014 0855   BASOSABS 0.1 05/27/2014 0855    BMET    Component Value Date/Time   NA 142 03/11/2016 0818   K 2.8 (L) 03/11/2016 0818   CL 99 (L) 03/11/2016 0818   CO2 32 05/27/2014 0855   GLUCOSE 215 (H) 03/11/2016  0818   BUN 10 03/11/2016 0818   CREATININE 0.60 03/11/2016 0818   CREATININE 0.58 12/18/2013 1515   CALCIUM 8.9 05/27/2014 0855   GFRNONAA >90 05/27/2014 0855   GFRAA >90 05/27/2014 0855      Discharge Instructions    Call MD for:  redness, tenderness, or signs of infection (pain, swelling, bleeding, redness, odor or green/yellow discharge around incision site)    Complete by:  As directed    Call MD for:  severe or increased pain, loss or decreased feeling  in affected limb(s)    Complete by:  As directed    Call MD for:  temperature >100.5    Complete by:  As directed     Discharge wound care:    Complete by:  As directed    Continue current wound care   Resume previous diet    Complete by:  As directed       Discharge Diagnosis:  Ischemic left great toe  Secondary Diagnosis: Patient Active Problem List   Diagnosis Date Noted  . PAD (peripheral artery disease) ([place]) 03/11/2016  . Dysuria 10/24/2014  . Osteomyelitis ([place])   . Cough   . Hx of right BKA ([place]) 05/26/2014  . S/P BKA (below knee amputation) unilateral ([place])   . Abnormal EKG   . Preop cardiovascular exam 05/21/2014  . Cellulitis of toe of right foot   . Peripheral arterial disease ([place])   . Cellulitis 05/13/2014  . Type 2 diabetes mellitus with complication ([place])   . HLD (hyperlipidemia)   . Pure hypercholesterolemia 11/14/2013  . High risk social situation 11/13/2013  . Back pain without radiation 09/05/2011    Class: Acute  . Osteoarthritis of left hip 05/31/2011  . History of primary TB 04/26/2011  . DM (diabetes mellitus), type 2, uncontrolled ([place]) 05/17/2010  . Essential hypertension 12/09/2009   Past Medical History:  Diagnosis Date  . Cellulitis and abscess of foot 05/13/2014   rt foot  . Diabetes mellitus   . Hypertension   . Positive TB test 2013     Allergies as of 03/12/2016      Reactions   Bactrim [sulfamethoxazole-trimethoprim] Nausea And Vomiting   Pt and family verified that pt takes this medication   Tuberculin Ppd    Ace Inhibitors Cough   cough      Medication List    STOP taking these medications   ACCU-CHEK NANO SMARTVIEW w/Device Kit   accu-chek soft touch lancets   amoxicillin-clavulanate 500-125 MG tablet Commonly known as:  AUGMENTIN   atorvastatin 40 MG tablet Commonly known as:  LIPITOR   cephALEXin 500 MG capsule Commonly known as:  KEFLEX   fluconazole 150 MG tablet Commonly known as:  DIFLUCAN   glucose blood test strip Commonly known as:  ACCU-CHEK SMARTVIEW   insulin aspart 100 UNIT/ML FlexPen Commonly known as:   NOVOLOG   insulin glargine 100 UNIT/ML injection Commonly known as:  LANTUS   Insulin Glargine 100 UNIT/ML Solostar Pen Commonly known as:  LANTUS SOLOSTAR   lactobacillus acidophilus Tabs tablet   polyethylene glycol packet Commonly known as:  MIRALAX / GLYCOLAX   TOUJEO SOLOSTAR 300 UNIT/ML Sopn Generic drug:  Insulin Glargine     TAKE these medications   acetaminophen 650 MG CR tablet Commonly known as:  TYLENOL Take 650 mg by mouth every 8 (eight) hours as needed for pain. What changed:  Another medication with the same name was removed. Continue taking this medication, and follow the  directions you see here.   amLODipine-atorvastatin 10-40 MG tablet Commonly known as:  CADUET Take 1 tablet by mouth daily.   aspirin 81 MG tablet Take 1 tablet (81 mg total) by mouth daily.   bisacodyl 10 MG suppository Commonly known as:  DULCOLAX Place 10 mg rectally as needed for moderate constipation.   citalopram 20 MG tablet Commonly known as:  CELEXA Take 20 mg by mouth daily.   clopidogrel 75 MG tablet Commonly known as:  PLAVIX Take 1 tablet (75 mg total) by mouth daily with breakfast.   glipiZIDE 5 MG 24 hr tablet Commonly known as:  GLUCOTROL XL Take 5 mg by mouth daily with breakfast.   hydrochlorothiazide 25 MG tablet Commonly known as:  HYDRODIURIL Take 1 tablet (25 mg total) by mouth daily.   levofloxacin 500 MG tablet Commonly known as:  LEVAQUIN Take 500 mg by mouth daily.   magnesium hydroxide 400 MG/5ML suspension Commonly known as:  MILK OF MAGNESIA Take 30 mLs by mouth daily as needed for mild constipation.   metFORMIN 500 MG 24 hr tablet Commonly known as:  GLUCOPHAGE-XR Take 1,000 mg by mouth daily with breakfast.   oxyCODONE 5 MG immediate release tablet Commonly known as:  Oxy IR/ROXICODONE Take 2.5 mg by mouth every 4 (four) hours as needed for severe pain. What changed:  Another medication with the same name was removed. Continue taking  this medication, and follow the directions you see here.   RA SALINE ENEMA 19-7 GM/118ML Enem Place rectally daily as needed (severe constipation).   Vitamin D3 50000 units Caps Take 50,000 Units by mouth daily.      New Medication: 1.  Plavix 98m daily  Instructions: 1.  Resume Metformin on 03/14/16 as she had IV contrast on 03/11/16. 2.  Continue Levaquin 3.  Start Plavix-will be on this indefinitely 4.  Shower daily with soap and water  Disposition: SNF  Patient's condition: is Good  Follow up: 1. Dr. FOneida Alarin 3 weeks with ABI's, arterial duplex and carotid duplex   (Husband is given a note for being out of work on 03/11/16 for pt's procedure).   SLeontine Locket PA-C [facility] [phone]/20/2018  11:40 AM

## 2016-03-14 ENCOUNTER — Encounter (HOSPITAL_COMMUNITY): Payer: Self-pay | Admitting: Vascular Surgery

## 2016-03-14 ENCOUNTER — Other Ambulatory Visit: Payer: Self-pay | Admitting: *Deleted

## 2016-03-14 DIAGNOSIS — Z01810 Encounter for preprocedural cardiovascular examination: Secondary | ICD-10-CM

## 2016-03-14 MED FILL — Morphine Sulfate IV Soln PF 4 MG/ML: INTRAVENOUS | Qty: 1 | Status: AC

## 2016-04-11 ENCOUNTER — Ambulatory Visit (INDEPENDENT_AMBULATORY_CARE_PROVIDER_SITE_OTHER)
Admission: RE | Admit: 2016-04-11 | Discharge: 2016-04-11 | Disposition: A | Discharge status: Home / Self Care | Payer: Medicare (Managed Care) | Source: Ambulatory Visit

## 2016-04-11 ENCOUNTER — Ambulatory Visit (INDEPENDENT_AMBULATORY_CARE_PROVIDER_SITE_OTHER)
Admission: RE | Admit: 2016-04-11 | Discharge: 2016-04-11 | Disposition: A | Discharge status: Home / Self Care | Payer: Medicare (Managed Care) | Source: Ambulatory Visit | Attending: Vascular Surgery | Admitting: Vascular Surgery

## 2016-04-11 ENCOUNTER — Ambulatory Visit (HOSPITAL_COMMUNITY)
Admission: RE | Admit: 2016-04-11 | Discharge: 2016-04-11 | Disposition: A | Discharge status: Home / Self Care | Payer: Medicare (Managed Care) | Source: Ambulatory Visit | Attending: Vascular Surgery | Admitting: Vascular Surgery

## 2016-04-11 DIAGNOSIS — Z01810 Encounter for preprocedural cardiovascular examination: Secondary | ICD-10-CM

## 2016-04-11 DIAGNOSIS — I739 Peripheral vascular disease, unspecified: Secondary | ICD-10-CM | POA: Insufficient documentation

## 2016-04-11 DIAGNOSIS — I6523 Occlusion and stenosis of bilateral carotid arteries: Secondary | ICD-10-CM | POA: Insufficient documentation

## 2016-04-11 DIAGNOSIS — Z95828 Presence of other vascular implants and grafts: Secondary | ICD-10-CM | POA: Diagnosis not present

## 2016-04-11 DIAGNOSIS — Z89411 Acquired absence of right great toe: Secondary | ICD-10-CM | POA: Insufficient documentation

## 2016-04-11 LAB — VAS US CAROTID
LCCADSYS: -72 cm/s
LCCAPDIAS: 16 cm/s
LEFT ECA DIAS: -16 cm/s
LEFT VERTEBRAL DIAS: -14 cm/s
LICAPDIAS: -16 cm/s
LICAPSYS: -85 cm/s
Left CCA dist dias: -19 cm/s
Left CCA prox sys: 74 cm/s
Left ICA dist dias: -19 cm/s
Left ICA dist sys: -54 cm/s
RCCAPSYS: -66 cm/s
RIGHT CCA MID DIAS: -15 cm/s
RIGHT ECA DIAS: -11 cm/s
RIGHT VERTEBRAL DIAS: 25 cm/s
Right CCA prox dias: -8 cm/s
Right cca dist sys: -83 cm/s

## 2016-04-13 ENCOUNTER — Encounter: Payer: Self-pay | Admitting: Vascular Surgery

## 2016-04-14 ENCOUNTER — Ambulatory Visit: Payer: Medicare (Managed Care) | Admitting: Vascular Surgery

## 2016-04-20 ENCOUNTER — Ambulatory Visit (INDEPENDENT_AMBULATORY_CARE_PROVIDER_SITE_OTHER): Payer: Medicare (Managed Care) | Admitting: Vascular Surgery

## 2016-04-20 ENCOUNTER — Encounter: Payer: Self-pay | Admitting: Vascular Surgery

## 2016-04-20 VITALS — BP 140/68 | HR 66 | Temp 98.4°F | Resp 18 | Ht 62.4 in | Wt 140.0 lb

## 2016-04-20 DIAGNOSIS — I739 Peripheral vascular disease, unspecified: Secondary | ICD-10-CM | POA: Diagnosis not present

## 2016-04-20 NOTE — Progress Notes (Signed)
Patient is a 56 year old female who returns for follow-up today after recent left superficial femoral artery stenting. This was done for a early gangrenous change of her left first toe. The patient currently resides in a skilled nursing facility. She states the left first toe is still painful. She has previously had a right below-knee amputation by Dr. Kellie Simmering several years ago. Today's interview was conducted through an interpreter. She speaks Arabic.  Past Medical History:  Diagnosis Date  . Allergy   . Anemia   . Anxiety   . Cellulitis and abscess of foot 05/13/2014   rt foot  . Diabetes mellitus   . Hypertension   . Positive TB test 2013     Physical exam:  Vitals:   04/20/16 0925  BP: 140/68  Pulse: 66  Resp: 18  Temp: 98.4 F (36.9 C)  TempSrc: Oral  SpO2: 95%  Weight: 140 lb (63.5 kg)  Height: 5' 2.4" (1.585 m)    Extremities: No palpable pedal pulses left foot dry gangrenous changes left first toe with demarcation in the proximal phalanx, no erythema no drainage  Data: Patient had a duplex ultrasound of her carotid arteries on 04/11/2016 which showed no significant carotid stenosis.  The patient also had bilateral ABIs performed on February 19 which show her ABI has improved from 0.4 6.75 post procedure. Duplex also showed  a patent left SFA stent.  Assessment: Dry gangrene left first toe status post left SFA stent. I had a discussion with the patient today that her revascularization is as good as it going to get at this point. She is still having pain in the left first toe and has dry gangrenous changes. I believe the best option for long-term pain control would be amputation of her left first toe. However she is concerned that this may lead to another below-knee dictation. I did discuss with her that I thought she had adequate perfusion to heal the toe amputation but currently she does not wish to proceed with this. She will call us if she decides to go forward with the  toe amputation. Otherwise she will follow-up with [place] in 3 months time for repeat ABIs in a duplex scan.  Ruta Hinds, MD [facility] [place] Office: [phone] Pager: [phone]

## 2016-04-26 NOTE — Addendum Note (Signed)
Addended by: Lianne Cure A on: 04/26/2016 04:16 PM   Modules accepted: Orders

## 2016-06-07 ENCOUNTER — Encounter (HOSPITAL_COMMUNITY): Payer: Self-pay | Admitting: *Deleted

## 2016-07-28 ENCOUNTER — Encounter: Payer: Self-pay | Admitting: Family

## 2016-08-03 ENCOUNTER — Ambulatory Visit: Payer: Medicare (Managed Care) | Admitting: Family

## 2016-08-03 ENCOUNTER — Other Ambulatory Visit (HOSPITAL_COMMUNITY): Payer: Medicare (Managed Care)

## 2016-08-08 ENCOUNTER — Emergency Department (HOSPITAL_COMMUNITY): Payer: No Typology Code available for payment source

## 2016-08-08 ENCOUNTER — Inpatient Hospital Stay (HOSPITAL_COMMUNITY)
Admission: EM | Admit: 2016-08-08 | Discharge: 2016-08-19 | DRG: 616 | Disposition: A | Discharge status: Rehabilitation Facility | Payer: No Typology Code available for payment source | Attending: Internal Medicine | Admitting: Internal Medicine

## 2016-08-08 ENCOUNTER — Encounter (HOSPITAL_COMMUNITY): Payer: Self-pay | Admitting: Emergency Medicine

## 2016-08-08 DIAGNOSIS — I5033 Acute on chronic diastolic (congestive) heart failure: Secondary | ICD-10-CM | POA: Diagnosis present

## 2016-08-08 DIAGNOSIS — M86672 Other chronic osteomyelitis, left ankle and foot: Secondary | ICD-10-CM | POA: Diagnosis present

## 2016-08-08 DIAGNOSIS — M869 Osteomyelitis, unspecified: Secondary | ICD-10-CM | POA: Diagnosis present

## 2016-08-08 DIAGNOSIS — M868X7 Other osteomyelitis, ankle and foot: Secondary | ICD-10-CM | POA: Diagnosis not present

## 2016-08-08 DIAGNOSIS — E1165 Type 2 diabetes mellitus with hyperglycemia: Secondary | ICD-10-CM | POA: Diagnosis present

## 2016-08-08 DIAGNOSIS — F329 Major depressive disorder, single episode, unspecified: Secondary | ICD-10-CM | POA: Diagnosis not present

## 2016-08-08 DIAGNOSIS — Z79899 Other long term (current) drug therapy: Secondary | ICD-10-CM | POA: Diagnosis not present

## 2016-08-08 DIAGNOSIS — E1151 Type 2 diabetes mellitus with diabetic peripheral angiopathy without gangrene: Secondary | ICD-10-CM | POA: Diagnosis not present

## 2016-08-08 DIAGNOSIS — E8779 Other fluid overload: Secondary | ICD-10-CM | POA: Diagnosis not present

## 2016-08-08 DIAGNOSIS — Z794 Long term (current) use of insulin: Secondary | ICD-10-CM

## 2016-08-08 DIAGNOSIS — I10 Essential (primary) hypertension: Secondary | ICD-10-CM | POA: Diagnosis not present

## 2016-08-08 DIAGNOSIS — E1169 Type 2 diabetes mellitus with other specified complication: Secondary | ICD-10-CM | POA: Diagnosis not present

## 2016-08-08 DIAGNOSIS — I110 Hypertensive heart disease with heart failure: Secondary | ICD-10-CM | POA: Diagnosis present

## 2016-08-08 DIAGNOSIS — J810 Acute pulmonary edema: Secondary | ICD-10-CM | POA: Diagnosis not present

## 2016-08-08 DIAGNOSIS — Z9109 Other allergy status, other than to drugs and biological substances: Secondary | ICD-10-CM | POA: Diagnosis not present

## 2016-08-08 DIAGNOSIS — I5031 Acute diastolic (congestive) heart failure: Secondary | ICD-10-CM

## 2016-08-08 DIAGNOSIS — Z881 Allergy status to other antibiotic agents status: Secondary | ICD-10-CM

## 2016-08-08 DIAGNOSIS — E1152 Type 2 diabetes mellitus with diabetic peripheral angiopathy with gangrene: Secondary | ICD-10-CM | POA: Diagnosis not present

## 2016-08-08 DIAGNOSIS — E785 Hyperlipidemia, unspecified: Secondary | ICD-10-CM | POA: Diagnosis present

## 2016-08-08 DIAGNOSIS — I5030 Unspecified diastolic (congestive) heart failure: Secondary | ICD-10-CM | POA: Diagnosis not present

## 2016-08-08 DIAGNOSIS — Z95828 Presence of other vascular implants and grafts: Secondary | ICD-10-CM | POA: Diagnosis not present

## 2016-08-08 DIAGNOSIS — R1013 Epigastric pain: Secondary | ICD-10-CM | POA: Diagnosis not present

## 2016-08-08 DIAGNOSIS — G629 Polyneuropathy, unspecified: Secondary | ICD-10-CM | POA: Diagnosis not present

## 2016-08-08 DIAGNOSIS — D72829 Elevated white blood cell count, unspecified: Secondary | ICD-10-CM

## 2016-08-08 DIAGNOSIS — R0602 Shortness of breath: Secondary | ICD-10-CM

## 2016-08-08 DIAGNOSIS — Z89511 Acquired absence of right leg below knee: Secondary | ICD-10-CM

## 2016-08-08 DIAGNOSIS — R7309 Other abnormal glucose: Secondary | ICD-10-CM | POA: Diagnosis not present

## 2016-08-08 DIAGNOSIS — E11649 Type 2 diabetes mellitus with hypoglycemia without coma: Secondary | ICD-10-CM | POA: Diagnosis not present

## 2016-08-08 DIAGNOSIS — R0902 Hypoxemia: Secondary | ICD-10-CM

## 2016-08-08 DIAGNOSIS — IMO0002 Reserved for concepts with insufficient information to code with codable children: Secondary | ICD-10-CM | POA: Diagnosis present

## 2016-08-08 DIAGNOSIS — G8918 Other acute postprocedural pain: Secondary | ICD-10-CM | POA: Diagnosis not present

## 2016-08-08 DIAGNOSIS — E8809 Other disorders of plasma-protein metabolism, not elsewhere classified: Secondary | ICD-10-CM | POA: Diagnosis not present

## 2016-08-08 DIAGNOSIS — J95821 Acute postprocedural respiratory failure: Secondary | ICD-10-CM | POA: Diagnosis not present

## 2016-08-08 DIAGNOSIS — Z7902 Long term (current) use of antithrombotics/antiplatelets: Secondary | ICD-10-CM | POA: Diagnosis not present

## 2016-08-08 DIAGNOSIS — Z89612 Acquired absence of left leg above knee: Secondary | ICD-10-CM

## 2016-08-08 DIAGNOSIS — E876 Hypokalemia: Secondary | ICD-10-CM | POA: Diagnosis present

## 2016-08-08 DIAGNOSIS — Z7982 Long term (current) use of aspirin: Secondary | ICD-10-CM

## 2016-08-08 DIAGNOSIS — R0989 Other specified symptoms and signs involving the circulatory and respiratory systems: Secondary | ICD-10-CM | POA: Diagnosis not present

## 2016-08-08 DIAGNOSIS — F411 Generalized anxiety disorder: Secondary | ICD-10-CM

## 2016-08-08 DIAGNOSIS — R0600 Dyspnea, unspecified: Secondary | ICD-10-CM

## 2016-08-08 DIAGNOSIS — J9600 Acute respiratory failure, unspecified whether with hypoxia or hypercapnia: Secondary | ICD-10-CM | POA: Diagnosis not present

## 2016-08-08 DIAGNOSIS — D62 Acute posthemorrhagic anemia: Secondary | ICD-10-CM | POA: Diagnosis not present

## 2016-08-08 DIAGNOSIS — I96 Gangrene, not elsewhere classified: Secondary | ICD-10-CM | POA: Diagnosis not present

## 2016-08-08 DIAGNOSIS — I739 Peripheral vascular disease, unspecified: Secondary | ICD-10-CM | POA: Diagnosis present

## 2016-08-08 DIAGNOSIS — E118 Type 2 diabetes mellitus with unspecified complications: Secondary | ICD-10-CM | POA: Diagnosis not present

## 2016-08-08 DIAGNOSIS — I70262 Atherosclerosis of native arteries of extremities with gangrene, left leg: Secondary | ICD-10-CM

## 2016-08-08 LAB — CBC WITH DIFFERENTIAL/PLATELET
Basophils Absolute: 0.1 10*3/uL (ref 0.0–0.1)
Basophils Relative: 0 %
Eosinophils Absolute: 0.1 10*3/uL (ref 0.0–0.7)
Eosinophils Relative: 0 %
HCT: 29.7 % — ABNORMAL LOW (ref 36.0–46.0)
Hemoglobin: 9.7 g/dL — ABNORMAL LOW (ref 12.0–15.0)
Lymphocytes Relative: 13 %
Lymphs Abs: 3.3 10*3/uL (ref 0.7–4.0)
MCH: 25.1 pg — ABNORMAL LOW (ref 26.0–34.0)
MCHC: 32.7 g/dL (ref 30.0–36.0)
MCV: 76.9 fL — ABNORMAL LOW (ref 78.0–100.0)
Monocytes Absolute: 1.5 10*3/uL — ABNORMAL HIGH (ref 0.1–1.0)
Monocytes Relative: 6 %
Neutro Abs: 19.9 10*3/uL — ABNORMAL HIGH (ref 1.7–7.7)
Neutrophils Relative %: 81 %
Platelets: 465 10*3/uL — ABNORMAL HIGH (ref 150–400)
RBC: 3.86 MIL/uL — ABNORMAL LOW (ref 3.87–5.11)
RDW: 13.8 % (ref 11.5–15.5)
WBC: 24.9 10*3/uL — ABNORMAL HIGH (ref 4.0–10.5)

## 2016-08-08 LAB — BASIC METABOLIC PANEL
Anion gap: 12 (ref 5–15)
BUN: 17 mg/dL (ref 6–20)
CO2: 29 mmol/L (ref 22–32)
Calcium: 8.7 mg/dL — ABNORMAL LOW (ref 8.9–10.3)
Chloride: 93 mmol/L — ABNORMAL LOW (ref 101–111)
Creatinine, Ser: 0.91 mg/dL (ref 0.44–1.00)
GFR calc Af Amer: >60 mL/min (ref >60)
GFR calc non Af Amer: >60 mL/min (ref >60)
Glucose, Bld: 225 mg/dL — ABNORMAL HIGH (ref 65–99)
Potassium: 3.1 mmol/L — ABNORMAL LOW (ref 3.5–5.1)
Sodium: 134 mmol/L — ABNORMAL LOW (ref 135–145)

## 2016-08-08 LAB — I-STAT CG4 LACTIC ACID, ED: Lactic Acid, Venous: 1.52 mmol/L (ref 0.5–1.9)

## 2016-08-08 MED ORDER — POTASSIUM CHLORIDE CRYS ER 20 MEQ PO TBCR
40.0000 meq | EXTENDED_RELEASE_TABLET | Freq: Two times a day (BID) | ORAL | Status: AC
  Administered 2016-08-08: 40 meq via ORAL
  Filled 2016-08-08: qty 2

## 2016-08-08 MED ORDER — SODIUM CHLORIDE 0.9 % IV SOLN
INTRAVENOUS | Status: AC
  Administered 2016-08-08: via INTRAVENOUS

## 2016-08-08 MED ORDER — PIPERACILLIN-TAZOBACTAM 3.375 G IVPB 30 MIN
3.3750 g | Freq: Once | INTRAVENOUS | Status: AC
  Administered 2016-08-08: 3.375 g via INTRAVENOUS
  Filled 2016-08-08: qty 50

## 2016-08-08 MED ORDER — CITALOPRAM HYDROBROMIDE 20 MG PO TABS
20.0000 mg | ORAL_TABLET | Freq: Every day | ORAL | Status: DC
  Administered 2016-08-10 – 2016-08-19 (×10): 20 mg via ORAL
  Filled 2016-08-08 (×10): qty 1

## 2016-08-08 MED ORDER — ACETAMINOPHEN 325 MG PO TABS
650.0000 mg | ORAL_TABLET | Freq: Once | ORAL | Status: AC
  Administered 2016-08-08: 650 mg via ORAL
  Filled 2016-08-08: qty 2

## 2016-08-08 MED ORDER — HYDROCODONE-ACETAMINOPHEN 5-325 MG PO TABS
1.0000 | ORAL_TABLET | ORAL | Status: DC | PRN
  Administered 2016-08-09 (×2): 2 via ORAL
  Filled 2016-08-08 (×2): qty 2

## 2016-08-08 MED ORDER — POTASSIUM CHLORIDE IN NACL 20-0.9 MEQ/L-% IV SOLN
Freq: Once | INTRAVENOUS | Status: AC
  Administered 2016-08-08: 125 mL/h via INTRAVENOUS
  Filled 2016-08-08: qty 1000

## 2016-08-08 MED ORDER — SODIUM CHLORIDE 0.9 % IV BOLUS (SEPSIS)
1000.0000 mL | Freq: Once | INTRAVENOUS | Status: AC
  Administered 2016-08-08: 1000 mL via INTRAVENOUS

## 2016-08-08 MED ORDER — ONDANSETRON HCL 4 MG/2ML IJ SOLN: 4.0000 mg | Freq: Three times a day (TID) | INTRAMUSCULAR | Status: AC | PRN

## 2016-08-08 MED ORDER — INSULIN ASPART 100 UNIT/ML ~~LOC~~ SOLN
0.0000 [IU] | Freq: Three times a day (TID) | SUBCUTANEOUS | Status: DC
  Administered 2016-08-09: 2 [IU] via SUBCUTANEOUS
  Administered 2016-08-09: 5 [IU] via SUBCUTANEOUS
  Administered 2016-08-10: 7 [IU] via SUBCUTANEOUS

## 2016-08-08 MED ORDER — DEXTROSE 5 % IV SOLN
1.5000 g | INTRAVENOUS | Status: DC
  Filled 2016-08-08 (×2): qty 1.5

## 2016-08-08 MED ORDER — SENNA 8.6 MG PO TABS: 1.0000 | ORAL_TABLET | Freq: Every day | ORAL | Status: DC | PRN

## 2016-08-08 MED ORDER — SODIUM CHLORIDE 0.9 % IV BOLUS (SEPSIS)
905.0000 mL | Freq: Once | INTRAVENOUS | Status: AC
  Administered 2016-08-08: 905 mL via INTRAVENOUS

## 2016-08-08 MED ORDER — VANCOMYCIN HCL IN DEXTROSE 750-5 MG/150ML-% IV SOLN
750.0000 mg | Freq: Two times a day (BID) | INTRAVENOUS | Status: DC
  Administered 2016-08-08 – 2016-08-16 (×17): 750 mg via INTRAVENOUS
  Filled 2016-08-08 (×18): qty 150

## 2016-08-08 MED ORDER — ENOXAPARIN SODIUM 40 MG/0.4ML ~~LOC~~ SOLN
40.0000 mg | SUBCUTANEOUS | Status: DC
  Administered 2016-08-08 – 2016-08-14 (×7): 40 mg via SUBCUTANEOUS
  Filled 2016-08-08 (×8): qty 0.4

## 2016-08-08 MED ORDER — DOCUSATE SODIUM 100 MG PO CAPS: 100.0000 mg | ORAL_CAPSULE | Freq: Two times a day (BID) | ORAL | Status: DC | PRN

## 2016-08-08 MED ORDER — DEXTROSE 5 % IV SOLN
1.5000 g | INTRAVENOUS | Status: AC
  Administered 2016-08-09: 1.5 g via INTRAVENOUS
  Filled 2016-08-08: qty 1.5

## 2016-08-08 MED ORDER — HYDROMORPHONE HCL 1 MG/ML IJ SOLN
1.0000 mg | INTRAMUSCULAR | Status: DC | PRN
  Administered 2016-08-10 – 2016-08-17 (×8): 1 mg via INTRAVENOUS
  Filled 2016-08-08 (×8): qty 1

## 2016-08-08 NOTE — ED Triage Notes (Signed)
Per EMS, patient is from [place] [facility] [place] complaining of left foot big toe pain.  Hx of gangrene.  Declined amputation recently but now needs to be reevaluated. A/O at baseline.  Hx of DM and hypertension.  Right leg amputated above knee.  No IV.  BP 158/68, 70 HR, RR 18, CBG 256, 96% RA.

## 2016-08-08 NOTE — ED Notes (Signed)
Bone exposed on left great toe.

## 2016-08-08 NOTE — Consult Note (Signed)
Hospital Consult    Reason for Consult:  Left 1st toe necrosis Requesting Physician:  ED MRN #:  [id number]  History of Present Illness: This is a 56 y.o. female history of a right below-knee amputation and recently underwent left SFA stenting and was offered left first toe amputation but refused in February. She now returns with progressive necrotic changes of her left first toe with significant pain as well as white count. She is febrile to 100.1 on exam today. My interview is conducted through an interpreter and as such is limited. She does take aspirin and Plavix. She is nonambulatory but uses the left leg for transfer.  Past Medical History:  Diagnosis Date  . Allergy   . Anemia   . Anxiety   . Cellulitis and abscess of foot 05/13/2014   rt foot  . Diabetes mellitus   . Hypertension   . Positive TB test 2013    Past Surgical History:  Procedure Laterality Date  . ABDOMINAL AORTAGRAM N/A 05/20/2014   Procedure: ABDOMINAL Maxcine Ham;  Surgeon: Serafina Mitchell, MD;  [facility] C[facility] LAB;  Service: Cardiovascular;  Laterality: N/A;  . AMPUTATION Right 05/23/2014   Procedure: AMPUTATION BELOW KNEE;  Surgeon: Mal Misty, MD;  Location: [place];  Service: Vascular;  Laterality: Right;  . PERIPHERAL VASCULAR CATHETERIZATION N/A 03/11/2016   Procedure: Abdominal Aortogram w/Lower Extremity;  Surgeon: Elam Dutch, MD;  Location: [place] CV LAB;  Service: Cardiovascular;  Laterality: N/A;  . PERIPHERAL VASCULAR CATHETERIZATION Left 03/11/2016   Procedure: Peripheral Vascular Intervention;  Surgeon: Elam Dutch, MD;  Location: [place] CV LAB;  Service: Cardiovascular;  Laterality: Left;  Superficial femoral    Allergies  Allergen Reactions  . Bactrim [Sulfamethoxazole-Trimethoprim] Nausea And Vomiting    Pt and family verified that pt takes this medication  . Tuberculin Ppd   . Ace Inhibitors Cough    cough    Prior to Admission medications   Medication Sig Start  Date End Date Taking? Authorizing Provider  acetaminophen (TYLENOL) 650 MG CR tablet Take 650 mg by mouth every 8 (eight) hours as needed for pain.    [provider]  amLODipine (NORVASC) 10 MG tablet Take 10 mg by mouth daily.    [provider]  amLODipine-atorvastatin (CADUET) 10-40 MG tablet Take 1 tablet by mouth daily.    [provider]  aspirin 81 MG tablet Take 1 tablet (81 mg total) by mouth daily. 01/20/15   Veatrice Bourbon, MD  aspirin 81 MG tablet Take 81 mg by mouth daily.    [provider]  bisacodyl (DULCOLAX) 10 MG suppository Place 10 mg rectally as needed for moderate constipation.    [provider]  Cholecalciferol (VITAMIN D3) 50000 units CAPS Take 50,000 Units by mouth daily.    [provider]  citalopram (CELEXA) 20 MG tablet Take 20 mg by mouth daily.    [provider]  clopidogrel (PLAVIX) 75 MG tablet Take 1 tablet (75 mg total) by mouth daily with breakfast. 03/12/16   Rhyne, Samantha J, PA-C  glipiZIDE (GLUCOTROL XL) 5 MG 24 hr tablet Take 5 mg by mouth daily with breakfast.    [provider]  hydrochlorothiazide (HYDRODIURIL) 25 MG tablet Take 25 mg by mouth daily.    [provider]  hydrochlorothiazide (HYDRODIURIL) 25 MG tablet Take 1 tablet (25 mg total) by mouth daily. 02/27/15   Haney, Yetta Flock A, MD  insulin aspart (NOVOLOG) 100 UNIT/ML injection Inject  into the skin 3 (three) times daily before meals.    [provider]  magnesium hydroxide (MILK OF MAGNESIA) 400 MG/5ML suspension Take 30 mLs by mouth daily as needed for mild constipation.    [provider]  metFORMIN (GLUCOPHAGE-XR) 500 MG 24 hr tablet Take 1,000 mg by mouth daily with breakfast.    [provider]  metoprolol tartrate (LOPRESSOR) 25 MG tablet Take 25 mg by mouth 2 (two) times daily.    [provider]  oxyCODONE (OXY IR/ROXICODONE) 5 MG immediate release tablet Take 2.5 mg by  mouth every 4 (four) hours as needed for severe pain.    [provider]  Sodium Phosphates (RA SALINE ENEMA) 19-7 GM/118ML ENEM Place rectally daily as needed (severe constipation).    [provider]    Social History   Social History  . Marital status: Married    Spouse name: N/A  . Number of children: N/A  . Years of education: N/A   Occupational History  . Not on file.   Social History Main Topics  . Smoking status: Never Smoker  . Smokeless tobacco: Never Used  . Alcohol use No  . Drug use: No  . Sexual activity: Yes    Birth control/ protection: Post-menopausal   Other Topics Concern  . Not on file   Social History Narrative   ** Merged History Encounter **         Family History  Problem Relation Age of Onset  . Heart disease Unknown        No family history    ROS: [x]  Positive   [ ]  Negative   [ ]  All sytems reviewed and are negative  Cardiovascular: []  chest pain/pressure []  palpitations []  SOB lying flat []  DOE []  pain in legs while walking []  pain in legs at rest []  pain in legs at night []  non-healing ulcers []  hx of DVT []  swelling in legs  Pulmonary: []  productive cough []  asthma/wheezing []  home O2  Neurologic: []  weakness in []  arms []  legs []  numbness in []  arms []  legs []  hx of CVA []  mini stroke [] difficulty speaking or slurred speech []  temporary loss of vision in one eye []  dizziness  Hematologic: []  hx of cancer []  bleeding problems []  problems with blood clotting easily  Endocrine:   []  diabetes []  thyroid disease  GI []  vomiting blood []  blood in stool  GU: []  CKD/renal failure []  HD--[]  M/W/F or []  T/T/S []  burning with urination []  blood in urine  Psychiatric: []  anxiety []  depression  Musculoskeletal: []  arthritis []  joint pain  Integumentary: []  rashes [x]  ulcers  Constitutional: [x]  fever []  chills   Physical Examination  Vitals:   08/08/16 1455 08/08/16 1814  BP: (!)  146/57 111/89  Pulse: 74 81  Resp: 19 16  Temp: 99.6 F (37.6 C) 100.1 F (37.8 C)   There is no height or weight on file to calculate BMI.  General:  WDWN in NAD Gait: Not observed HENT: WNL, normocephalic Pulmonary: normal non-labored breathing, without Rales, rhonchi,  wheezing Cardiac: palpable left popliteal pulse Abdomen: soft, NT/ND, no masses Musculoskeletal: necrotic left 1st toe, well healed right bka Neurologic: A&O X 3;; SENSATION: normal; MOTOR FUNCTION:  moving all extremities equally. Speech is fluent/normal Psychiatric:  Appropriate mood and affect  CBC    Component Value Date/Time   WBC 24.9 (H) 08/08/2016 1650   RBC 3.86 (L) 08/08/2016 1650   HGB 9.7 (L) 08/08/2016 1650  HCT 29.7 (L) 08/08/2016 1650   PLT 465 (H) 08/08/2016 1650   MCV 76.9 (L) 08/08/2016 1650   MCH 25.1 (L) 08/08/2016 1650   MCHC 32.7 08/08/2016 1650   RDW 13.8 08/08/2016 1650   LYMPHSABS 3.3 08/08/2016 1650   MONOABS 1.5 (H) 08/08/2016 1650   EOSABS 0.1 08/08/2016 1650   BASOSABS 0.1 08/08/2016 1650    BMET    Component Value Date/Time   NA 134 (L) 08/08/2016 1650   K 3.1 (L) 08/08/2016 1650   CL 93 (L) 08/08/2016 1650   CO2 29 08/08/2016 1650   GLUCOSE 225 (H) 08/08/2016 1650   BUN 17 08/08/2016 1650   CREATININE 0.91 08/08/2016 1650   CREATININE 0.58 12/18/2013 1515   CALCIUM 8.7 (L) 08/08/2016 1650   GFRNONAA >60 08/08/2016 1650   GFRAA >60 08/08/2016 1650    COAGS: No results found for: INR, PROTIME   Non-Invasive Vascular Imaging:   Loss of soft tissue in the region of first distal phalanx. Areas of osteomyelitis involving portions of the first proximal and distal phalanges as well as along the distal most aspect of the first MTP joint in the midline. Fracture along the lateral aspect of the proximal portion of the first proximal phalanx in area of osteomyelitis.  Bones diffusely osteoporotic. No other fracture elsewhere. No dislocation. No other areas of  bony destruction.  ASSESSMENT/PLAN: This is a 56 y.o. female with history of right BKA and left SFA stenting. She denies progressive necrotic changes to her left great toe with loss of all soft tissue. She also has an elevated white count and is febrile. She is being admitted to the hospitalist with antibiotics. I will plan for left first toe amputation tomorrow morning and I have given her a low chance of this healing after reviewing her angiogram which demonstrates very poor tibial runoff. She does have a palpable left popliteal pulse and as such there is no further revascularization that is likely to help at this point. She will remain a very high risk for above or below-knee amputation during this hospitalization. She demonstrates understanding through the interpreter. Nothing by mouth at midnight.  Madylyn Insco C. Donzetta Matters, MD [facility] [place] Office: [phone] Pager: [phone]

## 2016-08-08 NOTE — ED Provider Notes (Signed)
[facility] DEPT Provider Note   CSN: [id number] Arrival date & time: 08/08/16  1428     History   Chief Complaint Chief Complaint  Patient presents with  . Toe Pain    Left, big toe    HPI Kimberly Lane is a 56 y.o. female with history of diabetes, hypertension, BKA right lower extremity, PAD with gangrene of left great toe who presents with worsening left great toe pain. Patient has been evaluated right vascular who recommended amputation of the left great toe in February, however patient declined for fear that she would eventually need another BKA. However, patient's toe has become progressively worse with exposed bone. There is also yellow drainage. She presents for reevaluation from her skilled nursing facility.Patient has had associated decreased appetite, dizziness, nausea, and suprapubic pain. She also reports of pain with urination. Patient denies any chest pain, shortness of breath.  I spoke to Dr. Jimmye Norman, the patient's doctor at [facility], who reported Patient has had symptoms very long-term. Patient was recently treated for cellulitis of the left lower extremity up to the knee with Augmentin BID. Augmentin was initiated one week ago. The cellulitis has improved and no longer extends to the knee. However Dr. Jimmye Norman noticed a new swelling to plantar surface proximal to L MTP. Dr. Jimmye Norman had a meeting with the patient, her husband, and sat from the nursing facility with a translator this morning and patient agrees to an amputation if it would save her life.  HPI  Past Medical History:  Diagnosis Date  . Allergy   . Anemia   . Anxiety   . Cellulitis and abscess of foot 05/13/2014   rt foot  . Diabetes mellitus   . Hypertension   . Positive TB test 2013    Patient Active Problem List   Diagnosis Date Noted  . PAD (peripheral artery disease) ([place]) 03/11/2016  . Dysuria 10/24/2014  . Osteomyelitis ([place])   . Cough   . Hx of right BKA ([place]) 05/26/2014  . S/P BKA (below  knee amputation) unilateral ([place])   . Abnormal EKG   . Preop cardiovascular exam 05/21/2014  . Cellulitis of toe of right foot   . Peripheral arterial disease ([place])   . Cellulitis 05/13/2014  . Type 2 diabetes mellitus with complication ([place])   . HLD (hyperlipidemia)   . Pure hypercholesterolemia 11/14/2013  . High risk social situation 11/13/2013  . Back pain without radiation 09/05/2011    Class: Acute  . Osteoarthritis of left hip 05/31/2011  . History of primary TB 04/26/2011  . DM (diabetes mellitus), type 2, uncontrolled ([place]) 05/17/2010  . Essential hypertension 12/09/2009    Past Surgical History:  Procedure Laterality Date  . ABDOMINAL AORTAGRAM N/A 05/20/2014   Procedure: ABDOMINAL Maxcine Ham;  Surgeon: Serafina Mitchell, MD;  Location: [facility] CATH LAB;  Service: Cardiovascular;  Laterality: N/A;  . AMPUTATION Right 05/23/2014   Procedure: AMPUTATION BELOW KNEE;  Surgeon: Mal Misty, MD;  Location: [facility];  Service: Vascular;  Laterality: Right;  . PERIPHERAL VASCULAR CATHETERIZATION N/A 03/11/2016   Procedure: Abdominal Aortogram w/Lower Extremity;  Surgeon: Elam Dutch, MD;  Location: [facility] [place] CV LAB;  Service: Cardiovascular;  Laterality: N/A;  . PERIPHERAL VASCULAR CATHETERIZATION Left 03/11/2016   Procedure: Peripheral Vascular Intervention;  Surgeon: Elam Dutch, MD;  Location: [facility] CV LAB;  Service: Cardiovascular;  Laterality: Left;  Superficial femoral    OB History    No data available  Home Medications    Prior to Admission medications   Medication Sig Start Date End Date Taking? Authorizing Provider  acetaminophen (TYLENOL) 650 MG CR tablet Take 650 mg by mouth every 8 (eight) hours as needed for pain.    [provider]  amLODipine (NORVASC) 10 MG tablet Take 10 mg by mouth daily.    [provider]  amLODipine-atorvastatin (CADUET) 10-40 MG tablet Take 1 tablet by mouth daily.    [provider]  aspirin 81  MG tablet Take 1 tablet (81 mg total) by mouth daily. 01/20/15   Veatrice Bourbon, MD  aspirin 81 MG tablet Take 81 mg by mouth daily.    [provider]  bisacodyl (DULCOLAX) 10 MG suppository Place 10 mg rectally as needed for moderate constipation.    [provider]  Cholecalciferol (VITAMIN D3) 50000 units CAPS Take 50,000 Units by mouth daily.    [provider]  citalopram (CELEXA) 20 MG tablet Take 20 mg by mouth daily.    [provider]  clopidogrel (PLAVIX) 75 MG tablet Take 1 tablet (75 mg total) by mouth daily with breakfast. 03/12/16   Rhyne, Samantha J, PA-C  glipiZIDE (GLUCOTROL XL) 5 MG 24 hr tablet Take 5 mg by mouth daily with breakfast.    [provider]  hydrochlorothiazide (HYDRODIURIL) 25 MG tablet Take 25 mg by mouth daily.    [provider]  hydrochlorothiazide (HYDRODIURIL) 25 MG tablet Take 1 tablet (25 mg total) by mouth daily. 02/27/15   Haney, Yetta Flock A, MD  insulin aspart (NOVOLOG) 100 UNIT/ML injection Inject into the skin 3 (three) times daily before meals.    [provider]  magnesium hydroxide (MILK OF MAGNESIA) 400 MG/5ML suspension Take 30 mLs by mouth daily as needed for mild constipation.    [provider]  metFORMIN (GLUCOPHAGE-XR) 500 MG 24 hr tablet Take 1,000 mg by mouth daily with breakfast.    [provider]  metoprolol tartrate (LOPRESSOR) 25 MG tablet Take 25 mg by mouth 2 (two) times daily.    [provider]  oxyCODONE (OXY IR/ROXICODONE) 5 MG immediate release tablet Take 2.5 mg by mouth every 4 (four) hours as needed for severe pain.    [provider]  Sodium Phosphates (RA SALINE ENEMA) 19-7 GM/118ML ENEM Place rectally daily as needed (severe constipation).    [provider]    Family History Family History  Problem Relation Age of Onset  . Heart disease Unknown        No family history    Social History Social History  Substance  Use Topics  . Smoking status: Never Smoker  . Smokeless tobacco: Never Used  . Alcohol use No     Allergies   Bactrim [sulfamethoxazole-trimethoprim]; Tuberculin ppd; and Ace inhibitors   Review of Systems Review of Systems  Constitutional: Negative for chills and fever.  HENT: Negative for facial swelling and sore throat.   Respiratory: Negative for shortness of breath.   Cardiovascular: Negative for chest pain.  Gastrointestinal: Negative for abdominal pain, nausea and vomiting.  Genitourinary: Negative for dysuria.  Musculoskeletal: Positive for joint swelling. Negative for back pain.  Skin: Positive for wound. Negative for rash.  Neurological: Negative for headaches.  Psychiatric/Behavioral: The patient is not nervous/anxious.      Physical Exam Updated Vital Signs BP 111/89 (BP Location: Right Arm)   Pulse 81   Temp 100.1 F (37.8 C) (Oral)   Resp 16   SpO2 91%  Physical Exam  Constitutional: She appears well-developed and well-nourished. No distress.  HENT:  Head: Normocephalic and atraumatic.  Mouth/Throat: Oropharynx is clear and moist. No oropharyngeal exudate.  Eyes: Conjunctivae are normal. Pupils are equal, round, and reactive to light. Right eye exhibits no discharge. Left eye exhibits no discharge. No scleral icterus.  Neck: Normal range of motion. Neck supple. No thyromegaly present.  Cardiovascular: Normal rate, regular rhythm, normal heart sounds and intact distal pulses.  Exam reveals no gallop and no friction rub.   No murmur heard. Pulmonary/Chest: Effort normal and breath sounds normal. No stridor. No respiratory distress. She has no wheezes. She has no rales.  Abdominal: Soft. Bowel sounds are normal. She exhibits no distension. There is no tenderness. There is no rebound and no guarding.  Musculoskeletal: She exhibits no edema.       Feet:  Left great toe with exposed bone; purulence at the MTP joint; tenderness to base of great toe to the  first metatarsal; sensation intact  Lymphadenopathy:    She has no cervical adenopathy.  Neurological: She is alert. Coordination normal.  Skin: Skin is warm and dry. No rash noted. She is not diaphoretic. No pallor.  Psychiatric: She has a normal mood and affect.  Nursing note and vitals reviewed.    ED Treatments / Results  Labs (all labs ordered are listed, but only abnormal results are displayed) Labs Reviewed  BASIC METABOLIC PANEL - Abnormal; Notable for the following:       Result Value   Sodium 134 (*)    Potassium 3.1 (*)    Chloride 93 (*)    Glucose, Bld 225 (*)    Calcium 8.7 (*)    All other components within normal limits  CBC WITH DIFFERENTIAL/PLATELET - Abnormal; Notable for the following:    WBC 24.9 (*)    RBC 3.86 (*)    Hemoglobin 9.7 (*)    HCT 29.7 (*)    MCV 76.9 (*)    MCH 25.1 (*)    Platelets 465 (*)    Neutro Abs 19.9 (*)    Monocytes Absolute 1.5 (*)    All other components within normal limits  CULTURE, BLOOD (ROUTINE X 2)  CULTURE, BLOOD (ROUTINE X 2)  URINALYSIS, ROUTINE W REFLEX MICROSCOPIC  PROTIME-INR  BASIC METABOLIC PANEL  CBC  I-STAT CG4 LACTIC ACID, ED    EKG  EKG Interpretation None       Radiology Dg Foot Complete Left  Result Date: 08/08/2016 CLINICAL DATA:  Osteomyelitis EXAM: LEFT FOOT - COMPLETE 3+ VIEW COMPARISON:  None. FINDINGS: Frontal, oblique, and lateral views obtained. There is extensive soft tissue injury involving the first digit with the first distal phalanx apparently exposed to air. There is bony destruction involving portions of the proximal aspect of the first distal phalanx as well as portions of the proximal and distal aspects of the first proximal phalanx from both medial and lateral aspects. There is also a focus of bony destruction in the distal most aspect of the first MTP joint. Bones are diffusely osteoporotic. There is a fracture along the proximal aspect of the first proximal phalanx laterally  with associated bony destruction. No other fracture. No dislocation. Joint spaces appear unremarkable. There is generalized soft tissue swelling of the foot. IMPRESSION: Loss of soft tissue in the region of first distal phalanx. Areas of osteomyelitis involving portions of the first proximal and distal phalanges as well as along the distal most aspect of the first MTP  joint in the midline. Fracture along the lateral aspect of the proximal portion of the first proximal phalanx in area of osteomyelitis. Bones diffusely osteoporotic. No other fracture elsewhere. No dislocation. No other areas of bony destruction. Electronically Signed   By: Lowella Grip III M.D.   On: 08/08/2016 17:07    Procedures Procedures (including critical care time)  Medications Ordered in ED Medications  acetaminophen (TYLENOL) tablet 650 mg (not administered)  sodium chloride 0.9 % bolus 905 mL (not administered)  cefUROXime (ZINACEF) 1.5 g in dextrose 5 % 50 mL IVPB (not administered)  cefUROXime (ZINACEF) 1.5 g in dextrose 5 % 50 mL IVPB (not administered)  sodium chloride 0.9 % bolus 1,000 mL (1,000 mLs Intravenous New Bag/Given 08/08/16 1709)  piperacillin-tazobactam (ZOSYN) IVPB 3.375 g (0 g Intravenous Stopped 08/08/16 1757)  0.9 % NaCl with KCl 20 mEq/ L  infusion (125 mL/hr Intravenous New Bag/Given 08/08/16 1830)     Initial Impression / Assessment and Plan / ED Course  I have reviewed the triage vital signs and the nursing notes.  Pertinent labs & imaging results that were available during my care of the patient were reviewed by me and considered in my medical decision making (see chart for details).     Dr. Jimmye Norman contact info at [facility]: office [phone], cell [phone] ( she said to contact her about any questions, she knows the patient very well)  Patient with osteomyelitis of left great toe (Loss of soft tissue in the region of first distal phalanx. Areas of osteomyelitis involving portions of  the first proximal and distal phalanges as well as along the distal most aspect of the first MTP joint in the midline. Fracture along the lateral aspect of the proximal portion of the first proximal phalanx in area of osteomyelitis.). Patient afebrile in the ED. Tylenol given. Considering patient's elevated WBC count at 24.9K, blood cultures pending. Lactate 1.52. Patient given weight-based fluid bolus in the ED. Zosyn initiated. I spoke with vascular surgeon, Dr. Donzetta Matters, who will consult on the patient and follow patient throughout admission. He advises probable amputation. I spoke with the internal medicine teaching service who will get the patient for further evaluation and treatment. Patient also evaluated by Dr. Ralene Bathe who guided the patient's management and agrees with plan.  Final Clinical Impressions(s) / ED Diagnoses   Final diagnoses:  Other chronic osteomyelitis of left foot [facility])    New Prescriptions New Prescriptions   No medications on file     Caryl Ada 08/08/16 1925    Quintella Reichert, MD 08/10/16 1115

## 2016-08-08 NOTE — ED Notes (Signed)
Pt on way to XR

## 2016-08-08 NOTE — ED Notes (Signed)
Call Dr. Jimmye Norman at [phone] for medical history. Pt is from [place]. Speaks Arabic dialect.

## 2016-08-08 NOTE — Progress Notes (Signed)
Pharmacy Antibiotic Note  Kimberly Lane is a 56 y.o. female admitted on 08/08/2016 with  increased L toe pain, elevated WBC, and fever.  Pt has chronic L toe gangrene/osteo being medically managed.  Pt now amenable to amputation.  Pharmacy has been consulted for Vancomycin dosing.  Rec'd Zosyn in ED 6/18 at 1718  Plan: Vancomycin 750 mg IV every 12 hours.  Goal trough 15-20 mcg/mL.  Weight: 140 lb 3.4 oz (63.6 kg)  Temp (24hrs), Avg:99.8 F (37.7 C), Min:99.6 F (37.6 C), Max:100.1 F (37.8 C)   Recent Labs Lab 08/08/16 1650 08/08/16 1705  WBC 24.9*  --   CREATININE 0.91  --   LATICACIDVEN  --  1.52    Estimated Creatinine Clearance: 60.3 mL/min (by C-G formula based on SCr of 0.91 mg/dL).    Allergies  Allergen Reactions  . Bactrim [Sulfamethoxazole-Trimethoprim] Nausea And Vomiting    Pt and family verified that pt takes this medication  . Tuberculin Ppd   . Ace Inhibitors Cough    cough    Antimicrobials this admission: Vanc 6/18 >> Zosyn 6/18 x 1  Dose adjustments this admission:   Microbiology results: 6/18 BCx >>  Thank you for allowing pharmacy to be a part of this patient's care.  [employer], Pharm.D., BCPS Clinical Pharmacist Pager: [phone] 08/08/2016 10:29 PM

## 2016-08-08 NOTE — H&P (Signed)
Date: 08/08/2016               Patient Name:  Kimberly Lane MRN: [id number]  DOB: 03-02-1960 Age / Sex: 55 y.o., female   PCP: Angelica Pou, MD         Medical Service: Internal Medicine Teaching Service         Attending Physician: Dr. Lucious Groves, DO    First Contact: Dr. Hetty Ely Pager: [phone]  Second Contact: Dr. Juleen China Pager: [phone]       After Hours (After 5p/  First Contact Pager: [phone]  weekends / holidays): Second Contact Pager: [phone]   Chief Complaint: Left toe pain  History of Present Illness: Patient is a 56 year old [name] speaking woman with a pmhx significant for HTN, HLD, PAD (s/p right BKA and left superficial femoral artery stenting 04/12/16), uncontrolled DM (A1c 12.0 on 03/24/16) and chronic left great toe gangrene presenting with worsening left toe pain and systemic symptoms of fever, weakness, poor appetite, and dizziness. History was obtained via chart review and with the help of a translator. Patient follows with Dr. Jimmye Norman at [facility] [place] who has been managing her left toe gangrene medically as her previous goals have been non operative. She was offered left first toe amputation per [facility] in February but refused. At a prior PCP visit on June 11th, she was experiencing complications of cellulitis extending up the left lower leg.  She was also noted to have approximately 0.5cm of exposed bone and her distal soft tissue of her left toe had sloughed off. She was given a prescription for Augmentin 500/125 BID and oxycodone prn. She followed up with Dr. Jimmye Norman today and unfortunately had developed systemic symptoms and appeared unwell. She had a long discussion with her husband and Dr. Jimmye Norman regarding management options including continuing to treat medically with the understanding that her illness may progress and ultimately lead to death vs the option of hospitalization and amputation for definitive therapy. Patient ultimately chose hospitalization  and is now in agreement with surgical management.   On arrival to the ED, she was afebrile T 100.1 and hemodynamically stable with BP 111/89, HR 81, RR 16, and oxygen 91% on RA. CBC notable for for a leukocytosis of 24.9 and hemoglobin of 9.7 (at baseline). BMP with hypokalemia 3.1 and normal renal function, creatinine 0.91. Lactic acid 1.52. UA pending on admission. Blood cultures were collected. Plain films of her left food were consistent with osteomyelitis of the first distal phalanx. She received IV Zosyn x 1 and 1L NS bolus.   Meds:  No outpatient prescriptions have been marked as taking for the 08/08/16 encounter [facility] Encounter).     Allergies: Allergies as of 08/08/2016 - Review Complete 08/08/2016  Allergen Reaction Noted  . Bactrim [sulfamethoxazole-trimethoprim] Nausea And Vomiting 05/13/2014  . Tuberculin ppd  03/11/2016  . Ace inhibitors Cough 01/15/2014   Past Medical History:  Diagnosis Date  . Allergy   . Anemia   . Anxiety   . Cellulitis and abscess of foot 05/13/2014   rt foot  . Diabetes mellitus   . Hypertension   . Positive TB test 2013    Family History: Denied significant family history but limited due to language barrier  Social History: Speaks arabic/sudanese. Unable to obtain full social history due to language barrier. Denied alcohol and tobacco use.   Review of Systems: A complete ROS was negative except as per HPI.   Physical Exam: Blood  pressure 111/89, pulse 81, temperature 100.1 F (37.8 C), temperature source Oral, resp. rate 16, SpO2 91 %. Constitutional: NAD, appears comfortable HEENT: Atraumatic, normocephalic. anicteric sclera.   Cardiovascular: RRR, 3/6 systolic ejection murmur heard best over LUSB Pulmonary/Chest: Limited due to effort but CTAB, no wheezes, rales, or rhonchi.   Abdominal: Soft, non tender, non distended. +BS.  Extremities: Right BKA stump well healed, left lower extremity warm to touch but distal pulses non  palpable, left great toe with necrotic changes and exposed bone Neurological: A&Ox3, CN II - XII grossly intact.  Skin: No rashes or erythema  Psychiatric: Normal mood and affect        EKG: Personally reviewed. Sinus rhythm, T wave inversions in lateral leads, unchanged from prior tracing  DG Foot Left Complete:  IMPRESSION: Loss of soft tissue in the region of first distal phalanx. Areas of osteomyelitis involving portions of the first proximal and distal phalanges as well as along the distal most aspect of the first MTP joint in the midline. Fracture along the lateral aspect of the proximal portion of the first proximal phalanx in area of osteomyelitis.  Bones diffusely osteoporotic. No other fracture elsewhere. No dislocation. No other areas of bony destruction.  Assessment & Plan by Problem:  Patient is a 56 year old [name] speaking woman with a pmhx significant for HTN, HLD, PAD, uncontrolled DM presenting with left toe osteomyelitis and systemic symptoms of fever, weakness, poor appetite, and dizziness admitted for IV antibiotics and surgical management.   Left Toe Osteomyelitis: Chronic and progressive in the setting of uncontrolled DM and PVD. Previously managed medically by her PCP as prior goals of care were non operative. She is now experiencing systemic symptoms and would like to proceed with amputation. She is febrile to 100.1 with a leukocytosis of 24. Recently completed a course of PO Augmentin with no improvement in her symptoms. [facility] was consulted per EDP and planning for left great toe amputation in AM. S/p IV Zosyn x 1 and 1L NS bolus in ED.  -- f/u Blood Cx  -- S/p IV Zosyn x 1 in ED -- IV Vanc now for MRSA coverage  -- NPO at midnight  -- Norco 5-325 1-2 tabs q4 hours prn  -- IV Dilaudid 1 mg q4 hours prn  -- Senna and Colace prn  -- NS 75 cc/hr while NPO   Hypokalemia: Potassium 3.1 on admission, s/p 20 mEq in ED  -- Will give additional 40 mEq BID x 2  doses  -- F/u AM BMP   HTN: Normotensive on admission, reports compliance but is unsure what medicines she takes -- Hold home antihypertensives for now, resume post op   DM: Uncontrolled, last A1c 12.0  -- Hold home metformin and sulfonylurea  -- SSI - sensitive TID with meals  PVD: S/p right BKA and left SFA stenting  -- Hold ASA and plavix for surgery   Depression: -- Continue home Celexa 20 mg   FEN: 75 cc/hr while NPO, repleting K , NPO at midnight  VTE ppx: Lovenox  Code Status: FULL   Dispo: Admit patient to Inpatient with expected length of stay greater than 2 midnights.  SignedVelna Ochs, MD 08/08/2016, 6:58 PM  Pager: [phone]

## 2016-08-09 ENCOUNTER — Encounter (HOSPITAL_COMMUNITY): Payer: Self-pay | Admitting: Anesthesiology

## 2016-08-09 ENCOUNTER — Inpatient Hospital Stay (HOSPITAL_COMMUNITY): Payer: No Typology Code available for payment source | Admitting: Anesthesiology

## 2016-08-09 ENCOUNTER — Encounter (HOSPITAL_COMMUNITY)
Admission: EM | Disposition: A | Discharge status: Rehabilitation Facility | Payer: Self-pay | Source: Home / Self Care | Attending: Internal Medicine

## 2016-08-09 DIAGNOSIS — M86672 Other chronic osteomyelitis, left ankle and foot: Secondary | ICD-10-CM

## 2016-08-09 DIAGNOSIS — Z888 Allergy status to other drugs, medicaments and biological substances status: Secondary | ICD-10-CM

## 2016-08-09 DIAGNOSIS — E1152 Type 2 diabetes mellitus with diabetic peripheral angiopathy with gangrene: Secondary | ICD-10-CM

## 2016-08-09 DIAGNOSIS — Z95828 Presence of other vascular implants and grafts: Secondary | ICD-10-CM

## 2016-08-09 DIAGNOSIS — I739 Peripheral vascular disease, unspecified: Secondary | ICD-10-CM | POA: Diagnosis present

## 2016-08-09 DIAGNOSIS — Z882 Allergy status to sulfonamides status: Secondary | ICD-10-CM

## 2016-08-09 HISTORY — PX: AMPUTATION: SHX166

## 2016-08-09 LAB — GLUCOSE, CAPILLARY
GLUCOSE-CAPILLARY: 181 mg/dL — AB (ref 65–99)
GLUCOSE-CAPILLARY: 219 mg/dL — AB (ref 65–99)
GLUCOSE-CAPILLARY: 273 mg/dL — AB (ref 65–99)
GLUCOSE-CAPILLARY: 327 mg/dL — AB (ref 65–99)
Glucose-Capillary: 199 mg/dL — ABNORMAL HIGH (ref 65–99)
Glucose-Capillary: 268 mg/dL — ABNORMAL HIGH (ref 65–99)
Glucose-Capillary: 197 mg/dL — ABNORMAL HIGH (ref 65–99)

## 2016-08-09 LAB — CBC
HCT: 28.7 % — ABNORMAL LOW (ref 36.0–46.0)
Hemoglobin: 9.4 g/dL — ABNORMAL LOW (ref 12.0–15.0)
MCH: 25.3 pg — ABNORMAL LOW (ref 26.0–34.0)
MCHC: 32.8 g/dL (ref 30.0–36.0)
MCV: 77.4 fL — ABNORMAL LOW (ref 78.0–100.0)
PLATELETS: 440 10*3/uL — AB (ref 150–400)
RBC: 3.71 MIL/uL — AB (ref 3.87–5.11)
RDW: 14 % (ref 11.5–15.5)
WBC: 23.5 10*3/uL — ABNORMAL HIGH (ref 4.0–10.5)

## 2016-08-09 LAB — COMPREHENSIVE METABOLIC PANEL
ALT: 55 U/L — AB (ref 14–54)
AST: 138 U/L — ABNORMAL HIGH (ref 15–41)
Albumin: 1.5 g/dL — ABNORMAL LOW (ref 3.5–5.0)
Alkaline Phosphatase: 157 U/L — ABNORMAL HIGH (ref 38–126)
Anion gap: 13 (ref 5–15)
BILIRUBIN TOTAL: 0.8 mg/dL (ref 0.3–1.2)
BUN: 12 mg/dL (ref 6–20)
CHLORIDE: 102 mmol/L (ref 101–111)
CO2: 22 mmol/L (ref 22–32)
CREATININE: 0.86 mg/dL (ref 0.44–1.00)
Calcium: 7.8 mg/dL — ABNORMAL LOW (ref 8.9–10.3)
GFR calc Af Amer: >60 mL/min (ref >60)
Glucose, Bld: 219 mg/dL — ABNORMAL HIGH (ref 65–99)
Potassium: 3.3 mmol/L — ABNORMAL LOW (ref 3.5–5.1)
Sodium: 137 mmol/L (ref 135–145)
TOTAL PROTEIN: 6.9 g/dL (ref 6.5–8.1)

## 2016-08-09 LAB — PROTIME-INR
INR: 1.26
Prothrombin Time: 15.8 seconds — ABNORMAL HIGH (ref 11.4–15.2)

## 2016-08-09 LAB — MRSA PCR SCREENING: MRSA BY PCR: NEGATIVE

## 2016-08-09 LAB — MAGNESIUM: MAGNESIUM: 1.4 mg/dL — AB (ref 1.7–2.4)

## 2016-08-09 LAB — HIV ANTIBODY (ROUTINE TESTING W REFLEX): HIV SCREEN 4TH GENERATION: NONREACTIVE

## 2016-08-09 SURGERY — AMPUTATION DIGIT
Anesthesia: General | Site: Foot | Laterality: Left

## 2016-08-09 MED ORDER — LACTATED RINGERS IV SOLN
INTRAVENOUS | Status: DC
  Administered 2016-08-09 (×2): via INTRAVENOUS

## 2016-08-09 MED ORDER — ONDANSETRON HCL 4 MG/2ML IJ SOLN
INTRAMUSCULAR | Status: DC | PRN
  Administered 2016-08-09: 4 mg via INTRAVENOUS

## 2016-08-09 MED ORDER — ONDANSETRON HCL 4 MG/2ML IJ SOLN
INTRAMUSCULAR | Status: AC
  Filled 2016-08-09: qty 2

## 2016-08-09 MED ORDER — LIDOCAINE HCL (CARDIAC) 10 MG/ML IV SOLN
INTRAVENOUS | Status: DC | PRN
  Administered 2016-08-09: 60 mg via INTRAVENOUS

## 2016-08-09 MED ORDER — 0.9 % SODIUM CHLORIDE (POUR BTL) OPTIME
TOPICAL | Status: DC | PRN
  Administered 2016-08-09: 1000 mL

## 2016-08-09 MED ORDER — INSULIN GLARGINE 100 UNIT/ML ~~LOC~~ SOLN
10.0000 [IU] | Freq: Every day | SUBCUTANEOUS | Status: DC
  Administered 2016-08-09: 10 [IU] via SUBCUTANEOUS
  Filled 2016-08-09: qty 0.1

## 2016-08-09 MED ORDER — ONDANSETRON HCL 4 MG/2ML IJ SOLN: 4.0000 mg | Freq: Three times a day (TID) | INTRAMUSCULAR | Status: DC | PRN

## 2016-08-09 MED ORDER — FENTANYL CITRATE (PF) 250 MCG/5ML IJ SOLN
INTRAMUSCULAR | Status: AC
  Filled 2016-08-09: qty 5

## 2016-08-09 MED ORDER — LIDOCAINE-EPINEPHRINE (PF) 1 %-1:200000 IJ SOLN
INTRAMUSCULAR | Status: AC
  Filled 2016-08-09: qty 30

## 2016-08-09 MED ORDER — PROPOFOL 10 MG/ML IV BOLUS
INTRAVENOUS | Status: DC | PRN
  Administered 2016-08-09 (×2): 50 mg via INTRAVENOUS
  Administered 2016-08-09: 100 mg via INTRAVENOUS

## 2016-08-09 MED ORDER — MIDAZOLAM HCL 2 MG/2ML IJ SOLN
INTRAMUSCULAR | Status: AC
Start: 2016-08-09 — End: 2016-08-09
  Filled 2016-08-09: qty 2

## 2016-08-09 MED ORDER — POTASSIUM CHLORIDE CRYS ER 20 MEQ PO TBCR
40.0000 meq | EXTENDED_RELEASE_TABLET | Freq: Once | ORAL | Status: AC
  Administered 2016-08-11: 40 meq via ORAL
  Filled 2016-08-09: qty 2

## 2016-08-09 MED ORDER — MAGNESIUM SULFATE 2 GM/50ML IV SOLN
2.0000 g | Freq: Once | INTRAVENOUS | Status: AC
  Administered 2016-08-09: 2 g via INTRAVENOUS
  Filled 2016-08-09: qty 50

## 2016-08-09 MED ORDER — HYDROCODONE-ACETAMINOPHEN 5-325 MG PO TABS
1.0000 | ORAL_TABLET | ORAL | Status: DC
  Administered 2016-08-09: 1 via ORAL
  Administered 2016-08-10 – 2016-08-11 (×7): 2 via ORAL
  Administered 2016-08-12: 1 via ORAL
  Administered 2016-08-12 (×2): 2 via ORAL
  Administered 2016-08-12: 1 via ORAL
  Administered 2016-08-12 (×2): 2 via ORAL
  Filled 2016-08-09 (×16): qty 2

## 2016-08-09 MED ORDER — METRONIDAZOLE IN NACL 5-0.79 MG/ML-% IV SOLN
500.0000 mg | Freq: Three times a day (TID) | INTRAVENOUS | Status: DC
  Administered 2016-08-09 – 2016-08-15 (×18): 500 mg via INTRAVENOUS
  Filled 2016-08-09 (×18): qty 100

## 2016-08-09 MED ORDER — FENTANYL CITRATE (PF) 100 MCG/2ML IJ SOLN: 25.0000 ug | INTRAMUSCULAR | Status: DC | PRN

## 2016-08-09 MED ORDER — DEXTROSE 5 % IV SOLN
2.0000 g | INTRAVENOUS | Status: DC
  Administered 2016-08-09 – 2016-08-16 (×7): 2 g via INTRAVENOUS
  Filled 2016-08-09 (×9): qty 2

## 2016-08-09 MED ORDER — PROPOFOL 10 MG/ML IV BOLUS
INTRAVENOUS | Status: AC
  Filled 2016-08-09: qty 20

## 2016-08-09 MED ORDER — SODIUM CHLORIDE 0.9 % IV SOLN: INTRAVENOUS | Status: DC

## 2016-08-09 MED ORDER — FENTANYL CITRATE (PF) 100 MCG/2ML IJ SOLN
INTRAMUSCULAR | Status: DC | PRN
Start: 2016-08-09 — End: 2016-08-09
  Administered 2016-08-09: 50 ug via INTRAVENOUS
  Administered 2016-08-09: 25 ug via INTRAVENOUS
  Administered 2016-08-09: 50 ug via INTRAVENOUS
  Administered 2016-08-09: 25 ug via INTRAVENOUS

## 2016-08-09 MED ORDER — LIDOCAINE 2% (20 MG/ML) 5 ML SYRINGE
INTRAMUSCULAR | Status: AC
  Filled 2016-08-09: qty 5

## 2016-08-09 SURGICAL SUPPLY — 34 items
BANDAGE ACE 4X5 VEL STRL LF (GAUZE/BANDAGES/DRESSINGS) ×3 IMPLANT
BANDAGE ELASTIC 4 VELCRO ST LF (GAUZE/BANDAGES/DRESSINGS) ×3 IMPLANT
BLADE AVERAGE 25MMX9MM (BLADE) ×2
BLADE AVERAGE 25X9 (BLADE) ×4 IMPLANT
BLADE SAW SGTL 81X20 HD (BLADE) IMPLANT
BNDG CONFORM 3 STRL LF (GAUZE/BANDAGES/DRESSINGS) IMPLANT
BNDG GAUZE ELAST 4 BULKY (GAUZE/BANDAGES/DRESSINGS) ×6 IMPLANT
CANISTER SUCT 3000ML PPV (MISCELLANEOUS) ×3 IMPLANT
COVER SURGICAL LIGHT HANDLE (MISCELLANEOUS) ×3 IMPLANT
DRAPE EXTREMITY T 121X128X90 (DRAPE) ×3 IMPLANT
DRAPE HALF SHEET 40X57 (DRAPES) ×3 IMPLANT
ELECT REM PT RETURN 9FT ADLT (ELECTROSURGICAL) ×3
ELECTRODE REM PT RTRN 9FT ADLT (ELECTROSURGICAL) ×1 IMPLANT
GAUZE SPONGE 4X4 12PLY STRL (GAUZE/BANDAGES/DRESSINGS) ×3 IMPLANT
GAUZE SPONGE 4X4 12PLY STRL LF (GAUZE/BANDAGES/DRESSINGS) ×3 IMPLANT
GLOVE BIO SURGEON STRL SZ7.5 (GLOVE) ×3 IMPLANT
GOWN STRL REUS W/ TWL LRG LVL3 (GOWN DISPOSABLE) ×2 IMPLANT
GOWN STRL REUS W/ TWL XL LVL3 (GOWN DISPOSABLE) ×1 IMPLANT
GOWN STRL REUS W/TWL LRG LVL3 (GOWN DISPOSABLE) ×4
GOWN STRL REUS W/TWL XL LVL3 (GOWN DISPOSABLE) ×2
KIT BASIN OR (CUSTOM PROCEDURE TRAY) ×3 IMPLANT
KIT ROOM TURNOVER OR (KITS) ×3 IMPLANT
NEEDLE HYPO 25GX1X1/2 BEV (NEEDLE) IMPLANT
NS IRRIG 1000ML POUR BTL (IV SOLUTION) ×3 IMPLANT
PACK GENERAL/GYN (CUSTOM PROCEDURE TRAY) ×3 IMPLANT
PAD ARMBOARD 7.5X6 YLW CONV (MISCELLANEOUS) ×6 IMPLANT
SPECIMEN JAR SMALL (MISCELLANEOUS) ×3 IMPLANT
SUT ETHILON 3 0 PS 1 (SUTURE) ×3 IMPLANT
SWAB CULTURE ESWAB REG 1ML (MISCELLANEOUS) IMPLANT
SYR CONTROL 10ML LL (SYRINGE) IMPLANT
TOWEL OR 17X24 6PK STRL BLUE (TOWEL DISPOSABLE) ×3 IMPLANT
TOWEL OR 17X26 10 PK STRL BLUE (TOWEL DISPOSABLE) ×3 IMPLANT
UNDERPAD 30X30 (UNDERPADS AND DIAPERS) ×3 IMPLANT
WATER STERILE IRR 1000ML POUR (IV SOLUTION) ×3 IMPLANT

## 2016-08-09 NOTE — Anesthesia Postprocedure Evaluation (Signed)
Anesthesia Post Note  Patient: Kimberly Lane  Procedure(s) Performed: Procedure(s) (LRB): LEFT GREAT TOE AMPUTATION (Left)     Patient location during evaluation: PACU Anesthesia Type: General Level of consciousness: awake and alert Pain management: pain level controlled Vital Signs Assessment: post-procedure vital signs reviewed and stable Respiratory status: spontaneous breathing, nonlabored ventilation, respiratory function stable and patient connected to nasal cannula oxygen Cardiovascular status: blood pressure returned to baseline and stable Postop Assessment: no signs of nausea or vomiting Anesthetic complications: no    Last Vitals:  Vitals:   08/09/16 1025 08/09/16 1030  BP: (!) 119/59   Pulse: 81 81  Resp: (!) 21 19  Temp:  36.7 C    Last Pain:  Vitals:   08/09/16 0440  TempSrc: Oral                 Flint Hakeem,W. EDMOND

## 2016-08-09 NOTE — Progress Notes (Signed)
   Subjective:  I saw Ms. Plasse far after her procedure this afternoon. She reports pain is uncontrolled by PRN pain medications. She is on 3 L nasal cannula and seems to be holding her breath at times and starts to desaturate to SPO2 85%. With encouragement to take deep breaths her oxygen saturation improved to the mid 90s. Multiple family members are in her room and they are updated on the results of her surgery.  Objective:  Vital signs in last 24 hours: Vitals:   08/09/16 1025 08/09/16 1030 08/09/16 1050 08/09/16 1300  BP: (!) 119/59  136/65 134/64  Pulse: 81 81 70 74  Resp: (!) 21 19 20 19   Temp:  98 F (36.7 C) 98.6 F (37 C) 98.6 F (37 C)  TempSrc:   Oral Oral  SpO2: 91% 92% 93% 94%  Weight:       General Apperance: NAD HEENT: Normocephalic, atraumatic, anicteric sclera Neck: Supple, trachea midline Lungs: Clear to auscultation bilaterally. No wheezes, rhonchi or rales Heart: Regular rate and rhythm, no murmur/rub/gallop Abdomen: Soft, nontender, nondistended, no rebound/guarding Extremities: Warm and well perfused, R BKA, LLE in dressing Skin: No rashes Neurologic: Alert and interactive. No gross deficits.   Assessment/Plan: 56 year old [name] speaking woman with HTN, HLD, PAD, uncontrolled DM presenting with left toe osteomyelitis and systemic symptoms of fever, weakness, poor appetite, and dizziness admitted for IV antibiotics and surgical management.   Left Toe Osteomyelitis: Underwent L great toe amputation. Will need L BKA.   -- f/u Blood Cx  -- Cont vanc, ceftriaxone and flagyl  -- Norco 5-325 1-2 tabs q4 hours scheduled   -- IV Dilaudid 1 mg q4 hours prn  -- Senna and Colace prn   Hypokalemia: Potassium 3.1 on admission, s/p 40 mEq in ED. 3.3 this morning. Checking mg. Replete both prn. - ordered K-dur 40 meq  - F/U BMP tomorrow morning   HTN: Normotensive  -- Hold home antihypertensives for now, can restart home medication amlodipine or  hydrochlorothiazide if needed  DM: Uncontrolled, last A1c 12.0  -- Hold home metformin and sulfonylurea  -- SSI - sensitive TID with meals -Lantus 12 units   PVD: S/p right BKA and left SFA stenting  -- Restart ASA and plavix when able   Depression: -- Continue home Celexa 20 mg   Low albumin Visibly has muscle wasting on exam Follow-up pre-albumin Consult to nutrition  FEN: clears, LR@50  VTE ppx: Lovenox  Code Status: FULL   Dispo: Anticipated discharge in approximately 2-3 day(s).   Ledell Noss, MD  08/09/2016, 5:55 PM Pager: [phone]

## 2016-08-09 NOTE — Anesthesia Procedure Notes (Signed)
Procedure Name: LMA Insertion Date/Time: 08/09/2016 8:59 AM Performed by: Scheryl Darter Pre-anesthesia Checklist: Patient identified, Emergency Drugs available, Suction available and Patient being monitored Patient Re-evaluated:Patient Re-evaluated prior to inductionOxygen Delivery Method: Circle System Utilized Preoxygenation: Pre-oxygenation with 100% oxygen Intubation Type: IV induction Ventilation: Mask ventilation without difficulty LMA: LMA inserted LMA Size: 4.0 Number of attempts: 1 Airway Equipment and Method: Bite block Placement Confirmation: positive ETCO2 Tube secured with: Tape Dental Injury: Teeth and Oropharynx as per pre-operative assessment

## 2016-08-09 NOTE — Op Note (Signed)
    Patient name: Makynzee Tigges MRN: [id number] DOB: 09-21-1960 Sex: female  08/08/2016 - 08/09/2016 Pre-operative Diagnosis: gangrene of left great toe Post-operative diagnosis:  Same with extension into proximal foot Surgeon:  Eda Paschal. Donzetta Matters, MD Assistant: OR nurse Procedure Performed: Open left great toe amputation  Indications:  56 year old female history of left SFA stenting that is patent by physical exam. She has progressive gangrene of her left great toe with possible extension into her proximal foot and is indicated for toe amputation for infection control.  Findings: There is significant pus deep to the left great toe. The necrosis extends proximal to the foot and patient will need below-knee amputation.   Procedure:  The patient was identified in the holding area and taken to the operating room where she is placed supine on the operating table and general anesthesia was induced. She was sterilely prepped and draped in the left foot and ankle in usual fashion timeout called. She is artery on antibiotics. Began with tennis racket type incision around the first toe second with knife down to the metatarsophalangeal joint and removed the toe as specimen. There was significant pus encountered in both the joint space and deep on the plantar aspect of the foot. We debrided back to necrotic tissue but ultimately this tracked back into her proximal foot. Specimens were sent for culture. We obtained hemostasis irrigated the wound copiously and then placed Betadine packing in the wound and left the wound open. Patient was allowed awaken from anesthesia having tolerated the procedure well without immediate complication. All counts were correct at completion.  Specimen: aerobic and anaerobic culture.    Alexianna Nachreiner C. Donzetta Matters, MD [facility] [place] Office: [phone] Pager: [phone]

## 2016-08-09 NOTE — Consult Note (Addendum)
WOC consult requested prior to vascular service involvement; they are now following for assessment and plan of care and plan for toe amputation surgery today according to the EMR.  Please refer to their team for further questions regarding plan of care. Please re-consult if further assistance is needed.  Thank-you,  Julien Girt MSN, [place], [place], [place], [place]

## 2016-08-09 NOTE — Progress Notes (Signed)
Inpatient Diabetes Program Recommendations  AACE/ADA: New Consensus Statement on Inpatient Glycemic Control (2015)  Target Ranges:  Prepandial:   less than 140 mg/dL      Peak postprandial:   less than 180 mg/dL (1-2 hours)      Critically ill patients:  140 - 180 mg/dL   Lab Results  Component Value Date   GLUCAP 273 (H) 08/09/2016   HGBA1C 11.0 01/20/2015    Review of Glycemic Control Results for [place], [facility] (MRN [id number]) as of 08/09/2016 14:15  Ref. Range 08/09/2016 03:53 08/09/2016 08:20 08/09/2016 09:57 08/09/2016 10:53  Glucose-Capillary Latest Ref Range: 65 - 99 mg/dL 199 (H) 219 (H) 268 (H) 273 (H)   Diabetes history: DM2 Outpatient Diabetes medications: Glucotrol 7.5 mg qd + Metformin 1 gm qd Current orders for Inpatient glycemic control: Novolog 0-9 units tid  Inpatient Diabetes Program Recommendations:  While oral medications held, please consider: -Lantus 12 units daily (approx. 0.2 units/kg x 63.6 kg) -A1c to determine prehospital glycemic control  Thank you, Nani Gasser. Jaynee Winters, RN, MSN, CDE  Diabetes Coordinator Inpatient Glycemic Control Team Team Pager [phone] (8am-5pm) 08/09/2016 2:20 PM

## 2016-08-09 NOTE — Anesthesia Preprocedure Evaluation (Addendum)
Anesthesia Evaluation  Patient identified by MRN, date of birth, ID band Patient awake    Reviewed: Allergy & Precautions, H&P , NPO status , Patient's Chart, lab work & pertinent test results, reviewed documented beta blocker date and time   Airway Mallampati: II  TM Distance: >3 FB Neck ROM: Full    Dental no notable dental hx. (+) Teeth Intact, Dental Advisory Given   Pulmonary neg pulmonary ROS,    Pulmonary exam normal breath sounds clear to auscultation       Cardiovascular hypertension, Pt. on medications and Pt. on home beta blockers + Peripheral Vascular Disease   Rhythm:Regular Rate:Normal     Neuro/Psych Anxiety negative neurological ROS  negative psych ROS   GI/Hepatic negative GI ROS, Neg liver ROS,   Endo/Other  diabetes, Insulin Dependent  Renal/GU negative Renal ROS  negative genitourinary   Musculoskeletal  (+) Arthritis ,   Abdominal   Peds  Hematology negative hematology ROS (+) anemia ,   Anesthesia Other Findings   Reproductive/Obstetrics negative OB ROS                            Anesthesia Physical Anesthesia Plan  ASA: III  Anesthesia Plan: General   Post-op Pain Management:    Induction: Intravenous  PONV Risk Score and Plan: 3 and Ondansetron, Dexamethasone, Propofol and Midazolam  Airway Management Planned: LMA  Additional Equipment:   Intra-op Plan:   Post-operative Plan: Extubation in OR  Informed Consent: I have reviewed the patients History and Physical, chart, labs and discussed the procedure including the risks, benefits and alternatives for the proposed anesthesia with the patient or authorized representative who has indicated his/her understanding and acceptance.   Dental advisory given  Plan Discussed with: CRNA  Anesthesia Plan Comments:        Anesthesia Quick Evaluation

## 2016-08-09 NOTE — Transfer of Care (Signed)
Immediate Anesthesia Transfer of Care Note  Patient: Kimberly Lane  Procedure(s) Performed: Procedure(s): LEFT GREAT TOE AMPUTATION (Left)  Patient Location: PACU  Anesthesia Type:General  Level of Consciousness: awake, alert , oriented and sedated  Airway & Oxygen Therapy: Patient Spontanous Breathing and Patient connected to nasal cannula oxygen  Post-op Assessment: Report given to RN, Post -op Vital signs reviewed and stable and Patient moving all extremities  Post vital signs: Reviewed and stable  Last Vitals:  Vitals:   08/09/16 0952 08/09/16 0955  BP:  121/61  Pulse: 74 77  Resp: (!) 24 (!) 39  Temp: 37.4 C     Last Pain:  Vitals:   08/09/16 0440  TempSrc: Oral         Complications: No apparent anesthesia complications

## 2016-08-09 NOTE — Progress Notes (Signed)
Pt nods and smiles when asked if she is ok then returns to sleep

## 2016-08-10 ENCOUNTER — Encounter (HOSPITAL_COMMUNITY): Payer: Self-pay | Admitting: Vascular Surgery

## 2016-08-10 ENCOUNTER — Inpatient Hospital Stay (HOSPITAL_COMMUNITY): Payer: No Typology Code available for payment source

## 2016-08-10 LAB — CBC WITH DIFFERENTIAL/PLATELET
BASOS PCT: 0 %
Basophils Absolute: 0 10*3/uL (ref 0.0–0.1)
EOS PCT: 0 %
Eosinophils Absolute: 0 10*3/uL (ref 0.0–0.7)
HCT: 24.5 % — ABNORMAL LOW (ref 36.0–46.0)
Hemoglobin: 7.9 g/dL — ABNORMAL LOW (ref 12.0–15.0)
LYMPHS ABS: 2.4 10*3/uL (ref 0.7–4.0)
Lymphocytes Relative: 9 %
MCH: 25 pg — AB (ref 26.0–34.0)
MCHC: 32.2 g/dL (ref 30.0–36.0)
MCV: 77.5 fL — AB (ref 78.0–100.0)
MONO ABS: 1.9 10*3/uL — AB (ref 0.1–1.0)
Monocytes Relative: 7 %
NEUTROS ABS: 22.6 10*3/uL — AB (ref 1.7–7.7)
Neutrophils Relative %: 84 %
Platelets: 416 10*3/uL — ABNORMAL HIGH (ref 150–400)
RBC: 3.16 MIL/uL — ABNORMAL LOW (ref 3.87–5.11)
RDW: 14.4 % (ref 11.5–15.5)
WBC: 26.9 10*3/uL — ABNORMAL HIGH (ref 4.0–10.5)

## 2016-08-10 LAB — BASIC METABOLIC PANEL
ANION GAP: 11 (ref 5–15)
BUN: 13 mg/dL (ref 6–20)
CALCIUM: 7.7 mg/dL — AB (ref 8.9–10.3)
CO2: 22 mmol/L (ref 22–32)
Chloride: 102 mmol/L (ref 101–111)
Creatinine, Ser: 0.97 mg/dL (ref 0.44–1.00)
GFR calc Af Amer: >60 mL/min (ref >60)
GFR calc non Af Amer: >60 mL/min (ref >60)
GLUCOSE: 348 mg/dL — AB (ref 65–99)
Potassium: 3.8 mmol/L (ref 3.5–5.1)
Sodium: 135 mmol/L (ref 135–145)

## 2016-08-10 LAB — GLUCOSE, CAPILLARY
GLUCOSE-CAPILLARY: 192 mg/dL — AB (ref 65–99)
Glucose-Capillary: 131 mg/dL — ABNORMAL HIGH (ref 65–99)
Glucose-Capillary: 135 mg/dL — ABNORMAL HIGH (ref 65–99)
Glucose-Capillary: 306 mg/dL — ABNORMAL HIGH (ref 65–99)
Glucose-Capillary: 325 mg/dL — ABNORMAL HIGH (ref 65–99)

## 2016-08-10 LAB — MAGNESIUM: Magnesium: 1.9 mg/dL (ref 1.7–2.4)

## 2016-08-10 LAB — PREALBUMIN: Prealbumin: <5 mg/dL — ABNORMAL LOW (ref 18–38)

## 2016-08-10 LAB — PHOSPHORUS: PHOSPHORUS: 2.6 mg/dL (ref 2.5–4.6)

## 2016-08-10 MED ORDER — ADULT MULTIVITAMIN W/MINERALS CH
1.0000 | ORAL_TABLET | Freq: Every day | ORAL | Status: DC
  Administered 2016-08-10 – 2016-08-19 (×8): 1 via ORAL
  Filled 2016-08-10 (×10): qty 1

## 2016-08-10 MED ORDER — ONDANSETRON HCL 4 MG/2ML IJ SOLN
4.0000 mg | Freq: Four times a day (QID) | INTRAMUSCULAR | Status: DC | PRN
  Administered 2016-08-10: 4 mg via INTRAVENOUS
  Filled 2016-08-10: qty 2

## 2016-08-10 MED ORDER — ATORVASTATIN CALCIUM 40 MG PO TABS
40.0000 mg | ORAL_TABLET | Freq: Every day | ORAL | Status: DC
  Administered 2016-08-10 – 2016-08-18 (×7): 40 mg via ORAL
  Filled 2016-08-10 (×8): qty 1

## 2016-08-10 MED ORDER — SENNOSIDES-DOCUSATE SODIUM 8.6-50 MG PO TABS
1.0000 | ORAL_TABLET | Freq: Every day | ORAL | Status: DC
  Administered 2016-08-11 – 2016-08-18 (×8): 1 via ORAL
  Filled 2016-08-10 (×9): qty 1

## 2016-08-10 MED ORDER — INSULIN ASPART 100 UNIT/ML ~~LOC~~ SOLN
0.0000 [IU] | Freq: Every day | SUBCUTANEOUS | Status: DC
  Administered 2016-08-11: 5 [IU] via SUBCUTANEOUS

## 2016-08-10 MED ORDER — BOOST / RESOURCE BREEZE PO LIQD
1.0000 | Freq: Three times a day (TID) | ORAL | Status: DC
  Administered 2016-08-10 – 2016-08-14 (×13): 1 via ORAL

## 2016-08-10 MED ORDER — INSULIN GLARGINE 100 UNIT/ML ~~LOC~~ SOLN
12.0000 [IU] | Freq: Every day | SUBCUTANEOUS | Status: DC
  Administered 2016-08-10 – 2016-08-11 (×2): 12 [IU] via SUBCUTANEOUS
  Filled 2016-08-10 (×2): qty 0.12

## 2016-08-10 MED ORDER — FUROSEMIDE 10 MG/ML IJ SOLN
40.0000 mg | Freq: Once | INTRAMUSCULAR | Status: AC
  Administered 2016-08-10: 40 mg via INTRAVENOUS
  Filled 2016-08-10: qty 4

## 2016-08-10 MED ORDER — ONDANSETRON HCL 4 MG/2ML IJ SOLN: 4.0000 mg | Freq: Four times a day (QID) | INTRAMUSCULAR | Status: DC | PRN

## 2016-08-10 MED ORDER — INSULIN ASPART 100 UNIT/ML ~~LOC~~ SOLN
4.0000 [IU] | Freq: Three times a day (TID) | SUBCUTANEOUS | Status: DC
  Administered 2016-08-10 – 2016-08-12 (×5): 4 [IU] via SUBCUTANEOUS

## 2016-08-10 MED ORDER — INSULIN ASPART 100 UNIT/ML ~~LOC~~ SOLN
0.0000 [IU] | Freq: Three times a day (TID) | SUBCUTANEOUS | Status: DC
Start: 2016-08-10 — End: 2016-08-12
  Administered 2016-08-10: 3 [IU] via SUBCUTANEOUS
  Administered 2016-08-10: 11 [IU] via SUBCUTANEOUS
  Administered 2016-08-11: 2 [IU] via SUBCUTANEOUS
  Administered 2016-08-12: 5 [IU] via SUBCUTANEOUS
  Administered 2016-08-12 (×2): 15 [IU] via SUBCUTANEOUS

## 2016-08-10 MED ORDER — PROMETHAZINE HCL 25 MG/ML IJ SOLN
25.0000 mg | Freq: Once | INTRAMUSCULAR | Status: AC | PRN
Start: 2016-08-10 — End: 2016-08-10
  Administered 2016-08-10: 25 mg via INTRAVENOUS
  Filled 2016-08-10: qty 1

## 2016-08-10 MED ORDER — ONDANSETRON 8 MG/NS 50 ML IVPB
8.0000 mg | Freq: Four times a day (QID) | INTRAVENOUS | Status: DC | PRN
  Filled 2016-08-10: qty 8

## 2016-08-10 MED ORDER — ONDANSETRON HCL 4 MG/2ML IJ SOLN
4.0000 mg | Freq: Four times a day (QID) | INTRAMUSCULAR | Status: DC | PRN
  Administered 2016-08-11: 8 mg via INTRAVENOUS
  Administered 2016-08-11 – 2016-08-16 (×3): 4 mg via INTRAVENOUS
  Filled 2016-08-10 (×3): qty 2
  Filled 2016-08-10: qty 4

## 2016-08-10 NOTE — Progress Notes (Signed)
   I discussed earlier with her pcp the need for proximal amputation given necrosis of most of foot. At this time Dr. Jimmye Norman will discuss comfort measures with her and family. if amputation is desired please contact me and I will get her scheduled at earliest convenience.   Makel Mcmann C. Donzetta Matters, MD [facility] [place] Office: [phone] Pager: [phone]

## 2016-08-10 NOTE — Progress Notes (Signed)
Stopped by to visit w/ pt, who initially opened her eyes and invited me in. But she soon seemed very sleepy and asked me to call the nurse. I did. Chaplain available for f/u.   08/10/16 1400  Clinical Encounter Type  Visited With Patient;Patient not available;Health care provider  Visit Type Initial;Psychological support;Spiritual support;Social support  Referral From [place] Matelyn Antonelli, Chaplain

## 2016-08-10 NOTE — Plan of Care (Signed)
Problem: Safety: Goal: Ability to remain free from injury will improve Bed alarm is on and working.

## 2016-08-10 NOTE — Progress Notes (Signed)
Internal Medicine Attending:   I saw and examined the patient. I reviewed the resident's note and I agree with the resident's findings and plan as documented in the resident's note. As noted in Dr Bernette Redbird note we spent an extended time with the telephonic translator explaining the need for further amputation of her leg as without this she is at risk of infection spreading and untimately causing death.  She reports that she will die regardless of surgery or not and seems very resistant to the idea of further amputation.  She did report she wanted [place] to speak with her son.  She denied any specific complaints and reports pain is well controlled. After the procedure she was noted to be SOB and she has a new O2 recquiment. On exam she is in no distress, she has RLL crackles, cardiac exam is RRR no murmur, right leg s/p bka, left lower leg is bandaged c/d/i.  A/P Osteomyelitis of left leg - Currently on broad spectrium abx with Vanc/ceftiaxone/flagyl, follow cultures - Noted Dr Claretha Cooper conversation as well as ours about amputation,  I do no think there will be much additional benefit from an inpatient palliative care consult, however Dr Hetty Ely will contact her PCP at [facility] Dr Jimmye Norman to discuss the situation, family discussions and possible palliative options that [facility] may be able to provide.  T2DM - Hyperglycemic - Change to moderate SSI, add mealtime and increase lantus.  Acute CHF -I suspect her SOB is due to pulmonary edema from her IVF associated with her surgery/IV abx (she appear to have gotten ~3L. - D/C IVF, start IV lasix -Monitor I&O, daily weights

## 2016-08-10 NOTE — Progress Notes (Signed)
Initial Nutrition Assessment  DOCUMENTATION CODES:   Not applicable  INTERVENTION:    Boost Breeze po TID, each supplement provides 250 kcal and 9 grams of protein   MVI with minerals po daily   NUTRITION DIAGNOSIS:   Increased nutrient needs related to wound healing, chronic illness as evidenced by estimated needs  GOAL:   Patient will meet greater than or equal to 90% of their needs  MONITOR:   PO intake, Supplement acceptance, Labs, Weight trends, Skin, I & O's  REASON FOR ASSESSMENT:   Consult Assessment of nutrition requirement/status  ASSESSMENT:   "56 year old [name] female with extensive past medical history of hypertension, uncontrolled diabetes, hyperlipidemia, peripheral arterial disease for which she is actually had a right below-the-knee as well as left femoral artery stenting, who presents from her PCP due to worsening left first toe gangrene." Copied from Internal Medicine note, Velna Ochs, MD, 08/08/16.   Pt s/p open L great toe amputation 6/19. Currently on a Clear Liquid diet.  PO intake 50-100% per flowsheet records. Medications reviewed and include potassium chloride and ABX. Labs reviewed. Mg 1.4 (L). PAB <5 (L). CBG's [phone].  Limited Nutrition Focused Physical Exam completed to upper body. No muscle or subcutaneous fat depletion noticed.  Diet Order:  Diet clear liquid Room service appropriate? Yes; Fluid consistency: Thin  Skin:  Wound (see comment) (s/p right BKA and left SFA stenting )  Last BM:  PTA  Height:   Ht Readings from Last 1 Encounters:  04/20/16 5' 2.4" (1.585 m)   Weight:   Wt Readings from Last 1 Encounters:  08/08/16 140 lb 3.4 oz (63.6 kg)   BMI:  27 kg/m2 >>> adjusted for BKA  Estimated Nutritional Needs:   Kcal:  1700-1900  Protein:  90-100 gm  Fluid:  1.7-1.9 L  EDUCATION NEEDS:   No education needs identified at this time  Arthur Holms, RD, LDN Pager #: [place] Pager  #: [phone]

## 2016-08-10 NOTE — Progress Notes (Addendum)
[facility] [place]  Subjective  - Doing OK.   Objective 139/62 72 98.5 F (36.9 C) (Oral) 18 94%  Intake/Output Summary (Last 24 hours) at 08/10/16 0738 Last data filed at 08/10/16 0524  Gross per 24 hour  Intake          1241.66 ml  Output                2 ml  Net          1239.66 ml    Left foot dressing in place clean and dry Left leg warm to touch  Assessment/Planning: POD # 1 Open left great toe amputation  Findings: There is significant pus deep to the left great toe. The necrosis extends proximal to the foot and patient will need below-knee amputation.  Plan per Dr. Marquis Buggy, [facility] 08/10/2016 7:38 AM --  Laboratory Lab Results:  Recent Labs  08/08/16 1650 08/09/16 0339  WBC 24.9* 23.5*  HGB 9.7* 9.4*  HCT 29.7* 28.7*  PLT 465* 440*   BMET  Recent Labs  08/09/16 0339 08/10/16 0533  NA 137 135  K 3.3* 3.8  CL 102 102  CO2 22 22  GLUCOSE 219* 348*  BUN 12 13  CREATININE 0.86 0.97  CALCIUM 7.8* 7.7*    COAG Lab Results  Component Value Date   INR 1.26 08/09/2016   No results found for: PTT  I have independently interviewed patient and agree with PA assessment and plan above. I spent approximately 30 minutes with patient and husband discussing amputation first via interpreter followed by son-in-law. She stated several times that she would rather die than have an amputation. Husband attempting to convince her to proceed but he wants a below knee amputation. I attempted on many occasions to tell them she needs above knee amputation and will likely not walk again. In the end she is refusing and would benefit from palliative consultation. Wet to dry to left great toe amputation site. Will re-visit tomorrow.   Brandon C. Donzetta Matters, MD [facility] [place] Office: [phone] Pager: [phone]

## 2016-08-10 NOTE — Progress Notes (Signed)
   Subjective: Kimberly Lane reports that her pain is well controlled today. She denies chest pain, difficulty breathing, cough, or abdominal pain. She had a bowel movement yesterday. She has a lot of concern about proceeding with the BKA procedure, she ask multiple times whether she will be able to walk after the procedure and expresses that she does not live with any children who can take care of her. I explained our concern of worsening of her infection and complications including sepsis and needing a more extensive amputation in the future if the limb is not removed, she says that she feels like loosing her leg would be equivalent to dying.   Objective:  Vital signs in last 24 hours: Vitals:   08/09/16 1050 08/09/16 1300 08/09/16 2140 08/10/16 0524  BP: 136/65 134/64 (!) 143/54 139/62  Pulse: 70 74 81 72  Resp: 20 19 19 18   Temp: 98.6 F (37 C) 98.6 F (37 C) 98.4 F (36.9 C) 98.5 F (36.9 C)  TempSrc: Oral Oral Oral Oral  SpO2: 93% 94% 91% 94%  Weight:       General Apperance: NAD HEENT: Normocephalic, atraumatic, anicteric sclera Neck: Supple, trachea midline Lungs: Clear to auscultation bilaterally. No wheezes, rhonchi or rales, no peripheral edema Heart: Regular rate and rhythm, no murmur/rub/gallop Abdomen: Soft, nontender, nondistended, no rebound/guarding Extremities: Warm and well perfused, R BKA, LLE in dressing Skin: No rashes Neurologic: Alert and interactive. No gross deficits.   Assessment/Plan: 56 year old [name] speaking woman with HTN, HLD, PAD, uncontrolled DM presenting with left toe osteomyelitis and systemic symptoms of fever, weakness, poor appetite, and dizziness admitted for IV antibiotics and surgical management.   Left Toe Osteomyelitis: Yesterday she underwent left toe amputation, during the procedure extensive pus was found proximal to the toe and vascular surgery has recommended further amputation. -- f/u Blood Cx >> NGTD  -- Cont vanc, ceftriaxone  and flagyl 6/19 >>  -- Norco 5-325 1-2 tabs q4 hours scheduled   -- IV Dilaudid 1 mg q4 hours prn  -- Senakot-s, scheduled   Hypokalemia Hypomagnesemia  Potassium 3.1 on admission, resolved with potassium repletion. Mag 1.4 on admission, resolved with repletion.  - F/U BMP tomorrow morning   HTN:  Normotensive today -- Hold home antihypertensives for now, can restart home medication amlodipine or hydrochlorothiazide if needed  DM: Uncontrolled Last A1c 11.0, home meds include metformin 1000 mg qd and glipizide 7.5 mg qAM.  - SSI - sensitive TID with meals - Lantus 12 units  -follow up A1c  Hyperlipidemia  - continue atorvastatin 40 mg qd   PVD: S/p right BKA and left SFA stenting  -- Restart ASA and plavix when able   Depression: -- Continue home Celexa 20 mg   Low albumin Visibly has muscle wasting on exam, low albumin and prealbumin. Consult to nutrition  FEN: clears, LR@50  VTE ppx: Lovenox  Code Status: FULL   Dispo: Anticipated discharge in approximately 2-3 day(s).   Ledell Noss, MD  08/10/2016, 1:19 PM Pager: [phone]

## 2016-08-11 LAB — GLUCOSE, CAPILLARY
GLUCOSE-CAPILLARY: 148 mg/dL — AB (ref 65–99)
Glucose-Capillary: 347 mg/dL — ABNORMAL HIGH (ref 65–99)

## 2016-08-11 LAB — HEMOGLOBIN A1C
Hgb A1c MFr Bld: 11.4 % — ABNORMAL HIGH (ref 4.8–5.6)
Mean Plasma Glucose: 280 mg/dL

## 2016-08-11 LAB — BASIC METABOLIC PANEL
ANION GAP: 9 (ref 5–15)
BUN: 13 mg/dL (ref 6–20)
CALCIUM: 8.1 mg/dL — AB (ref 8.9–10.3)
CO2: 25 mmol/L (ref 22–32)
Chloride: 102 mmol/L (ref 101–111)
Creatinine, Ser: 0.93 mg/dL (ref 0.44–1.00)
GFR calc Af Amer: >60 mL/min (ref >60)
Glucose, Bld: 125 mg/dL — ABNORMAL HIGH (ref 65–99)
POTASSIUM: 3 mmol/L — AB (ref 3.5–5.1)
SODIUM: 136 mmol/L (ref 135–145)

## 2016-08-11 MED ORDER — AMLODIPINE BESYLATE 10 MG PO TABS
10.0000 mg | ORAL_TABLET | Freq: Every day | ORAL | Status: DC
  Administered 2016-08-11 – 2016-08-19 (×9): 10 mg via ORAL
  Filled 2016-08-11 (×9): qty 1

## 2016-08-11 MED ORDER — POTASSIUM CHLORIDE CRYS ER 20 MEQ PO TBCR
40.0000 meq | EXTENDED_RELEASE_TABLET | Freq: Once | ORAL | Status: DC
  Filled 2016-08-11: qty 2

## 2016-08-11 NOTE — Progress Notes (Signed)
  Progress Note    08/11/2016 2:57 PM 2 Days Post-Op  Subjective:  No acute issuse  Vitals:   08/10/16 2200 08/11/16 0433  BP: 140/70 (!) 153/64  Pulse: 60 83  Resp: 19 18  Temp: 98 F (36.7 C) 99.1 F (37.3 C)    Physical Exam: Awake and alert Left foot dressing cdi  CBC    Component Value Date/Time   WBC 26.9 (H) 08/10/2016 0533   RBC 3.16 (L) 08/10/2016 0533   HGB 7.9 (L) 08/10/2016 0533   HCT 24.5 (L) 08/10/2016 0533   PLT 416 (H) 08/10/2016 0533   MCV 77.5 (L) 08/10/2016 0533   MCH 25.0 (L) 08/10/2016 0533   MCHC 32.2 08/10/2016 0533   RDW 14.4 08/10/2016 0533   LYMPHSABS 2.4 08/10/2016 0533   MONOABS 1.9 (H) 08/10/2016 0533   EOSABS 0.0 08/10/2016 0533   BASOSABS 0.0 08/10/2016 0533    BMET    Component Value Date/Time   NA 136 08/11/2016 0631   K 3.0 (L) 08/11/2016 0631   CL 102 08/11/2016 0631   CO2 25 08/11/2016 0631   GLUCOSE 125 (H) 08/11/2016 0631   BUN 13 08/11/2016 0631   CREATININE 0.93 08/11/2016 0631   CREATININE 0.58 12/18/2013 1515   CALCIUM 8.1 (L) 08/11/2016 0631   GFRNONAA >60 08/11/2016 0631   GFRAA >60 08/11/2016 0631    INR    Component Value Date/Time   INR 1.26 08/09/2016 0339     Intake/Output Summary (Last 24 hours) at 08/11/16 1457 Last data filed at 08/11/16 0200  Gross per 24 hour  Intake              325 ml  Output                0 ml  Net              325 ml     Assessment:  56 y.o. female is here with necrosis of left foot, s/p open amputation of left great toe.   Plan: Left above knee amputation plan for Saturday. Discussed with Dr. Jimmye Norman ([facility]) and patient has signed consent via her interpretor.    Darwin Rothlisberger C. Donzetta Matters, MD [facility] [place] Office: [phone] Pager: [phone]  08/11/2016 2:57 PM

## 2016-08-11 NOTE — Progress Notes (Signed)
Patient refused ordered potassium medication. RN encouraged patient to take it, but she still refused.

## 2016-08-11 NOTE — Progress Notes (Signed)
Internal Medicine Attending:   I saw and examined the patient. I reviewed the resident's note and I agree with the resident's findings and plan as documented in the resident's note. We appreciate Dr Jimmye Norman (her PCP) and interpreter who speaks her dialect who were able to come today, discussed her infection and need for left AKA.  She is now agreeable for left AKA which is now scheduled for Saturday.  Otherwise her blood glucose is much better controlled, she did have some noted nausea overnight. Her SOB did improve with lasix she still has some mild bibasilar crackles, UOP was not documented, she now has some hypokalemia, we have ordered oral replacement.  I suspect her SOB was due to Acute pulmonary edema, it is possible that she has some degree of CHF but she does not have a known history.  I would like to wean down her supplemental oxygen as tolerated.

## 2016-08-11 NOTE — Plan of Care (Signed)
Problem: Safety: Goal: Ability to remain free from injury will improve Outcome: Progressing No falls during this admission. Call bell within reach. Bed in low and locked position. Patient alert and oriented. Clean and clear environment maintained. 3/4 siderails in place. Bed alarm on. Patient and family verbalized understanding of safety instruction.  Problem: Pain Management: Goal: General experience of comfort will improve Outcome: Progressing Patient resting well in bed. Vital signs are stable.

## 2016-08-11 NOTE — Progress Notes (Addendum)
Subjective: [name] reports that her pain is not well controlled today and she is having nausea. It appears that she has had difficulty requesting PRN medications. She denies chest pain, difficulty breathing, or abdominal pain. She has had a decreased appetite. Her primary care physician Dr. Jimmye Norman is at bedside she spoke with Kimberly Lane and a translator at bedside about the outcome if she does not proceed with AKA, Kimberly Lane had a better understanding after this and has decided to proceed with surgery. Vascular surgery was contacted, Dr. Gwyndolyn Saxon will complete the consent for surgery at this time.   Objective:  Vital signs in last 24 hours: Vitals:   08/10/16 0524 08/10/16 1500 08/10/16 2200 08/11/16 0433  BP: 139/62 140/65 140/70 (!) 153/64  Pulse: 72 66 60 83  Resp: 18 18 19 18   Temp: 98.5 F (36.9 C) 98 F (36.7 C) 98 F (36.7 C) 99.1 F (37.3 C)  TempSrc: Oral Oral Oral Oral  SpO2: 94% (!) 86% 96% 100%  Weight:       General Apperance: NAD HEENT: Normocephalic, atraumatic, anicteric sclera Neck: Supple, trachea midline Lungs: Clear to auscultation bilaterally. No wheezes. no peripheral edema. Bibasilar crackles  Heart: Regular rate and rhythm, no murmur/rub/gallop Abdomen: Soft, nontender, nondistended, no rebound/guarding Extremities: Warm and well perfused, R BKA, LLE in dressing Skin: No rashes Neurologic: Alert and interactive. No gross deficits.   Assessment/Plan: 56 year old Kimberly Lane speaking woman with HTN, HLD, PAD, uncontrolled DM presenting with left toe osteomyelitis and systemic symptoms of fever, weakness, poor appetite, and dizziness admitted for IV antibiotics and surgical management.   Left Toe Osteomyelitis: Pain has not been well controlled, it seems that she has been refusing scheduled pain medications and has difficulty communicating that she is in pain given the language barrier. She was oriented on how to use the call bell and communicate when she is  in pain.  -- f/u Blood Cx >> NGTD  - surgical culture >> citrobacter freudii which is sensitive to ceftriaxone  -- Cont vanc, ceftriaxone and flagyl 6/19 >>  -- Norco 5-325 1-2 tabs q4 hours scheduled   -- IV Dilaudid 1 mg q4 hours prn  -- Senakot-s, scheduled  - Zofran PRN   Hypokalemia Hypomagnesemia  Potassium 3.1 on admission, resolved with potassium repletion but then trended down again and she refuses potassium supplementation. Mag 1.4 on admission, resolved with repletion.  Encouraged to take potassium 40 meq x 2  - F/U BMP - F/U Mag   HTN:  Blood pressures trending up today -Restarted on medication amlodipine 10 mg daily -will hold off on restarting HCTZ for now but this can be restarted if her blood pressure continues to rise.   DM: Uncontrolled Last A1c 11.0, home meds include metformin 1000 mg qd and glipizide 7.5 mg qAM.  - SSI - sensitive TID with meals - Lantus 12 units  -follow up A1c  Hyperlipidemia  - continue atorvastatin 40 mg qd   PVD: S/p right BKA and left SFA stenting  --Will hold off on restarting ASA and plavix so as to not interfere with surgery Saturday but I hope to restart these 24 hours after surgery  Depression: -- Continue home Celexa 20 mg   Low albumin Visibly has muscle wasting on exam, low albumin and prealbumin. Multivitamins and boost ordered   FEN: clears, LR@50  VTE ppx: Lovenox  Code Status: FULL   Dispo: Anticipated discharge in approximately 2-3 day(s).   Ledell Noss, MD  08/11/2016, 2:31 PM  Pager: [phone]

## 2016-08-11 NOTE — Care Management Note (Addendum)
Case Management Note  Patient Details  Name: Kimberly Lane MRN: [id number] Date of Birth: 05/26/1960  Subjective/Objective:   Pt scheduled for Left above knee amputation plan for Saturday                 Action/Plan:   PTA from home with husband but he works.  CM confirmed that pt is active with [facility].   Pt has been at [facility] in the past.   CM will continue to follow for discharge needs   Expected Discharge Date:                  Expected Discharge Plan:  [place]  In-House Referral:     Discharge planning Services  CM Consult  Post Acute Care Choice:    Choice offered to:     DME Arranged:    DME Agency:     HH Arranged:    HH Agency:     Status of Service:     If discussed at [employer], dates discussed:    Additional Comments:  Maryclare Labrador, RN 08/11/2016, 3:46 PM

## 2016-08-11 NOTE — Progress Notes (Addendum)
Pharmacy Antibiotic Note  Kimberly Lane is a 56 y.o. female admitted on 08/08/2016 with  increased L toe pain, elevated WBC, and fever.  Pt has chronic L toe gangrene/osteo being medically managed.  Pt now amenable to amputation.  Pharmacy has been consulted for Vancomycin dosing.   Plan: Continue Vancomycin 750 mg IV every 12 hours.   Vanc trough with AM labs Monitor Scr and CrCL and Cx data and sensitivities  Goal trough 15-20 mcg/mL.  Weight: 140 lb 3.4 oz (63.6 kg)  Temp (24hrs), Avg:98.4 F (36.9 C), Min:98 F (36.7 C), Max:99.1 F (37.3 C)   Recent Labs Lab 08/08/16 1650 08/08/16 1705 08/09/16 0339 08/10/16 0533 08/11/16 0631  WBC 24.9*  --  23.5* 26.9*  --   CREATININE 0.91  --  0.86 0.97 0.93  LATICACIDVEN  --  1.52  --   --   --     Estimated Creatinine Clearance: 59 mL/min (by C-G formula based on SCr of 0.93 mg/dL).    Allergies  Allergen Reactions  . Bactrim [Sulfamethoxazole-Trimethoprim] Nausea And Vomiting and Other (See Comments)    Pt and family verified that pt takes this medication  . Tuberculin Ppd Other (See Comments)    Unknown  . Ace Inhibitors Cough    cough    Antimicrobials this admission: CTX 6/19 >> Vanc 6/18 >> Zosyn 6/18 x 1   Microbiology results: 6/18 BCx >> NGTD 6/19 Left Toe Wound>> CITROBACTER FREUNDII  Thank you for allowing pharmacy to be a part of this patient's care.  Felicity Pellegrini, PharmD 08/11/2016 1:58 PM

## 2016-08-12 ENCOUNTER — Encounter (HOSPITAL_COMMUNITY): Payer: Self-pay | Admitting: Internal Medicine

## 2016-08-12 LAB — GLUCOSE, CAPILLARY
GLUCOSE-CAPILLARY: 283 mg/dL — AB (ref 65–99)
GLUCOSE-CAPILLARY: 392 mg/dL — AB (ref 65–99)
GLUCOSE-CAPILLARY: 409 mg/dL — AB (ref 65–99)
GLUCOSE-CAPILLARY: 416 mg/dL — AB (ref 65–99)
Glucose-Capillary: 150 mg/dL — ABNORMAL HIGH (ref 65–99)
Glucose-Capillary: 237 mg/dL — ABNORMAL HIGH (ref 65–99)
Glucose-Capillary: 253 mg/dL — ABNORMAL HIGH (ref 65–99)

## 2016-08-12 LAB — BASIC METABOLIC PANEL
ANION GAP: 7 (ref 5–15)
BUN: 15 mg/dL (ref 6–20)
CHLORIDE: 106 mmol/L (ref 101–111)
CO2: 27 mmol/L (ref 22–32)
CREATININE: 0.79 mg/dL (ref 0.44–1.00)
Calcium: 8.1 mg/dL — ABNORMAL LOW (ref 8.9–10.3)
GFR calc non Af Amer: >60 mL/min (ref >60)
Glucose, Bld: 243 mg/dL — ABNORMAL HIGH (ref 65–99)
POTASSIUM: 3.6 mmol/L (ref 3.5–5.1)
SODIUM: 140 mmol/L (ref 135–145)

## 2016-08-12 LAB — MAGNESIUM: MAGNESIUM: 1.7 mg/dL (ref 1.7–2.4)

## 2016-08-12 LAB — VANCOMYCIN, TROUGH: VANCOMYCIN TR: 51 ug/mL — AB (ref 15–20)

## 2016-08-12 MED ORDER — FUROSEMIDE 10 MG/ML IJ SOLN
40.0000 mg | Freq: Once | INTRAMUSCULAR | Status: AC
  Administered 2016-08-12: 40 mg via INTRAVENOUS
  Filled 2016-08-12: qty 4

## 2016-08-12 MED ORDER — INSULIN GLARGINE 100 UNIT/ML ~~LOC~~ SOLN: 12.0000 [IU] | Freq: Every day | SUBCUTANEOUS | Status: DC

## 2016-08-12 MED ORDER — INSULIN GLARGINE 100 UNIT/ML ~~LOC~~ SOLN
16.0000 [IU] | Freq: Every day | SUBCUTANEOUS | Status: DC
  Administered 2016-08-12 – 2016-08-14 (×3): 16 [IU] via SUBCUTANEOUS
  Filled 2016-08-12 (×3): qty 0.16

## 2016-08-12 MED ORDER — INSULIN ASPART 100 UNIT/ML ~~LOC~~ SOLN
6.0000 [IU] | Freq: Three times a day (TID) | SUBCUTANEOUS | Status: DC
  Administered 2016-08-14 – 2016-08-19 (×8): 6 [IU] via SUBCUTANEOUS

## 2016-08-12 MED ORDER — INSULIN GLARGINE 100 UNIT/ML ~~LOC~~ SOLN: 16.0000 [IU] | Freq: Every day | SUBCUTANEOUS | Status: DC

## 2016-08-12 MED ORDER — INSULIN ASPART 100 UNIT/ML ~~LOC~~ SOLN
0.0000 [IU] | Freq: Three times a day (TID) | SUBCUTANEOUS | Status: DC
  Administered 2016-08-13: 7 [IU] via SUBCUTANEOUS
  Administered 2016-08-13: 4 [IU] via SUBCUTANEOUS
  Administered 2016-08-13: 3 [IU] via SUBCUTANEOUS
  Administered 2016-08-14 (×2): 11 [IU] via SUBCUTANEOUS
  Administered 2016-08-14: 7 [IU] via SUBCUTANEOUS
  Administered 2016-08-15: 20 [IU] via SUBCUTANEOUS
  Administered 2016-08-15: 7 [IU] via SUBCUTANEOUS
  Administered 2016-08-16: 15 [IU] via SUBCUTANEOUS
  Administered 2016-08-16: 7 [IU] via SUBCUTANEOUS
  Administered 2016-08-16 (×2): 4 [IU] via SUBCUTANEOUS

## 2016-08-12 MED ORDER — INSULIN ASPART 100 UNIT/ML ~~LOC~~ SOLN
0.0000 [IU] | Freq: Every day | SUBCUTANEOUS | Status: DC
  Administered 2016-08-12: 5 [IU] via SUBCUTANEOUS

## 2016-08-12 MED ORDER — INSULIN ASPART 100 UNIT/ML ~~LOC~~ SOLN
10.0000 [IU] | Freq: Once | SUBCUTANEOUS | Status: AC
  Administered 2016-08-12: 10 [IU] via SUBCUTANEOUS

## 2016-08-12 NOTE — Progress Notes (Signed)
   Subjective: Ms. Macari reports that her pain and nausea are well controlled by medications this morning. She ate breakfast this morning and request a more substantial diet.  Objective:  Vital signs in last 24 hours: Vitals:   08/11/16 1650 08/11/16 2211 08/12/16 0537 08/12/16 0900  BP: (!) 146/69 139/66 131/61   Pulse: 86 72 64   Resp: 17 18 16    Temp: 99.9 F (37.7 C) 98.2 F (36.8 C) 97.7 F (36.5 C)   TempSrc: Oral Oral Oral   SpO2: 96% 97% 93%   Weight:    144 lb 6.4 oz (65.5 kg)   General Apperance: NAD HEENT: Normocephalic, atraumatic, anicteric sclera Neck: Supple, trachea midline Lungs: On nasal canula Clear to auscultation bilaterally. No wheezes or rales. no peripheral edema. Heart: Regular rate and rhythm, no murmur/rub/gallop Abdomen: Soft, nontender, nondistended, no rebound/guarding Extremities: Warm and well perfused, R BKA, LLE in dressing Skin: No rashes Neurologic: Alert and interactive. No gross deficits.   Assessment/Plan: 56 year old [name] speaking woman with HTN, HLD, PAD, uncontrolled DM presenting with left toe osteomyelitis and systemic symptoms of fever, weakness, poor appetite, and dizziness admitted for IV antibiotics and surgical management.   Left Toe Osteomyelitis: Pain is well controlled today. She is scheduled for AKA tomorrow. - Nothing by mouth at midnight -- f/u Blood Cx >> NGTD  - surgical culture >> polymicrobial, citrobacter freudii which is sensitive to ceftriaxone has grown but this was after vanc and zosyn  -- Cont vanc, ceftriaxone and flagyl 6/19 >>  -- Norco 5-325 1-2 tabs q4 hours scheduled   -- IV Dilaudid 1 mg q4 hours prn  -- Senakot-s, scheduled  - Zofran PRN   Volume overload  Truman Hayward she may have developed some volume overload with IV fluids and if her dose of IV Lasix yesterday. Her urine output was not recorded overnight but her lungs sound clearer on exam today but it seems the lasix had helped her breathing improve  yesterday. This morning she is requiring nasal cannula so that an additional dose of ibuprofen.  Hypokalemia Hypomagnesemia  Resolved with repletion. Continue daily BMP  HTN:  Normotensive today.  -Continue home medication amlodipine 10 mg daily -will hold off on restarting HCTZ for now but this can be restarted if her blood pressure continues to rise  DM, Uncontrolled Last A1c 11.4, home meds include metformin 1000 mg qd and glipizide 7.5 mg qAM. Blood glucose trended in the 2 to 300s yesterday and she did not receive sliding scale doses, today her sliding scale will be enforced and she will transition to a carb modified diet.  - SSI - sensitive TID with meals - increase Lantus to 16 units daily    Hyperlipidemia  - continue atorvastatin 40 mg qd   PVD: S/p right BKA and left SFA stenting  --Will hold off on restarting ASA and plavix so as to not interfere with surgery Saturday but I hope to restart these 24 hours after surgery  Depression: -- Continue home Celexa 20 mg   Low albumin Nutrition was consulted and ordered Multivitamins and boost ordered   FEN: clears, LR@50  VTE ppx: Lovenox  Code Status: FULL   Dispo: Anticipated discharge in approximately 2-3 day(s).   Ledell Noss, MD  08/12/2016, 12:46 PM Pager: [phone]

## 2016-08-12 NOTE — Progress Notes (Signed)
Internal Medicine Attending:   I saw and examined the patient. I reviewed the resident's note and I agree with the resident's findings and plan as documented in the resident's note. Reports pain well controlled. She is back on supplemental O2.  On exam she is in no distress, lungs breath sounds are mildly decreased at the bases without overt rales. I do not appreciate much pedal edema, left leg is bandaged, right leg BKA. Overall we will give an additional dose of IV lasxi for suspected mild pulmonary edema due to her IVF. For DM> appears she was much more hyperglycemic, I do not see that much SSI was used yesterday.  We will need to make sure that SSI is used regardless if patient is eating her meals (it does appears she is), will also use mealtime 4units of Novolog if she is expected to eat her next meal.   Will increase lantus to 16 as Dr Hetty Ely noted. Vanc level high >>> not a trough. Appreciate pharmacy's help.  Plan for left AKA tomorrow.

## 2016-08-12 NOTE — Progress Notes (Signed)
CRITICAL VALUE ALERT  Critical Value:  51 Date & Time Notied: 08/12/2016 Provider Notified: HOFFMAN Orders Received/Actions taken:

## 2016-08-12 NOTE — Progress Notes (Signed)
  Progress Note    08/12/2016 8:34 AM 3 Days Post-Op  Subjective:  No new complaints  Vitals:   08/11/16 2211 08/12/16 0537  BP: 139/66 131/61  Pulse: 72 64  Resp: 18 16  Temp: 98.2 F (36.8 C) 97.7 F (36.5 C)    Physical Exam: Awake and alert Dressing on left foot clean and dry  CBC    Component Value Date/Time   WBC 26.9 (H) 08/10/2016 0533   RBC 3.16 (L) 08/10/2016 0533   HGB 7.9 (L) 08/10/2016 0533   HCT 24.5 (L) 08/10/2016 0533   PLT 416 (H) 08/10/2016 0533   MCV 77.5 (L) 08/10/2016 0533   MCH 25.0 (L) 08/10/2016 0533   MCHC 32.2 08/10/2016 0533   RDW 14.4 08/10/2016 0533   LYMPHSABS 2.4 08/10/2016 0533   MONOABS 1.9 (H) 08/10/2016 0533   EOSABS 0.0 08/10/2016 0533   BASOSABS 0.0 08/10/2016 0533    BMET    Component Value Date/Time   NA 140 08/12/2016 0556   K 3.6 08/12/2016 0556   CL 106 08/12/2016 0556   CO2 27 08/12/2016 0556   GLUCOSE 243 (H) 08/12/2016 0556   BUN 15 08/12/2016 0556   CREATININE 0.79 08/12/2016 0556   CREATININE 0.58 12/18/2013 1515   CALCIUM 8.1 (L) 08/12/2016 0556   GFRNONAA >60 08/12/2016 0556   GFRAA >60 08/12/2016 0556    INR    Component Value Date/Time   INR 1.26 08/09/2016 0339     Intake/Output Summary (Last 24 hours) at 08/12/16 0834 Last data filed at 08/11/16 1820  Gross per 24 hour  Intake              240 ml  Output                0 ml  Net              240 ml    Assessment:  56 y.o. female is here with necrosis of left foot, s/p open amputation of left great toe.   Plan: Left above knee amputation plan for tomorrow, npo at midnight today.     Keerthana Vanrossum C. Donzetta Matters, MD [facility] [place] Office: [phone] Pager: [phone]  08/12/2016 8:34 AM

## 2016-08-12 NOTE — Progress Notes (Signed)
Pharmacy Antibiotic Note  Kimberly Lane is a 56 y.o. female admitted on 08/08/2016 with  increased L toe pain, elevated WBC, and fever.  Pt has chronic L toe gangrene/osteo being medically managed.  Pt now amenable to amputation.  Pharmacy has been consulted for Vancomycin dosing.  Vancomycin trough was ordered and reported at 51 however RN hung dose before trough was drawn. Therefore the result is more like a peak and cannot be used as a trough. Renal function appears stable, no UOP documented.   Plan: Continue vancomycin 750 mg IV every 12 hours, goal trough 15-20 mcg/ml. Monitor renal function F/U LOT of abx post amputation    Weight: 144 lb 6.4 oz (65.5 kg)  Temp (24hrs), Avg:98.6 F (37 C), Min:97.7 F (36.5 C), Max:99.9 F (37.7 C)   Recent Labs Lab 08/08/16 1650 08/08/16 1705 08/09/16 0339 08/10/16 0533 08/11/16 0631 08/12/16 0556 08/12/16 1115  WBC 24.9*  --  23.5* 26.9*  --   --   --   CREATININE 0.91  --  0.86 0.97 0.93 0.79  --   LATICACIDVEN  --  1.52  --   --   --   --   --   VANCOTROUGH  --   --   --   --   --   --  51*    Estimated Creatinine Clearance: 69.6 mL/min (by C-G formula based on SCr of 0.79 mg/dL).    Allergies  Allergen Reactions  . Bactrim [Sulfamethoxazole-Trimethoprim] Nausea And Vomiting and Other (See Comments)    Pt and family verified that pt takes this medication  . Tuberculin Ppd Other (See Comments)    Unknown  . Ace Inhibitors Cough    cough    Antimicrobials this admission: Vanc 6/18 >> Zosyn 6/18 x 1 CTX 6/19 >>  Flagyl 6/19 >>   Microbiology results: 6/18 BCx - NGTD 6/19 left toe wound - Citrobacter freundii (R-cefazolin)  Thank you for allowing pharmacy to be a part of this patient's care.  Renold Genta, PharmD, BCPS Clinical Pharmacist Phone for today - [place] - [phone] 08/12/2016 12:35 PM

## 2016-08-12 NOTE — Progress Notes (Signed)
Patient will eat when prompted and hand fed. Nurse fed patient dinner. Patient at half of meat, all of mixed vegetables, all of apple sauce, drink 120 ml of tea and all of boost 240 ml.

## 2016-08-12 NOTE — Progress Notes (Signed)
Notified Dr,. Hetty Ely of rising blood sugars despite SSI coverage. New orders received to give Lantus dose early along with SSI.

## 2016-08-13 ENCOUNTER — Inpatient Hospital Stay (HOSPITAL_COMMUNITY): Payer: No Typology Code available for payment source

## 2016-08-13 ENCOUNTER — Encounter (HOSPITAL_COMMUNITY)
Admission: EM | Disposition: A | Discharge status: Rehabilitation Facility | Payer: Self-pay | Source: Home / Self Care | Attending: Internal Medicine

## 2016-08-13 ENCOUNTER — Encounter (HOSPITAL_COMMUNITY): Payer: Self-pay | Admitting: Certified Registered Nurse Anesthetist

## 2016-08-13 DIAGNOSIS — M868X7 Other osteomyelitis, ankle and foot: Secondary | ICD-10-CM

## 2016-08-13 DIAGNOSIS — E785 Hyperlipidemia, unspecified: Secondary | ICD-10-CM

## 2016-08-13 DIAGNOSIS — E1151 Type 2 diabetes mellitus with diabetic peripheral angiopathy without gangrene: Secondary | ICD-10-CM

## 2016-08-13 DIAGNOSIS — Z79899 Other long term (current) drug therapy: Secondary | ICD-10-CM

## 2016-08-13 DIAGNOSIS — E8809 Other disorders of plasma-protein metabolism, not elsewhere classified: Secondary | ICD-10-CM

## 2016-08-13 DIAGNOSIS — E8779 Other fluid overload: Secondary | ICD-10-CM

## 2016-08-13 DIAGNOSIS — F329 Major depressive disorder, single episode, unspecified: Secondary | ICD-10-CM

## 2016-08-13 DIAGNOSIS — Z794 Long term (current) use of insulin: Secondary | ICD-10-CM

## 2016-08-13 DIAGNOSIS — Z7902 Long term (current) use of antithrombotics/antiplatelets: Secondary | ICD-10-CM

## 2016-08-13 DIAGNOSIS — Z7982 Long term (current) use of aspirin: Secondary | ICD-10-CM

## 2016-08-13 DIAGNOSIS — E876 Hypokalemia: Secondary | ICD-10-CM

## 2016-08-13 DIAGNOSIS — E1169 Type 2 diabetes mellitus with other specified complication: Principal | ICD-10-CM

## 2016-08-13 LAB — GLUCOSE, CAPILLARY
GLUCOSE-CAPILLARY: 148 mg/dL — AB (ref 65–99)
GLUCOSE-CAPILLARY: 231 mg/dL — AB (ref 65–99)
GLUCOSE-CAPILLARY: 251 mg/dL — AB (ref 65–99)
Glucose-Capillary: 164 mg/dL — ABNORMAL HIGH (ref 65–99)
Glucose-Capillary: 188 mg/dL — ABNORMAL HIGH (ref 65–99)

## 2016-08-13 LAB — CULTURE, BLOOD (ROUTINE X 2)
CULTURE: NO GROWTH
Culture: NO GROWTH
Special Requests: ADEQUATE
Special Requests: ADEQUATE

## 2016-08-13 LAB — BASIC METABOLIC PANEL
Anion gap: 9 (ref 5–15)
BUN: 13 mg/dL (ref 6–20)
CHLORIDE: 103 mmol/L (ref 101–111)
CO2: 25 mmol/L (ref 22–32)
CREATININE: 0.84 mg/dL (ref 0.44–1.00)
Calcium: 8.2 mg/dL — ABNORMAL LOW (ref 8.9–10.3)
GFR calc non Af Amer: >60 mL/min (ref >60)
GLUCOSE: 143 mg/dL — AB (ref 65–99)
Potassium: 3 mmol/L — ABNORMAL LOW (ref 3.5–5.1)
Sodium: 137 mmol/L (ref 135–145)

## 2016-08-13 LAB — PROTIME-INR
INR: 1.18
Prothrombin Time: 15.1 seconds (ref 11.4–15.2)

## 2016-08-13 LAB — CBC
HEMATOCRIT: 25.9 % — AB (ref 36.0–46.0)
HEMOGLOBIN: 8.3 g/dL — AB (ref 12.0–15.0)
MCH: 24.9 pg — ABNORMAL LOW (ref 26.0–34.0)
MCHC: 32 g/dL (ref 30.0–36.0)
MCV: 77.5 fL — ABNORMAL LOW (ref 78.0–100.0)
Platelets: 464 10*3/uL — ABNORMAL HIGH (ref 150–400)
RBC: 3.34 MIL/uL — ABNORMAL LOW (ref 3.87–5.11)
RDW: 14.7 % (ref 11.5–15.5)
WBC: 25 10*3/uL — AB (ref 4.0–10.5)

## 2016-08-13 SURGERY — AMPUTATION, ABOVE KNEE
Anesthesia: General | Laterality: Left

## 2016-08-13 MED ORDER — HYDROCODONE-ACETAMINOPHEN 5-325 MG PO TABS: 1.0000 | ORAL_TABLET | Freq: Four times a day (QID) | ORAL | Status: DC

## 2016-08-13 MED ORDER — FUROSEMIDE 10 MG/ML IJ SOLN
40.0000 mg | Freq: Two times a day (BID) | INTRAMUSCULAR | Status: AC
  Administered 2016-08-13 (×2): 40 mg via INTRAVENOUS
  Filled 2016-08-13 (×2): qty 4

## 2016-08-13 MED ORDER — FUROSEMIDE 10 MG/ML IJ SOLN
40.0000 mg | Freq: Once | INTRAMUSCULAR | Status: AC
  Administered 2016-08-13: 40 mg via INTRAVENOUS
  Filled 2016-08-13: qty 4

## 2016-08-13 MED ORDER — HYDROCODONE-ACETAMINOPHEN 5-325 MG PO TABS
1.0000 | ORAL_TABLET | Freq: Four times a day (QID) | ORAL | Status: DC
  Administered 2016-08-13 – 2016-08-14 (×2): 1 via ORAL
  Administered 2016-08-14: 2 via ORAL
  Administered 2016-08-14 (×2): 1 via ORAL
  Administered 2016-08-15: 2 via ORAL
  Administered 2016-08-16: 1 via ORAL
  Administered 2016-08-16 – 2016-08-18 (×8): 2 via ORAL
  Filled 2016-08-13: qty 2
  Filled 2016-08-13: qty 1
  Filled 2016-08-13 (×3): qty 2
  Filled 2016-08-13: qty 1
  Filled 2016-08-13 (×3): qty 2
  Filled 2016-08-13 (×2): qty 1
  Filled 2016-08-13 (×2): qty 2
  Filled 2016-08-13: qty 1
  Filled 2016-08-13 (×2): qty 2

## 2016-08-13 MED ORDER — POTASSIUM CHLORIDE CRYS ER 20 MEQ PO TBCR
40.0000 meq | EXTENDED_RELEASE_TABLET | Freq: Once | ORAL | Status: AC
  Administered 2016-08-13: 20 meq via ORAL
  Filled 2016-08-13: qty 2

## 2016-08-13 MED ORDER — ALBUTEROL SULFATE (2.5 MG/3ML) 0.083% IN NEBU
2.5000 mg | INHALATION_SOLUTION | RESPIRATORY_TRACT | Status: DC | PRN
  Administered 2016-08-13 – 2016-08-18 (×2): 2.5 mg via RESPIRATORY_TRACT
  Filled 2016-08-13 (×2): qty 3

## 2016-08-13 MED ORDER — POTASSIUM CHLORIDE CRYS ER 20 MEQ PO TBCR
40.0000 meq | EXTENDED_RELEASE_TABLET | Freq: Once | ORAL | Status: AC
  Administered 2016-08-13: 40 meq via ORAL
  Filled 2016-08-13: qty 2

## 2016-08-13 NOTE — Progress Notes (Signed)
Paged by RN for respiratory distress and chest pain. Per report, patient experienced a coughing attack with desaturation to 86. She was suctioned and rapid response was called to bedside. She was placed on a non re breather with improvement in her sats. Went to bedside to evaluate. Patient is tachypneic with RR ~ 30. On exam she has bilateral expiratory wheezing and bibasilar crackles (R>L). Patient was placed back on [place] but requiring 5L to maintain sats in the low 90s. Ordered STAT CXR, IV lasix, EKG, and albuterol nebs. Will continue to monitor.   Velna Ochs, M.D. Pager: [phone] 08/13/2016, 4:38 AM

## 2016-08-13 NOTE — Progress Notes (Addendum)
Subjective: [name] reports that her pain nausea is well controlled with medications this morning, she is having pain in her foot this morning but her scheduled norco is charted as refused overnight, I think this may be related to her language barrier.   Overnight she did become hypoxic and tachypneic after a coughing fit, EKG was unchanged from prior but chest xray revealed pulmonary edema and pleural effusions.   Objective:  Vital signs in last 24 hours: Vitals:   08/12/16 0900 08/12/16 1500 08/12/16 2043 08/13/16 0337  BP:  (!) 130/50 (!) 148/57 (!) 150/60  Pulse:  74 81 72  Resp:  19 18 (!) 30  Temp:  99 F (37.2 C) 100.3 F (37.9 C) 99 F (37.2 C)  TempSrc:  Oral Oral Oral  SpO2:  90% 90% 91%  Weight: 144 lb 6.4 oz (65.5 kg)      General Apperance: NAD HEENT: Normocephalic, atraumatic, anicteric sclera Neck: Supple, trachea midline Lungs: On nasal canula Clear to auscultation bilaterally. No wheezes or rales. no peripheral edema. Chest wall tenderness  Heart: Regular rate and rhythm, no murmur/rub/gallop Abdomen: Soft, nontender, nondistended, no rebound/guarding Extremities: Warm and well perfused, R BKA, LLE in dressing Skin: No rashes Neurologic: Alert and interactive. No gross deficits.   Assessment/Plan: 56 year old [name] speaking woman with HTN, HLD, PAD, uncontrolled DM presenting with left toe osteomyelitis and systemic symptoms of fever, weakness, poor appetite, and dizziness admitted for IV antibiotics and surgical management.   Left Toe Osteomyelitis: She was scheduled for AKA today however with her pulmonary surgery feels unsure about whether she will be able to handle intubation and her surgery will be rescheduled for tomorrow.  - Nothing by mouth at midnight -- f/u Blood Cx >> No growth 5 days.  - surgical culture >> polymicrobial, citrobacter freudii  -- Cont vanc, ceftriaxone and flagyl 6/19 >> this can be discontinued when we achieve source  control  -- Norco 5-325 1-2 tabs decreased to q6hours scheduled   -- IV Dilaudid 1 mg q4 hours prn  -- Senakot-s, scheduled  - Zofran PRN   Volume overload  She has developed pulmonary edema is evident on chest x-ray indicates again requiring nasal cannula oxygen today. She had been having incontinence and intakes and outputs were not recorded but has a foley placed overnight.  - ordered IV lasix 40 mg BID x2 doses  -follow up chest xray tomorrow morning   Hypokalemia Hypomagnesemia  K 3.0 today  - ordered K - dur 40 meq x2  -Continue daily BMP  HTN:  Hypertensive on home med amlodipine today, I anticipate that this should improve with diuresis.   -Continue home medication amlodipine 10 mg daily -will hold off on restarting HCTZ for now but this can be restarted if her blood pressure continues to rise  DM, Uncontrolled Last A1c 11.4, home meds include metformin 1000 mg qd and glipizide 7.5 mg qAM. Her blood glucose corrected to the 150s overnight.  - increased to resistant sliding scale  -continue novolog 6 units with meals  -continue Lantus to 16 units daily    Hyperlipidemia  - continue atorvastatin 40 mg qd   PVD: S/p right BKA and left SFA stenting  --Will hold off on restarting ASA and plavix so as to not interfere with surgery Saturday but I hope to restart these 24 hours after surgery  Depression: -- Continue home Celexa 20 mg   Low albumin Nutrition was consulted and ordered Multivitamins and boost ordered  VTE ppx: Lovenox  Code Status: FULL   Dispo: Anticipated discharge in approximately 3-4 day(s).   Ledell Noss, MD  08/13/2016, 11:31 AM Pager: [phone]

## 2016-08-13 NOTE — Anesthesia Preprocedure Evaluation (Deleted)
Anesthesia Evaluation  Patient identified by MRN, date of birth, ID band Patient awake    Reviewed: Allergy & Precautions, H&P , NPO status , Patient's Chart, lab work & pertinent test results  Airway        Dental no notable dental hx.    Pulmonary neg pulmonary ROS,    Pulmonary exam normal        Cardiovascular hypertension, Pt. on medications + Peripheral Vascular Disease       Neuro/Psych Anxiety negative neurological ROS  negative psych ROS   GI/Hepatic negative GI ROS, Neg liver ROS,   Endo/Other  diabetes, Type 2, Oral Hypoglycemic Agents  Renal/GU negative Renal ROS  negative genitourinary   Musculoskeletal  (+) Arthritis , Osteoarthritis,    Abdominal   Peds  Hematology negative hematology ROS (+) anemia ,   Anesthesia Other Findings   Reproductive/Obstetrics negative OB ROS                             Anesthesia Physical Anesthesia Plan  ASA: III  Anesthesia Plan: General   Post-op Pain Management:    Induction: Intravenous  PONV Risk Score and Plan: 4 or greater and Ondansetron, Dexamethasone, Propofol and Midazolam  Airway Management Planned: LMA  Additional Equipment:   Intra-op Plan:   Post-operative Plan: Extubation in OR  Informed Consent: I have reviewed the patients History and Physical, chart, labs and discussed the procedure including the risks, benefits and alternatives for the proposed anesthesia with the patient or authorized representative who has indicated his/her understanding and acceptance.   Dental advisory given  Plan Discussed with: CRNA  Anesthesia Plan Comments:         Anesthesia Quick Evaluation

## 2016-08-13 NOTE — Progress Notes (Signed)
MD from Internal med  assessed the patient and ordered lasix, chest x-ray , EKG and Nebulizer treatment.Nasal canula 5L was placed on patient with O2 sat at 92%. Pt is being monitored

## 2016-08-13 NOTE — Progress Notes (Signed)
RN by name Ginette Pitman who works with [facility] came to the unit to get updates on patient. She stated that patient primary MD Dorian Pod had given her updates on patient. She stated that patient is a member of [facility] facility.

## 2016-08-13 NOTE — Progress Notes (Addendum)
RN heard pt coughing and on assessment patient was choking on her  saliva. Pt was suctioned and VS obtained. Patient was tachypneic, Resp 30 and  desaturate to 88. Non-Rebreather mask  Applied. Rapid was notified and on  assessment  MD for internnal Med was notified.

## 2016-08-13 NOTE — Progress Notes (Signed)
  Progress Note    08/13/2016 10:21 AM 4 Days Post-Op  Subjective:  rrt called last night, now stable on 5L with O2 sats 90%  Vitals:   08/12/16 2043 08/13/16 0337  BP: (!) 148/57 (!) 150/60  Pulse: 81 72  Resp: 18 (!) 30  Temp: 100.3 F (37.9 C) 99 F (37.2 C)    Physical Exam: Awake and alert Left foot wound is foul smelling, necrosis on plantar aspect back to heel, no erythema  CBC    Component Value Date/Time   WBC 25.0 (H) 08/13/2016 0348   RBC 3.34 (L) 08/13/2016 0348   HGB 8.3 (L) 08/13/2016 0348   HCT 25.9 (L) 08/13/2016 0348   PLT 464 (H) 08/13/2016 0348   MCV 77.5 (L) 08/13/2016 0348   MCH 24.9 (L) 08/13/2016 0348   MCHC 32.0 08/13/2016 0348   RDW 14.7 08/13/2016 0348   LYMPHSABS 2.4 08/10/2016 0533   MONOABS 1.9 (H) 08/10/2016 0533   EOSABS 0.0 08/10/2016 0533   BASOSABS 0.0 08/10/2016 0533    BMET    Component Value Date/Time   NA 137 08/13/2016 0348   K 3.0 (L) 08/13/2016 0348   CL 103 08/13/2016 0348   CO2 25 08/13/2016 0348   GLUCOSE 143 (H) 08/13/2016 0348   BUN 13 08/13/2016 0348   CREATININE 0.84 08/13/2016 0348   CREATININE 0.58 12/18/2013 1515   CALCIUM 8.2 (L) 08/13/2016 0348   GFRNONAA >60 08/13/2016 0348   GFRAA >60 08/13/2016 0348    INR    Component Value Date/Time   INR 1.18 08/13/2016 0348     Intake/Output Summary (Last 24 hours) at 08/13/16 1021 Last data filed at 08/12/16 1447  Gross per 24 hour  Intake              240 ml  Output                0 ml  Net              240 ml     Assessment:  56 y.o. female is s/p left 1st toe amputation and will need left aka. Persistent leukocytosis with stable necrosis of much of left foot. Does not appear to be progressing or be driving her respiratory issues  Plan: OR cancelled today secondary to respiratory issues and concern to intubate her at this time. Re-evaluate her tomorrow and possible left above knee amputation on Monday.    Quanna Wittke C. Donzetta Matters, MD [facility] [place] Office: [phone] Pager: [phone]  08/13/2016 10:21 AM

## 2016-08-13 NOTE — Significant Event (Signed)
Rapid Response Event Note  Overview: Time Called: 0354 Arrival Time: 0359 Event Type: Respiratory  Initial Focused Assessment: Respiratory distress   Interventions: Increased O2  Plan of Care (if not transferred):  Event Summary: Called to assist with care of patient having some respiratory difficulty. Reported she had a coughing episode with moderate chest congestion. O2 sats dropped to 86 when off of O2. Lungs sounds were coarse but cleared with coughing. Skin is warm and dry. Heart sounds regular. Patient appears slightly drowsy but answers questions appropriately. Also reports having left side chest pain that increases on palpation. No shortness of breath, nausea or dizziness. Advised unit RN to notify on-call provider of patient complaints. O2 was increase to NRB mask at 15 liters. Recommended PCXR as well.        Eino Farber [place]

## 2016-08-13 NOTE — Progress Notes (Signed)
  Date: 08/13/2016  Patient name: Kimberly Lane  Medical record number: [id number]  Date of birth: 18-Dec-1960   I have seen and evaluated this patient and I have discussed the plan of care with the house staff. Please see Dr. Fredrik Cove note for complete details. I concur with her findings.   Agree with plan to assist with volume overload.  Will also try to wean Oxygen to off with goal O2 saturation > 92%  Sid Falcon, MD 08/13/2016, 12:20 PM

## 2016-08-14 ENCOUNTER — Inpatient Hospital Stay (HOSPITAL_COMMUNITY): Payer: No Typology Code available for payment source

## 2016-08-14 LAB — GLUCOSE, CAPILLARY
GLUCOSE-CAPILLARY: 199 mg/dL — AB (ref 65–99)
GLUCOSE-CAPILLARY: 271 mg/dL — AB (ref 65–99)
GLUCOSE-CAPILLARY: 262 mg/dL — AB (ref 65–99)
Glucose-Capillary: 293 mg/dL — ABNORMAL HIGH (ref 65–99)
Glucose-Capillary: 300 mg/dL — ABNORMAL HIGH (ref 65–99)
Glucose-Capillary: 352 mg/dL — ABNORMAL HIGH (ref 65–99)

## 2016-08-14 LAB — BASIC METABOLIC PANEL
ANION GAP: 9 (ref 5–15)
BUN: 9 mg/dL (ref 6–20)
CALCIUM: 8.1 mg/dL — AB (ref 8.9–10.3)
CO2: 28 mmol/L (ref 22–32)
Chloride: 101 mmol/L (ref 101–111)
Creatinine, Ser: 0.81 mg/dL (ref 0.44–1.00)
GFR calc Af Amer: >60 mL/min (ref >60)
GLUCOSE: 318 mg/dL — AB (ref 65–99)
POTASSIUM: 3.4 mmol/L — AB (ref 3.5–5.1)
Sodium: 138 mmol/L (ref 135–145)

## 2016-08-14 LAB — MAGNESIUM: MAGNESIUM: 1.2 mg/dL — AB (ref 1.7–2.4)

## 2016-08-14 MED ORDER — POTASSIUM CHLORIDE CRYS ER 20 MEQ PO TBCR
40.0000 meq | EXTENDED_RELEASE_TABLET | Freq: Once | ORAL | Status: AC
  Administered 2016-08-14: 40 meq via ORAL
  Filled 2016-08-14: qty 2

## 2016-08-14 MED ORDER — MAGNESIUM SULFATE 4 GM/100ML IV SOLN
4.0000 g | Freq: Once | INTRAVENOUS | Status: AC
  Administered 2016-08-14: 4 g via INTRAVENOUS
  Filled 2016-08-14: qty 100

## 2016-08-14 MED ORDER — FUROSEMIDE 10 MG/ML IJ SOLN
20.0000 mg | Freq: Two times a day (BID) | INTRAMUSCULAR | Status: AC
  Administered 2016-08-14 (×2): 20 mg via INTRAVENOUS
  Filled 2016-08-14 (×2): qty 2

## 2016-08-14 MED ORDER — DEXTROSE 5 % IV SOLN: 1.5000 g | INTRAVENOUS | Status: DC

## 2016-08-14 MED ORDER — CEFUROXIME SODIUM 1.5 G IV SOLR
1.5000 g | INTRAVENOUS | Status: DC
  Filled 2016-08-14: qty 1.5

## 2016-08-14 NOTE — Progress Notes (Signed)
  Progress Note    08/14/2016 10:25 AM 5 Days Post-Op  Subjective: improved breathing from yesterday, no complaints today  Vitals:   08/13/16 2050 08/14/16 0444  BP: (!) 144/62 (!) 150/66  Pulse: 78 76  Resp: 18 18  Temp: 98.6 F (37 C) 98.3 F (36.8 C)    Physical Exam: Awake and alert Left foot with more purulence drained, no erythema  CBC    Component Value Date/Time   WBC 25.0 (H) 08/13/2016 0348   RBC 3.34 (L) 08/13/2016 0348   HGB 8.3 (L) 08/13/2016 0348   HCT 25.9 (L) 08/13/2016 0348   PLT 464 (H) 08/13/2016 0348   MCV 77.5 (L) 08/13/2016 0348   MCH 24.9 (L) 08/13/2016 0348   MCHC 32.0 08/13/2016 0348   RDW 14.7 08/13/2016 0348   LYMPHSABS 2.4 08/10/2016 0533   MONOABS 1.9 (H) 08/10/2016 0533   EOSABS 0.0 08/10/2016 0533   BASOSABS 0.0 08/10/2016 0533    BMET    Component Value Date/Time   NA 138 08/14/2016 0340   K 3.4 (L) 08/14/2016 0340   CL 101 08/14/2016 0340   CO2 28 08/14/2016 0340   GLUCOSE 318 (H) 08/14/2016 0340   BUN 9 08/14/2016 0340   CREATININE 0.81 08/14/2016 0340   CREATININE 0.58 12/18/2013 1515   CALCIUM 8.1 (L) 08/14/2016 0340   GFRNONAA >60 08/14/2016 0340   GFRAA >60 08/14/2016 0340    INR    Component Value Date/Time   INR 1.18 08/13/2016 0348     Intake/Output Summary (Last 24 hours) at 08/14/16 1025 Last data filed at 08/14/16 0445  Gross per 24 hour  Intake              120 ml  Output             2500 ml  Net            -2380 ml     Assessment:  56 y.o. female is s/p left 1st toe amputation and will need left aka. Persistent leukocytosis with stable necrosis of much of left foot. repiratory status improved from yesterday now off O2.   Plan: OR tomorrow for left AKA.     Maveryk Renstrom C. Donzetta Matters, MD [facility] [place] Office: [phone] Pager: [phone]  08/14/2016 10:25 AM

## 2016-08-14 NOTE — Progress Notes (Signed)
Subjective:  Patient seen and examined this morning.  No acute events overnight noted.  She is in her room accompanied by a relative who speaks some [place].  He reports no new changes.    Objective:  Vital signs in last 24 hours: Vitals:   08/13/16 1600 08/13/16 2050 08/14/16 0444 08/14/16 0559  BP:  (!) 144/62 (!) 150/66   Pulse:  78 76   Resp:  18 18   Temp:  98.6 F (37 C) 98.3 F (36.8 C)   TempSrc:  Oral Oral   SpO2: 100% 100% 100%   Weight:    138 lb 14.2 oz (63 kg)   General Apperance: NAD, lying in bed, appears comfortable HEENT: Normocephalic, atraumatic, anicteric sclera Lungs: On room air. Clear to auscultation bilaterally anteriorly Heart: Regular rate and rhythm, no murmur/rub/gallop Abdomen: Soft, nontender, nondistended, no rebound/guarding Extremities: Warm and well perfused, R BKA, LLE in dressing.  Dressing is clean. Skin: No rashes Neurologic: Alert and interactive. No gross deficits.   Assessment/Plan: 56 year old [name] speaking woman with HTN, HLD, PAD, uncontrolled DM presenting with left toe osteomyelitis and systemic symptoms of fever, weakness, poor appetite, and dizziness admitted for IV antibiotics and surgical management.   Left Toe Osteomyelitis: AKA yesterday had to be cancelled due to respiratory issues.  She received Lasix 40mg  IV x 2 doses yesterday.  Her oxygen saturation this AM is 100% on RA.  Repeat CXR with improved aeration and slightly decreased edema.  Hopefully, surgery will be able to be done tomorrow with improvement in her respiratory status. - Carb mod diet today.  NPO at [place] - Blood cx finalized as no growth - surgical culture >> polymicrobial, citrobacter freudii  - Cont vanc, ceftriaxone and flagyl 6/19 >> this can be discontinued when we achieve source control  - Norco 5-325 1-2 tabs q6hours scheduled   - IV Dilaudid 1 mg q4 hours prn  - Senakot-s, scheduled  - Zofran PRN   Volume overload  She developed some  pulmonary edema that resulted in her surgery cancellation yesterday.  CXR today shows some improvement, oxygen requirement has improved and currently on room air at 100%.  Creatinine stable.  Potassium 3.4. - ordered IV lasix 20 mg BID x2 doses today to optimize volume status - continue Foley  Hypokalemia Hypomagnesemia  K 3.4 today  - ordered K - dur 40 meq x 1 - Continue daily BMP - Check magnesium level  HTN:  150/66 this morning -Continue home medication amlodipine 10 mg daily -will hold off on restarting HCTZ for now but this can be restarted if her blood pressure continues to rise or resume after surgery  DM, Uncontrolled Last A1c 11.4, home meds include metformin 1000 mg qd and glipizide 7.5 mg qAM. Her blood glucose this morning is 199  - resistant sliding scale  - continue novolog 6 units with meals  - continue Lantus to 16 units daily    Hyperlipidemia  - continue atorvastatin 40 mg qd   PVD: S/p right BKA and left SFA stenting  --Will hold off on restarting ASA and plavix so as to not interfere with surgery Monday.  Plan to restart these 24 hours after surgery.  Discuss with VVS regarding timing.  Depression: - Continue home Celexa 20 mg   Low albumin - Nutrition was consulted and ordered Multivitamins and boost ordered   VTE ppx: Lovenox  Code Status: FULL   Dispo: Anticipated discharge in approximately 3-4 day(s).   Jule Ser, DO  08/14/2016, 10:02 AM Pager: [phone]

## 2016-08-15 ENCOUNTER — Inpatient Hospital Stay (HOSPITAL_COMMUNITY): Payer: No Typology Code available for payment source

## 2016-08-15 ENCOUNTER — Inpatient Hospital Stay (HOSPITAL_COMMUNITY): Payer: No Typology Code available for payment source | Admitting: Certified Registered"

## 2016-08-15 ENCOUNTER — Encounter (HOSPITAL_COMMUNITY)
Admission: EM | Disposition: A | Discharge status: Rehabilitation Facility | Payer: Self-pay | Source: Home / Self Care | Attending: Internal Medicine

## 2016-08-15 DIAGNOSIS — J95821 Acute postprocedural respiratory failure: Secondary | ICD-10-CM

## 2016-08-15 DIAGNOSIS — J810 Acute pulmonary edema: Secondary | ICD-10-CM

## 2016-08-15 DIAGNOSIS — R0600 Dyspnea, unspecified: Secondary | ICD-10-CM

## 2016-08-15 DIAGNOSIS — I96 Gangrene, not elsewhere classified: Secondary | ICD-10-CM

## 2016-08-15 DIAGNOSIS — R1013 Epigastric pain: Secondary | ICD-10-CM

## 2016-08-15 HISTORY — PX: AMPUTATION: SHX166

## 2016-08-15 LAB — POCT I-STAT 3, ART BLOOD GAS (G3+)
ACID-BASE EXCESS: 2 mmol/L (ref 0.0–2.0)
Bicarbonate: 25.3 mmol/L (ref 20.0–28.0)
O2 SAT: 96 %
TCO2: 26 mmol/L (ref 0–100)
pCO2 arterial: 34.6 mmHg (ref 32.0–48.0)
pH, Arterial: 7.469 — ABNORMAL HIGH (ref 7.350–7.450)
pO2, Arterial: 78 mmHg — ABNORMAL LOW (ref 83.0–108.0)

## 2016-08-15 LAB — CBC
HCT: 25.9 % — ABNORMAL LOW (ref 36.0–46.0)
HEMOGLOBIN: 8.5 g/dL — AB (ref 12.0–15.0)
MCH: 25 pg — AB (ref 26.0–34.0)
MCHC: 32.8 g/dL (ref 30.0–36.0)
MCV: 76.2 fL — AB (ref 78.0–100.0)
Platelets: 445 10*3/uL — ABNORMAL HIGH (ref 150–400)
RBC: 3.4 MIL/uL — ABNORMAL LOW (ref 3.87–5.11)
RDW: 14.7 % (ref 11.5–15.5)
WBC: 21.8 10*3/uL — AB (ref 4.0–10.5)

## 2016-08-15 LAB — BRAIN NATRIURETIC PEPTIDE: B NATRIURETIC PEPTIDE 5: 406.1 pg/mL — AB (ref 0.0–100.0)

## 2016-08-15 LAB — GLUCOSE, CAPILLARY
GLUCOSE-CAPILLARY: 227 mg/dL — AB (ref 65–99)
GLUCOSE-CAPILLARY: 281 mg/dL — AB (ref 65–99)
GLUCOSE-CAPILLARY: 326 mg/dL — AB (ref 65–99)
Glucose-Capillary: 190 mg/dL — ABNORMAL HIGH (ref 65–99)
Glucose-Capillary: 410 mg/dL — ABNORMAL HIGH (ref 65–99)

## 2016-08-15 LAB — BASIC METABOLIC PANEL
ANION GAP: 12 (ref 5–15)
Anion gap: 13 (ref 5–15)
BUN: 7 mg/dL (ref 6–20)
BUN: 9 mg/dL (ref 6–20)
CALCIUM: 8.1 mg/dL — AB (ref 8.9–10.3)
CALCIUM: 8.1 mg/dL — AB (ref 8.9–10.3)
CHLORIDE: 101 mmol/L (ref 101–111)
CHLORIDE: 100 mmol/L — AB (ref 101–111)
CO2: 25 mmol/L (ref 22–32)
CO2: 24 mmol/L (ref 22–32)
CREATININE: 0.98 mg/dL (ref 0.44–1.00)
Creatinine, Ser: 0.73 mg/dL (ref 0.44–1.00)
GFR calc non Af Amer: >60 mL/min (ref >60)
Glucose, Bld: 232 mg/dL — ABNORMAL HIGH (ref 65–99)
Glucose, Bld: 402 mg/dL — ABNORMAL HIGH (ref 65–99)
Potassium: 3.1 mmol/L — ABNORMAL LOW (ref 3.5–5.1)
Potassium: 3.6 mmol/L (ref 3.5–5.1)
SODIUM: 137 mmol/L (ref 135–145)
Sodium: 138 mmol/L (ref 135–145)

## 2016-08-15 LAB — VANCOMYCIN, TROUGH: VANCOMYCIN TR: 17 ug/mL (ref 15–20)

## 2016-08-15 LAB — TROPONIN I: TROPONIN I: 0.08 ng/mL — AB (ref <0.03)

## 2016-08-15 LAB — MAGNESIUM: MAGNESIUM: 1.8 mg/dL (ref 1.7–2.4)

## 2016-08-15 SURGERY — AMPUTATION, ABOVE KNEE
Anesthesia: General | Site: Leg Upper | Laterality: Left

## 2016-08-15 MED ORDER — HYDROCHLOROTHIAZIDE 25 MG PO TABS
25.0000 mg | ORAL_TABLET | Freq: Every day | ORAL | Status: DC
  Administered 2016-08-16 – 2016-08-17 (×2): 25 mg via ORAL
  Filled 2016-08-15 (×2): qty 1

## 2016-08-15 MED ORDER — ENOXAPARIN SODIUM 40 MG/0.4ML ~~LOC~~ SOLN
40.0000 mg | SUBCUTANEOUS | Status: DC
  Administered 2016-08-16 – 2016-08-19 (×4): 40 mg via SUBCUTANEOUS
  Filled 2016-08-15 (×4): qty 0.4

## 2016-08-15 MED ORDER — PHENYLEPHRINE 40 MCG/ML (10ML) SYRINGE FOR IV PUSH (FOR BLOOD PRESSURE SUPPORT)
PREFILLED_SYRINGE | INTRAVENOUS | Status: DC | PRN
  Administered 2016-08-15: 120 ug via INTRAVENOUS
  Administered 2016-08-15: 80 ug via INTRAVENOUS

## 2016-08-15 MED ORDER — INSULIN GLARGINE 100 UNIT/ML ~~LOC~~ SOLN
20.0000 [IU] | Freq: Every day | SUBCUTANEOUS | Status: DC
  Administered 2016-08-15 – 2016-08-17 (×3): 20 [IU] via SUBCUTANEOUS
  Filled 2016-08-15 (×4): qty 0.2

## 2016-08-15 MED ORDER — PROPOFOL 10 MG/ML IV BOLUS
INTRAVENOUS | Status: DC | PRN
  Administered 2016-08-15: 110 mg via INTRAVENOUS
  Administered 2016-08-15: 20 mg via INTRAVENOUS

## 2016-08-15 MED ORDER — METOPROLOL TARTRATE 5 MG/5ML IV SOLN: 2.0000 mg | INTRAVENOUS | Status: DC | PRN

## 2016-08-15 MED ORDER — ONDANSETRON HCL 4 MG/2ML IJ SOLN
INTRAMUSCULAR | Status: AC
  Filled 2016-08-15: qty 2

## 2016-08-15 MED ORDER — LABETALOL HCL 5 MG/ML IV SOLN: 10.0000 mg | INTRAVENOUS | Status: DC | PRN

## 2016-08-15 MED ORDER — ROCURONIUM BROMIDE 10 MG/ML (PF) SYRINGE
PREFILLED_SYRINGE | INTRAVENOUS | Status: AC
  Filled 2016-08-15: qty 5

## 2016-08-15 MED ORDER — HYDROMORPHONE HCL 1 MG/ML IJ SOLN: 0.2500 mg | INTRAMUSCULAR | Status: DC | PRN

## 2016-08-15 MED ORDER — MIDAZOLAM HCL 2 MG/2ML IJ SOLN
2.0000 mg | INTRAMUSCULAR | Status: DC | PRN
  Administered 2016-08-16: 2 mg via INTRAVENOUS
  Filled 2016-08-15: qty 2

## 2016-08-15 MED ORDER — ALBUTEROL SULFATE (2.5 MG/3ML) 0.083% IN NEBU
2.5000 mg | INHALATION_SOLUTION | Freq: Four times a day (QID) | RESPIRATORY_TRACT | Status: DC | PRN
  Administered 2016-08-15: 2.5 mg via RESPIRATORY_TRACT

## 2016-08-15 MED ORDER — FENTANYL CITRATE (PF) 100 MCG/2ML IJ SOLN
50.0000 ug | INTRAMUSCULAR | Status: DC | PRN
  Administered 2016-08-15 (×2): 100 ug via INTRAVENOUS
  Filled 2016-08-15 (×3): qty 2

## 2016-08-15 MED ORDER — MIDAZOLAM HCL 2 MG/2ML IJ SOLN
1.0000 mg | Freq: Once | INTRAMUSCULAR | Status: AC
  Administered 2016-08-15: 1 mg via INTRAVENOUS

## 2016-08-15 MED ORDER — SUCCINYLCHOLINE CHLORIDE 200 MG/10ML IV SOSY
PREFILLED_SYRINGE | INTRAVENOUS | Status: DC | PRN
  Administered 2016-08-15: 80 mg via INTRAVENOUS

## 2016-08-15 MED ORDER — MIDAZOLAM HCL 2 MG/2ML IJ SOLN
INTRAMUSCULAR | Status: AC
  Filled 2016-08-15: qty 2

## 2016-08-15 MED ORDER — LIDOCAINE 2% (20 MG/ML) 5 ML SYRINGE
INTRAMUSCULAR | Status: DC | PRN
  Administered 2016-08-15: 60 mg via INTRAVENOUS

## 2016-08-15 MED ORDER — SUCCINYLCHOLINE CHLORIDE 200 MG/10ML IV SOSY
PREFILLED_SYRINGE | INTRAVENOUS | Status: AC
  Filled 2016-08-15: qty 10

## 2016-08-15 MED ORDER — PROMETHAZINE HCL 25 MG/ML IJ SOLN: 6.2500 mg | INTRAMUSCULAR | Status: DC | PRN

## 2016-08-15 MED ORDER — LIDOCAINE 2% (20 MG/ML) 5 ML SYRINGE
INTRAMUSCULAR | Status: AC
  Filled 2016-08-15: qty 5

## 2016-08-15 MED ORDER — MIDAZOLAM BOLUS VIA INFUSION: 1.0000 mg | Freq: Once | INTRAVENOUS | Status: DC

## 2016-08-15 MED ORDER — ALBUTEROL SULFATE HFA 108 (90 BASE) MCG/ACT IN AERS
INHALATION_SPRAY | RESPIRATORY_TRACT | Status: AC
  Filled 2016-08-15: qty 6.7

## 2016-08-15 MED ORDER — GUAIFENESIN-DM 100-10 MG/5ML PO SYRP
15.0000 mL | ORAL_SOLUTION | ORAL | Status: DC | PRN
  Administered 2016-08-18 – 2016-08-19 (×5): 15 mL via ORAL
  Filled 2016-08-15 (×5): qty 15

## 2016-08-15 MED ORDER — HYDRALAZINE HCL 20 MG/ML IJ SOLN: 5.0000 mg | INTRAMUSCULAR | Status: DC | PRN

## 2016-08-15 MED ORDER — PHENOL 1.4 % MT LIQD: 1.0000 | OROMUCOSAL | Status: DC | PRN

## 2016-08-15 MED ORDER — SUGAMMADEX SODIUM 200 MG/2ML IV SOLN
INTRAVENOUS | Status: AC
  Filled 2016-08-15: qty 2

## 2016-08-15 MED ORDER — MAGNESIUM SULFATE 2 GM/50ML IV SOLN: 2.0000 g | Freq: Every day | INTRAVENOUS | Status: DC | PRN

## 2016-08-15 MED ORDER — FENTANYL CITRATE (PF) 100 MCG/2ML IJ SOLN
50.0000 ug | Freq: Once | INTRAMUSCULAR | Status: AC
  Administered 2016-08-15: 50 ug via INTRAVENOUS

## 2016-08-15 MED ORDER — PANTOPRAZOLE SODIUM 40 MG IV SOLR
40.0000 mg | Freq: Every day | INTRAVENOUS | Status: DC
  Administered 2016-08-16 – 2016-08-17 (×2): 40 mg via INTRAVENOUS
  Filled 2016-08-15 (×2): qty 40

## 2016-08-15 MED ORDER — ONDANSETRON HCL 4 MG/2ML IJ SOLN
INTRAMUSCULAR | Status: DC | PRN
  Administered 2016-08-15: 4 mg via INTRAVENOUS

## 2016-08-15 MED ORDER — MIDAZOLAM HCL 2 MG/2ML IJ SOLN
INTRAMUSCULAR | Status: AC
  Administered 2016-08-15: 1 mg via INTRAVENOUS
  Filled 2016-08-15: qty 2

## 2016-08-15 MED ORDER — MIDAZOLAM HCL 2 MG/2ML IJ SOLN
2.0000 mg | INTRAMUSCULAR | Status: DC | PRN
  Administered 2016-08-15 – 2016-08-16 (×3): 2 mg via INTRAVENOUS
  Filled 2016-08-15 (×4): qty 2

## 2016-08-15 MED ORDER — LACTATED RINGERS IV SOLN
INTRAVENOUS | Status: DC
  Administered 2016-08-15: 12:00:00 via INTRAVENOUS

## 2016-08-15 MED ORDER — FENTANYL CITRATE (PF) 100 MCG/2ML IJ SOLN
INTRAMUSCULAR | Status: AC
  Administered 2016-08-15: 50 ug via INTRAVENOUS
  Filled 2016-08-15: qty 2

## 2016-08-15 MED ORDER — POTASSIUM CHLORIDE CRYS ER 20 MEQ PO TBCR
40.0000 meq | EXTENDED_RELEASE_TABLET | Freq: Two times a day (BID) | ORAL | Status: AC
  Administered 2016-08-15: 40 meq via ORAL
  Filled 2016-08-15 (×2): qty 2

## 2016-08-15 MED ORDER — PHENYLEPHRINE 40 MCG/ML (10ML) SYRINGE FOR IV PUSH (FOR BLOOD PRESSURE SUPPORT)
PREFILLED_SYRINGE | INTRAVENOUS | Status: AC
  Filled 2016-08-15: qty 20

## 2016-08-15 MED ORDER — POTASSIUM CHLORIDE CRYS ER 20 MEQ PO TBCR: 20.0000 meq | EXTENDED_RELEASE_TABLET | Freq: Every day | ORAL | Status: DC | PRN

## 2016-08-15 MED ORDER — PHENYLEPHRINE 40 MCG/ML (10ML) SYRINGE FOR IV PUSH (FOR BLOOD PRESSURE SUPPORT)
PREFILLED_SYRINGE | INTRAVENOUS | Status: AC
  Filled 2016-08-15: qty 10

## 2016-08-15 MED ORDER — MIDAZOLAM HCL 2 MG/2ML IJ SOLN
1.0000 mg | Freq: Once | INTRAMUSCULAR | Status: AC
  Administered 2016-08-15 (×2): 1 mg via INTRAVENOUS

## 2016-08-15 MED ORDER — FENTANYL CITRATE (PF) 100 MCG/2ML IJ SOLN
50.0000 ug | INTRAMUSCULAR | Status: DC | PRN
  Administered 2016-08-15 – 2016-08-17 (×8): 100 ug via INTRAVENOUS
  Filled 2016-08-15 (×9): qty 2

## 2016-08-15 MED ORDER — FENTANYL CITRATE (PF) 250 MCG/5ML IJ SOLN
INTRAMUSCULAR | Status: DC | PRN
  Administered 2016-08-15: 50 ug via INTRAVENOUS

## 2016-08-15 MED ORDER — MIDAZOLAM HCL 5 MG/5ML IJ SOLN
INTRAMUSCULAR | Status: DC | PRN
  Administered 2016-08-15: 1 mg via INTRAVENOUS
  Administered 2016-08-15: 2 mg via INTRAVENOUS
  Administered 2016-08-15: 1 mg via INTRAVENOUS

## 2016-08-15 MED ORDER — ALBUTEROL SULFATE (2.5 MG/3ML) 0.083% IN NEBU
INHALATION_SOLUTION | RESPIRATORY_TRACT | Status: AC
  Filled 2016-08-15: qty 3

## 2016-08-15 MED ORDER — DOCUSATE SODIUM 100 MG PO CAPS
100.0000 mg | ORAL_CAPSULE | Freq: Every day | ORAL | Status: DC
  Administered 2016-08-16 – 2016-08-19 (×4): 100 mg via ORAL
  Filled 2016-08-15 (×4): qty 1

## 2016-08-15 MED ORDER — DEXAMETHASONE SODIUM PHOSPHATE 10 MG/ML IJ SOLN
INTRAMUSCULAR | Status: DC | PRN
  Administered 2016-08-15: 5 mg via INTRAVENOUS

## 2016-08-15 MED ORDER — SUGAMMADEX SODIUM 200 MG/2ML IV SOLN
INTRAVENOUS | Status: DC | PRN
  Administered 2016-08-15: 130.2 mg via INTRAVENOUS

## 2016-08-15 MED ORDER — FENTANYL CITRATE (PF) 250 MCG/5ML IJ SOLN
INTRAMUSCULAR | Status: AC
  Filled 2016-08-15: qty 5

## 2016-08-15 MED ORDER — ALBUTEROL SULFATE HFA 108 (90 BASE) MCG/ACT IN AERS
INHALATION_SPRAY | RESPIRATORY_TRACT | Status: DC | PRN
  Administered 2016-08-15 (×3): 4 via RESPIRATORY_TRACT

## 2016-08-15 MED ORDER — FUROSEMIDE 10 MG/ML IJ SOLN
40.0000 mg | Freq: Once | INTRAMUSCULAR | Status: AC
  Administered 2016-08-15: 40 mg via INTRAVENOUS
  Filled 2016-08-15: qty 4

## 2016-08-15 MED ORDER — ROCURONIUM BROMIDE 10 MG/ML (PF) SYRINGE
PREFILLED_SYRINGE | INTRAVENOUS | Status: DC | PRN
  Administered 2016-08-15: 40 mg via INTRAVENOUS

## 2016-08-15 MED ORDER — DEXAMETHASONE SODIUM PHOSPHATE 10 MG/ML IJ SOLN
INTRAMUSCULAR | Status: AC
  Filled 2016-08-15: qty 1

## 2016-08-15 MED ORDER — METRONIDAZOLE 500 MG PO TABS
500.0000 mg | ORAL_TABLET | Freq: Three times a day (TID) | ORAL | Status: DC
  Administered 2016-08-15 – 2016-08-17 (×5): 500 mg via ORAL
  Filled 2016-08-15 (×6): qty 1

## 2016-08-15 MED ORDER — PROPOFOL 10 MG/ML IV BOLUS
INTRAVENOUS | Status: AC
  Filled 2016-08-15: qty 20

## 2016-08-15 MED ORDER — 0.9 % SODIUM CHLORIDE (POUR BTL) OPTIME
TOPICAL | Status: DC | PRN
  Administered 2016-08-15: 1000 mL

## 2016-08-15 SURGICAL SUPPLY — 47 items
BANDAGE ACE 4X5 VEL STRL LF (GAUZE/BANDAGES/DRESSINGS) ×3 IMPLANT
BANDAGE ACE 6X5 VEL STRL LF (GAUZE/BANDAGES/DRESSINGS) IMPLANT
BANDAGE ESMARK 6X9 LF (GAUZE/BANDAGES/DRESSINGS) IMPLANT
BLADE SAW GIGLI 510 (BLADE) ×2 IMPLANT
BLADE SAW GIGLI 510MM (BLADE) ×1
BNDG ESMARK 6X9 LF (GAUZE/BANDAGES/DRESSINGS)
BNDG GAUZE ELAST 4 BULKY (GAUZE/BANDAGES/DRESSINGS) ×3 IMPLANT
CANISTER SUCT 3000ML PPV (MISCELLANEOUS) ×3 IMPLANT
CLIP LIGATING EXTRA MED SLVR (CLIP) ×3 IMPLANT
CLIP LIGATING EXTRA SM BLUE (MISCELLANEOUS) IMPLANT
COVER SURGICAL LIGHT HANDLE (MISCELLANEOUS) ×6 IMPLANT
CUFF TOURNIQUET SINGLE 34IN LL (TOURNIQUET CUFF) IMPLANT
CUFF TOURNIQUET SINGLE 44IN (TOURNIQUET CUFF) IMPLANT
DRAIN SNY 10X20 3/4 PERF (WOUND CARE) IMPLANT
DRAPE HALF SHEET 40X57 (DRAPES) ×3 IMPLANT
DRAPE ORTHO SPLIT 77X108 STRL (DRAPES) ×4
DRAPE SURG ORHT 6 SPLT 77X108 (DRAPES) ×2 IMPLANT
ELECT REM PT RETURN 9FT ADLT (ELECTROSURGICAL) ×3
ELECTRODE REM PT RTRN 9FT ADLT (ELECTROSURGICAL) ×1 IMPLANT
EVACUATOR SILICONE 100CC (DRAIN) IMPLANT
GAUZE SPONGE 4X4 12PLY STRL (GAUZE/BANDAGES/DRESSINGS) ×3 IMPLANT
GAUZE XEROFORM 5X9 LF (GAUZE/BANDAGES/DRESSINGS) ×3 IMPLANT
GLOVE BIOGEL PI IND STRL 6.5 (GLOVE) ×2 IMPLANT
GLOVE BIOGEL PI IND STRL 7.0 (GLOVE) ×1 IMPLANT
GLOVE BIOGEL PI INDICATOR 6.5 (GLOVE) ×4
GLOVE BIOGEL PI INDICATOR 7.0 (GLOVE) ×2
GLOVE ECLIPSE 6.5 STRL STRAW (GLOVE) ×3 IMPLANT
GLOVE SS BIOGEL STRL SZ 7.5 (GLOVE) ×1 IMPLANT
GLOVE SUPERSENSE BIOGEL SZ 7.5 (GLOVE) ×2
GOWN STRL REUS W/ TWL LRG LVL3 (GOWN DISPOSABLE) ×4 IMPLANT
GOWN STRL REUS W/TWL LRG LVL3 (GOWN DISPOSABLE) ×8
KIT BASIN OR (CUSTOM PROCEDURE TRAY) ×3 IMPLANT
KIT ROOM TURNOVER OR (KITS) ×3 IMPLANT
NS IRRIG 1000ML POUR BTL (IV SOLUTION) ×3 IMPLANT
PACK GENERAL/GYN (CUSTOM PROCEDURE TRAY) ×3 IMPLANT
PAD ARMBOARD 7.5X6 YLW CONV (MISCELLANEOUS) ×6 IMPLANT
PADDING CAST COTTON 6X4 STRL (CAST SUPPLIES) IMPLANT
STAPLER VISISTAT 35W (STAPLE) IMPLANT
STOCKINETTE IMPERVIOUS LG (DRAPES) ×3 IMPLANT
SUT ETHILON 3 0 PS 1 (SUTURE) IMPLANT
SUT VIC AB 0 CT1 18XCR BRD 8 (SUTURE) ×2 IMPLANT
SUT VIC AB 0 CT1 8-18 (SUTURE) ×4
SUT VICRYL AB 2 0 TIES (SUTURE) ×3 IMPLANT
TOWEL OR 17X24 6PK STRL BLUE (TOWEL DISPOSABLE) ×3 IMPLANT
TOWEL OR 17X26 10 PK STRL BLUE (TOWEL DISPOSABLE) ×3 IMPLANT
UNDERPAD 30X30 (UNDERPADS AND DIAPERS) ×3 IMPLANT
WATER STERILE IRR 1000ML POUR (IV SOLUTION) ×3 IMPLANT

## 2016-08-15 NOTE — Anesthesia Procedure Notes (Signed)
Procedure Name: Intubation Date/Time: 08/15/2016 12:55 PM Performed by: Freddie Breech Pre-anesthesia Checklist: Patient identified, Emergency Drugs available, Suction available and Patient being monitored Patient Re-evaluated:Patient Re-evaluated prior to inductionOxygen Delivery Method: Circle System Utilized Preoxygenation: Pre-oxygenation with 100% oxygen Intubation Type: IV induction Ventilation: Mask ventilation without difficulty Laryngoscope Size: Mac and 4 Grade View: Grade I Tube type: Oral Tube size: 7.5 mm Number of attempts: 1 Airway Equipment and Method: Stylet and Oral airway Placement Confirmation: ETT inserted through vocal cords under direct vision,  positive ETCO2 and breath sounds checked- equal and bilateral Tube secured with: Tape Dental Injury: Teeth and Oropharynx as per pre-operative assessment

## 2016-08-15 NOTE — Op Note (Signed)
    OPERATIVE REPORT  DATE OF SURGERY: 08/15/2016  PATIENT: Kimberly Lane, 56 y.o. female MRN: [id number]  DOB: Feb 12, 1961  PRE-OPERATIVE DIAGNOSIS: Gangrene left foot  POST-OPERATIVE DIAGNOSIS:  Same  PROCEDURE: Left above-knee amputation  SURGEON:  Curt Jews, M.D.  PHYSICIAN ASSISTANT: Nurse  ANESTHESIA:  Gen.  EBL: 100 ml  Total I/O In: 300 [I.V.:300] Out: 500 [Urine:400; Blood:100]  BLOOD ADMINISTERED: None  DRAINS: None  SPECIMEN: Above-knee amputation specimen to pathology  COUNTS CORRECT:  YES  PLAN OF CARE: PACU   PATIENT DISPOSITION:  PACU - hemodynamically stable  PROCEDURE DETAILS: The patient was taken to the operative placed supine position where the area of the left leg prepped and draped in usual sterile fashion. A fishmouth type incision was made on the distal leg above the level of the knee. This was carried down through the fascia and muscle in line with skin incision with electrocautery. The muscle was all viable dissection extended down to the level of the femur. The sciatic nerve was ligated with 0 Vicryl ties and divided. The superficial femoral artery and femoral vein were ligated and divided. The periosteum was elevated on the femur and the femur was divided with a Gigli saw. The bone edges were smoothed with a bone rasp. Wounds irrigated with saline and hemostasis was obtained left cautery. Anterior fascia was closed the posterior fascia with interrupted 0 Vicryl figure-of-eight sutures. The wound was again irrigated with saline. The wound was closed with skin staples. Sterile dressing and Ace wrap were applied the patient was transferred to the recovery room in stable condition   Rosetta Posner, M.D., [facility] 08/15/2016 3:04 PM

## 2016-08-15 NOTE — Consult Note (Signed)
PULMONARY / CRITICAL CARE MEDICINE   Name: Kimberly Lane MRN: [id number] DOB: 01-Jun-1960    ADMISSION DATE:  08/08/2016 CONSULTATION DATE:  08/15/2016  REFERRING MD:  Early, vascular  CHIEF COMPLAINT:  Unable to extubate post surgery  HISTORY OF PRESENT ILLNESS:   56 year old [name] woman with uncontrolled diabetes, hypertension, peripheral arterial disease admitted 6/18 with left toe osteomyelitis, wound culture showed polymicrobial organisms. Course complicated by volume overload 6/23 that resulted in cancellation of surgery, improved with Lasix. Underwent left AKA by vascular surgery 6/25, uneventful procedure, felt to have increased rhonchi and hence extubation deferred by anesthesia and [facility] M asked to admit to ICU.  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  PAST SURGICAL HISTORY: She  has a past surgical history that includes abdominal aortagram (N/A, 05/20/2014); Amputation (Right, 05/23/2014); Cardiac catheterization (N/A, 03/11/2016); Cardiac catheterization (Left, 03/11/2016); and Amputation (Left, 08/09/2016).  Allergies  Allergen Reactions  . Bactrim [Sulfamethoxazole-Trimethoprim] Nausea And Vomiting and Other (See Comments)    Pt and family verified that pt takes this medication  . Tuberculin Ppd Other (See Comments)    Unknown  . Ace Inhibitors Cough    cough    No current facility-administered medications on file prior to encounter.    Current Outpatient Prescriptions on File Prior to Encounter  Medication Sig  . acetaminophen (TYLENOL) 650 MG CR tablet Take 650 mg by mouth every 8 (eight) hours as needed for pain.  Marland Kitchen amLODipine-atorvastatin (CADUET) 10-40 MG tablet Take 1 tablet by mouth daily.  Marland Kitchen aspirin 81 MG tablet Take 1 tablet (81 mg total) by mouth daily.  . citalopram (CELEXA) 20 MG tablet Take 20 mg by mouth daily.   . clopidogrel (PLAVIX) 75 MG tablet Take 1 tablet (75 mg total) by mouth daily with breakfast.  . glipiZIDE (GLUCOTROL XL) 5 MG 24 hr tablet Take 7.5 mg by mouth daily with breakfast.   . hydrochlorothiazide (HYDRODIURIL) 25 MG tablet Take 1 tablet (25 mg total) by mouth daily.  . metFORMIN (GLUCOPHAGE-XR) 500 MG 24 hr tablet Take 1,000 mg by mouth daily with breakfast.    FAMILY HISTORY:  Diabetes in her maternal side   SOCIAL HISTORY: She  reports that she has never smoked. She has never used smokeless tobacco. She reports that she does not drink alcohol or use drugs.  REVIEW OF SYSTEMS:   Unable to obtain since orally intubated and sedated  SUBJECTIVE:    VITAL SIGNS: BP (!) 155/64   Pulse 78   Temp 97.7 F (36.5 C)   Resp 19   Ht 5\' 2"  (1.575 m)   Wt 143 lb 8.3 oz (65.1 kg)   SpO2 95%   BMI 26.25 kg/m   HEMODYNAMICS:    VENTILATOR SETTINGS: Vent Mode: PRVC FiO2 (%):  [50 %] 50 % Set Rate:  [14 bmp] 14 bmp Vt Set:  [400 mL] 400 mL PEEP:  [5 cmH20] 5 cmH20 Plateau Pressure:  [22 cmH20] 22 cmH20  INTAKE / OUTPUT: I/O last 3 completed shifts: In: 2250 [IV Piggyback:2250] Out: 49 [Urine:2600]  PHYSICAL EXAMINATION: General:   appears older than stated age, orally intubated, in no respiratory distress Neuro:  Noted to be nonfocal prior to surgery, pupils 3 mm bilaterally equally reactive to light HEENT:  No pallor, icterus, JVD Cardiovascular:  S1-S2 normal Lungs:  Decreased breath sounds bilateral, faint scattered rhonchi Abdomen:  Soft and nontender Musculoskeletal:  Right BKA, left AKA with bandage Skin:  No rash  LABS:  BMET  Recent Labs Lab 08/13/16 0348 08/14/16 0340 08/15/16 0526  NA 137 138 138  K 3.0* 3.4* 3.1*  CL 103 101 101  CO2 25 28 25   BUN 13 9 7   CREATININE 0.84 0.81 0.73  GLUCOSE 143* 318* 232*    Electrolytes  Recent Labs Lab 08/10/16 1400  08/12/16 0556 08/13/16 0348 08/14/16 0340 08/15/16 0526  CALCIUM  --   < >  8.1* 8.2* 8.1* 8.1*  MG  --   --  1.7  --  1.2* 1.8  PHOS 2.6  --   --   --   --   --   < > = values in this interval not displayed.  CBC  Recent Labs Lab 08/10/16 0533 08/13/16 0348 08/15/16 0526  WBC 26.9* 25.0* 21.8*  HGB 7.9* 8.3* 8.5*  HCT 24.5* 25.9* 25.9*  PLT 416* 464* 445*    Coag's  Recent Labs Lab 08/09/16 0339 08/13/16 0348  INR 1.26 1.18    Sepsis Markers  Recent Labs Lab 08/08/16 1705  LATICACIDVEN 1.52    ABG No results for input(s): PHART, PCO2ART, PO2ART in the last 168 hours.  Liver Enzymes  Recent Labs Lab 08/09/16 0339  AST 138*  ALT 55*  ALKPHOS 157*  BILITOT 0.8  ALBUMIN 1.5*    Cardiac Enzymes No results for input(s): TROPONINI, PROBNP in the last 168 hours.  Glucose  Recent Labs Lab 08/14/16 1212 08/14/16 1644 08/14/16 2203 08/15/16 0104 08/15/16 0625 08/15/16 1431  GLUCAP 293* 271* 262* 281* 227* 190*    Imaging Dg Chest Port 1 View  Result Date: 08/15/2016 CLINICAL DATA:  Shortness of Breath EXAM: PORTABLE CHEST 1 VIEW COMPARISON:  08/14/2016 FINDINGS: Endotracheal tube is 3 cm above the carina. There is cardiomegaly. Vascular congestion and bilateral opacities are again noted compatible with mild edema/ CHF. Small bilateral effusions. IMPRESSION: Continued mild CHF with small effusions.  No real change. Endotracheal tube 3 cm above the carina. Electronically Signed   By: Rolm Baptise M.D.   On: 08/15/2016 15:01     STUDIES:    CULTURES: Wound 6/19 >>Citrobacter>>   ANTIBIOTICS: Vancomycin 6/19 Ceftriaxone 6/19 Flagyl 6/19    LINES/TUBES: ETT 6/25   Discussion left intubated due to concern for volume overload Hope to extubate once diuresed     PULMONARY A: Postop respiratory failure P:   Ventilator orders given, settings were reviewed and adjusted   Cardiovascular A:  Acute pulmonary edema P:  Lasix 40 IV daily Check echo and BNP and troponin for completion    RENAL A:    Hypokalemia/hypomagnesemia P:   Monitor and replete lites  GI A:   No issues P:   Protonix    INFECTIOUS A:   Left toe osteomyelitis P:   Status post BKA, can DC antibiotics   A:   Uncontrolled diabetes type 2    P:   Continue Lantus and use SSI-resistance scale    Neurologic  A:   Pain P:   RASS goal: 0 use fentanyl when necessary and Versed as needed    FAMILY  - Updates: None at bedside  - Inter-disciplinary family meet or Palliative Care meeting due by:  NA    My cc time x 53m  Kara Mead MD. [facility]. [place] [facility] Pager [phone] If no response call [phone]    08/15/2016, 4:51 PM

## 2016-08-15 NOTE — Progress Notes (Signed)
   Subjective:  Denies difficulty breathing today. She says that she is having pain in her foot, epigastric abdominal pain, and continues to have chest wall pain. She had not yet received her scheduled norco dose when seen during rounds. She and her husband are prepared for the surgery today.   Objective:  Vital signs in last 24 hours: Vitals:   08/14/16 1444 08/14/16 2158 08/15/16 0500 08/15/16 0623  BP: (!) 149/63 (!) 163/49  (!) 168/65  Pulse: 74 83  83  Resp: 19 20  18   Temp: 98.8 F (37.1 C) 98.9 F (37.2 C)  100 F (37.8 C)  TempSrc: Oral Oral  Oral  SpO2: 100% 100%  100%  Weight:   143 lb 8.3 oz (65.1 kg)    General Apperance: NAD HEENT: Normocephalic, atraumatic, anicteric sclera Neck: Supple, trachea midline Lungs: On nasal canula Clear to auscultation bilaterally. No wheezes or rales. 1+ left lower extremity pitting edema, Chest wall tenderness  Heart: Regular rate and rhythm, no murmur/rub/gallop Abdomen: Soft, epigastric tenderess, nondistended, no rebound/guarding Extremities: Warm and well perfused, R BKA, LLE in dressing Skin: No rashes Neurologic: Alert and interactive. No gross deficits.   Assessment/Plan: 56 year old [name] speaking woman with HTN, HLD, PAD, uncontrolled DM presenting with left toe osteomyelitis and systemic symptoms of fever, weakness, poor appetite, and dizziness admitted for IV antibiotics and surgical management.   Left Toe Osteomyelitis: Scheduled for AKA today. Pain seems to be controlled with scheduled medications.  - surgical culture >> polymicrobial, citrobacter freudii  -- Cont vanc, ceftriaxone and flagyl 6/19 >> this can be discontinued when we have source control later today  -- Norco 5-325 1-2 tabs q6hours scheduled   -- IV Dilaudid 1 mg q4 hours prn  -- Senakot-s, scheduled   Volume overload  Satting at 100% on room air and lungs are clear to auscultation today.  - continue to monitor   Epigastric abdominal pain  -  ordered IV protonix 40 mg daily  -zofran PRN   Hypokalemia Hypomagnesemia  K 3.1 today. Mag repleated yesterday.   - ordered K - dur 40 meq x2  -Continue daily BMP -f/u mag today   HTN:  Remains Hypertensive on home med amlodipine today.  -Continue home medication amlodipine 10 mg daily -restart HCTZ 25 mg daily today   DM, Uncontrolled Last A1c 11.4, home meds include metformin 1000 mg qd and glipizide 7.5 mg qAM. Her blood glucose trended 200-280 over past 24 hours  -  resistant sliding scale  - novolog 6 units with meals (to be held until she begins eating again after surgery)  - increase Lantus to 20 units daily ( previously 16 units )   - NPO for surgery later today, will resume carb modified diet after procedure   Hyperlipidemia  - continue atorvastatin 40 mg qd   PVD: S/p right BKA and left SFA stenting  --ASA and plavix are held now, plan is to resume 24 hours after surgery pending vvs recommendation   Depression: -- Continue home Celexa 20 mg   Low albumin Nutrition was consulted and ordered Multivitamins and boost ordered   Dispo: Anticipated discharge in approximately 2-4 day(s).   Ledell Noss, MD  08/15/2016, 10:26 AM Pager: [phone]

## 2016-08-15 NOTE — Progress Notes (Signed)
Dr. Donnetta Hutching spoke with patient and and husband using [employer]. Procedure explained by Dr. Donnetta Hutching pt. Is agreeable to having surgery.Interperter ID  [id number] ([place] name of interpreter)

## 2016-08-15 NOTE — Anesthesia Postprocedure Evaluation (Signed)
Anesthesia Post Note  Patient: Kimberly Lane  Procedure(s) Performed: Procedure(s) (LRB): AMPUTATION ABOVE KNEE (Left)     Patient location during evaluation: SICU Anesthesia Type: General Level of consciousness: sedated Pain management: pain level controlled Vital Signs Assessment: post-procedure vital signs reviewed and stable Respiratory status: patient remains intubated per anesthesia plan Cardiovascular status: stable : bronchospasm and respiratory insufficency. Anesthetic complications: no    Last Vitals:  Vitals:   08/15/16 1645 08/15/16 1652  BP: (!) 155/64   Pulse: 78 78  Resp: 19 (!) 23  Temp: 36.5 C     Last Pain:  Vitals:   08/15/16 1645  TempSrc:   PainSc: Asleep                 Amen Dargis,JAMES TERRILL

## 2016-08-15 NOTE — Progress Notes (Signed)
Pharmacy Antibiotic Note  Kimberly Lane is a 56 y.o. female admitted on 08/08/2016 with increased left toe pain, elevated WBC, and fever.  Patient has chronic left toe gangrene/osteo being medically managed. Pharmacy has been consulted for vancomycin dosing.  Patient's renal function has been stable.  She has a low-grade fever and her WBC is improving slowly.  Vancomycin trough is therapeutic.   Plan: - Continue vanc 750mg  IV Q12H - CTX 2gm IV Q24H and Flagyl 500mg  IV Q8H per MD - Monitor renal fxn, clinical progress, abx LOT post amputation - Consider increasing Lantus and supplementing KCL  - F/U with resuming home meds    Weight: 143 lb 8.3 oz (65.1 kg)  Temp (24hrs), Avg:99.2 F (37.3 C), Min:98.8 F (37.1 C), Max:100 F (37.8 C)   Recent Labs Lab 08/08/16 1650 08/08/16 1705 08/09/16 0339 08/10/16 0533 08/11/16 0631 08/12/16 0556 08/12/16 1115 08/13/16 0348 08/14/16 0340 08/15/16 0526  WBC 24.9*  --  23.5* 26.9*  --   --   --  25.0*  --  21.8*  CREATININE 0.91  --  0.86 0.97 0.93 0.79  --  0.84 0.81 0.73  LATICACIDVEN  --  1.52  --   --   --   --   --   --   --   --   VANCOTROUGH  --   --   --   --   --   --  49*  --   --   --     Estimated Creatinine Clearance: 69.3 mL/min (by C-G formula based on SCr of 0.73 mg/dL).    Allergies  Allergen Reactions  . Bactrim [Sulfamethoxazole-Trimethoprim] Nausea And Vomiting and Other (See Comments)    Pt and family verified that pt takes this medication  . Tuberculin Ppd Other (See Comments)    Unknown  . Ace Inhibitors Cough    cough     Vanc 6/18 >> CTX 6/19 >>  Flagyl 6/19 >>  6/22 VT = 51 mcg/mL (drawn after vanc was hung)  6/25 VT = 17 mcg/mL on 750mg  q12 >> no change  6/18 BCx - negative 6/19 left toe wound - Citrobacter freundii (R-cefazolin)    Gerrit Rafalski D. Mina Marble, PharmD, BCPS Pager:  [phone] 08/15/2016, 10:20 AM

## 2016-08-15 NOTE — Anesthesia Preprocedure Evaluation (Addendum)
Anesthesia Evaluation  Patient identified by MRN, date of birth, ID band Patient awake    Reviewed: Allergy & Precautions, NPO status , Patient's Chart, lab work & pertinent test results  History of Anesthesia Complications Negative for: history of anesthetic complications  Airway Mallampati: II       Dental  (+) Missing, Poor Dentition, Dental Advisory Given   Pulmonary shortness of breath,    + rhonchi  + decreased breath sounds      Cardiovascular hypertension, + Peripheral Vascular Disease   Rhythm:Regular Rate:Tachycardia     Neuro/Psych    GI/Hepatic   Endo/Other  diabetes, Poorly Controlled  Renal/GU      Musculoskeletal  (+) Arthritis ,   Abdominal (+) + obese,   Peds  Hematology  (+) anemia ,   Anesthesia Other Findings   Reproductive/Obstetrics                            Anesthesia Physical Anesthesia Plan  ASA: IV  Anesthesia Plan: General   Post-op Pain Management:    Induction: Intravenous  PONV Risk Score and Plan: 2 and Ondansetron and Dexamethasone  Airway Management Planned: Oral ETT  Additional Equipment:   Intra-op Plan:   Post-operative Plan: Extubation in OR  Informed Consent: I have reviewed the patients History and Physical, chart, labs and discussed the procedure including the risks, benefits and alternatives for the proposed anesthesia with the patient or authorized representative who has indicated his/her understanding and acceptance.     Plan Discussed with: CRNA  Anesthesia Plan Comments:        Anesthesia Quick Evaluation

## 2016-08-15 NOTE — Progress Notes (Signed)
Dr. Orene Desanctis updated, patient lung sounds better, not as junky as it was after breathing treatment given by Raquel Sarna RT, also updated that Dr.Early/Sam PA put in and ICU order for patient. Order received to give Versed and Fentanyl for sedation.

## 2016-08-15 NOTE — Interval H&P Note (Signed)
History and Physical Interval Note:  08/15/2016 11:28 AM  Kimberly Lane  has presented today for surgery, with the diagnosis of PAD  The various methods of treatment have been discussed with the patient and family. After consideration of risks, benefits and other options for treatment, the patient has consented to  Procedure(s): AMPUTATION ABOVE KNEE (Left) as a surgical intervention .  The patient's history has been reviewed, patient examined, no change in status, stable for surgery.  I have reviewed the patient's chart and labs.  Questions were answered to the patient's satisfaction.     Curt Jews

## 2016-08-15 NOTE — Transfer of Care (Signed)
Immediate Anesthesia Transfer of Care Note  Patient: Kimberly Lane  Procedure(s) Performed: Procedure(s): AMPUTATION ABOVE KNEE (Left)  Patient Location: PACU  Anesthesia Type:General  Level of Consciousness: sedated, drowsy and Patient remains intubated per anesthesia plan  Airway & Oxygen Therapy: Patient placed on Ventilator (see vital sign flow sheet for setting)  Post-op Assessment: Report given to RN and Post -op Vital signs reviewed and stable  Post vital signs: Reviewed and stable  Last Vitals:  Vitals:   08/15/16 1428 08/15/16 1434  BP: (!) 143/94 (!) 146/64  Pulse: 89 88  Resp: (!) 21 20  Temp: 36.7 C     Last Pain:  Vitals:   08/15/16 0623  TempSrc: Oral  PainSc:          Complications: No apparent anesthesia complications and respiratory complications

## 2016-08-15 NOTE — Plan of Care (Signed)
Problem: Safety: Goal: Ability to remain free from injury will improve Call bell and patient belongings are in reach. Bed alarm in on and working.

## 2016-08-15 NOTE — H&P (View-Only) (Signed)
  Progress Note    08/14/2016 10:25 AM 5 Days Post-Op  Subjective: improved breathing from yesterday, no complaints today  Vitals:   08/13/16 2050 08/14/16 0444  BP: (!) 144/62 (!) 150/66  Pulse: 78 76  Resp: 18 18  Temp: 98.6 F (37 C) 98.3 F (36.8 C)    Physical Exam: Awake and alert Left foot with more purulence drained, no erythema  CBC    Component Value Date/Time   WBC 25.0 (H) 08/13/2016 0348   RBC 3.34 (L) 08/13/2016 0348   HGB 8.3 (L) 08/13/2016 0348   HCT 25.9 (L) 08/13/2016 0348   PLT 464 (H) 08/13/2016 0348   MCV 77.5 (L) 08/13/2016 0348   MCH 24.9 (L) 08/13/2016 0348   MCHC 32.0 08/13/2016 0348   RDW 14.7 08/13/2016 0348   LYMPHSABS 2.4 08/10/2016 0533   MONOABS 1.9 (H) 08/10/2016 0533   EOSABS 0.0 08/10/2016 0533   BASOSABS 0.0 08/10/2016 0533    BMET    Component Value Date/Time   NA 138 08/14/2016 0340   K 3.4 (L) 08/14/2016 0340   CL 101 08/14/2016 0340   CO2 28 08/14/2016 0340   GLUCOSE 318 (H) 08/14/2016 0340   BUN 9 08/14/2016 0340   CREATININE 0.81 08/14/2016 0340   CREATININE 0.58 12/18/2013 1515   CALCIUM 8.1 (L) 08/14/2016 0340   GFRNONAA >60 08/14/2016 0340   GFRAA >60 08/14/2016 0340    INR    Component Value Date/Time   INR 1.18 08/13/2016 0348     Intake/Output Summary (Last 24 hours) at 08/14/16 1025 Last data filed at 08/14/16 0445  Gross per 24 hour  Intake              120 ml  Output             2500 ml  Net            -2380 ml     Assessment:  56 y.o. female is s/p left 1st toe amputation and will need left aka. Persistent leukocytosis with stable necrosis of much of left foot. repiratory status improved from yesterday now off O2.   Plan: OR tomorrow for left AKA.     Verdis Bassette C. Donzetta Matters, MD [facility] Office: [phone] Pager: [phone]  08/14/2016 10:25 AM

## 2016-08-15 NOTE — Progress Notes (Signed)
[place] interpreter services, [phone]. Pt ok to go to surgery and sign consent.

## 2016-08-16 ENCOUNTER — Inpatient Hospital Stay (HOSPITAL_COMMUNITY): Payer: No Typology Code available for payment source

## 2016-08-16 ENCOUNTER — Encounter (HOSPITAL_COMMUNITY): Payer: Self-pay | Admitting: Vascular Surgery

## 2016-08-16 LAB — GLUCOSE, CAPILLARY
GLUCOSE-CAPILLARY: 194 mg/dL — AB (ref 65–99)
GLUCOSE-CAPILLARY: 221 mg/dL — AB (ref 65–99)
Glucose-Capillary: 172 mg/dL — ABNORMAL HIGH (ref 65–99)
Glucose-Capillary: 194 mg/dL — ABNORMAL HIGH (ref 65–99)
Glucose-Capillary: 399 mg/dL — ABNORMAL HIGH (ref 65–99)

## 2016-08-16 LAB — MAGNESIUM: Magnesium: 1.6 mg/dL — ABNORMAL LOW (ref 1.7–2.4)

## 2016-08-16 LAB — AEROBIC/ANAEROBIC CULTURE W GRAM STAIN (SURGICAL/DEEP WOUND)

## 2016-08-16 LAB — CBC
HEMATOCRIT: 27 % — AB (ref 36.0–46.0)
Hemoglobin: 8.6 g/dL — ABNORMAL LOW (ref 12.0–15.0)
MCH: 24.4 pg — ABNORMAL LOW (ref 26.0–34.0)
MCHC: 31.9 g/dL (ref 30.0–36.0)
MCV: 76.7 fL — ABNORMAL LOW (ref 78.0–100.0)
Platelets: 507 10*3/uL — ABNORMAL HIGH (ref 150–400)
RBC: 3.52 MIL/uL — ABNORMAL LOW (ref 3.87–5.11)
RDW: 15.2 % (ref 11.5–15.5)
WBC: 25.3 10*3/uL — AB (ref 4.0–10.5)

## 2016-08-16 LAB — TROPONIN I
Troponin I: 0.08 ng/mL (ref <0.03)
Troponin I: 0.05 ng/mL (ref <0.03)
Troponin I: 0.03 ng/mL (ref <0.03)

## 2016-08-16 LAB — PHOSPHORUS: PHOSPHORUS: 3.3 mg/dL (ref 2.5–4.6)

## 2016-08-16 LAB — AEROBIC/ANAEROBIC CULTURE (SURGICAL/DEEP WOUND)

## 2016-08-16 MED ORDER — INSULIN ASPART 100 UNIT/ML ~~LOC~~ SOLN
0.0000 [IU] | Freq: Three times a day (TID) | SUBCUTANEOUS | Status: DC
  Administered 2016-08-17 (×2): 11 [IU] via SUBCUTANEOUS
  Administered 2016-08-18: 4 [IU] via SUBCUTANEOUS
  Administered 2016-08-19: 20 [IU] via SUBCUTANEOUS
  Administered 2016-08-19: 15 [IU] via SUBCUTANEOUS

## 2016-08-16 MED ORDER — INSULIN ASPART 100 UNIT/ML ~~LOC~~ SOLN
0.0000 [IU] | Freq: Every day | SUBCUTANEOUS | Status: DC
  Administered 2016-08-16: 5 [IU] via SUBCUTANEOUS

## 2016-08-16 MED ORDER — FUROSEMIDE 10 MG/ML IJ SOLN
INTRAMUSCULAR | Status: AC
  Administered 2016-08-16: 40 mg
  Filled 2016-08-16: qty 4

## 2016-08-16 NOTE — Progress Notes (Signed)
CBC and BMP results reported to CCM team, no further order.

## 2016-08-16 NOTE — Progress Notes (Signed)
  Progress Note    08/16/2016 11:46 AM 1 Day Post-Op  Subjective:  Intubated and sedated  Vitals:   08/16/16 0801 08/16/16 1135  BP: (!) 161/73 (!) 158/87  Pulse: 80   Resp: (!) 21   Temp: 98.1 F (36.7 C)     Physical Exam: Intubated Left aka site dressing cdi  CBC    Component Value Date/Time   WBC 25.3 (H) 08/16/2016 0352   RBC 3.52 (L) 08/16/2016 0352   HGB 8.6 (L) 08/16/2016 0352   HCT 27.0 (L) 08/16/2016 0352   PLT 507 (H) 08/16/2016 0352   MCV 76.7 (L) 08/16/2016 0352   MCH 24.4 (L) 08/16/2016 0352   MCHC 31.9 08/16/2016 0352   RDW 15.2 08/16/2016 0352   LYMPHSABS 2.4 08/10/2016 0533   MONOABS 1.9 (H) 08/10/2016 0533   EOSABS 0.0 08/10/2016 0533   BASOSABS 0.0 08/10/2016 0533    BMET    Component Value Date/Time   NA 137 08/15/2016 2009   K 3.6 08/15/2016 2009   CL 100 (L) 08/15/2016 2009   CO2 24 08/15/2016 2009   GLUCOSE 402 (H) 08/15/2016 2009   BUN 9 08/15/2016 2009   CREATININE 0.98 08/15/2016 2009   CREATININE 0.58 12/18/2013 1515   CALCIUM 8.1 (L) 08/15/2016 2009   GFRNONAA >60 08/15/2016 2009   GFRAA >60 08/15/2016 2009    INR    Component Value Date/Time   INR 1.18 08/13/2016 0348     Intake/Output Summary (Last 24 hours) at 08/16/16 1146 Last data filed at 08/16/16 0800  Gross per 24 hour  Intake              810 ml  Output             1650 ml  Net             -840 ml     Assessment/Plan: Pod#1 left aka Dressing down tomorrow.   Rovena Hearld C. Donzetta Matters, MD [facility] Office: [phone] Pager: [phone]  08/16/2016 11:46 AM

## 2016-08-16 NOTE — Progress Notes (Signed)
Internal Medicine Attending:   I saw and examined the patient. I reviewed the resident's note and I agree with the resident's findings and plan as documented in the resident's note. Kimberly Lane has had a difficult hospital course after presenting with dry gangrene of left toe.  Initial ambulation required subsequent AKA which happened yesterday.  She did have some pulmonary edema which we attributed to her treatment with multiple IV antibiotics as well as IVF periop.  Yesterday after the AKA she was diffcult to extubate, repeat CXR again revealed some signs of pulmonary edema. BNP is also elevated.  An echo is pending.  This morning on AM rounds we saw her during her weaning trial.  She was alert and uncomfortable due to her leg pain and ETT discomfort, lungs grossly clear to auscultation on anterior lung fields.

## 2016-08-16 NOTE — Consult Note (Signed)
PULMONARY / CRITICAL CARE MEDICINE   Name: Kimberly Lane MRN: [id number] DOB: 1960/06/29    ADMISSION DATE:  08/08/2016 CONSULTATION DATE:  08/16/2016  REFERRING MD:  Early, vascular  CHIEF COMPLAINT:  Unable to extubate post surgery  HISTORY OF PRESENT ILLNESS:   56 year old [name] woman with uncontrolled diabetes, hypertension, peripheral arterial disease admitted 6/18 with left toe osteomyelitis, wound culture showed polymicrobial organisms. Course complicated by volume overload 6/23 that resulted in cancellation of surgery, improved with Lasix. Underwent left AKA by vascular surgery 6/25, uneventful procedure, felt to have increased rhonchi and hence extubation deferred by anesthesia and PCCM asked to admit to ICU.  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  PAST SURGICAL HISTORY: She  has a past surgical history that includes abdominal aortagram (N/A, 05/20/2014); Amputation (Right, 05/23/2014); Cardiac catheterization (N/A, 03/11/2016); Cardiac catheterization (Left, 03/11/2016); Amputation (Left, 08/09/2016); and Amputation (Left, 08/15/2016).  Allergies  Allergen Reactions  . Bactrim [Sulfamethoxazole-Trimethoprim] Nausea And Vomiting and Other (See Comments)    Pt and family verified that pt takes this medication  . Tuberculin Ppd Other (See Comments)    Unknown  . Ace Inhibitors Cough    cough    No current facility-administered medications on file prior to encounter.    Current Outpatient Prescriptions on File Prior to Encounter  Medication Sig  . acetaminophen (TYLENOL) 650 MG CR tablet Take 650 mg by mouth every 8 (eight) hours as needed for pain.  Marland Kitchen amLODipine-atorvastatin (CADUET) 10-40 MG tablet Take 1 tablet by mouth daily.  Marland Kitchen aspirin 81 MG tablet Take 1 tablet (81 mg total) by mouth daily.  . citalopram (CELEXA) 20 MG  tablet Take 20 mg by mouth daily.  . clopidogrel (PLAVIX) 75 MG tablet Take 1 tablet (75 mg total) by mouth daily with breakfast.  . glipiZIDE (GLUCOTROL XL) 5 MG 24 hr tablet Take 7.5 mg by mouth daily with breakfast.   . hydrochlorothiazide (HYDRODIURIL) 25 MG tablet Take 1 tablet (25 mg total) by mouth daily.  . metFORMIN (GLUCOPHAGE-XR) 500 MG 24 hr tablet Take 1,000 mg by mouth daily with breakfast.    FAMILY HISTORY:  Diabetes in her maternal side   SOCIAL HISTORY: She  reports that she has never smoked. She has never used smokeless tobacco. She reports that she does not drink alcohol or use drugs.  REVIEW OF SYSTEMS:   Unable to obtain since orally intubated and sedated  SUBJECTIVE:    VITAL SIGNS: BP (!) 161/73 (BP Location: Right Arm)   Pulse 80   Temp 98.1 F (36.7 C) (Oral)   Resp (!) 21   Ht 5\' 2"  (1.575 m)   Wt 133 lb 13.1 oz (60.7 kg)   SpO2 99%   BMI 24.48 kg/m   HEMODYNAMICS:    VENTILATOR SETTINGS: Vent Mode: CPAP;PSV FiO2 (%):  [40 %-50 %] 40 % Set Rate:  [14 bmp] 14 bmp Vt Set:  [400 mL] 400 mL PEEP:  [5 cmH20] 5 cmH20 Pressure Support:  [5 cmH20-10 cmH20] 5 cmH20 Plateau Pressure:  [19 cmH20-22 cmH20] 19 cmH20  INTAKE / OUTPUT: I/O last 3 completed shifts: In: 750 [I.V.:300; IV Piggyback:450] Out: 2850 [Urine:2750; Blood:100]  PHYSICAL EXAMINATION: Blood pressure (!) 161/73, pulse 80, temperature 98.1 F (36.7 C), temperature source Oral, resp. rate (!) 21, height 5\' 2"  (1.575 m), weight 133 lb 13.1 oz (60.7 kg), SpO2  99 %. Gen:      No acute distress HEENT:  EOMI, sclera anicteric, OETT in place Neck:     No masses; no thyromegaly Lungs:    Clear to auscultation bilaterally; normal respiratory effort CV:         S1, S2; no murmurs Abd:      + bowel sounds; soft, non-tender; no palpable masses, no distension Ext:    Rt BKA, Lt AKA in bandage; adequate peripheral perfusion Skin:      Warm and dry; no rash Neuro: Awake, no focal  deficits.  LABS:  BMET  Recent Labs Lab 08/14/16 0340 08/15/16 0526 08/15/16 2009  NA 138 138 137  K 3.4* 3.1* 3.6  CL 101 101 100*  CO2 28 25 24   BUN 9 7 9   CREATININE 0.81 0.73 0.98  GLUCOSE 318* 232* 402*    Electrolytes  Recent Labs Lab 08/10/16 1400  08/14/16 0340 08/15/16 0526 08/15/16 2009 08/16/16 0352  CALCIUM  --   < > 8.1* 8.1* 8.1*  --   MG  --   < > 1.2* 1.8  --  1.6*  PHOS 2.6  --   --   --   --  3.3  < > = values in this interval not displayed.  CBC  Recent Labs Lab 08/13/16 0348 08/15/16 0526 08/16/16 0352  WBC 25.0* 21.8* 25.3*  HGB 8.3* 8.5* 8.6*  HCT 25.9* 25.9* 27.0*  PLT 464* 445* 507*    Coag's  Recent Labs Lab 08/13/16 0348  INR 1.18    Sepsis Markers No results for input(s): LATICACIDVEN, PROCALCITON, O2SATVEN in the last 168 hours.  ABG  Recent Labs Lab 08/15/16 1727  PHART 7.469*  PCO2ART 34.6  PO2ART 78.0*    Liver Enzymes No results for input(s): AST, ALT, ALKPHOS, BILITOT, ALBUMIN in the last 168 hours.  Cardiac Enzymes  Recent Labs Lab 08/15/16 2009  TROPONINI 0.08*    Glucose  Recent Labs Lab 08/15/16 0625 08/15/16 1431 08/15/16 2025 08/15/16 2340 08/16/16 0416 08/16/16 0759  GLUCAP 227* 190* 410* 326* 194* 172*    Imaging Portable Chest Xray  Result Date: 08/16/2016 CLINICAL DATA:  56 year old female with respiratory failure. Subsequent encounter. EXAM: PORTABLE CHEST 1 VIEW COMPARISON:  08/15/2016. FINDINGS: Endotracheal tube tip 3.5 cm above the carina. Nasogastric tube courses below the diaphragm. Tip is not included on the present exam. Pulmonary edema similar to prior exam. Retrocardiac opacity may represent atelectasis or infiltrate and without change. Cardiomegaly. Prominent aortic knob. Right peritracheal prominent stable. Acromioclavicular joint degenerative changes. IMPRESSION: Similar appearance of pulmonary edema. Retrocardiac consolidation unchanged and may represent atelectasis  or infiltrate. Cardiomegaly. Electronically Signed   By: Genia Del M.D.   On: 08/16/2016 07:26   Dg Chest Port 1 View  Result Date: 08/15/2016 CLINICAL DATA:  Shortness of Breath EXAM: PORTABLE CHEST 1 VIEW COMPARISON:  08/14/2016 FINDINGS: Endotracheal tube is 3 cm above the carina. There is cardiomegaly. Vascular congestion and bilateral opacities are again noted compatible with mild edema/ CHF. Small bilateral effusions. IMPRESSION: Continued mild CHF with small effusions.  No real change. Endotracheal tube 3 cm above the carina. Electronically Signed   By: Rolm Baptise M.D.   On: 08/15/2016 15:01    STUDIES:    CULTURES: Wound 6/19 >>Citrobacter>>   ANTIBIOTICS: Vancomycin 6/19 Ceftriaxone 6/19 Flagyl 6/19  LINES/TUBES: ETT 6/25  Discussion Lt AKA, left intubated due to concern for volume overload Hope to extubate once diuresed  PULMONARY A: Postop respiratory  failure P:   Continue vent support PSV weans as tolerated Will need more diuresis before extubation.   Cardiovascular A:  Acute pulmonary edema HTN P:  Continue lasix. Increase dose to 40 mg bid Follow echo, troponin Norvasc Labetalol and lopresor PRN  RENAL A:   Hypokalemia/hypomagnesemia P:   Replete lytes  GI A:   No issues P:   PPI Start tube feeds if still extubated tomorrow  INFECTIOUS A:   Left toe osteomyelitis Citrobacter bacteremia P:   Status post BKA Continue abx. Awaiting final cx from wound.   A:   Uncontrolled diabetes type 2    P:   Continue lantus SSI resistance scale  Neurologic  A:   Pain P:   RASS goal: 0 PRN fentanyl, versed  FAMILY  - Updates: Husband at bedside - Inter-disciplinary family meet or Palliative Care meeting due by:  NA   The patient is critically ill with multiple organ system failure and requires high complexity decision making for assessment and support, frequent evaluation and titration of therapies, advanced monitoring, review of  radiographic studies and interpretation of complex data.   Critical Care Time devoted to patient care services, exclusive of separately billable procedures, described in this note is 35 minutes.   Marshell Garfinkel MD [facility] Pager [phone] If no answer or after 3pm call: [phone] 08/16/2016, 10:39 AM

## 2016-08-16 NOTE — Procedures (Signed)
Extubation Procedure Note  Patient Details:   Name: Kimberly Lane DOB: [birth year] MRN: [id number]   Airway Documentation:     Evaluation  O2 sats: stable throughout Complications: No apparent complications Patient did tolerate procedure well. Bilateral Breath Sounds: Clear, Diminished   Yes   Pt extubated to 2L N/C.  No stridor noted.  RN at bedside.   Donnetta Hail 08/16/2016, 12:40 PM

## 2016-08-16 NOTE — Progress Notes (Signed)
Blood glucose level of 404 and troponin 0.08 reported to CCM team, no further order.

## 2016-08-16 NOTE — Progress Notes (Signed)
PT Cancellation Note  Patient Details Name: Kimberly Lane MRN: [id number] DOB: Feb 16, 1961   Cancelled Treatment:    Reason Eval/Treat Not Completed: Patient not medically ready; pt intubated and with restraints.  RN reports plan today for pain control and extubation.  Will attempt another day.   [name] 08/16/2016, 11:31 AM  [name], [place] 08/16/2016

## 2016-08-16 NOTE — Consult Note (Signed)
Physical Medicine and Rehabilitation Consult Reason for Consult: Decreased functional mobility Referring Physician: [facility]   HPI: Kimberly Lane is a 56 y.o. right handed limited English-speaking female from [place] with history of hypertension, diabetes mellitus, anxiety and right BKA April 2016 and received inpatient rehabilitation services. Per chart review patient lives with spouse. Spouse works night shift. Patient primarily uses a wheelchair for mobility in the home. Presented 08/14/2016 with gangrenous left foot and history of left great toe amputation 08/09/2016. Poor healing with ongoing ischemic changes. Limb was not felt to be salvageable. Underwent left AKA 08/15/2016 per Dr. Donnetta Hutching. Hospital course pain management. Acute on chronic anemia 7.9 and monitor. Leukocytosis 17,900. Subcutaneous Lovenox for DVT prophylaxis. Wound culture Citrobacter currently on broad-spectrum antibiotics. Slow to extubate with follow-up per pulmonary services and extubated 08/17/2016. Physical therapy evaluation completed 08/17/2016 with recommendations of physical medicine rehabilitation consult.  Patient awake and alert in bed. She points to her left leg when I asked about pain  Review of Systems  Unable to perform ROS: Language   Past Medical History:  Diagnosis Date  . Allergy   . Anemia   . Anxiety   . Cellulitis and abscess of foot 05/13/2014   rt foot  . Diabetes mellitus   . Hypertension   . Positive TB test 2013   Past Surgical History:  Procedure Laterality Date  . ABDOMINAL AORTAGRAM N/A 05/20/2014   Procedure: ABDOMINAL Maxcine Ham;  Surgeon: Serafina Mitchell, MD;  Location: [facility] CATH LAB;  Service: Cardiovascular;  Laterality: N/A;  . AMPUTATION Right 05/23/2014   Procedure: AMPUTATION BELOW KNEE;  Surgeon: Mal Misty, MD;  Location: [place];  Service: Vascular;  Laterality: Right;  . AMPUTATION Left 08/09/2016   Procedure: LEFT GREAT TOE AMPUTATION;  Surgeon: Waynetta Sandy,  MD;  Location: [place];  Service: Vascular;  Laterality: Left;  . AMPUTATION Left 08/15/2016   Procedure: AMPUTATION ABOVE KNEE;  Surgeon: Rosetta Posner, MD;  Location: [place];  Service: Vascular;  Laterality: Left;  . PERIPHERAL VASCULAR CATHETERIZATION N/A 03/11/2016   Procedure: Abdominal Aortogram w/Lower Extremity;  Surgeon: Elam Dutch, MD;  Location: [place] CV LAB;  Service: Cardiovascular;  Laterality: N/A;  . PERIPHERAL VASCULAR CATHETERIZATION Left 03/11/2016   Procedure: Peripheral Vascular Intervention;  Surgeon: Elam Dutch, MD;  Location: [place] CV LAB;  Service: Cardiovascular;  Laterality: Left;  Superficial femoral   Family History  Problem Relation Age of Onset  . Heart disease Unknown        No family history   Social History:  reports that she has never smoked. She has never used smokeless tobacco. She reports that she does not drink alcohol or use drugs. Allergies:  Allergies  Allergen Reactions  . Bactrim [Sulfamethoxazole-Trimethoprim] Nausea And Vomiting and Other (See Comments)    Pt and family verified that pt takes this medication  . Tuberculin Ppd Other (See Comments)    Unknown  . Ace Inhibitors Cough    cough   Medications Prior to Admission  Medication Sig Dispense Refill  . acetaminophen (TYLENOL) 650 MG CR tablet Take 650 mg by mouth every 8 (eight) hours as needed for pain.    Marland Kitchen amLODipine-atorvastatin (CADUET) 10-40 MG tablet Take 1 tablet by mouth daily.    Marland Kitchen aspirin 81 MG tablet Take 1 tablet (81 mg total) by mouth daily. 100 tablet 0  . citalopram (CELEXA) 20 MG tablet Take 20 mg by mouth daily.    Marland Kitchen  clopidogrel (PLAVIX) 75 MG tablet Take 1 tablet (75 mg total) by mouth daily with breakfast. 30 tablet 11  . glipiZIDE (GLUCOTROL XL) 5 MG 24 hr tablet Take 7.5 mg by mouth daily with breakfast.     . hydrochlorothiazide (HYDRODIURIL) 25 MG tablet Take 1 tablet (25 mg total) by mouth daily. 90 tablet 3  . losartan (COZAAR) 50 MG tablet  Take 50 mg by mouth daily.    . metFORMIN (GLUCOPHAGE-XR) 500 MG 24 hr tablet Take 1,000 mg by mouth daily with breakfast.      Home:    Functional History:   Functional Status:  Mobility:          ADL:    Cognition: Cognition Orientation Level: Oriented to person, Oriented to time    Blood pressure (!) 152/69, pulse 74, temperature 98.3 F (36.8 C), temperature source Oral, resp. rate 16, height 5\' 2"  (1.575 m), weight 60.7 kg (133 lb 13.1 oz), SpO2 98 %. Physical Exam  HENT:  Head: Normocephalic.  Eyes: EOM are normal.  Neck: Normal range of motion. Neck supple. No thyromegaly present.  Cardiovascular: Normal rate, regular rhythm and normal heart sounds.   Respiratory: Effort normal and breath sounds normal. No respiratory distress.  GI: Soft. Bowel sounds are normal. She exhibits no distension.  Neurological: She is alert.  Limited English-speaking. Follows simple commands   Skin. Right BKA well healed. Left AKA is dressed appropriately tender  Results for orders placed or performed during the hospital encounter of 08/08/16 (from the past 24 hour(s))  Vancomycin, trough     Status: None   Collection Time: 08/15/16  9:15 AM  Result Value Ref Range   Vancomycin Tr 17 15 - 20 ug/mL  Glucose, capillary     Status: Abnormal   Collection Time: 08/15/16  2:31 PM  Result Value Ref Range   Glucose-Capillary 190 (H) 65 - 99 mg/dL  I-STAT 3, arterial blood gas (G3+)     Status: Abnormal   Collection Time: 08/15/16  5:27 PM  Result Value Ref Range   pH, Arterial 7.469 (H) 7.350 - 7.450   pCO2 arterial 34.6 32.0 - 48.0 mmHg   pO2, Arterial 78.0 (L) 83.0 - 108.0 mmHg   Bicarbonate 25.3 20.0 - 28.0 mmol/L   TCO2 26 0 - 100 mmol/L   O2 Saturation 96.0 %   Acid-Base Excess 2.0 0.0 - 2.0 mmol/L   Patient temperature 97.7 F    Collection site RADIAL, ALLEN'S TEST ACCEPTABLE    Drawn by Operator    Sample type ARTERIAL   Basic metabolic panel     Status: Abnormal    Collection Time: 08/15/16  8:09 PM  Result Value Ref Range   Sodium 137 135 - 145 mmol/L   Potassium 3.6 3.5 - 5.1 mmol/L   Chloride 100 (L) 101 - 111 mmol/L   CO2 24 22 - 32 mmol/L   Glucose, Bld 402 (H) 65 - 99 mg/dL   BUN 9 6 - 20 mg/dL   Creatinine, Ser 0.98 0.44 - 1.00 mg/dL   Calcium 8.1 (L) 8.9 - 10.3 mg/dL   GFR calc non Af Amer >60 >60 mL/min   GFR calc Af Amer >60 >60 mL/min   Anion gap 13 5 - 15  Troponin I     Status: Abnormal   Collection Time: 08/15/16  8:09 PM  Result Value Ref Range   Troponin I 0.08 (HH) <0.03 ng/mL  Brain natriuretic peptide     Status: Abnormal  Collection Time: 08/15/16  8:09 PM  Result Value Ref Range   B Natriuretic Peptide 406.1 (H) 0.0 - 100.0 pg/mL  Glucose, capillary     Status: Abnormal   Collection Time: 08/15/16  8:25 PM  Result Value Ref Range   Glucose-Capillary 410 (H) 65 - 99 mg/dL   Comment 1 Capillary Specimen   Glucose, capillary     Status: Abnormal   Collection Time: 08/15/16 11:40 PM  Result Value Ref Range   Glucose-Capillary 326 (H) 65 - 99 mg/dL  CBC     Status: Abnormal   Collection Time: 08/16/16  3:52 AM  Result Value Ref Range   WBC 25.3 (H) 4.0 - 10.5 K/uL   RBC 3.52 (L) 3.87 - 5.11 MIL/uL   Hemoglobin 8.6 (L) 12.0 - 15.0 g/dL   HCT 27.0 (L) 36.0 - 46.0 %   MCV 76.7 (L) 78.0 - 100.0 fL   MCH 24.4 (L) 26.0 - 34.0 pg   MCHC 31.9 30.0 - 36.0 g/dL   RDW 15.2 11.5 - 15.5 %   Platelets 507 (H) 150 - 400 K/uL  Magnesium     Status: Abnormal   Collection Time: 08/16/16  3:52 AM  Result Value Ref Range   Magnesium 1.6 (L) 1.7 - 2.4 mg/dL  Phosphorus     Status: None   Collection Time: 08/16/16  3:52 AM  Result Value Ref Range   Phosphorus 3.3 2.5 - 4.6 mg/dL  Glucose, capillary     Status: Abnormal   Collection Time: 08/16/16  4:16 AM  Result Value Ref Range   Glucose-Capillary 194 (H) 65 - 99 mg/dL   Comment 1 Capillary Specimen    Dg Chest Port 1 View  Result Date: 08/15/2016 CLINICAL DATA:  Shortness  of Breath EXAM: PORTABLE CHEST 1 VIEW COMPARISON:  08/14/2016 FINDINGS: Endotracheal tube is 3 cm above the carina. There is cardiomegaly. Vascular congestion and bilateral opacities are again noted compatible with mild edema/ CHF. Small bilateral effusions. IMPRESSION: Continued mild CHF with small effusions.  No real change. Endotracheal tube 3 cm above the carina. Electronically Signed   By: Rolm Baptise M.D.   On: 08/15/2016 15:01   Dg Chest Port 1 View  Result Date: 08/14/2016 CLINICAL DATA:  Dyspnea. EXAM: PORTABLE CHEST 1 VIEW COMPARISON:  08/13/2016 FINDINGS: The cardiac silhouette remains mildly enlarged. The patient has taken a slightly greater inspiration than on the prior study, and there is slightly improved aeration of the lung bases. Pulmonary vascular congestion and patchy perihilar and basilar airspace opacities have slightly improved. Small pleural effusions are likely present bilaterally. No pneumothorax is identified. IMPRESSION: Slightly improved lung aeration with slightly decreased edema. Electronically Signed   By: Logan Bores M.D.   On: 08/14/2016 09:06    Assessment/Plan: Diagnosis: Left AKA postop day #3, right BKA 2016 1. Does the need for close, 24 hr/day medical supervision in concert with the patient's rehab needs make it unreasonable for this patient to be served in a less intensive setting? Yes 2. Co-Morbidities requiring supervision/potential complications: Hypertension, diabetes, peripheral vascular disease 3. Due to bladder management, bowel management, safety, skin/wound care, disease management, medication administration, pain management and patient education, does the patient require 24 hr/day rehab nursing? Yes 4. Does the patient require coordinated care of a physician, rehab nurse, PT (1-2 hrs/day, 5 days/week) and OT (1-2 hrs/day, 5 days/week) to address physical and functional deficits in the context of the above medical diagnosis(es)? Yes Addressing deficits  in the following  areas: balance, endurance, locomotion, strength, transferring, bowel/bladder control, bathing, dressing, feeding, grooming, toileting and psychosocial support 5. Can the patient actively participate in an intensive therapy program of at least 3 hrs of therapy per day at least 5 days per week? Yes 6. The potential for patient to make measurable gains while on inpatient rehab is good 7. Anticipated functional outcomes upon discharge from inpatient rehab are min assist  with PT, min assist with OT, n/a with SLP. 8. Estimated rehab length of stay to reach the above functional goals is: 14-18d 9. Anticipated D/C setting: Home 10. Anticipated post D/C treatments: [place] therapy 11. Overall Rehab/Functional Prognosis: good  RECOMMENDATIONS: This patient's condition is appropriate for continued rehabilitative care in the following setting: CIR Patient has agreed to participate in recommended program. Yes Note that insurance prior authorization may be required for reimbursement for recommended care.  Comment: Will need right BKA prosthetic from home   ANGIULLI,DANIEL J., PA-C 08/16/2016

## 2016-08-16 NOTE — Progress Notes (Signed)
   Subjective:  Went for AKA yesterday, had rhonchi on pulm exam and was kept intubated overnight. She is fighting the ET tub and spontaneously breathing over the machine when seen on rounds this morning. She does say she is having pain.   Objective:  Vital signs in last 24 hours: Vitals:   08/16/16 0800 08/16/16 0801 08/16/16 1135 08/16/16 1200  BP: (!) 161/73 (!) 161/73 (!) 158/87 (!) 153/77  Pulse: 77 80  86  Resp: (!) 21 (!) 21  20  Temp:  98.1 F (36.7 C)    TempSrc:  Oral    SpO2: 99% 99% 97% 97%  Weight:      Height:       General Apperance: NAD HEENT: Normocephalic, atraumatic, anicteric sclera Neck: Supple, trachea midline Lungs: ETT in place. lungs clear to auscultation bilaterally. No wheezes or rales. Heart: Regular rate and rhythm, no murmur/rub/gallop Abdomen: Soft, epigastric tenderess, nondistended, no rebound/guarding Extremities: Warm and well perfused, R BKA, LLE in dressing Skin: No rashes Neurologic: Alert and interactive. No gross deficits.  Assessment/Plan: 56 year old [name] speaking woman with HTN, HLD, PAD, uncontrolled DM presenting with left toe osteomyelitis and systemic symptoms of fever, weakness, poor appetite, and dizziness admitted for IV antibiotics and surgical management.   Left Toe Osteomyelitis: Went for AKA yesterday - surgical culture >> polymicrobial, citrobacter freudii  -- Cont vanc, ceftriaxone and flagyl 6/19 >> will achieve source control so this can be discontinued tomorrow  -- Norco 5-325 1-2 tabs q6hours scheduled   -- IV Dilaudid 1 mg q4 hours prn, fentanyl PRN   -- Senakot-s, scheduled   Volume overload  Difficulty coming off the vent after surgery. Seems to have either stress or chronic cardiomyopathy. Received lasix this morning.  -Follow-up echo -follow up morning chest xray  -albuterol PRN   Epigastric abdominal pain  Improved. - IV protonix 40 mg daily  -zofran PRN   Hypokalemia Hypomagnesemia  -Continue  daily BMP -f/u mag tomorrow   HTN:  Systolic blood pressure 620-355 over the last 24 hours -Continue home medication amlodipine 10 mg daily and HCTZ 25 mg daily today  -IV hydral and labetalol PRN per echo care  DM, Uncontrolled Last A1c 11.4, home meds include metformin 1000 mg qd and glipizide 7.5 mg qAM. Her blood glucose trended 170-190 over past 24 hours  -  resistant sliding scale  - novolog 6 units with meals (to be held until she begins eating again after surgery)  - Lantus to 20 units daily   Hyperlipidemia  - continue atorvastatin 40 mg qd   PVD: S/p right BKA and left SFA stenting  --ASA and plavix are held now, plan is to resume 24 hours after surgery pending vvs recommendation   Depression: -- Continue home Celexa 20 mg   Low albumin Nutrition was consulted and ordered Multivitamins and boost ordered   Dispo: Anticipated discharge in approximately 2-4 day(s).   Kimberly Noss, MD  08/16/2016, 12:54 PM Pager: [phone]

## 2016-08-16 NOTE — Progress Notes (Signed)
OT Cancellation Note  Patient Details Name: Kimberly Lane MRN: [id number] DOB: [birth year]   Cancelled Treatment:    Reason Eval/Treat Not Completed: Patient not medically ready (Remains intubated and in restraints. Will follow.)  [name] 08/16/2016, 10:00 AM  [phone]

## 2016-08-16 NOTE — Progress Notes (Signed)
[facility] Progress Note Patient Name: Kimberly Lane DOB: 20-Mar-1960 MRN: [id number]   Date of Service  08/16/2016  HPI/Events of Note    eICU Interventions  Adjusted qhs insulin     Intervention Category Intermediate Interventions: Hyperglycemia - evaluation and treatment  Kimberly Lane S. 08/16/2016, 10:35 PM

## 2016-08-16 NOTE — Plan of Care (Signed)
Problem: Pain Management: Goal: General experience of comfort will improve Outcome: Progressing Effective pain management

## 2016-08-17 ENCOUNTER — Encounter (HOSPITAL_COMMUNITY): Payer: Self-pay | Admitting: *Deleted

## 2016-08-17 ENCOUNTER — Inpatient Hospital Stay (HOSPITAL_COMMUNITY): Payer: No Typology Code available for payment source

## 2016-08-17 DIAGNOSIS — Z89612 Acquired absence of left leg above knee: Secondary | ICD-10-CM

## 2016-08-17 DIAGNOSIS — R0600 Dyspnea, unspecified: Secondary | ICD-10-CM

## 2016-08-17 DIAGNOSIS — J9600 Acute respiratory failure, unspecified whether with hypoxia or hypercapnia: Secondary | ICD-10-CM

## 2016-08-17 LAB — GLUCOSE, CAPILLARY
GLUCOSE-CAPILLARY: 152 mg/dL — AB (ref 65–99)
Glucose-Capillary: 111 mg/dL — ABNORMAL HIGH (ref 65–99)
Glucose-Capillary: 114 mg/dL — ABNORMAL HIGH (ref 65–99)
Glucose-Capillary: 231 mg/dL — ABNORMAL HIGH (ref 65–99)
Glucose-Capillary: 279 mg/dL — ABNORMAL HIGH (ref 65–99)
Glucose-Capillary: 331 mg/dL — ABNORMAL HIGH (ref 65–99)
Glucose-Capillary: 66 mg/dL (ref 65–99)

## 2016-08-17 LAB — CBC WITH DIFFERENTIAL/PLATELET
Basophils Absolute: 0 10*3/uL (ref 0.0–0.1)
Basophils Relative: 0 %
EOS PCT: 0 %
Eosinophils Absolute: 0.1 10*3/uL (ref 0.0–0.7)
HEMATOCRIT: 26.1 % — AB (ref 36.0–46.0)
Hemoglobin: 7.9 g/dL — ABNORMAL LOW (ref 12.0–15.0)
LYMPHS ABS: 3.1 10*3/uL (ref 0.7–4.0)
LYMPHS PCT: 17 %
MCH: 24.3 pg — AB (ref 26.0–34.0)
MCHC: 30.3 g/dL (ref 30.0–36.0)
MCV: 80.3 fL (ref 78.0–100.0)
MONO ABS: 1 10*3/uL (ref 0.1–1.0)
MONOS PCT: 5 %
NEUTROS ABS: 13.8 10*3/uL — AB (ref 1.7–7.7)
Neutrophils Relative %: 78 %
Platelets: 453 10*3/uL — ABNORMAL HIGH (ref 150–400)
RBC: 3.25 MIL/uL — ABNORMAL LOW (ref 3.87–5.11)
RDW: 15.9 % — AB (ref 11.5–15.5)
WBC: 17.9 10*3/uL — ABNORMAL HIGH (ref 4.0–10.5)

## 2016-08-17 LAB — COMPREHENSIVE METABOLIC PANEL
ALBUMIN: 1.5 g/dL — AB (ref 3.5–5.0)
ALT: 16 U/L (ref 14–54)
ANION GAP: 8 (ref 5–15)
AST: 23 U/L (ref 15–41)
Alkaline Phosphatase: 114 U/L (ref 38–126)
BUN: 12 mg/dL (ref 6–20)
CO2: 31 mmol/L (ref 22–32)
Calcium: 8.2 mg/dL — ABNORMAL LOW (ref 8.9–10.3)
Chloride: 101 mmol/L (ref 101–111)
Creatinine, Ser: 0.95 mg/dL (ref 0.44–1.00)
GFR calc Af Amer: >60 mL/min (ref >60)
GFR calc non Af Amer: >60 mL/min (ref >60)
GLUCOSE: 183 mg/dL — AB (ref 65–99)
POTASSIUM: 3.4 mmol/L — AB (ref 3.5–5.1)
SODIUM: 140 mmol/L (ref 135–145)
Total Bilirubin: 0.4 mg/dL (ref 0.3–1.2)
Total Protein: 6.6 g/dL (ref 6.5–8.1)

## 2016-08-17 LAB — BLOOD GAS, ARTERIAL
Acid-Base Excess: 6.9 mmol/L — ABNORMAL HIGH (ref 0.0–2.0)
BICARBONATE: 31.4 mmol/L — AB (ref 20.0–28.0)
DRAWN BY: 418751
O2 Content: 4 L/min
O2 Saturation: 95.2 %
PATIENT TEMPERATURE: 98.6
PH ART: 7.42 (ref 7.350–7.450)
PO2 ART: 80 mmHg — AB (ref 83.0–108.0)
pCO2 arterial: 49.4 mmHg — ABNORMAL HIGH (ref 32.0–48.0)

## 2016-08-17 LAB — MAGNESIUM: MAGNESIUM: 1.6 mg/dL — AB (ref 1.7–2.4)

## 2016-08-17 LAB — PHOSPHORUS: Phosphorus: 3.5 mg/dL (ref 2.5–4.6)

## 2016-08-17 MED ORDER — ASPIRIN EC 81 MG PO TBEC
81.0000 mg | DELAYED_RELEASE_TABLET | Freq: Every day | ORAL | Status: DC
  Administered 2016-08-18 – 2016-08-19 (×2): 81 mg via ORAL
  Filled 2016-08-17 (×2): qty 1

## 2016-08-17 MED ORDER — HYDROMORPHONE HCL 1 MG/ML IJ SOLN
1.0000 mg | INTRAMUSCULAR | Status: DC | PRN
  Administered 2016-08-17 – 2016-08-18 (×5): 1 mg via INTRAVENOUS
  Filled 2016-08-17 (×5): qty 1

## 2016-08-17 MED ORDER — ORAL CARE MOUTH RINSE
15.0000 mL | Freq: Two times a day (BID) | OROMUCOSAL | Status: DC
  Administered 2016-08-17 – 2016-08-19 (×5): 15 mL via OROMUCOSAL

## 2016-08-17 MED ORDER — POTASSIUM CHLORIDE CRYS ER 20 MEQ PO TBCR
40.0000 meq | EXTENDED_RELEASE_TABLET | Freq: Once | ORAL | Status: AC
  Administered 2016-08-17: 40 meq via ORAL
  Filled 2016-08-17: qty 2

## 2016-08-17 MED ORDER — GLUCERNA SHAKE PO LIQD
237.0000 mL | Freq: Three times a day (TID) | ORAL | Status: DC
  Administered 2016-08-17 – 2016-08-19 (×7): 237 mL via ORAL

## 2016-08-17 MED ORDER — FUROSEMIDE 10 MG/ML IJ SOLN
20.0000 mg | Freq: Two times a day (BID) | INTRAMUSCULAR | Status: DC
  Administered 2016-08-17 – 2016-08-19 (×4): 20 mg via INTRAVENOUS
  Filled 2016-08-17 (×4): qty 2

## 2016-08-17 MED ORDER — CLOPIDOGREL BISULFATE 75 MG PO TABS
75.0000 mg | ORAL_TABLET | Freq: Every day | ORAL | Status: DC
  Administered 2016-08-17 – 2016-08-19 (×3): 75 mg via ORAL
  Filled 2016-08-17 (×3): qty 1

## 2016-08-17 MED ORDER — PANTOPRAZOLE SODIUM 40 MG PO TBEC
40.0000 mg | DELAYED_RELEASE_TABLET | Freq: Every day | ORAL | Status: DC
  Administered 2016-08-18 – 2016-08-19 (×2): 40 mg via ORAL
  Filled 2016-08-17 (×2): qty 1

## 2016-08-17 MED ORDER — MAGNESIUM SULFATE 2 GM/50ML IV SOLN
2.0000 g | Freq: Once | INTRAVENOUS | Status: AC
  Administered 2016-08-17: 2 g via INTRAVENOUS
  Filled 2016-08-17: qty 50

## 2016-08-17 NOTE — Evaluation (Signed)
Occupational Therapy Evaluation Patient Details Name: Kimberly Lane MRN: [id number] DOB: 07/09/1960 Today's Date: 08/17/2016    History of Present Illness This 56 y.o. female admitted 08/08/16 with Lt toe osteomeylitis.  She underwent Lt AKA 08/15/16.  PMH includes:  Rt BKA, anemia, anxiety, cellulitis and abecess of foot, DM, HTN, +TB test (2013)   Clinical Impression   Pt admitted with above. She demonstrates the below listed deficits and will benefit from continued OT to maximize safety and independence with BADLs.  Pt was able to move to EOB with total A +2.  Sitting tolerance was limited by orthostasis (see below), and pain.  She currently requires mod - total A for ADLs and lives with spouse at home.  Feel she will need post acute rehab at discharge.  Recommend CIR consult.       Follow Up Recommendations  CIR;Supervision/Assistance - 24 hour    Equipment Recommendations  Other (comment) (drop arm commode )    Recommendations for Other Services Rehab consult     Precautions / Restrictions Precautions Precautions: Fall Restrictions Weight Bearing Restrictions: No RLE Weight Bearing: Non weight bearing LLE Weight Bearing: Non weight bearing      Mobility Bed Mobility Overal bed mobility: Needs Assistance Bed Mobility: Supine to Sit;Sit to Supine     Supine to sit: Total assist;+2 for physical assistance Sit to supine: Total assist;+2 for physical assistance   General bed mobility comments: Pt able to assist minimally with lifting and lowering trunk from/to bed.  She requires assist with all other aspects.  Pt pushes posteiorly in response to pain   Transfers                 General transfer comment: unable to attempt due to orthostasis     Balance Overall balance assessment: Needs assistance Sitting-balance support: Bilateral upper extremity supported Sitting balance-Leahy Scale: Poor Sitting balance - Comments: requires UE support and intermittent min A.   Postural control: Posterior lean                                 ADL either performed or assessed with clinical judgement   ADL Overall ADL's : Needs assistance/impaired Eating/Feeding: Independent   Grooming: Wash/dry hands;Wash/dry face;Oral care;Brushing hair;Set up;Sitting   Upper Body Bathing: Minimal assistance;Bed level   Lower Body Bathing: Maximal assistance;Bed level   Upper Body Dressing : Moderate assistance;Sitting;Bed level   Lower Body Dressing: Total assistance;Bed level   Toilet Transfer: Total assistance Toilet Transfer Details (indicate cue type and reason): unable to attempt  Toileting- Clothing Manipulation and Hygiene: Total assistance;Bed level       Functional mobility during ADLs: +2 for physical assistance;Total assistance       Vision         Perception     Praxis      Pertinent Vitals/Pain Pain Assessment: Faces Faces Pain Scale: Hurts whole lot Pain Location: Lt residual limb  Pain Descriptors / Indicators: Grimacing;Guarding Pain Intervention(s): Limited activity within patient's tolerance;Monitored during session;Repositioned;RN gave pain meds during session     Hand Dominance Right   Extremity/Trunk Assessment Upper Extremity Assessment Upper Extremity Assessment: Generalized weakness   Lower Extremity Assessment Lower Extremity Assessment: Defer to PT evaluation   Cervical / Trunk Assessment Cervical / Trunk Assessment: Normal   Communication Communication Communication: Prefers language other than English Engineer, manufacturing systems #DKAR utilized )   Cognition Arousal/Alertness: Awake/alert Behavior During Therapy: [facility]  for tasks assessed/performed Overall Cognitive Status: Within Functional Limits for tasks assessed                                     General Comments  BP supine 142/60; sitting EOB 104/82 - pt endorses dizziness; supine 134/92    Exercises     Shoulder Instructions       Home Living Family/patient expects to be discharged to:: Private residence Living Arrangements: Spouse/significant other Available Help at Discharge: Family;Available PRN/intermittently (spouse works night shift )   Home Access: Level entry                     Home Equipment: Wheelchair - manual   Additional Comments: Pt reports she sleeps on a mattress on the floor as her bed is too high to transfer into.  She indicates she is able to maneuver w/c throughout her apt and into the bathroom.  She states there is only one area she cannot access via w/c       Prior Functioning/Environment Level of Independence: Needs assistance  Gait / Transfers Assistance Needed: uses w/c for mobility at home  ADL's / Homemaking Assistance Needed: Pt requires assist    Comments: Pt is from the [place]         OT Problem List: Decreased strength;Decreased activity tolerance;Impaired balance (sitting and/or standing);Decreased knowledge of use of DME or AE;Cardiopulmonary status limiting activity;Pain      OT Treatment/Interventions: Self-care/ADL training;DME and/or AE instruction;Therapeutic activities;Patient/family education;Balance training    OT Goals(Current goals can be found in the care plan section) Acute Rehab OT Goals Patient Stated Goal: Pt did not state  OT Goal Formulation: With patient Time For Goal Achievement: 08/31/16 Potential to Achieve Goals: Good ADL Goals Pt Will Perform Grooming: with supervision;sitting Pt Will Perform Upper Body Bathing: with supervision;sitting Pt Will Perform Upper Body Dressing: bed level;with supervision Pt Will Perform Lower Body Dressing: with min assist;bed level Pt Will Transfer to Toilet: with min assist;anterior/posterior transfer;bedside commode;grab bars  OT Frequency: Min 2X/week   Barriers to D/C:            Co-evaluation PT/OT/SLP Co-Evaluation/Treatment: Yes Reason for Co-Treatment: For patient/therapist  safety;Complexity of the patient's impairments (multi-system involvement)   OT goals addressed during session: ADL's and self-care      AM-PAC PT "6 Clicks" Daily Activity     Outcome Measure Help from another person eating meals?: None Help from another person taking care of personal grooming?: A Little Help from another person toileting, which includes using toliet, bedpan, or urinal?: Total Help from another person bathing (including washing, rinsing, drying)?: A Lot Help from another person to put on and taking off regular upper body clothing?: A Lot Help from another person to put on and taking off regular lower body clothing?: Total 6 Click Score: 13   End of Session Equipment Utilized During Treatment: Oxygen Nurse Communication: Mobility status;Patient requests pain meds  Activity Tolerance: Patient limited by pain;Other (comment) (orthostasis) Patient left: in bed;with call bell/phone within reach;with nursing/sitter in room  OT Visit Diagnosis: Pain Pain - Right/Left: Left Pain - part of body: Leg                Time: 1017-5102 OT Time Calculation (min): 35 min Charges:  OT General Charges $OT Visit: 1 Procedure OT Evaluation $OT Eval Moderate Complexity: 1 Procedure G-Codes:  Janean Eischen [place], OTR/L [phone]   Lucille Passy M 08/17/2016, 1:24 PM

## 2016-08-17 NOTE — Progress Notes (Signed)
   Subjective: When seen on rounds this morning she is having leg pain but otherwise is free of complaints. She denies difficulty breathing or chest pain. He was able to eat some food last night and this morning.  Objective:  Vital signs in last 24 hours: Vitals:   08/17/16 0800 08/17/16 0854 08/17/16 0900 08/17/16 1000  BP: 136/62  126/60 118/61  Pulse: 67  74 72  Resp: 17  17 12   Temp:  98.4 F (36.9 C)    TempSrc:  Oral    SpO2: 96%  97% 98%  Weight:      Height:       Physical Exam  Constitutional: She appears well-developed and well-nourished. No distress.  HENT:  Head: Normocephalic and atraumatic.  Cardiovascular: Normal rate and regular rhythm.   No murmur heard. Pulmonary/Chest: Effort normal. No respiratory distress. She has no wheezes. She has no rales.  Abdominal: Soft. She exhibits no distension. There is no tenderness. There is no guarding.  Neurological: She is alert.  Skin: She is not diaphoretic.   Assessment/Plan:  Active Problems:   Essential hypertension   DM (diabetes mellitus), type 2, uncontrolled (HCC)   S/P BKA (below knee amputation) unilateral, right (HCC)   Osteomyelitis of left foot (HCC)   PAD (peripheral artery disease) (HCC)   Gangrene due to peripheral vascular disease (HCC)   Dyspnea   S/P AKA (above knee amputation), left (HCC)  S/p left aka  Osteomyelitis of the left foot  Peripheral artery diseases  - AKA 2 days ago, will DC abx now that we have source control (she has received 9 days)   Dyspnea  Pulmonary edema   Lungs remain today but she is requiring nasal canula  - follow up echo  - IV lasix 20 mg BID    DM type 2 uncontrolled  Blood glucose well controlled  - continue lantus, ISS, and novolog 6 units with meals   HTN  Remains normotensive - continue home meds amlodipine and HCTZ   HLD  Continue atorvastatin 40 mg qd   PVD s/p right BKA and left SFA stenting  --ASA and plavix are held now, plan is to resume 24  hours after surgery pending vvs recommendation   Depression  - continue celexa 20 mg daily   Low albumin Nutrition was consulted and ordered Multivitamins and glucerna    Dispo: Anticipated discharge in approximately 2-4 day(s).   Ledell Noss, MD 08/17/2016, 11:44 AM Pager: [phone]

## 2016-08-17 NOTE — Progress Notes (Addendum)
Nutrition Follow Up  DOCUMENTATION CODES:   Not applicable  INTERVENTION:    Continue Glucerna Shake po TID, each supplement provides 220 kcal and 10 grams of protein  NUTRITION DIAGNOSIS:   Increased nutrient needs related to wound healing, chronic illness as evidenced by estimated needs, ongoing  GOAL:   Patient will meet greater than or equal to 90% of their needs, progressing   MONITOR:   PO intake, Supplement acceptance, Labs, Weight trends, Skin, I & O's  ASSESSMENT:   "56 year old [name] female with extensive past medical history of hypertension, uncontrolled diabetes, hyperlipidemia, peripheral arterial disease for which she is actually had a right below-the-knee as well as left femoral artery stenting, who presents from her PCP due to worsening left first toe gangrene." Copied from Internal Medicine note, Velna Ochs, MD, 08/08/16.   Pt s/p open L great toe amputation 6/19, L AKA 6/25. PO intake limited at 40% per flowsheet records. Medications reviewed and include MVI. Labs reviewed. K 3.4 (L). CBG's [phone].  Diet Order:  Diet Carb Modified Fluid consistency: Thin; Room service appropriate? Yes  Skin:  Wound (see comment) (R BKA & L AKA)  Last BM:  6/27  Height:   Ht Readings from Last 1 Encounters:  08/15/16 5\' 2"  (1.575 m)   Weight:   Wt Readings from Last 1 Encounters:  08/17/16 132 lb 11.5 oz (60.2 kg)   BMI:  29.0 kg/m2 >>> adjusted for amputations   Estimated Nutritional Needs:   Kcal:  1700-1900  Protein:  90-100 gm  Fluid:  1.7-1.9 L  EDUCATION NEEDS:   No education needs identified at this time  Arthur Holms, RD, LDN Pager #: [phone] After-Hours Pager #: [phone]

## 2016-08-17 NOTE — Evaluation (Signed)
Physical Therapy Evaluation Patient Details Name: Kimberly Lane MRN: [id number] DOB: 08/14/1960 Today's Date: 08/17/2016   History of Present Illness  This 56 y.o. female admitted 08/08/16 with Lt toe osteomeylitis.  She underwent Lt AKA 08/15/16.  PMH includes:  Rt BKA, anemia, anxiety, cellulitis and abecess of foot, DM, HTN, +TB test (2013)  Clinical Impression  Orders received for PT evaluation. Patient demonstrates deficits in functional mobility as indicated below. Will benefit from continued skilled PT to address deficits and maximize function. Will see as indicated and progress as tolerated.  Patient able to sit EOB with min guard after +2 total assist to transition. Sitting tolerance at EOB limited by symptomatic orthostasis. Nsg aware. Recommend CIR consult at this time for transfer training and decrease BOC.    Follow Up Recommendations CIR;Supervision/Assistance - 24 hour    Equipment Recommendations   (TBD)    Recommendations for Other Services       Precautions / Restrictions Precautions Precautions: Fall Restrictions Weight Bearing Restrictions: No RLE Weight Bearing: Non weight bearing LLE Weight Bearing: Non weight bearing      Mobility  Bed Mobility Overal bed mobility: Needs Assistance Bed Mobility: Supine to Sit;Sit to Supine     Supine to sit: Total assist;+2 for physical assistance Sit to supine: Total assist;+2 for physical assistance   General bed mobility comments: Pt able to assist minimally with lifting and lowering trunk from/to bed.  She requires assist with all other aspects.  Pt pushes posteiorly in response to pain   Transfers                 General transfer comment: unable to attempt due to orthostasis   Ambulation/Gait                Stairs            Wheelchair Mobility    Modified Rankin (Stroke Patients Only)       Balance Overall balance assessment: Needs assistance Sitting-balance support: Bilateral upper  extremity supported Sitting balance-Leahy Scale: Poor Sitting balance - Comments: requires UE support and intermittent min A.  Postural control: Posterior lean                                   Pertinent Vitals/Pain Pain Assessment: Faces Faces Pain Scale: Hurts whole lot Pain Location: Lt residual limb  Pain Descriptors / Indicators: Grimacing;Guarding Pain Intervention(s): Limited activity within patient's tolerance;Monitored during session;Repositioned;RN gave pain meds during session    [place] expects to be discharged to:: Private residence Living Arrangements: Spouse/significant other Available Help at Discharge: Family;Available PRN/intermittently (spouse works night shift )   Home Access: Level entry       Home Equipment: Wheelchair - manual Additional Comments: Pt reports she sleeps on a mattress on the floor as her bed is too high to transfer into.  She indicates she is able to maneuver w/c throughout her apt and into the bathroom.  She states there is only one area she cannot access via w/c     Prior Function Level of Independence: Needs assistance   Gait / Transfers Assistance Needed: uses w/c for mobility at home   ADL's / Homemaking Assistance Needed: Pt requires assist   Comments: Pt is from the [place]      Hand Dominance   Dominant Hand: Right    Extremity/Trunk Assessment   Upper Extremity Assessment Upper Extremity Assessment: Generalized  weakness    Lower Extremity Assessment Lower Extremity Assessment: Generalized weakness;RLE deficits/detail;LLE deficits/detail LLE Deficits / Details: new left aka LLE: Unable to fully assess due to pain    Cervical / Trunk Assessment Cervical / Trunk Assessment: Normal  Communication   Communication: Prefers language other than English Engineer, manufacturing systems #DKAR utilized )  Cognition Arousal/Alertness: Awake/alert Behavior During Therapy: WFL for tasks  assessed/performed Overall Cognitive Status: Within Functional Limits for tasks assessed                                        General Comments General comments (skin integrity, edema, etc.): BP supine 142/60; sitting EOB 104/82 - pt endorses dizziness; supine 134/92    Exercises     Assessment/Plan    PT Assessment Patient needs continued PT services  PT Problem List Pain;Decreased strength;Decreased activity tolerance;Decreased balance;Decreased mobility       PT Treatment Interventions DME instruction;Functional mobility training;Therapeutic activities;Therapeutic exercise;Balance training;Neuromuscular re-education;Cognitive remediation;Patient/family education;Wheelchair mobility training;Manual techniques;Modalities    PT Goals (Current goals can be found in the Care Plan section)  Acute Rehab PT Goals Patient Stated Goal: Pt did not state  PT Goal Formulation: Patient unable to participate in goal setting Time For Goal Achievement: 08/31/16 Potential to Achieve Goals: Good    Frequency Min 3X/week   Barriers to discharge        Co-evaluation PT/OT/SLP Co-Evaluation/Treatment: Yes Reason for Co-Treatment: For patient/therapist safety;Complexity of the patient's impairments (multi-system involvement) PT goals addressed during session: Mobility/safety with mobility;Balance OT goals addressed during session: ADL's and self-care       AM-PAC PT "6 Clicks" Daily Activity  Outcome Measure Difficulty turning over in bed (including adjusting bedclothes, sheets and blankets)?: Total Difficulty moving from lying on back to sitting on the side of the bed? : Total Difficulty sitting down on and standing up from a chair with arms (e.g., wheelchair, bedside commode, etc,.)?: Total Help needed moving to and from a bed to chair (including a wheelchair)?: Total Help needed walking in hospital room?: Total Help needed climbing 3-5 steps with a railing? : Total 6  Click Score: 6    End of Session Equipment Utilized During Treatment: Oxygen Activity Tolerance: No increased pain;Treatment limited secondary to medical complications (Comment) (dizziness, orthostatic BP (71IWPY drop in systolic); Pain) Patient left: in bed;with call bell/phone within reach;with bed alarm set;with nursing/sitter in room Nurse Communication: Mobility status PT Visit Diagnosis: Pain;Other (comment) Pain - Right/Left: Left Pain - part of body:  (Residual limb post acute AKA)    Time: 0998-3382 PT Time Calculation (min) (ACUTE ONLY): 35 min   Charges:   PT Evaluation $PT Eval Moderate Complexity: 1 Procedure     PT G Codes:        Alben Deeds, PT DPT NCS [phone]   Duncan Dull 08/17/2016, 3:49 PM

## 2016-08-17 NOTE — Progress Notes (Signed)
Hypoglycemic Event  CBG: 66  Treatment: 15 GM carbohydrate snack    Symptoms: None  Follow-up CBG: Time: 4:09pm CBG Result:111  Possible Reasons for Event: Unknown  Comments/MD notified:    Ottie Glazier

## 2016-08-17 NOTE — Progress Notes (Signed)
Discussed with Dr. Heber [place] and Int Med team. They will take over care today as she is stable post extubation Due for transfer from ICU. PCCM will sign off. Please call with any questions.   Marshell Garfinkel MD [facility] Pager [phone] If no answer or after 3pm call: [phone] 08/17/2016, 11:51 AM

## 2016-08-17 NOTE — Progress Notes (Addendum)
Reported all abnormal lab values to internal medicine resident.  Orders to replace Potassium and Magnesium.  Will replace and continue to monitor.

## 2016-08-17 NOTE — Progress Notes (Signed)
  Progress Note    08/17/2016 8:39 AM 2 Days Post-Op  Subjective:  Having left leg pain, breathing ok  Vitals:   08/17/16 0700 08/17/16 0800  BP: 115/74 136/62  Pulse: 72 67  Resp: 11 17  Temp:      Physical Exam: Awake and alert, extubated Non labored respirations Abdomen is soft  Left aka site cdi with staples  CBC    Component Value Date/Time   WBC 17.9 (H) 08/17/2016 0324   RBC 3.25 (L) 08/17/2016 0324   HGB 7.9 (L) 08/17/2016 0324   HCT 26.1 (L) 08/17/2016 0324   PLT 453 (H) 08/17/2016 0324   MCV 80.3 08/17/2016 0324   MCH 24.3 (L) 08/17/2016 0324   MCHC 30.3 08/17/2016 0324   RDW 15.9 (H) 08/17/2016 0324   LYMPHSABS 3.1 08/17/2016 0324   MONOABS 1.0 08/17/2016 0324   EOSABS 0.1 08/17/2016 0324   BASOSABS 0.0 08/17/2016 0324    BMET    Component Value Date/Time   NA 140 08/17/2016 0324   K 3.4 (L) 08/17/2016 0324   CL 101 08/17/2016 0324   CO2 31 08/17/2016 0324   GLUCOSE 183 (H) 08/17/2016 0324   BUN 12 08/17/2016 0324   CREATININE 0.95 08/17/2016 0324   CREATININE 0.58 12/18/2013 1515   CALCIUM 8.2 (L) 08/17/2016 0324   GFRNONAA >60 08/17/2016 0324   GFRAA >60 08/17/2016 0324    INR    Component Value Date/Time   INR 1.18 08/13/2016 0348     Intake/Output Summary (Last 24 hours) at 08/17/16 0839 Last data filed at 08/17/16 0600  Gross per 24 hour  Intake              780 ml  Output             2300 ml  Net            -1520 ml     Assessment:  56 y.o. female is s/p left aka, now extubated  Plan: Dressing down today, replace for comfort  Ivry Pigue C. Donzetta Matters, MD [facility] Office: [phone] Pager: [phone]  08/17/2016 8:39 AM

## 2016-08-18 ENCOUNTER — Inpatient Hospital Stay (HOSPITAL_COMMUNITY): Payer: No Typology Code available for payment source

## 2016-08-18 DIAGNOSIS — R0600 Dyspnea, unspecified: Secondary | ICD-10-CM

## 2016-08-18 DIAGNOSIS — E11649 Type 2 diabetes mellitus with hypoglycemia without coma: Secondary | ICD-10-CM

## 2016-08-18 LAB — ECHOCARDIOGRAM COMPLETE
Ao-asc: 24 cm
CHL CUP DOP CALC LVOT VTI: 34 cm
CHL CUP MV DEC (S): 229
CHL CUP STROKE VOLUME: 50 mL
E decel time: 229 msec
E/e' ratio: 16.09
FS: 27 % — AB (ref 28–44)
Height: 62 in
IVS/LV PW RATIO, ED: 1.29
LA diam index: 2.35 cm/m2
LA vol index: 37.7 mL/m2
LASIZE: 39 mm
LAVOL: 62.5 mL
LAVOLA4C: 69.3 mL
LDCA: 2.54 cm2
LEFT ATRIUM END SYS DIAM: 39 mm
LV E/e'average: 16.09
LV TDI E'LATERAL: 5.7
LV sys vol index: 16 mL/m2
LV sys vol: 27 mL (ref 14–42)
LVDIAVOL: 78 mL (ref 46–106)
LVDIAVOLIN: 47 mL/m2
LVEEMED: 16.09
LVELAT: 5.7 cm/s
LVOT peak grad rest: 9 mmHg
LVOT peak vel: 149 cm/s
LVOTD: 18 mm
LVOTSV: 86 mL
MV pk E vel: 91.7 m/s
MVPG: 3 mmHg
MVPKAVEL: 113 m/s
PW: 11.8 mm — AB (ref 0.6–1.1)
RV LATERAL S' VELOCITY: 21.4 cm/s
Simpson's disk: 65
TAPSE: 23.9 mm
TDI e' medial: 6.13
Weight: 2299.84 oz

## 2016-08-18 LAB — BASIC METABOLIC PANEL
Anion gap: 9 (ref 5–15)
BUN: 10 mg/dL (ref 6–20)
CHLORIDE: 101 mmol/L (ref 101–111)
CO2: 31 mmol/L (ref 22–32)
Calcium: 8.2 mg/dL — ABNORMAL LOW (ref 8.9–10.3)
Creatinine, Ser: 0.75 mg/dL (ref 0.44–1.00)
GFR calc Af Amer: >60 mL/min (ref >60)
GFR calc non Af Amer: >60 mL/min (ref >60)
GLUCOSE: 79 mg/dL (ref 65–99)
POTASSIUM: 3.5 mmol/L (ref 3.5–5.1)
Sodium: 141 mmol/L (ref 135–145)

## 2016-08-18 LAB — GLUCOSE, CAPILLARY
GLUCOSE-CAPILLARY: 79 mg/dL (ref 65–99)
GLUCOSE-CAPILLARY: 169 mg/dL — AB (ref 65–99)
Glucose-Capillary: 112 mg/dL — ABNORMAL HIGH (ref 65–99)
Glucose-Capillary: 161 mg/dL — ABNORMAL HIGH (ref 65–99)

## 2016-08-18 MED ORDER — OXYCODONE HCL 5 MG PO TABS
5.0000 mg | ORAL_TABLET | ORAL | Status: DC | PRN
  Administered 2016-08-19: 5 mg via ORAL
  Filled 2016-08-18: qty 1

## 2016-08-18 MED ORDER — DEXAMETHASONE SODIUM PHOSPHATE 10 MG/ML IJ SOLN
INTRAMUSCULAR | Status: AC
  Administered 2016-08-18: 10 mg
  Filled 2016-08-18: qty 1

## 2016-08-18 MED ORDER — HYDROCODONE-ACETAMINOPHEN 5-325 MG PO TABS
2.0000 | ORAL_TABLET | ORAL | Status: DC
  Administered 2016-08-18 – 2016-08-19 (×6): 2 via ORAL
  Filled 2016-08-18 (×6): qty 2

## 2016-08-18 MED ORDER — INSULIN GLARGINE 100 UNIT/ML ~~LOC~~ SOLN
16.0000 [IU] | Freq: Every day | SUBCUTANEOUS | Status: DC
  Administered 2016-08-18: 16 [IU] via SUBCUTANEOUS
  Filled 2016-08-18 (×2): qty 0.16

## 2016-08-18 MED FILL — Perflutren Lipid Microsphere IV Susp 1.1 MG/ML: INTRAVENOUS | Qty: 10 | Status: AC

## 2016-08-18 NOTE — Progress Notes (Addendum)
I contacted Seth Bake, hospital liaison for [facility] to discuss rehab dispo. She has discussed case with Dr. Jimmye Norman of [place] and inpt rehab admit is recommended rather than SNF at this time. I will follow up with pt and spouse with interpretor tomorrow to assess if pt would like to be admitted to inpt rehab in the next 24-48 hrs as she received her rehab in 2016 after first amputation with inpt rehab. I have left a message for SW to contact me to give updated plan for dispo.[phone]

## 2016-08-18 NOTE — Progress Notes (Signed)
I will contact an interpreter and the SW in the community involved with this pt to begin discussions concerning dispo needs. [phone]

## 2016-08-18 NOTE — Progress Notes (Signed)
  Echocardiogram 2D Echocardiogram has been performed.  Randa Lynn [facility] Niblett 08/18/2016, 12:47 PM

## 2016-08-18 NOTE — Progress Notes (Signed)
   Subjective: When seen on rounds this morning she is having leg pain but otherwise is free of complaints. She denies difficulty breathing or chest pain. He was able to eat some food last night and this morning.  Objective:  Vital signs in last 24 hours: Vitals:   08/17/16 1556 08/17/16 2122 08/18/16 0500 08/18/16 1300  BP: 133/74 (!) 152/63 123/67 (!) 155/67  Pulse: 73 76 86 77  Resp: 16 16 16 20   Temp: 98.8 F (37.1 C) 98.7 F (37.1 C) 98.4 F (36.9 C) 98.2 F (36.8 C)  TempSrc: Oral Oral Oral   SpO2: 95% 93% 95% 97%  Weight:   143 lb 11.8 oz (65.2 kg)   Height:       Physical Exam  Constitutional: She appears well-developed and well-nourished. No distress.  HENT:  Head: Normocephalic and atraumatic.  Cardiovascular: Normal rate and regular rhythm.   No murmur heard. Pulmonary/Chest: Effort normal. No respiratory distress. She has no wheezes. She has no rales.  Abdominal: Soft. She exhibits no distension. There is no tenderness. There is no guarding.  Neurological: She is alert.  Skin: She is not diaphoretic.   Assessment/Plan:  Principal Problem:   Gangrene due to peripheral vascular disease (HCC) Active Problems:   Essential hypertension   DM (diabetes mellitus), type 2, uncontrolled (HCC)   S/P BKA (below knee amputation) unilateral, right ([place])   Osteomyelitis of left foot (HCC)   PAD (peripheral artery disease) (HCC)   Dyspnea   S/P AKA (above knee amputation), left (HCC)   Acute respiratory failure (HCC)   Osteomyelitis of the left foot  S/p left aka  AKA was 2 days ago, antibiotics D/Ced yesterday, afebrile overnight and leukocytosis improved yesterday. Her pain is not yet controleld but leg seems to be healing nicely. Dr. Donzetta Matters will follow up with her in 2-3 weeks for staple removal.  - increase norco frequency to q4h  - continue dilaudid PRN q2h   Peripheral artery diseases  Restarted aspirin and Plavix today   Dyspnea  Pulmonary edema   Lungs  remain clear today and she is on nasal canula but finger readings for the pulse ox may not be as accurate so we will have her switched to forehead monitoring.   - follow up echo  - IV lasix 20 mg BID  DM type 2 uncontrolled  Blood glucose control has improved gradually since the time of her AKA.  - continue lantus 16 units daily, ISS, and novolog 6 units with meals   HTN  Remains normotensive - continue home meds amlodipine and HCTZ   HLD  Continue atorvastatin 40 mg qd   PVD s/p right BKA and left SFA stenting  --ASA and plavix have been resumed   Depression  - continue celexa 20 mg daily   Low albumin Nutrition was consulted and ordered Multivitamins and glucerna    Dispo: Anticipated discharge in approximately 2-4 day(s).   Ledell Noss, MD 08/18/2016, 3:01 PM Pager: [phone]

## 2016-08-18 NOTE — Clinical Social Work Note (Signed)
Clinical Social Work Assessment  Patient Details  Name: Kimberly Lane MRN: [id number] Date of Birth: 09-25-1960  Date of referral:  08/18/16               Reason for consult:  Facility Placement                Permission sought to share information with:    Permission granted to share information::  Yes, Verbal Permission Granted  Name::     [facility], Nurse  Agency::  SNF  Relationship::     Contact Information:     Housing/Transportation Living arrangements for the past 2 months:  [place] of Information:  Other (Comment Required) ([facility] Nurse Seth Bake) Patient Interpreter Needed:    Criminal Activity/Legal Involvement Pertinent to Current Situation/Hospitalization:  No - Comment as needed Significant Relationships:  Other Family Members Lives with:  Relatives Do you feel safe going back to the place where you live?  No Need for family participation in patient care:  Yes (Comment)  Care giving concerns:  Pt resided with family prior to hospitalization. Pt has a previous Right BKA and recently had a Left BKA. Pt unable to be cared for at home due to impairment and health conditions. Pt will require skilled nursing. Patient is medically managed by [facility]. CSW spoke with Nurse, Seth Bake, who advised that patient will be DC[facility]g to [facility] when medically stable. CSW with follow up with [facility] on same to confirm.  Social Worker assessment / plan:  Pt going to [facility] for Skilled nursing needs. PASSR is pending as Dr. Aletha Halim need to sign 30 day note for rehab. FL2 is pending. CSW will send offer to [facility] once pssr obtained and fl2 obtained.  Employment status:  Unemployed Forensic scientist:  [place] PT Recommendations:  [place] / Referral to community resources:  [place]  Patient/Family's Response to care:  Patient/family has good understanding of the plan for SNF. No issues with  care.  Patient/Family's Understanding of and Emotional Response to Diagnosis, Current Treatment, and Prognosis:  Patient has good understanding of the diagnosis, current treatment and prognosis. No issues or concerns at this time.  Emotional Assessment Appearance:  Appears older than stated age Attitude/Demeanor/Rapport:   (Cooperative) Affect (typically observed):   (Cooperative) Orientation:  Oriented to Place, Oriented to Self, Oriented to  Time, Oriented to Situation Alcohol / Substance use:  Not Applicable Psych involvement (Current and /or in the community):  No (Comment)  Discharge Needs  Concerns to be addressed:  Care Coordination Readmission within the last 30 days:  No Current discharge risk:  None Barriers to Discharge:  No Barriers Identified   Normajean Baxter, LCSW 08/18/2016, 4:10 PM

## 2016-08-18 NOTE — Progress Notes (Signed)
  Progress Note    08/18/2016 9:42 AM 3 Days Post-Op  Subjective:  No acute issues  Vitals:   08/17/16 2122 08/18/16 0500  BP: (!) 152/63 123/67  Pulse: 76 86  Resp: 16 16  Temp: 98.7 F (37.1 C) 98.4 F (36.9 C)    Physical Exam: Staples cdi left aka site  CBC    Component Value Date/Time   WBC 17.9 (H) 08/17/2016 0324   RBC 3.25 (L) 08/17/2016 0324   HGB 7.9 (L) 08/17/2016 0324   HCT 26.1 (L) 08/17/2016 0324   PLT 453 (H) 08/17/2016 0324   MCV 80.3 08/17/2016 0324   MCH 24.3 (L) 08/17/2016 0324   MCHC 30.3 08/17/2016 0324   RDW 15.9 (H) 08/17/2016 0324   LYMPHSABS 3.1 08/17/2016 0324   MONOABS 1.0 08/17/2016 0324   EOSABS 0.1 08/17/2016 0324   BASOSABS 0.0 08/17/2016 0324    BMET    Component Value Date/Time   NA 141 08/18/2016 0558   K 3.5 08/18/2016 0558   CL 101 08/18/2016 0558   CO2 31 08/18/2016 0558   GLUCOSE 79 08/18/2016 0558   BUN 10 08/18/2016 0558   CREATININE 0.75 08/18/2016 0558   CREATININE 0.58 12/18/2013 1515   CALCIUM 8.2 (L) 08/18/2016 0558   GFRNONAA >60 08/18/2016 0558   GFRAA >60 08/18/2016 0558    INR    Component Value Date/Time   INR 1.18 08/13/2016 0348     Intake/Output Summary (Last 24 hours) at 08/18/16 0942 Last data filed at 08/18/16 8676  Gross per 24 hour  Intake              170 ml  Output              125 ml  Net               45 ml     Assessment:  56 y.o. female is s/p left aka, stable in floor bed  Plan: Aka healing well Will have f/u in 2-3 weeks for staple removal   Jayci Ellefson C. Donzetta Matters, MD [facility] Office: [phone] Pager: [phone]  08/18/2016 9:42 AM

## 2016-08-19 ENCOUNTER — Encounter (HOSPITAL_COMMUNITY): Payer: Self-pay | Admitting: *Deleted

## 2016-08-19 ENCOUNTER — Inpatient Hospital Stay (HOSPITAL_COMMUNITY)
Admission: RE | Admit: 2016-08-19 | Discharge: 2016-09-01 | DRG: 560 | Disposition: A | Discharge status: Skilled Nursing Facility | Payer: No Typology Code available for payment source | Source: Intra-hospital | Attending: Physical Medicine & Rehabilitation | Admitting: Physical Medicine & Rehabilitation

## 2016-08-19 DIAGNOSIS — F419 Anxiety disorder, unspecified: Secondary | ICD-10-CM | POA: Diagnosis present

## 2016-08-19 DIAGNOSIS — Z79899 Other long term (current) drug therapy: Secondary | ICD-10-CM

## 2016-08-19 DIAGNOSIS — Z7982 Long term (current) use of aspirin: Secondary | ICD-10-CM

## 2016-08-19 DIAGNOSIS — I10 Essential (primary) hypertension: Secondary | ICD-10-CM | POA: Diagnosis present

## 2016-08-19 DIAGNOSIS — D72829 Elevated white blood cell count, unspecified: Secondary | ICD-10-CM | POA: Diagnosis present

## 2016-08-19 DIAGNOSIS — R079 Chest pain, unspecified: Secondary | ICD-10-CM | POA: Diagnosis present

## 2016-08-19 DIAGNOSIS — E1142 Type 2 diabetes mellitus with diabetic polyneuropathy: Secondary | ICD-10-CM | POA: Diagnosis present

## 2016-08-19 DIAGNOSIS — E1165 Type 2 diabetes mellitus with hyperglycemia: Secondary | ICD-10-CM | POA: Diagnosis present

## 2016-08-19 DIAGNOSIS — Z89612 Acquired absence of left leg above knee: Secondary | ICD-10-CM

## 2016-08-19 DIAGNOSIS — E876 Hypokalemia: Secondary | ICD-10-CM | POA: Diagnosis present

## 2016-08-19 DIAGNOSIS — E118 Type 2 diabetes mellitus with unspecified complications: Secondary | ICD-10-CM

## 2016-08-19 DIAGNOSIS — E1151 Type 2 diabetes mellitus with diabetic peripheral angiopathy without gangrene: Secondary | ICD-10-CM | POA: Diagnosis present

## 2016-08-19 DIAGNOSIS — Z89511 Acquired absence of right leg below knee: Secondary | ICD-10-CM

## 2016-08-19 DIAGNOSIS — I96 Gangrene, not elsewhere classified: Secondary | ICD-10-CM

## 2016-08-19 DIAGNOSIS — G629 Polyneuropathy, unspecified: Secondary | ICD-10-CM

## 2016-08-19 DIAGNOSIS — Z7902 Long term (current) use of antithrombotics/antiplatelets: Secondary | ICD-10-CM | POA: Diagnosis not present

## 2016-08-19 DIAGNOSIS — Z888 Allergy status to other drugs, medicaments and biological substances status: Secondary | ICD-10-CM

## 2016-08-19 DIAGNOSIS — Z882 Allergy status to sulfonamides status: Secondary | ICD-10-CM | POA: Diagnosis not present

## 2016-08-19 DIAGNOSIS — J9600 Acute respiratory failure, unspecified whether with hypoxia or hypercapnia: Secondary | ICD-10-CM

## 2016-08-19 DIAGNOSIS — D62 Acute posthemorrhagic anemia: Secondary | ICD-10-CM | POA: Clinically undetermined

## 2016-08-19 DIAGNOSIS — M25552 Pain in left hip: Secondary | ICD-10-CM

## 2016-08-19 DIAGNOSIS — I5031 Acute diastolic (congestive) heart failure: Secondary | ICD-10-CM

## 2016-08-19 DIAGNOSIS — S78112A Complete traumatic amputation at level between left hip and knee, initial encounter: Secondary | ICD-10-CM

## 2016-08-19 DIAGNOSIS — Z7984 Long term (current) use of oral hypoglycemic drugs: Secondary | ICD-10-CM

## 2016-08-19 DIAGNOSIS — R0989 Other specified symptoms and signs involving the circulatory and respiratory systems: Secondary | ICD-10-CM

## 2016-08-19 DIAGNOSIS — R7309 Other abnormal glucose: Secondary | ICD-10-CM

## 2016-08-19 DIAGNOSIS — IMO0002 Reserved for concepts with insufficient information to code with codable children: Secondary | ICD-10-CM

## 2016-08-19 DIAGNOSIS — G8918 Other acute postprocedural pain: Secondary | ICD-10-CM

## 2016-08-19 DIAGNOSIS — E8770 Fluid overload, unspecified: Secondary | ICD-10-CM | POA: Diagnosis present

## 2016-08-19 DIAGNOSIS — Z4781 Encounter for orthopedic aftercare following surgical amputation: Secondary | ICD-10-CM | POA: Diagnosis present

## 2016-08-19 DIAGNOSIS — I5030 Unspecified diastolic (congestive) heart failure: Secondary | ICD-10-CM

## 2016-08-19 DIAGNOSIS — I739 Peripheral vascular disease, unspecified: Secondary | ICD-10-CM

## 2016-08-19 DIAGNOSIS — F411 Generalized anxiety disorder: Secondary | ICD-10-CM

## 2016-08-19 LAB — GLUCOSE, CAPILLARY
GLUCOSE-CAPILLARY: 415 mg/dL — AB (ref 65–99)
GLUCOSE-CAPILLARY: 105 mg/dL — AB (ref 65–99)
Glucose-Capillary: 307 mg/dL — ABNORMAL HIGH (ref 65–99)
Glucose-Capillary: 264 mg/dL — ABNORMAL HIGH (ref 65–99)

## 2016-08-19 MED ORDER — INSULIN ASPART 100 UNIT/ML ~~LOC~~ SOLN: 0.0000 [IU] | Freq: Three times a day (TID) | SUBCUTANEOUS | 11 refills | Status: DC

## 2016-08-19 MED ORDER — PROCHLORPERAZINE 25 MG RE SUPP
12.5000 mg | Freq: Four times a day (QID) | RECTAL | Status: DC | PRN
  Filled 2016-08-19: qty 1

## 2016-08-19 MED ORDER — BISACODYL 10 MG RE SUPP
10.0000 mg | Freq: Every day | RECTAL | Status: DC | PRN
  Filled 2016-08-19: qty 1

## 2016-08-19 MED ORDER — DIPHENHYDRAMINE HCL 12.5 MG/5ML PO ELIX: 12.5000 mg | ORAL_SOLUTION | Freq: Four times a day (QID) | ORAL | Status: DC | PRN

## 2016-08-19 MED ORDER — HYDROCODONE-ACETAMINOPHEN 5-325 MG PO TABS: 2.0000 | ORAL_TABLET | ORAL | 0 refills | Status: DC

## 2016-08-19 MED ORDER — ADULT MULTIVITAMIN W/MINERALS CH: 1.0000 | ORAL_TABLET | Freq: Every day | ORAL | Status: DC

## 2016-08-19 MED ORDER — ENOXAPARIN SODIUM 40 MG/0.4ML ~~LOC~~ SOLN
40.0000 mg | SUBCUTANEOUS | Status: DC
  Administered 2016-08-20 – 2016-08-31 (×12): 40 mg via SUBCUTANEOUS
  Filled 2016-08-19 (×12): qty 0.4

## 2016-08-19 MED ORDER — CLOPIDOGREL BISULFATE 75 MG PO TABS
75.0000 mg | ORAL_TABLET | Freq: Every day | ORAL | Status: DC
  Administered 2016-08-20 – 2016-09-01 (×13): 75 mg via ORAL
  Filled 2016-08-19 (×13): qty 1

## 2016-08-19 MED ORDER — HYDROCHLOROTHIAZIDE 25 MG PO TABS
25.0000 mg | ORAL_TABLET | Freq: Every day | ORAL | Status: DC
  Administered 2016-08-19: 25 mg via ORAL
  Filled 2016-08-19: qty 1

## 2016-08-19 MED ORDER — PROCHLORPERAZINE MALEATE 5 MG PO TABS: 5.0000 mg | ORAL_TABLET | Freq: Four times a day (QID) | ORAL | Status: DC | PRN

## 2016-08-19 MED ORDER — CITALOPRAM HYDROBROMIDE 20 MG PO TABS
20.0000 mg | ORAL_TABLET | Freq: Every day | ORAL | Status: DC
  Administered 2016-08-20 – 2016-08-26 (×7): 20 mg via ORAL
  Filled 2016-08-19 (×7): qty 1

## 2016-08-19 MED ORDER — ADULT MULTIVITAMIN W/MINERALS CH: 1.0000 | ORAL_TABLET | Freq: Every day | ORAL | 0 refills | Status: DC

## 2016-08-19 MED ORDER — GUAIFENESIN-DM 100-10 MG/5ML PO SYRP: 15.0000 mL | ORAL_SOLUTION | ORAL | 0 refills | Status: DC | PRN

## 2016-08-19 MED ORDER — ALBUTEROL SULFATE (2.5 MG/3ML) 0.083% IN NEBU
2.5000 mg | INHALATION_SOLUTION | RESPIRATORY_TRACT | Status: DC | PRN
  Filled 2016-08-19: qty 3

## 2016-08-19 MED ORDER — ATORVASTATIN CALCIUM 40 MG PO TABS
40.0000 mg | ORAL_TABLET | Freq: Every day | ORAL | Status: DC
  Administered 2016-08-19 – 2016-08-31 (×13): 40 mg via ORAL
  Filled 2016-08-19 (×13): qty 1

## 2016-08-19 MED ORDER — HYDROCHLOROTHIAZIDE 25 MG PO TABS
25.0000 mg | ORAL_TABLET | Freq: Every day | ORAL | Status: DC
  Administered 2016-08-20: 25 mg via ORAL
  Filled 2016-08-19: qty 1

## 2016-08-19 MED ORDER — INSULIN ASPART 100 UNIT/ML ~~LOC~~ SOLN
0.0000 [IU] | Freq: Three times a day (TID) | SUBCUTANEOUS | Status: DC
  Administered 2016-08-19: 11 [IU] via SUBCUTANEOUS
  Administered 2016-08-20: 7 [IU] via SUBCUTANEOUS
  Administered 2016-08-21: 11 [IU] via SUBCUTANEOUS
  Administered 2016-08-21: 7 [IU] via SUBCUTANEOUS
  Administered 2016-08-22: 11 [IU] via SUBCUTANEOUS
  Administered 2016-08-22 – 2016-08-23 (×2): 7 [IU] via SUBCUTANEOUS
  Administered 2016-08-23: 3 [IU] via SUBCUTANEOUS
  Administered 2016-08-24: 11 [IU] via SUBCUTANEOUS
  Administered 2016-08-24: 3 [IU] via SUBCUTANEOUS
  Administered 2016-08-24: 11 [IU] via SUBCUTANEOUS
  Administered 2016-08-25: 4 [IU] via SUBCUTANEOUS
  Administered 2016-08-25: 7 [IU] via SUBCUTANEOUS
  Administered 2016-08-25 – 2016-08-26 (×2): 11 [IU] via SUBCUTANEOUS
  Administered 2016-08-26 (×2): 3 [IU] via SUBCUTANEOUS
  Administered 2016-08-27: 15 [IU] via SUBCUTANEOUS
  Administered 2016-08-27: 3 [IU] via SUBCUTANEOUS
  Administered 2016-08-28 (×2): 7 [IU] via SUBCUTANEOUS
  Administered 2016-08-29: 3 [IU] via SUBCUTANEOUS
  Administered 2016-08-29: 4 [IU] via SUBCUTANEOUS
  Administered 2016-08-30: 3 [IU] via SUBCUTANEOUS

## 2016-08-19 MED ORDER — PANTOPRAZOLE SODIUM 40 MG PO TBEC
40.0000 mg | DELAYED_RELEASE_TABLET | Freq: Every day | ORAL | Status: DC
  Administered 2016-08-20 – 2016-09-01 (×13): 40 mg via ORAL
  Filled 2016-08-19 (×13): qty 1

## 2016-08-19 MED ORDER — INSULIN GLARGINE 100 UNIT/ML ~~LOC~~ SOLN
16.0000 [IU] | Freq: Every day | SUBCUTANEOUS | Status: DC
  Administered 2016-08-19 – 2016-08-25 (×6): 16 [IU] via SUBCUTANEOUS
  Filled 2016-08-19 (×7): qty 0.16

## 2016-08-19 MED ORDER — POTASSIUM CHLORIDE CRYS ER 20 MEQ PO TBCR
20.0000 meq | EXTENDED_RELEASE_TABLET | Freq: Every day | ORAL | Status: DC
  Administered 2016-08-19 – 2016-08-21 (×3): 20 meq via ORAL
  Filled 2016-08-19 (×4): qty 1

## 2016-08-19 MED ORDER — ASPIRIN EC 81 MG PO TBEC
81.0000 mg | DELAYED_RELEASE_TABLET | Freq: Every day | ORAL | Status: DC
  Administered 2016-08-20 – 2016-09-01 (×13): 81 mg via ORAL
  Filled 2016-08-19 (×13): qty 1

## 2016-08-19 MED ORDER — PROCHLORPERAZINE EDISYLATE 5 MG/ML IJ SOLN: 5.0000 mg | Freq: Four times a day (QID) | INTRAMUSCULAR | Status: DC | PRN

## 2016-08-19 MED ORDER — TAB-A-VITE/IRON PO TABS
1.0000 | ORAL_TABLET | Freq: Every day | ORAL | Status: DC
  Administered 2016-08-20 – 2016-09-01 (×13): 1 via ORAL
  Filled 2016-08-19 (×13): qty 1

## 2016-08-19 MED ORDER — INSULIN GLARGINE 100 UNIT/ML ~~LOC~~ SOLN: 16.0000 [IU] | Freq: Every day | SUBCUTANEOUS | 11 refills | Status: DC

## 2016-08-19 MED ORDER — TRAZODONE HCL 50 MG PO TABS
25.0000 mg | ORAL_TABLET | Freq: Every evening | ORAL | Status: DC | PRN
  Administered 2016-08-21 – 2016-08-24 (×2): 50 mg via ORAL
  Filled 2016-08-19 (×2): qty 1

## 2016-08-19 MED ORDER — SENNOSIDES-DOCUSATE SODIUM 8.6-50 MG PO TABS
1.0000 | ORAL_TABLET | Freq: Every day | ORAL | Status: DC
Start: 2016-08-19 — End: 2016-09-01
  Administered 2016-08-19 – 2016-08-31 (×13): 1 via ORAL
  Filled 2016-08-19 (×13): qty 1

## 2016-08-19 MED ORDER — CYCLOBENZAPRINE HCL 5 MG PO TABS
5.0000 mg | ORAL_TABLET | Freq: Three times a day (TID) | ORAL | Status: DC | PRN
  Administered 2016-08-21 – 2016-08-31 (×3): 5 mg via ORAL
  Filled 2016-08-19 (×4): qty 1

## 2016-08-19 MED ORDER — INSULIN ASPART 100 UNIT/ML ~~LOC~~ SOLN
6.0000 [IU] | Freq: Three times a day (TID) | SUBCUTANEOUS | Status: DC
  Administered 2016-08-19 – 2016-08-30 (×22): 6 [IU] via SUBCUTANEOUS

## 2016-08-19 MED ORDER — PRO-STAT SUGAR FREE PO LIQD
30.0000 mL | Freq: Two times a day (BID) | ORAL | Status: DC
  Administered 2016-08-19 – 2016-09-01 (×26): 30 mL via ORAL
  Filled 2016-08-19 (×28): qty 30

## 2016-08-19 MED ORDER — AMLODIPINE BESYLATE 10 MG PO TABS
10.0000 mg | ORAL_TABLET | Freq: Every day | ORAL | Status: DC
  Administered 2016-08-20 – 2016-09-01 (×13): 10 mg via ORAL
  Filled 2016-08-19 (×13): qty 1

## 2016-08-19 MED ORDER — ALUM & MAG HYDROXIDE-SIMETH 200-200-20 MG/5ML PO SUSP
30.0000 mL | ORAL | Status: DC | PRN
  Administered 2016-08-21: 30 mL via ORAL
  Filled 2016-08-19: qty 30

## 2016-08-19 MED ORDER — DOCUSATE SODIUM 100 MG PO CAPS
100.0000 mg | ORAL_CAPSULE | Freq: Every day | ORAL | Status: DC
  Administered 2016-08-20 – 2016-08-26 (×7): 100 mg via ORAL
  Filled 2016-08-19 (×7): qty 1

## 2016-08-19 MED ORDER — GUAIFENESIN-DM 100-10 MG/5ML PO SYRP
5.0000 mL | ORAL_SOLUTION | Freq: Four times a day (QID) | ORAL | Status: DC | PRN
  Administered 2016-08-19: 10 mL via ORAL
  Administered 2016-08-20: 5 mL via ORAL
  Filled 2016-08-19: qty 10

## 2016-08-19 MED ORDER — OXYCODONE HCL 5 MG PO TABS: 5.0000 mg | ORAL_TABLET | ORAL | 0 refills | Status: DC | PRN

## 2016-08-19 MED ORDER — TRAMADOL HCL 50 MG PO TABS
50.0000 mg | ORAL_TABLET | Freq: Four times a day (QID) | ORAL | Status: DC | PRN
  Administered 2016-08-20 – 2016-08-26 (×13): 50 mg via ORAL
  Filled 2016-08-19 (×15): qty 1

## 2016-08-19 MED ORDER — ACETAMINOPHEN 325 MG PO TABS
650.0000 mg | ORAL_TABLET | Freq: Three times a day (TID) | ORAL | Status: DC
  Administered 2016-08-19 – 2016-08-26 (×28): 650 mg via ORAL
  Filled 2016-08-19 (×28): qty 2

## 2016-08-19 MED ORDER — POLYETHYLENE GLYCOL 3350 17 G PO PACK
17.0000 g | PACK | Freq: Every day | ORAL | Status: DC | PRN
  Administered 2016-08-23 – 2016-08-24 (×2): 17 g via ORAL
  Filled 2016-08-19 (×2): qty 1

## 2016-08-19 MED ORDER — FLEET ENEMA 7-19 GM/118ML RE ENEM: 1.0000 | ENEMA | Freq: Once | RECTAL | Status: DC | PRN

## 2016-08-19 NOTE — Social Work (Signed)
CSW received a phone call from [facility] inpatient rehab [facility] advising that they are considering CIR for patient. Pamala Hurry will f/u with family on placement.  CSW called Frannie at [facility] to discuss placement and left message for follow-up.   CSW spoke with Dr. Hetty Ely to sign 30 day note for SNF placement. CSW left letter on chart in drawer near patients room.  CSW will continue to f/u.

## 2016-08-19 NOTE — Progress Notes (Signed)
Inpatient Diabetes Program Recommendations  AACE/ADA: New Consensus Statement on Inpatient Glycemic Control (2015)  Target Ranges:  Prepandial:   less than 140 mg/dL      Peak postprandial:   less than 180 mg/dL (1-2 hours)      Critically ill patients:  140 - 180 mg/dL  Results for [facility] (MRN [id number]) as of 08/19/2016 09:11  Ref. Range 08/18/2016 07:10 08/18/2016 11:54 08/18/2016 16:36 08/18/2016 21:56 08/19/2016 07:00  Glucose-Capillary Latest Ref Range: 65 - 99 mg/dL 112 (H) 161 (H) 79 169 (H) 415 (H)    Review of Glycemic Control  Current orders for Inpatient glycemic control: Lantus 16 units QHS, Novolog 0-20 units TID, Novolog 6 units TID with meals  Inpatient Diabetes Program Recommendations:  Insulin - Basal: Noted patient received a one time dose of Decadron 10 mg on 08/18/16 at 22:05 which is likely cause of hyperglycemia (fasting 415 mg/dl this morning).  Please consider slightly increasing Lantus to 18 units QHS.  Thanks, Barnie Alderman, RN, MSN, CDE Diabetes Coordinator Inpatient Diabetes Program [phone] (Team Pager from 8am to 5pm)

## 2016-08-19 NOTE — Discharge Summary (Signed)
Name: Kimberly Lane MRN: [id number] DOB: Jul 22, 1960 56 y.o. PCP: Angelica Pou, MD  Date of Admission: 08/08/2016  2:28 PM Date of Discharge: 08/19/2016 Attending Physician: Lucious Groves, DO  Discharge Diagnosis: 1.    Gangrene due to peripheral vascular disease (HCC)   DM (diabetes mellitus), type 2, uncontrolled (HCC)   S/P BKA (below knee amputation) unilateral, right ([place])   Osteomyelitis of left foot ([place])   PAD (peripheral artery disease) ([place])   S/P AKA (above knee amputation), left ([place])   Acute respiratory failure ([place])   Discharge Medications: Allergies as of 08/19/2016      Reactions   Bactrim [sulfamethoxazole-trimethoprim] Nausea And Vomiting, Other (See Comments)   Pt and family verified that pt takes this medication   Tuberculin Ppd Other (See Comments)   Unknown   Ace Inhibitors Cough   cough      Medication List    STOP taking these medications   losartan 50 MG tablet Commonly known as:  COZAAR     TAKE these medications   acetaminophen 650 MG CR tablet Commonly known as:  TYLENOL Take 650 mg by mouth every 8 (eight) hours as needed for pain.   amLODipine-atorvastatin 10-40 MG tablet Commonly known as:  CADUET Take 1 tablet by mouth daily.   aspirin 81 MG tablet Take 1 tablet (81 mg total) by mouth daily.   citalopram 20 MG tablet Commonly known as:  CELEXA Take 20 mg by mouth daily.   clopidogrel 75 MG tablet Commonly known as:  PLAVIX Take 1 tablet (75 mg total) by mouth daily with breakfast.   glipiZIDE 5 MG 24 hr tablet Commonly known as:  GLUCOTROL XL Take 7.5 mg by mouth daily with breakfast.   guaiFENesin-dextromethorphan 100-10 MG/5ML syrup Commonly known as:  ROBITUSSIN DM Take 15 mLs by mouth every 4 (four) hours as needed for cough.   hydrochlorothiazide 25 MG tablet Commonly known as:  HYDRODIURIL Take 1 tablet (25 mg total) by mouth daily.   HYDROcodone-acetaminophen 5-325 MG tablet Commonly known as:   NORCO/VICODIN Take 2 tablets by mouth every 4 (four) hours.   metFORMIN 500 MG 24 hr tablet Commonly known as:  GLUCOPHAGE-XR Take 1,000 mg by mouth daily with breakfast.   multivitamin with minerals Tabs tablet Take 1 tablet by mouth daily.   oxyCODONE 5 MG immediate release tablet Commonly known as:  Oxy IR/ROXICODONE Take 1 tablet (5 mg total) by mouth every 3 (three) hours as needed for breakthrough pain.       Disposition and follow-up:   Kimberly Lane was discharged from [facility] in Good condition.  At the hospital follow up visit please address:  1.  Type 2 DM, this was uncontrolled on presentation but then improved after infected limb was removed, please continue CBG monitoring and insulin ( she responded well to Lantus 16 units qHS and 6-10 units of novolog with meals)   Diastolic heart failure- she required IV lasix during this admission then home med HCTZ was restarted at discharge. Please continue to monitor for signs of fluid overload.   Left AKA - reinforce follow up with Dr. Donzetta Matters in 2-3 weeks for staple removal.   2.  Labs / imaging needed at time of follow-up: BMP, CBC   3.  Pending labs/ test needing follow-up: none   Follow-up Appointments: Follow-up Information    Waynetta Sandy, MD Follow up in 3 week(s).   Specialties:  Vascular Surgery, Cardiology Why:  Office  will call you to arrange your appt (sent) Contact information: [place] [address] [phone]        Angelica Pou, MD. Schedule an appointment as soon as possible for a visit in 2 week(s).   Specialty:  Internal Medicine Contact information: [address] [place] [address] [place] Hospital Course by problem list:    PAD (peripheral artery disease) ([place]) w left SFA stent    S/P BKA (below knee amputation) unilateral, right (HCC)   Gangrene due to peripheral vascular disease ([place])   Osteomyelitis of left  foot ([place])  S/P AKA (above knee amputation), left ([place]) Kimberly Lane is a 56 yo [place]ese woman and [facility] patient with PMH HTN, uncontrolled DM, HLD, PAD for which she had a right BKA and left femoral artery stent, who presented to her PCP with worsening left first toe gangrene. This February she had been offered first toe amputation by vascular surgery but she declined this intervention. At her visit with her PCP she was agreeable to amputation so she was transferred to [facility]. On admission she was started on broad spectrum antibiotics then vascular surgery took her for amputation and the first toe was amputated. During the operation they found pus extending past her first toe and recommended further surgery, her popliteal artery pressure was poor so they recommended AKA. Kimberly Lane was initially resistant to this but with explanation of the risk of going without surgery she agreed. Her hospital course was complicated when she developed pulmonary edema 2/2 IV fluids in the setting of unknown grade 1 diastolic CHF. She was diuresed then had an uncomplicated AKA on 57/98. PT recommended CIR and the patient and her husband were in agreement with this.     Essential hypertension Blood pressure remained well controlled on Amlodipine 10 mg daily and HCTZ 25 mg daily, original home med losartan 50 mg daily held at discharge.     DM (diabetes mellitus), type 2, uncontrolled ([place]) Blood sugar was initially difficult to control prior to the AKA and removal of her necrotic limb however after the surgery her glucose was well maintained on Lantus 16 units daily and about 6-10 units of sliding scale.     Acute respiratory failure ([place]) She developed acute pulmonary edema with perioperative fluids which responded well to IV lasix then needed to stay intubated for an additional day after the AKA. Echo revealed grade 1 diastolic dysfunction and LVH.    Discharge Vitals:   BP (!) 151/69 (BP Location: Right Arm)   Pulse 72    Temp 98.6 F (37 C) (Oral)   Resp 16   Ht 5\' 2"  (1.575 m)   Wt 142 lb 13.7 oz (64.8 kg)   SpO2 93%   BMI 26.13 kg/m   Pertinent Labs, Studies, and Procedures:   Echo 6/28 Study Conclusions  - Left ventricle: The cavity size was normal. Wall thickness was   increased in a pattern of moderate LVH. Systolic function was   normal. The estimated ejection fraction was in the range of 60%   to 65%. Doppler parameters are consistent with abnormal left   ventricular relaxation (grade 1 diastolic dysfunction).  Discharge Instructions: Discharge Instructions    Call MD for:  difficulty breathing, headache or visual disturbances    Complete by:  As directed    Call MD for:  persistant nausea and vomiting    Complete by:  As directed  Call MD for:  temperature >100.4    Complete by:  As directed    Diet - low sodium heart healthy    Complete by:  As directed    Increase activity slowly    Complete by:  As directed       Signed: Ledell Noss, MD 08/19/2016, 11:58 AM   Pager: [phone]

## 2016-08-19 NOTE — Progress Notes (Signed)
I will make the arrangements to admit pt to inpt rehab today. Pt is in agreement [phone].

## 2016-08-19 NOTE — Progress Notes (Signed)
Physical Therapy Treatment Patient Details Name: Kimberly Lane MRN: [id number] DOB: [birth year] Today's Date: 08/19/2016    History of Present Illness This 56 y.o. female admitted 08/08/16 with Lt toe osteomeylitis.  She underwent Lt AKA 08/15/16.  PMH includes:  Rt BKA, anemia, anxiety, cellulitis and abecess of foot, DM, HTN, +TB test (2013)    PT Comments    Pt performed increased mobility during session.  Pt tolerating advancing via lateral scoot to recliner chair.  Pt reports increased pain. CP applied to LLE and RN informed and brought pain meds.  Pt resting in recliner post tx.  Used interpreter for session.  Pt to d/c to CIR today for continued intensive therapy.     Follow Up Recommendations  CIR;Supervision/Assistance - 24 hour     Equipment Recommendations   (TBD)    Recommendations for Other Services       Precautions / Restrictions Precautions Precautions: Fall Restrictions Weight Bearing Restrictions: Yes RLE Weight Bearing: Non weight bearing LLE Weight Bearing: Non weight bearing    Mobility  Bed Mobility Overal bed mobility: Needs Assistance Bed Mobility: Supine to Sit;Sit to Supine     Supine to sit: Mod assist;+2 for physical assistance     General bed mobility comments: Pt able to follow commands to reach for railing and elevate trunk into sitting.  Pt performed with assist to move hips to edge of bed and to elevate trunk into sitting.    Transfers Overall transfer level: Needs assistance Equipment used: None (used chux pad to scoot to drop arm recliner.  ) Transfers: Lateral/Scoot Transfers          Lateral/Scoot Transfers: Max assist;+2 physical assistance General transfer comment: Pt performed lateral scoot to the R.  PTA and PT scooted patient to recliner chair.  Pt able to use B UEs to push and to reach for chair arm rest to assist in scooting.    Ambulation/Gait                 Stairs            Wheelchair Mobility     Modified Rankin (Stroke Patients Only)       Balance     Sitting balance-Leahy Scale: Poor Sitting balance - Comments: requires UE support and intermittent min A.  Postural control: Posterior lean                                  Cognition Arousal/Alertness: Awake/alert Behavior During Therapy: WFL for tasks assessed/performed Overall Cognitive Status: Within Functional Limits for tasks assessed                                        Exercises Amputee Exercises Hip ABduction/ADduction: AROM;Right;Left;10 reps;Seated Knee Extension: AROM;Right;5 reps;Seated Straight Leg Raises: AROM;Right;5 reps;Left;Seated Chair Push Up: AROM;Both;Seated;5 reps    General Comments        Pertinent Vitals/Pain Pain Assessment: Faces Faces Pain Scale: Hurts even more Pain Location: Lt residual limb  Pain Descriptors / Indicators: Grimacing;Guarding Pain Intervention(s): Monitored during session;Repositioned;Ice applied    Home Living     Available Help at Discharge: Family;Available PRN/intermittently (spouse works a second shift job; visits every morning in [place]) Type of Home: [place]: Two level   Additional Comments: Pt reports she sleeps on  a mattress on the floor as her bed is too high to transfer into.  She indicates she is able to maneuver w/c throughout her apt and into the bathroom.  She states there is only one area she cannot access via w/c     Prior Function            PT Goals (current goals can now be found in the care plan section) Acute Rehab PT Goals Patient Stated Goal: Pt did not state  Potential to Achieve Goals: Good Progress towards PT goals: Progressing toward goals    Frequency    Min 3X/week      PT Plan Current plan remains appropriate    Co-evaluation              AM-PAC PT "6 Clicks" Daily Activity  Outcome Measure  Difficulty turning over in bed (including adjusting bedclothes,  sheets and blankets)?: Total Difficulty moving from lying on back to sitting on the side of the bed? : Total Difficulty sitting down on and standing up from a chair with arms (e.g., wheelchair, bedside commode, etc,.)?: Total Help needed moving to and from a bed to chair (including a wheelchair)?: A Lot Help needed walking in hospital room?: Total (n/a) Help needed climbing 3-5 steps with a railing? : Total (n/a) 6 Click Score: 7    End of Session Equipment Utilized During Treatment:  (chux pad.  ) Activity Tolerance: Patient tolerated treatment well (pt reports increased pain and requesting pain meds.  ) Patient left: with call bell/phone within reach;with nursing/sitter in room;in chair;with chair alarm set Nurse Communication: Mobility status (informed RN of need for pain meds and informed nurse tech to replace pure wick catheter.  ) PT Visit Diagnosis: Pain;Other (comment) Pain - Right/Left: Left Pain - part of body:  (residual limb post aka)     Time: 0712-1975 PT Time Calculation (min) (ACUTE ONLY): 39 min  Charges:  $Therapeutic Exercise: 8-22 mins $Therapeutic Activity: 23-37 mins                    G CodesGovernor Rooks, PTA pager [phone]    Cristela Blue 08/19/2016, 1:44 PM

## 2016-08-19 NOTE — H&P (Addendum)
Physical Medicine and Rehabilitation Admission H&P    Chief Complaint  Patient presents with  . L-AKA    History of R-BKA    HPI: Kimberly Lane is a 56 year old [name] woman with uncontrolled T2DM, HTN, PAD s/p R-BKA, PVD  And L-SFA stent and progressive necrotic changes of right great toe with osteomyelitis. Wound cultures positive for few citrobacter freundii and she was treated with ceftriaxone and metronidazole. Hospital course complicate by volume overload 6/23 requiring intubation and aggressive diuresis. 2 D echo with EF 60-65%, moderate LVH with grade 1 diastolic dysfunction Surgery delayed till stable and she underwent L-AKA on 6/25. She was extubated on 6/26 and respiratory status stable. Therapy initiated and CIR recommended due to decline in ability to carry out ADL tasks as well as mobility.     Review of Systems  HENT: Negative for hearing loss and tinnitus.   Eyes: Negative for blurred vision.  Respiratory: Negative for cough and shortness of breath.   Cardiovascular: Positive for chest pain (pointing to upper chest and substernal area).  Gastrointestinal: Positive for abdominal pain, constipation (no bm since admission) and nausea.  Genitourinary: Positive for dysuria and frequency.  Musculoskeletal: Positive for myalgias.  Skin: Negative for rash.  Neurological: Positive for dizziness (and double vision at times), weakness and headaches.  Psychiatric/Behavioral: The patient is nervous/anxious.   All other systems reviewed and are negative.     Past Medical History:  Diagnosis Date  . Allergy   . Anemia   . Anxiety   . Cellulitis and abscess of foot 05/13/2014   rt foot  . Diabetes mellitus   . Hypertension   . Positive TB test 2013    Past Surgical History:  Procedure Laterality Date  . ABDOMINAL AORTAGRAM N/A 05/20/2014   Procedure: ABDOMINAL Maxcine Ham;  Surgeon: Serafina Mitchell, MD;  Location: [facility] CATH LAB;  Service: Cardiovascular;  Laterality: N/A;    . AMPUTATION Right 05/23/2014   Procedure: AMPUTATION BELOW KNEE;  Surgeon: Mal Misty, MD;  Location: [place];  Service: Vascular;  Laterality: Right;  . AMPUTATION Left 08/09/2016   Procedure: LEFT GREAT TOE AMPUTATION;  Surgeon: Waynetta Sandy, MD;  Location: [place];  Service: Vascular;  Laterality: Left;  . AMPUTATION Left 08/15/2016   Procedure: AMPUTATION ABOVE KNEE;  Surgeon: Rosetta Posner, MD;  Location: [place];  Service: Vascular;  Laterality: Left;  . PERIPHERAL VASCULAR CATHETERIZATION N/A 03/11/2016   Procedure: Abdominal Aortogram w/Lower Extremity;  Surgeon: Elam Dutch, MD;  Location: [place] CV LAB;  Service: Cardiovascular;  Laterality: N/A;  . PERIPHERAL VASCULAR CATHETERIZATION Left 03/11/2016   Procedure: Peripheral Vascular Intervention;  Surgeon: Elam Dutch, MD;  Location: [place] CV LAB;  Service: Cardiovascular;  Laterality: Left;  Superficial femoral    Family History  Problem Relation Age of Onset  . Heart disease Unknown        No family history    Social History:  Married. Independent  reports that she has never smoked. She has never used smokeless tobacco. She reports that she does not drink alcohol or use drugs.    Allergies  Allergen Reactions  . Bactrim [Sulfamethoxazole-Trimethoprim] Nausea And Vomiting and Other (See Comments)    Pt and family verified that pt takes this medication  . Tuberculin Ppd Other (See Comments)    Unknown  . Ace Inhibitors Cough    cough    Medications Prior to Admission  Medication Sig Dispense Refill  . acetaminophen (  TYLENOL) 650 MG CR tablet Take 650 mg by mouth every 8 (eight) hours as needed for pain.    Marland Kitchen amLODipine-atorvastatin (CADUET) 10-40 MG tablet Take 1 tablet by mouth daily.    Marland Kitchen aspirin 81 MG tablet Take 1 tablet (81 mg total) by mouth daily. 100 tablet 0  . citalopram (CELEXA) 20 MG tablet Take 20 mg by mouth daily.    . clopidogrel (PLAVIX) 75 MG tablet Take 1 tablet (75 mg  total) by mouth daily with breakfast. 30 tablet 11  . glipiZIDE (GLUCOTROL XL) 5 MG 24 hr tablet Take 7.5 mg by mouth daily with breakfast.     . hydrochlorothiazide (HYDRODIURIL) 25 MG tablet Take 1 tablet (25 mg total) by mouth daily. 90 tablet 3  . losartan (COZAAR) 50 MG tablet Take 50 mg by mouth daily.    . metFORMIN (GLUCOPHAGE-XR) 500 MG 24 hr tablet Take 1,000 mg by mouth daily with breakfast.      Home: Home Living Family/patient expects to be discharged to:: Private residence Living Arrangements: Spouse/significant other Available Help at Discharge: (P) Family, Available PRN/intermittently (spouse works a second shift job; visits every morning in [place]) Type of Home: (P) Apartment Home Access: Level entry Home Layout: (P) Two level Bathroom Shower/Tub: (P) Tub/shower unit, Architectural technologist: (P) Standard Home Equipment: Wheelchair - manual Additional Comments: (P) Pt reports she sleeps on a mattress on the floor as her bed is too high to transfer into.  She indicates she is able to maneuver w/c throughout her apt and into the bathroom.  She states there is only one area she cannot access via w/c   Lives With: (P) Spouse   Functional History: Prior Function Level of Independence: Needs assistance Gait / Transfers Assistance Needed: uses w/c for mobility at home  ADL's / Homemaking Assistance Needed: Pt requires assist  Comments: Pt is from the [place]   Functional Status:  Mobility: Bed Mobility Overal bed mobility: Needs Assistance Bed Mobility: Supine to Sit, Sit to Supine Supine to sit: Total assist, +2 for physical assistance Sit to supine: Total assist, +2 for physical assistance General bed mobility comments: Pt able to assist minimally with lifting and lowering trunk from/to bed.  She requires assist with all other aspects.  Pt pushes posteiorly in response to pain  Transfers General transfer comment: unable to attempt due to orthostasis        ADL: ADL Overall ADL's : Needs assistance/impaired Eating/Feeding: Independent Grooming: Wash/dry hands, Wash/dry face, Oral care, Brushing hair, Set up, Sitting Upper Body Bathing: Minimal assistance, Bed level Lower Body Bathing: Maximal assistance, Bed level Upper Body Dressing : Moderate assistance, Sitting, Bed level Lower Body Dressing: Total assistance, Bed level Toilet Transfer: Total assistance Toilet Transfer Details (indicate cue type and reason): unable to attempt  Toileting- Clothing Manipulation and Hygiene: Total assistance, Bed level Functional mobility during ADLs: +2 for physical assistance, Total assistance  Cognition: Cognition Overall Cognitive Status: Within Functional Limits for tasks assessed Orientation Level: Oriented X4 Cognition Arousal/Alertness: Awake/alert Behavior During Therapy: WFL for tasks assessed/performed Overall Cognitive Status: Within Functional Limits for tasks assessed   Blood pressure (!) 151/69, pulse 72, temperature 98.6 F (37 C), temperature source Oral, resp. rate 16, height '5\' 2"'  (1.575 m), weight 64.8 kg (142 lb 13.7 oz), SpO2 93 %. Physical Exam  Nursing note and vitals reviewed. Constitutional: She appears well-developed and well-nourished.  HENT:  Head: Normocephalic and atraumatic.  Mouth/Throat: Oropharynx is clear and moist.  Eyes: Conjunctivae  and EOM are normal. Pupils are equal, round, and reactive to light.  Neck: Normal range of motion. Neck supple.  Cardiovascular: Normal rate and regular rhythm.   Respiratory: Effort normal and breath sounds normal. No stridor.  GI: Soft. Bowel sounds are normal. She exhibits no distension. There is no tenderness.  Musculoskeletal: She exhibits edema and tenderness.  L-AKA with staples in place. Old R-BKA well healed.   Neurological: She is alert.  Appears disoriented and had a hard time understanding and answering remote interpreter.  She is able to follow simple motor  commands.    Motor:B/l UE 4+/5 proximal to distal RLE: HF, KE 4+/5 LLE: HF 4/5 (pain inhibition)  Skin: Skin is warm and dry.  Psychiatric: Her mood appears anxious. She is slowed. Cognition and memory are impaired.    Results for orders placed or performed during the hospital encounter of 08/08/16 (from the past 48 hour(s))  Glucose, capillary     Status: Abnormal   Collection Time: 08/17/16  1:02 PM  Result Value Ref Range   Glucose-Capillary 231 (H) 65 - 99 mg/dL   Comment 1 Capillary Specimen    Comment 2 Notify RN   Glucose, capillary     Status: None   Collection Time: 08/17/16  6:08 PM  Result Value Ref Range   Glucose-Capillary 66 65 - 99 mg/dL  Glucose, capillary     Status: Abnormal   Collection Time: 08/17/16  6:39 PM  Result Value Ref Range   Glucose-Capillary 111 (H) 65 - 99 mg/dL  Glucose, capillary     Status: Abnormal   Collection Time: 08/17/16  9:20 PM  Result Value Ref Range   Glucose-Capillary 114 (H) 65 - 99 mg/dL  Basic metabolic panel     Status: Abnormal   Collection Time: 08/18/16  5:58 AM  Result Value Ref Range   Sodium 141 135 - 145 mmol/L   Potassium 3.5 3.5 - 5.1 mmol/L   Chloride 101 101 - 111 mmol/L   CO2 31 22 - 32 mmol/L   Glucose, Bld 79 65 - 99 mg/dL   BUN 10 6 - 20 mg/dL   Creatinine, Ser 0.75 0.44 - 1.00 mg/dL   Calcium 8.2 (L) 8.9 - 10.3 mg/dL   GFR calc non Af Amer >60 >60 mL/min   GFR calc Af Amer >60 >60 mL/min    Comment: (NOTE) The eGFR has been calculated using the CKD EPI equation. This calculation has not been validated in all clinical situations. eGFR's persistently <60 mL/min signify possible Chronic Kidney Disease.    Anion gap 9 5 - 15  Glucose, capillary     Status: Abnormal   Collection Time: 08/18/16  7:10 AM  Result Value Ref Range   Glucose-Capillary 112 (H) 65 - 99 mg/dL  Glucose, capillary     Status: Abnormal   Collection Time: 08/18/16 11:54 AM  Result Value Ref Range   Glucose-Capillary 161 (H) 65 - 99  mg/dL  Glucose, capillary     Status: None   Collection Time: 08/18/16  4:36 PM  Result Value Ref Range   Glucose-Capillary 79 65 - 99 mg/dL   Comment 1 Notify RN   Glucose, capillary     Status: Abnormal   Collection Time: 08/18/16  9:56 PM  Result Value Ref Range   Glucose-Capillary 169 (H) 65 - 99 mg/dL  Glucose, capillary     Status: Abnormal   Collection Time: 08/19/16  7:00 AM  Result Value Ref Range  Glucose-Capillary 415 (H) 65 - 99 mg/dL  Glucose, capillary     Status: Abnormal   Collection Time: 08/19/16 11:41 AM  Result Value Ref Range   Glucose-Capillary 307 (H) 65 - 99 mg/dL   No results found.   Medical Problem List and Plan: 1.  Limitations in mobility, transfers, cognition, and self-care secondary to left AKA with history of right BKA.  2.  DVT Prophylaxis/Anticoagulation: Pharmaceutical: Lovenox 3. Pain Management: D/C scheduled hydrocodone. Will add tylenol qid with ultram or oxycodone prn  4. Mood: LCSW to follow for evaluation and support.  5. Neuropsych: This patient is capable of making decisions on her own behalf. 6. Skin/Wound Care: monitor wound daily for healing. Change Glucerna (may be contributing to BS elevation)  to prosource for low protein stores.  7. Fluids/Electrolytes/Nutrition: Monitor I/O. Check lytes in am.  8. T2DM uncontrolled: Hgb A1C- 11.4.  Receive decadron by mistake on 6/28 with elevated BS. Monitor BS for with ac/hs CBG checks and anticipate higher readings for 1-2 days. Will use SSI for elevated BS and titrate meds as indicated. Now on Lantus--will need extensive education.  9. HTN: Monitor BP bid  and titrate meds as indicated. Continue HCTZ, amlodipine and norvasc.  10. Acute fluid overload:  Lasix discontinued. Continue hctz with daily weight for monitoring.  11. Nausea/?GERD/poor po intake: On protonix. Will discontinue scheduled hydrocodone as may be contributing to symptoms.  Augment bowel program 12. Leucocytosis: Likely  reactive and resolving. Monitor for trend and signs of infection.   13. Chest pain/heaviness: Has been having for months and takes 'some medications".   14. Anxiety disorder: Multiple somatic complaints--monitor for now. Team to provide ego support.    Post Admission Physician Evaluation: 1. Preadmission assessment reviewed and changes made below. 2. Functional deficits secondary  to left AKA with history of right BKA.  3. Patient is admitted to receive collaborative, interdisciplinary care between the physiatrist, rehab nursing staff, and therapy team. 4. Patient's level of medical complexity and substantial therapy needs in context of that medical necessity cannot be provided at a lesser intensity of care such as a SNF. 5. Patient has experienced substantial functional loss from his/her baseline which was documented above under the "Functional History" and "Functional Status" headings.  Judging by the patient's diagnosis, physical exam, and functional history, the patient has potential for functional progress which will result in measurable gains while on inpatient rehab.  These gains will be of substantial and practical use upon discharge  in facilitating mobility and self-care at the household level. 37. Physiatrist will provide 24 hour management of medical needs as well as oversight of the therapy plan/treatment and provide guidance as appropriate regarding the interaction of the two. 7. 24 hour rehab nursing will assist with bowel management, safety, skin/wound care, disease management, pain management and patient education  and help integrate therapy concepts, techniques,education, etc. 8. PT will assess and treat for/with: Lower extremity strength, range of motion, stamina, balance, functional mobility, safety, adaptive techniques and equipment, woundcare, coping skills, pain control, pre-prosthetic education.   Goals are: Min A. 9. OT will assess and treat for/with: ADL's, functional  mobility, safety, upper extremity strength, adaptive techniques and equipment, wound mgt, ego support, and community reintegration.   Goals are: Min A. Therapy may not proceed with showering this patient. 10. SLP will assess and treat for/with: speech, language, cognition.  Goals are: Supervision. 11. Case Management and Social Worker will assess and treat for psychological issues and discharge planning. 12.  Team conference will be held weekly to assess progress toward goals and to determine barriers to discharge. 13. Patient will receive at least 3 hours of therapy per day at least 5 days per week. 14. ELOS: 12-17 days.       15. Prognosis:  good  Delice Lesch, MD, [address] [place] 08/19/2016

## 2016-08-19 NOTE — Social Work (Signed)
Patient admitted to CIR.   CSW spoke with [facility] at [facility] to discuss as she is the Education officer, museum following patient. Earnest Bailey will f/u with [facility] and advise of placement no longer needing SNF placement at this time.  CSW obtained passr#.  CSW will sign off for now as social work intervention is no longer needed. Please consult [place] again if new need arises.  Elissa Hefty, LCSW Clinical Social Worker [phone]

## 2016-08-19 NOTE — Progress Notes (Signed)
Kimberly Gong, RN Rehab Admission Coordinator Signed Physical Medicine and Rehabilitation  PMR Pre-admission Date of Service: 08/19/2016 12:25 PM  Related encounter: ED to Hosp-Admission (Current) from 08/08/2016 in [place]       [] Hide copied text PMR Admission Coordinator Pre-Admission Assessment  Patient: Kimberly Lane is an 56 y.o., female MRN: [id number] DOB: 03-Jul-1960 Height: 5\' 2"  (157.5 cm) Weight: 64.8 kg (142 lb 13.7 oz)                                                                                                                                                  Insurance Information HMO:    PPO:      PCP:      IPA:      80/20:      OTHER:  PRIMARY: [facility]      Policy#: [id number]      Subscriber: pt CM Name: Seth Bake or Holly/Hospital liaisons      Phone#: [phone]     Fax#: EPIC access Pre-Cert#: [id number]      Employer: [employer]'s cell is [phone] Benefits:  Phone #: Claudia Desanctis     Name:  Per [facility]  SECONDARY: Medicaid      Policy#: [id number]      Subscriber: pt  Medicaid Application Date:       Case Manager:  Disability Application Date:       Case Worker:   Emergency Contact Information        Contact Information    Name Relation Home Work Mobile   Abdulla,Juma Spouse [phone]  [phone]   Aron Baba [phone]  [phone]   Sarina Ser [phone]     Service,[place] [facility] Other [phone]       Current Medical History  Patient Admitting Diagnosis: new L AKA, history of R BKA  History of Present Illness: HPI: Kimberly Lane a 56 y.o.right handedlimited English-speaking fem[place] with history of hypertension, diabetes mellitus, anxiety and right BKA April 2016 and received inpatient rehabilitation services.  Patient primarily uses a wheelchair for mobility in the home.Presented 08/14/2016 with gangrenous left foot and history of left great toe amputation  08/09/2016. Poor healing with ongoing ischemic changes. Limb was not felt to be salvageable. Underwent left AKA 08/15/2016 per Dr. Donnetta Hutching. Hospital course pain management. Acute on chronic anemia 7.9and monitor. Leukocytosis 17,900. Subcutaneous Lovenox for DVT prophylaxis. Wound culture Citrobacter currently on broad-spectrum antibiotics. Slow to extubate with follow-up per pulmonary servicesand extubated 08/17/2016.  Past Medical History      Past Medical History:  Diagnosis Date  . Allergy   . Anemia   . Anxiety   . Cellulitis and abscess of foot 05/13/2014   rt foot  . Diabetes mellitus   . Hypertension   . 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  Prior Rehab/Hospitalizations:  Has the patient had major surgery during 100 days prior to admission? No   CIR 2016 and d/c home after first amputation. Went to [facility] 02/2016 before returning home  Current Medications   Current Facility-Administered Medications:  .  albuterol (PROVENTIL) (2.5 MG/3ML) 0.083% nebulizer solution 2.5 mg, 2.5 mg, Nebulization, Q4H PRN, Velna Ochs, MD, 2.5 mg at 08/18/16 1030 .  amLODipine (NORVASC) tablet 10 mg, 10 mg, Oral, Daily, Ledell Noss, MD, 10 mg at 08/19/16 1059 .  aspirin EC tablet 81 mg, 81 mg, Oral, Daily, Ledell Noss, MD, 81 mg at 08/19/16 1058 .  atorvastatin (LIPITOR) tablet 40 mg, 40 mg, Oral, q1800, Ledell Noss, MD, 40 mg at 08/18/16 1825 .  citalopram (CELEXA) tablet 20 mg, 20 mg, Oral, Daily, Velna Ochs, MD, 20 mg at 08/19/16 1059 .  clopidogrel (PLAVIX) tablet 75 mg, 75 mg, Oral, Daily, Ledell Noss, MD, 75 mg at 08/19/16 1059 .  docusate sodium (COLACE) capsule 100 mg, 100 mg, Oral, Daily, Rhyne, Samantha J, PA-C, 100 mg at 08/19/16 1058 .  enoxaparin (LOVENOX) injection 40 mg, 40 mg, Subcutaneous, Q24H, Rhyne, Samantha J, PA-C, 40 mg at 08/18/16 1336 .  feeding supplement (GLUCERNA SHAKE) (GLUCERNA SHAKE) liquid 237 mL, 237  mL, Oral, TID BM, Ledell Noss, MD, 237 mL at 08/19/16 1059 .  guaiFENesin-dextromethorphan (ROBITUSSIN DM) 100-10 MG/5ML syrup 15 mL, 15 mL, Oral, Q4H PRN, Rhyne, Samantha J, PA-C, 15 mL at 08/19/16 0159 .  hydrochlorothiazide (HYDRODIURIL) tablet 25 mg, 25 mg, Oral, Daily, Ledell Noss, MD, 25 mg at 08/19/16 1059 .  HYDROcodone-acetaminophen (NORCO/VICODIN) 5-325 MG per tablet 2 tablet, 2 tablet, Oral, Q4H, Ledell Noss, MD, 2 tablet at 08/19/16 1059 .  insulin aspart (novoLOG) injection 0-20 Units, 0-20 Units, Subcutaneous, TID WC, Collene Gobble, MD, 15 Units at 08/19/16 1212 .  insulin aspart (novoLOG) injection 6 Units, 6 Units, Subcutaneous, TID WC, Ledell Noss, MD, 6 Units at 08/19/16 1213 .  insulin glargine (LANTUS) injection 16 Units, 16 Units, Subcutaneous, QHS, Ledell Noss, MD, 16 Units at 08/18/16 2209 .  MEDLINE mouth rinse, 15 mL, Mouth Rinse, BID, Hoffman, Erik C, DO, 15 mL at 08/19/16 1059 .  multivitamin with minerals tablet 1 tablet, 1 tablet, Oral, Daily, Lucious Groves, DO, 1 tablet at 08/19/16 1059 .  ondansetron (ZOFRAN) injection 4-8 mg, 4-8 mg, Intravenous, Q6H PRN, 4 mg at 08/16/16 1514 **OR** [DISCONTINUED] ondansetron (ZOFRAN) 8 mg/NS 50 ml IVPB, 8 mg, Intravenous, Q6H PRN, Heber Oneonta, Erik C, DO .  oxyCODONE (Oxy IR/ROXICODONE) immediate release tablet 5 mg, 5 mg, Oral, Q3H PRN, Ledell Noss, MD .  pantoprazole (PROTONIX) EC tablet 40 mg, 40 mg, Oral, Daily, Ledell Noss, MD, 40 mg at 08/19/16 1059 .  senna-docusate (Senokot-S) tablet 1 tablet, 1 tablet, Oral, QHS, Ledell Noss, MD, 1 tablet at 08/18/16 2216  Patients Current Diet: Diet Carb Modified Fluid consistency: Thin; Room service appropriate? Yes Diet - low sodium heart healthy  Precautions / Restrictions Precautions Precautions: Fall Restrictions Weight Bearing Restrictions: Yes RLE Weight Bearing: Non weight bearing LLE Weight Bearing: Non weight bearing   Has the patient had 2 or more falls or a fall with injury  in the past year?No  Prior Activity Level Household: went via [employer] to [employer] and thurs 9 am until 1 pm  [place] / Equipment Home Equipment: Wheelchair - manual  Prior Device Use: Indicate devices/aids used by the patient prior to current  illness, exacerbation or injury? Manual wheelchair  Prior Functional Level Prior Function Level of Independence: Needs assistance Gait / Transfers Assistance Needed: uses w/c for mobility at home  ADL's / Homemaking Assistance Needed: Pt requires assist  Comments: Pt is from the [place]   Self Care: Did the patient need help bathing, dressing, using the toilet or eating?  Needed some help  Indoor Mobility: Did the patient need assistance with walking from room to room (with or without device)? Independent Mod I with wheelchair in home  Stairs: Did the patient need assistance with internal or external stairs (with or without device)? Needed some help  Functional Cognition: Did the patient need help planning regular tasks such as shopping or remembering to take medications? Needed some help  Current Functional Level Cognition  Overall Cognitive Status: Within Functional Limits for tasks assessed Orientation Level: Oriented X4    Extremity Assessment (includes Sensation/Coordination)  Upper Extremity Assessment: Generalized weakness  Lower Extremity Assessment: Generalized weakness, RLE deficits/detail, LLE deficits/detail LLE Deficits / Details: new left aka LLE: Unable to fully assess due to pain    ADLs  Overall ADL's : Needs assistance/impaired Eating/Feeding: Independent Grooming: Wash/dry hands, Wash/dry face, Oral care, Brushing hair, Set up, Sitting Upper Body Bathing: Minimal assistance, Bed level Lower Body Bathing: Maximal assistance, Bed level Upper Body Dressing : Moderate assistance, Sitting, Bed level Lower Body Dressing: Total assistance, Bed level Toilet Transfer: Total assistance Toilet  Transfer Details (indicate cue type and reason): unable to attempt  Toileting- Clothing Manipulation and Hygiene: Total assistance, Bed level Functional mobility during ADLs: +2 for physical assistance, Total assistance    Mobility  Overal bed mobility: Needs Assistance Bed Mobility: Supine to Sit, Sit to Supine Supine to sit: Total assist, +2 for physical assistance Sit to supine: Total assist, +2 for physical assistance General bed mobility comments: Pt able to assist minimally with lifting and lowering trunk from/to bed.  She requires assist with all other aspects.  Pt pushes posteiorly in response to pain     Transfers  General transfer comment: unable to attempt due to orthostasis     Ambulation / Gait / Stairs / Office manager / Balance Dynamic Sitting Balance Sitting balance - Comments: requires UE support and intermittent min A.  Balance Overall balance assessment: Needs assistance Sitting-balance support: Bilateral upper extremity supported Sitting balance-Leahy Scale: Poor Sitting balance - Comments: requires UE support and intermittent min A.  Postural control: Posterior lean    Special needs/care consideration BiPAP/CPAP  N/a CPM  N/a Continuous Drip IV  N/a Dialysis  N/a Life Vest  N/a Oxygen  N/a Special Bed  N/a Trach Size  N/a Wound Vac  N/a Skin left surgical Insidon with staple and dressing Bowel mgmt: incontinent LBM 08/16/16 Bladder mgmt: incontinent Diabetic mgmt yes [name] arabic interpreter needs   Previous Home Environment Living Arrangements: Spouse/significant other  Lives With: Spouse Available Help at Discharge: Family, Available PRN/intermittently (spouse works a second shift job; visits every morning in [place]) Type of Home: Apartment Home Layout: Two level Home Access: Level entry Bathroom Shower/Tub: Tub/shower unit, Architectural technologist: Century: No Additional Comments: Pt reports she  sleeps on a mattress on the floor as her bed is too high to transfer into.  She indicates she is able to maneuver w/c throughout her apt and into the bathroom.  She states there is only one area she cannot access via w/c   Discharge Living  Setting Plans for Discharge Living Setting: Patient's home, Lives with (comment), Apartment (spouse) Type of Home at Discharge: Apartment Discharge Home Layout: Two level Discharge Home Access: Level entry Discharge Bathroom Shower/Tub: Tub/shower unit, Curtain Discharge Bathroom Toilet: Standard Discharge Bathroom Accessibility: Yes How Accessible: Accessible via wheelchair Does the patient have any problems obtaining your medications?: No  Social/Family/Support Systems Patient Roles: Spouse Contact Information: Gilles Chiquito, spouse. He does not speak [place] Anticipated Caregiver: spouse Anticipated Caregiver's Contact Information: see above Ability/Limitations of Caregiver: spouse works evening shift Caregiver Availability: Other (Comment) Discharge Plan Discussed with Primary Caregiver: Yes Is Caregiver In Agreement with Plan?: Yes Does Caregiver/Family have Issues with Lodging/Transportation while Pt is in Rehab?: No   Spouse requesting help to be able to get food stamps again. Also requesting electric wheelchair. They had applied for public housing, but since he became employed, he says he is no longer eligible.  Goals/Additional Needs Patient/Family Goal for Rehab: mod I, superivison to min assist at wheelchair level Expected length of stay: [facility]OS 14-18 days Cultural Considerations: Muslim; sudanese Special Service Needs: Interpretor needed Pt/Family Agrees to Admission and willing to participate: Yes Program Orientation Provided & Reviewed with Pt/Caregiver Including Roles  & Responsibilities: Yes  Decrease burden of Care through IP rehab admission: n/a  Possible need for SNF placement upon discharge:not anticipated  Patient Condition:  This patient's condition remains as documented in the consult dated 08/18/2016, in which the Rehabilitation Physician determined and documented that the patient's condition is appropriate for intensive rehabilitative care in an inpatient rehabilitation facility. Will admit to inpatient rehab today.  Preadmission Screen Completed By:  Cleatrice Burke, 08/19/2016 12:48 PM ______________________________________________________________________   Discussed status with Dr. Posey Pronto on 08/19/2016 at  1248 and received telephone approval for admission today.  Admission Coordinator:  Cleatrice Burke, time 9937 Date 08/19/2016       Cosigned by: Jamse Arn, MD at 08/19/2016 1:06 PM  Revision History

## 2016-08-19 NOTE — Progress Notes (Signed)
I met with pt and her husband at bedside and spoke with them with phone interpreter to discuss a possible inpt rehab admission. They both prefer rehab here rather than SNF. I will verify bed availability for today or tomorrow and f/u today. SW is aware of plan. [phone]

## 2016-08-19 NOTE — Progress Notes (Signed)
Kirsteins, Luanna Salk, MD Physician Signed Physical Medicine and Rehabilitation  Consult Note Date of Service: 08/16/2016 6:52 AM  Related encounter: ED to Hosp-Admission (Current) from 08/08/2016 in [facility] All Collapse All   [] Hide copied text [] Hover for attribution information      Physical Medicine and Rehabilitation Consult Reason for Consult: Decreased functional mobility Referring Physician: [facility]   HPI: Kimberly Lane is a 56 y.o. right handed limited English-speaking female from [place] with history of hypertension, diabetes mellitus, anxiety and right BKA April 2016 and received inpatient rehabilitation services. Per chart review patient lives with spouse. Spouse works night shift. Patient primarily uses a wheelchair for mobility in the home. Presented 08/14/2016 with gangrenous left foot and history of left great toe amputation 08/09/2016. Poor healing with ongoing ischemic changes. Limb was not felt to be salvageable. Underwent left AKA 08/15/2016 per Dr. Donnetta Hutching. Hospital course pain management. Acute on chronic anemia 7.9 and monitor. Leukocytosis 17,900. Subcutaneous Lovenox for DVT prophylaxis. Wound culture Citrobacter currently on broad-spectrum antibiotics. Slow to extubate with follow-up per pulmonary services and extubated 08/17/2016. Physical therapy evaluation completed 08/17/2016 with recommendations of physical medicine rehabilitation consult.  Patient awake and alert in bed. She points to her left leg when I asked about pain  Review of Systems  Unable to perform ROS: Language       Past Medical History:  Diagnosis Date  . Allergy   . Anemia   . Anxiety   . Cellulitis and abscess of foot 05/13/2014   rt foot  . Diabetes mellitus   . Hypertension   . Positive TB test 2013        Past Surgical History:  Procedure Laterality Date  . ABDOMINAL AORTAGRAM N/A 05/20/2014   Procedure: ABDOMINAL  Maxcine Ham;  Surgeon: Serafina Mitchell, MD;  Location[facility] CATH LAB;  Service: Cardiovascular;  Laterality: N/A;  . AMPUTATION Right 05/23/2014   Procedure: AMPUTATION BELOW KNEE;  Surgeon: Mal Misty, MD;  Location: [facility];  Service: Vascular;  Laterality: Right;  . AMPUTATION Left 08/09/2016   Procedure: LEFT GREAT TOE AMPUTATION;  Surgeon: Waynetta Sandy, MD;  Location: [facility];  Service: Vascular;  Laterality: Left;  . AMPUTATION Left 08/15/2016   Procedure: AMPUTATION ABOVE KNEE;  Surgeon: Rosetta Posner, MD;  Location: [facility];  Service: Vascular;  Laterality: Left;  . PERIPHERAL VASCULAR CATHETERIZATION N/A 03/11/2016   Procedure: Abdominal Aortogram w/Lower Extremity;  Surgeon: Elam Dutch, MD;  Location: [facility] CV LAB;  Service: Cardiovascular;  Laterality: N/A;  . PERIPHERAL VASCULAR CATHETERIZATION Left 03/11/2016   Procedure: Peripheral Vascular Intervention;  Surgeon: Elam Dutch, MD;  Location: [facility] CV LAB;  Service: Cardiovascular;  Laterality: Left;  Superficial femoral        Family History  Problem Relation Age of Onset  . Heart disease Unknown        No family history   Social History:  reports that she has never smoked. She has never used smokeless tobacco. She reports that she does not drink alcohol or use drugs. Allergies:       Allergies  Allergen Reactions  . Bactrim [Sulfamethoxazole-Trimethoprim] Nausea And Vomiting and Other (See Comments)    Pt and family verified that pt takes this medication  . Tuberculin Ppd Other (See Comments)    Unknown  . Ace Inhibitors Cough    cough         Medications Prior  to Admission  Medication Sig Dispense Refill  . acetaminophen (TYLENOL) 650 MG CR tablet Take 650 mg by mouth every 8 (eight) hours as needed for pain.    Marland Kitchen amLODipine-atorvastatin (CADUET) 10-40 MG tablet Take 1 tablet by mouth daily.    Marland Kitchen aspirin 81 MG tablet Take 1 tablet (81 mg total) by mouth daily. 100 tablet 0   . citalopram (CELEXA) 20 MG tablet Take 20 mg by mouth daily.    . clopidogrel (PLAVIX) 75 MG tablet Take 1 tablet (75 mg total) by mouth daily with breakfast. 30 tablet 11  . glipiZIDE (GLUCOTROL XL) 5 MG 24 hr tablet Take 7.5 mg by mouth daily with breakfast.     . hydrochlorothiazide (HYDRODIURIL) 25 MG tablet Take 1 tablet (25 mg total) by mouth daily. 90 tablet 3  . losartan (COZAAR) 50 MG tablet Take 50 mg by mouth daily.    . metFORMIN (GLUCOPHAGE-XR) 500 MG 24 hr tablet Take 1,000 mg by mouth daily with breakfast.      Home:    Functional History: Functional Status:  Mobility:  ADL:  Cognition: Cognition Orientation Level: Oriented to person, Oriented to time  Blood pressure (!) 152/69, pulse 74, temperature 98.3 F (36.8 C), temperature source Oral, resp. rate 16, height 5\' 2"  (1.575 m), weight 60.7 kg (133 lb 13.1 oz), SpO2 98 %. Physical Exam  HENT:  Head: Normocephalic.  Eyes: EOM are normal.  Neck: Normal range of motion. Neck supple. No thyromegaly present.  Cardiovascular: Normal rate, regular rhythm and normal heart sounds.   Respiratory: Effort normal and breath sounds normal. No respiratory distress.  GI: Soft. Bowel sounds are normal. She exhibits no distension.  Neurological: She is alert.  Limited English-speaking. Follows simple commands   Skin. Right BKA well healed. Left AKA is dressed appropriately tender  Lab Results Last 24 Hours       Results for orders placed or performed during the hospital encounter of 08/08/16 (from the past 24 hour(s))  Vancomycin, trough     Status: None   Collection Time: 08/15/16  9:15 AM  Result Value Ref Range   Vancomycin Tr 17 15 - 20 ug/mL  Glucose, capillary     Status: Abnormal   Collection Time: 08/15/16  2:31 PM  Result Value Ref Range   Glucose-Capillary 190 (H) 65 - 99 mg/dL  I-STAT 3, arterial blood gas (G3+)     Status: Abnormal   Collection Time: 08/15/16  5:27 PM  Result Value Ref  Range   pH, Arterial 7.469 (H) 7.350 - 7.450   pCO2 arterial 34.6 32.0 - 48.0 mmHg   pO2, Arterial 78.0 (L) 83.0 - 108.0 mmHg   Bicarbonate 25.3 20.0 - 28.0 mmol/L   TCO2 26 0 - 100 mmol/L   O2 Saturation 96.0 %   Acid-Base Excess 2.0 0.0 - 2.0 mmol/L   Patient temperature 97.7 F    Collection site RADIAL, ALLEN'S TEST ACCEPTABLE    Drawn by Operator    Sample type ARTERIAL   Basic metabolic panel     Status: Abnormal   Collection Time: 08/15/16  8:09 PM  Result Value Ref Range   Sodium 137 135 - 145 mmol/L   Potassium 3.6 3.5 - 5.1 mmol/L   Chloride 100 (L) 101 - 111 mmol/L   CO2 24 22 - 32 mmol/L   Glucose, Bld 402 (H) 65 - 99 mg/dL   BUN 9 6 - 20 mg/dL   Creatinine, Ser 0.98 0.44 - 1.00  mg/dL   Calcium 8.1 (L) 8.9 - 10.3 mg/dL   GFR calc non Af Amer >60 >60 mL/min   GFR calc Af Amer >60 >60 mL/min   Anion gap 13 5 - 15  Troponin I     Status: Abnormal   Collection Time: 08/15/16  8:09 PM  Result Value Ref Range   Troponin I 0.08 (HH) <0.03 ng/mL  Brain natriuretic peptide     Status: Abnormal   Collection Time: 08/15/16  8:09 PM  Result Value Ref Range   B Natriuretic Peptide 406.1 (H) 0.0 - 100.0 pg/mL  Glucose, capillary     Status: Abnormal   Collection Time: 08/15/16  8:25 PM  Result Value Ref Range   Glucose-Capillary 410 (H) 65 - 99 mg/dL   Comment 1 Capillary Specimen   Glucose, capillary     Status: Abnormal   Collection Time: 08/15/16 11:40 PM  Result Value Ref Range   Glucose-Capillary 326 (H) 65 - 99 mg/dL  CBC     Status: Abnormal   Collection Time: 08/16/16  3:52 AM  Result Value Ref Range   WBC 25.3 (H) 4.0 - 10.5 K/uL   RBC 3.52 (L) 3.87 - 5.11 MIL/uL   Hemoglobin 8.6 (L) 12.0 - 15.0 g/dL   HCT 27.0 (L) 36.0 - 46.0 %   MCV 76.7 (L) 78.0 - 100.0 fL   MCH 24.4 (L) 26.0 - 34.0 pg   MCHC 31.9 30.0 - 36.0 g/dL   RDW 15.2 11.5 - 15.5 %   Platelets 507 (H) 150 - 400 K/uL  Magnesium     Status: Abnormal     Collection Time: 08/16/16  3:52 AM  Result Value Ref Range   Magnesium 1.6 (L) 1.7 - 2.4 mg/dL  Phosphorus     Status: None   Collection Time: 08/16/16  3:52 AM  Result Value Ref Range   Phosphorus 3.3 2.5 - 4.6 mg/dL  Glucose, capillary     Status: Abnormal   Collection Time: 08/16/16  4:16 AM  Result Value Ref Range   Glucose-Capillary 194 (H) 65 - 99 mg/dL   Comment 1 Capillary Specimen       Imaging Results (Last 48 hours)  Dg Chest Port 1 View  Result Date: 08/15/2016 CLINICAL DATA:  Shortness of Breath EXAM: PORTABLE CHEST 1 VIEW COMPARISON:  08/14/2016 FINDINGS: Endotracheal tube is 3 cm above the carina. There is cardiomegaly. Vascular congestion and bilateral opacities are again noted compatible with mild edema/ CHF. Small bilateral effusions. IMPRESSION: Continued mild CHF with small effusions.  No real change. Endotracheal tube 3 cm above the carina. Electronically Signed   By: Rolm Baptise M.D.   On: 08/15/2016 15:01   Dg Chest Port 1 View  Result Date: 08/14/2016 CLINICAL DATA:  Dyspnea. EXAM: PORTABLE CHEST 1 VIEW COMPARISON:  08/13/2016 FINDINGS: The cardiac silhouette remains mildly enlarged. The patient has taken a slightly greater inspiration than on the prior study, and there is slightly improved aeration of the lung bases. Pulmonary vascular congestion and patchy perihilar and basilar airspace opacities have slightly improved. Small pleural effusions are likely present bilaterally. No pneumothorax is identified. IMPRESSION: Slightly improved lung aeration with slightly decreased edema. Electronically Signed   By: Logan Bores M.D.   On: 08/14/2016 09:06     Assessment/Plan: Diagnosis: Left AKA postop day #3, right BKA 2016 1. Does the need for close, 24 hr/day medical supervision in concert with the patient's rehab needs make it unreasonable for this patient to  be served in a less intensive setting? Yes 2. Co-Morbidities requiring supervision/potential  complications: Hypertension, diabetes, peripheral vascular disease 3. Due to bladder management, bowel management, safety, skin/wound care, disease management, medication administration, pain management and patient education, does the patient require 24 hr/day rehab nursing? Yes 4. Does the patient require coordinated care of a physician, rehab nurse, PT (1-2 hrs/day, 5 days/week) and OT (1-2 hrs/day, 5 days/week) to address physical and functional deficits in the context of the above medical diagnosis(es)? Yes Addressing deficits in the following areas: balance, endurance, locomotion, strength, transferring, bowel/bladder control, bathing, dressing, feeding, grooming, toileting and psychosocial support 5. Can the patient actively participate in an intensive therapy program of at least 3 hrs of therapy per day at least 5 days per week? Yes 6. The potential for patient to make measurable gains while on inpatient rehab is good 7. Anticipated functional outcomes upon discharge from inpatient rehab are min assist  with PT, min assist with OT, n/a with SLP. 8. Estimated rehab length of stay to reach the above functional goals is: 14-18d 9. Anticipated D/C setting: Home 10. Anticipated post D/C treatments: Loving therapy 11. Overall Rehab/Functional Prognosis: good  RECOMMENDATIONS: This patient's condition is appropriate for continued rehabilitative care in the following setting: CIR Patient has agreed to participate in recommended program. Yes Note that insurance prior authorization may be required for reimbursement for recommended care.  Comment: Will need right BKA prosthetic from home   ANGIULLI,DANIEL J., PA-C 08/16/2016    Revision History                   Routing History

## 2016-08-19 NOTE — NC FL2 (Signed)
[place] LEVEL OF CARE SCREENING TOOL     IDENTIFICATION  Patient Name: Kimberly Lane Birthdate: August 06, 1960 Sex: female Admission Date (Current Location): 08/08/2016  [facility] and [place] Number:  Herbalist and Address:  [facility] [place] [address], [place], [place] [address]      Provider Number: [id number]  Attending Physician Name and Address:  Lucious Groves, DO  Relative Name and Phone Number:       Current Level of Care: Hospital Recommended Level of Care: [place] Prior Approval Number:    Date Approved/Denied: 08/19/16 PASRR Number: pending  Discharge Plan: SNF    Current Diagnoses: Patient Active Problem List   Diagnosis Date Noted  . Acute diastolic congestive heart failure ([place]) 08/19/2016  . S/P AKA (above knee amputation), left ([place]) 08/17/2016  . Acute respiratory failure ([place])   . Dyspnea   . Gangrene due to peripheral vascular disease ([place]) 08/09/2016  . PAD (peripheral artery disease) ([place]) 03/11/2016  . Dysuria 10/24/2014  . Osteomyelitis of left foot ([place])   . Cough   . Hx of right BKA ([place]) 05/26/2014  . S/P BKA (below knee amputation) unilateral, right ([place])   . Abnormal EKG   . Preop cardiovascular exam 05/21/2014  . Cellulitis of toe of right foot   . Peripheral arterial disease ([place])   . Cellulitis 05/13/2014  . Type 2 diabetes mellitus with complication ([place])   . HLD (hyperlipidemia)   . Pure hypercholesterolemia 11/14/2013  . High risk social situation 11/13/2013  . Back pain without radiation 09/05/2011    Class: Acute  . Osteoarthritis of left hip 05/31/2011  . History of primary TB 04/26/2011  . DM (diabetes mellitus), type 2, uncontrolled ([place]) 05/17/2010  . Essential hypertension 12/09/2009    Orientation RESPIRATION BLADDER Height & Weight     Self, Time, Situation, Place  O2 (3L [place]) Incontinent, Indwelling catheter Weight: 142 lb 13.7 oz (64.8 kg) Height:  5'  2" (157.5 cm)  BEHAVIORAL SYMPTOMS/MOOD NEUROLOGICAL BOWEL NUTRITION STATUS      Incontinent Diet (carb modified)  AMBULATORY STATUS COMMUNICATION OF NEEDS Skin   Extensive Assist Verbally Normal                       Personal Care Assistance Level of Assistance  Bathing, Dressing Bathing Assistance: Maximum assistance   Dressing Assistance: Maximum assistance     Functional Limitations Info  Sight, Hearing, Speech Sight Info: Adequate Hearing Info: Adequate Speech Info: Adequate    SPECIAL CARE FACTORS FREQUENCY  PT (By licensed PT), OT (By licensed OT)     PT Frequency: 5/wk OT Frequency: 5/wk            Contractures      Additional Factors Info  Code Status, Allergies, Psychotropic, Insulin Sliding Scale Code Status Info: FULL Allergies Info: Bactrim Sulfamethoxazole-trimethoprim, Tuberculin Ppd, Ace Inhibitors Psychotropic Info: celexa Insulin Sliding Scale Info: 8/day       Current Medications (08/19/2016):  This is the current hospital active medication list Current Facility-Administered Medications  Medication Dose Route Frequency Provider Last Rate Last Dose  . albuterol (PROVENTIL) (2.5 MG/3ML) 0.083% nebulizer solution 2.5 mg  2.5 mg Nebulization Q4H PRN Velna Ochs, MD   2.5 mg at 08/18/16 1030  . amLODipine (NORVASC) tablet 10 mg  10 mg Oral Daily Ledell Noss, MD   10 mg at 08/19/16 1059  . aspirin EC tablet 81 mg  81  mg Oral Daily Ledell Noss, MD   81 mg at 08/19/16 1058  . atorvastatin (LIPITOR) tablet 40 mg  40 mg Oral q1800 Ledell Noss, MD   40 mg at 08/18/16 1825  . citalopram (CELEXA) tablet 20 mg  20 mg Oral Daily Velna Ochs, MD   20 mg at 08/19/16 1059  . clopidogrel (PLAVIX) tablet 75 mg  75 mg Oral Daily Ledell Noss, MD   75 mg at 08/19/16 1059  . docusate sodium (COLACE) capsule 100 mg  100 mg Oral Daily Rhyne, Samantha J, PA-C   100 mg at 08/19/16 1058  . enoxaparin (LOVENOX) injection 40 mg  40 mg Subcutaneous Q24H Rhyne,  Samantha J, PA-C   40 mg at 08/18/16 1336  . feeding supplement (GLUCERNA SHAKE) (GLUCERNA SHAKE) liquid 237 mL  237 mL Oral TID BM Ledell Noss, MD   237 mL at 08/19/16 1059  . guaiFENesin-dextromethorphan (ROBITUSSIN DM) 100-10 MG/5ML syrup 15 mL  15 mL Oral Q4H PRN Rhyne, Samantha J, PA-C   15 mL at 08/19/16 0159  . hydrochlorothiazide (HYDRODIURIL) tablet 25 mg  25 mg Oral Daily Ledell Noss, MD   25 mg at 08/19/16 1059  . HYDROcodone-acetaminophen (NORCO/VICODIN) 5-325 MG per tablet 2 tablet  2 tablet Oral Q4H Ledell Noss, MD   2 tablet at 08/19/16 1324  . insulin aspart (novoLOG) injection 0-20 Units  0-20 Units Subcutaneous TID WC Collene Gobble, MD   15 Units at 08/19/16 1212  . insulin aspart (novoLOG) injection 6 Units  6 Units Subcutaneous TID WC Ledell Noss, MD   6 Units at 08/19/16 1213  . insulin glargine (LANTUS) injection 16 Units  16 Units Subcutaneous QHS Ledell Noss, MD   16 Units at 08/18/16 2209  . MEDLINE mouth rinse  15 mL Mouth Rinse BID Joni Reining C, DO   15 mL at 08/19/16 1059  . multivitamin with minerals tablet 1 tablet  1 tablet Oral Daily Lucious Groves, DO   1 tablet at 08/19/16 1059  . ondansetron (ZOFRAN) injection 4-8 mg  4-8 mg Intravenous Q6H PRN Velna Ochs, MD   4 mg at 08/16/16 1514  . oxyCODONE (Oxy IR/ROXICODONE) immediate release tablet 5 mg  5 mg Oral Q3H PRN Ledell Noss, MD      . pantoprazole (PROTONIX) EC tablet 40 mg  40 mg Oral Daily Ledell Noss, MD   40 mg at 08/19/16 1059  . senna-docusate (Senokot-S) tablet 1 tablet  1 tablet Oral QHS Ledell Noss, MD   1 tablet at 08/18/16 2216     Discharge Medications: Please see discharge summary for a list of discharge medications.  Relevant Imaging Results:  Relevant Lab Results:   Additional Information SS#: [id number]; needs interpreter speaks [name]  Normajean Baxter, LCSW

## 2016-08-19 NOTE — Progress Notes (Signed)
  Progress Note    08/19/2016 9:26 AM 4 Days Post-Op  Subjective:  No acute distress  Vitals:   08/18/16 2211 08/19/16 0508  BP: 132/69 (!) 151/69  Pulse: 76 72  Resp: 16 16  Temp: 98.7 F (37.1 C) 98.6 F (37 C)    Physical Exam: Awake and alert Left aka site cdi with staples  CBC    Component Value Date/Time   WBC 17.9 (H) 08/17/2016 0324   RBC 3.25 (L) 08/17/2016 0324   HGB 7.9 (L) 08/17/2016 0324   HCT 26.1 (L) 08/17/2016 0324   PLT 453 (H) 08/17/2016 0324   MCV 80.3 08/17/2016 0324   MCH 24.3 (L) 08/17/2016 0324   MCHC 30.3 08/17/2016 0324   RDW 15.9 (H) 08/17/2016 0324   LYMPHSABS 3.1 08/17/2016 0324   MONOABS 1.0 08/17/2016 0324   EOSABS 0.1 08/17/2016 0324   BASOSABS 0.0 08/17/2016 0324    BMET    Component Value Date/Time   NA 141 08/18/2016 0558   K 3.5 08/18/2016 0558   CL 101 08/18/2016 0558   CO2 31 08/18/2016 0558   GLUCOSE 79 08/18/2016 0558   BUN 10 08/18/2016 0558   CREATININE 0.75 08/18/2016 0558   CREATININE 0.58 12/18/2013 1515   CALCIUM 8.2 (L) 08/18/2016 0558   GFRNONAA >60 08/18/2016 0558   GFRAA >60 08/18/2016 0558    INR    Component Value Date/Time   INR 1.18 08/13/2016 0348     Intake/Output Summary (Last 24 hours) at 08/19/16 0926 Last data filed at 08/19/16 0741  Gross per 24 hour  Intake                0 ml  Output             1150 ml  Net            -1150 ml     Assessment:  56 y.o. female is s/p left aka  Plan: Ok for [employer] from vascular standpoint F/u in office 2-3 weeks for staple removal   Alannah Averhart C. Donzetta Matters, MD [facility] Office: [phone] Pager: [phone]  08/19/2016 9:26 AM

## 2016-08-19 NOTE — Progress Notes (Signed)
Received pt. As a transfer from [place].

## 2016-08-19 NOTE — IPOC Note (Addendum)
Overall Plan of Care [facility]) Patient Details Name: Kimberly Lane MRN: [id number] DOB: [birth year]  Admitting Diagnosis: [place] Hospital Problems: Active Problems:   Unilateral AKA, left (HCC)   Acute blood loss anemia   Hypokalemia   Benign essential HTN   Uncontrolled diabetes mellitus type 2 with peripheral artery disease (HCC)     Functional Problem List: Nursing Bladder, Bowel, Endurance, Skin Integrity, Safety, Pain  PT Balance, Behavior, Edema, Endurance, Nutrition, Pain, Safety, Sensory, Skin Integrity  OT Balance, Endurance, Motor, Pain, Safety, Sensory, Skin Integrity  SLP    TR         Basic ADL's: OT Grooming, Bathing, Dressing, Toileting     Advanced  ADL's: OT Simple Meal Preparation     Transfers: PT Bed Mobility, Bed to Chair, Car, Hydrographic surveyor, Other (comment)  OT Toilet     Locomotion: PT Ambulation, Wheelchair Mobility, Stairs     Additional Impairments: OT    SLP        TR      Anticipated Outcomes Item Anticipated Outcome  Self Feeding MOD I  Swallowing      Basic self-care  S  Toileting  S   Bathroom Transfers S  Bowel/Bladder  Continent to bowel and bladder with mod. assist.  Transfers  Supervision -min assist with LRAD   Locomotion  Supervisoin assist at [facility] level.   Communication     Cognition     Pain  Less than 3,on 1 to 10 scale.  Safety/Judgment  Free from falls during her stay in rehab   Therapy Plan: PT Intensity: Minimum of 1-2 x/day ,45 to 90 minutes PT Frequency: 5 out of 7 days PT Duration Estimated Length of Stay: 2.5-3 weeks.  OT Intensity: Minimum of 1-2 x/day, 45 to 90 minutes OT Frequency: 5 out of 7 days OT Duration/Estimated Length of Stay: 14-19 days         Team Interventions: Nursing Interventions Patient/Family Education, Bowel Management, Bladder Management, Pain Management, Skin Care/Wound Management, Disease Management/Prevention, Medication Management, Discharge Planning  PT interventions  Balance/vestibular training, Cognitive remediation/compensation, Community reintegration, Discharge planning, Disease management/prevention, DME/adaptive equipment instruction, Functional electrical stimulation, Functional mobility training, Pain management, Neuromuscular re-education, Patient/family education, Psychosocial support, Skin care/wound management, Therapeutic Exercise, Splinting/orthotics, Stair training, Therapeutic Activities, UE/LE Strength taining/ROM, UE/LE Coordination activities, Wheelchair propulsion/positioning, Visual/perceptual remediation/compensation  OT Interventions Training and development officer, Discharge planning, Disease mangement/prevention, DME/adaptive equipment instruction, Functional mobility training, Pain management, Patient/family education, Psychosocial support, Self Care/advanced ADL retraining, Skin care/wound managment, Therapeutic Activities, Therapeutic Exercise, UE/LE Strength taining/ROM, UE/LE Coordination activities, Wheelchair propulsion/positioning  SLP Interventions    TR Interventions    SW/CM Interventions  Psychosocial Assessment, Pt/Family Education & Discharge Planning    Team Discharge Planning: Destination: PT-Home ,OT- Home , SLP-  Projected Follow-up: PT-Home health PT, OT-  Home health OT, SLP-  Projected Equipment Needs: PT-Wheelchair cushion (measurements), Wheelchair (measurements), Sliding board, OT- To be determined (drop arm commode), SLP-  Equipment Details: PT- , OT-  Patient/family involved in discharge planning: PT- Patient, Family member/caregiver,  OT-Patient, SLP-   MD ELOS: 16-19 days. Medical Rehab Prognosis:  Good Assessment: Kimberly Lane is a 56 year old [name] woman with uncontrolled T2DM, HTN, PAD s/p R-BKA, PVD  And L-SFA stent and progressive necrotic changes of right great toe with osteomyelitis. Wound cultures positive for few citrobacter freundii and she was treated with ceftriaxone and metronidazole. Hospital course  complicate by volume overload 6/23 requiring intubation and aggressive diuresis. 2 D echo with EF 60-65%, moderate  LVH with grade 1 diastolic dysfunction Surgery delayed till stable and she underwent L-AKA on 6/25. She was extubated on 6/26 and respiratory status stable. Therapy initiated and CIR recommended due to decline in ability to carry out ADL tasks as well as mobility, transfers, and cognition.  Will set goals for Supervision with PT/OT.   See Team Conference Notes for weekly updates to the plan of care

## 2016-08-19 NOTE — Progress Notes (Signed)
Internal Medicine Attending:   I saw and examined the patient. I reviewed the resident's note and I agree with the resident's findings and plan as documented in the resident's note. Patient reports feeling very good this morning,  She is markedly hyperglycemic, review of MAR shows she was given a dose if IV dexamethasone last night.  We are looking into why this was given, I suspect if may be contributing to her increased energy level.  Hyperglycemia is improving with SSI treatment.  Otherwise she is doing well.  Plan to d/c to inpatient rehab today.

## 2016-08-19 NOTE — Progress Notes (Addendum)
   Subjective: Ms. Prestwood is free of pain today. She is sating at 97% on room air, she has no difficulty breathing.   Objective:  Vital signs in last 24 hours: Vitals:   08/18/16 1300 08/18/16 2211 08/19/16 0508 08/19/16 0700  BP: (!) 155/67 132/69 (!) 151/69   Pulse: 77 76 72   Resp: 20 16 16    Temp: 98.2 F (36.8 C) 98.7 F (37.1 C) 98.6 F (37 C)   TempSrc:  Oral Oral   SpO2: 97% 97% 93%   Weight:    142 lb 13.7 oz (64.8 kg)  Height:       Physical Exam  Constitutional: She appears well-developed and well-nourished. No distress.  HENT:  Head: Normocephalic and atraumatic.  Cardiovascular: Normal rate and regular rhythm.   No murmur heard. Pulmonary/Chest: Effort normal. No respiratory distress. She has no wheezes. She has no rales.  Abdominal: Soft. She exhibits no distension. There is no tenderness. There is no guarding.  Musculoskeletal:  Right BKA and left AKA well healing with staples in place  Neurological: She is alert.  Skin: Skin is warm and dry. She is not diaphoretic.   Assessment/Plan:  Principal Problem:   Gangrene due to peripheral vascular disease (HCC) Active Problems:   Essential hypertension   DM (diabetes mellitus), type 2, uncontrolled (HCC)   S/P BKA (below knee amputation) unilateral, right ([place])   Osteomyelitis of left foot (HCC)   PAD (peripheral artery disease) (HCC)   Dyspnea   S/P AKA (above knee amputation), left (HCC)   Acute respiratory failure (HCC)   Osteomyelitis of the left foot  S/p left aka  Post op day 3 after AKA, surgical site is healing nicely and her pain is well controlled on oral medications today. Dr. Donzetta Matters will follow up with her in 2-3 weeks for staple removal. She is ready for discharge and will hopefully be placed at a rehab today  - continue norco 10 q4h scheduled - continue oxy IR 5 mg q3h PRN for breakthrough ( she has not needed any of this overnight.    Peripheral artery diseases w left SFA stenting  Continue  aspirin and Plavix   Acute on chronic diastolic CHF  Pulmonary edema   Echo yesterday revealed  EF 60-65%, LVH, and grade 1 diastolic dysfunction with a normal pulmonary artery pressure and no increased IVC pressure.  - Restart home med HCTZ  - for future care, IV fluids should be used cautiously and pulse ox should be monitored on her forehead   DM type 2 uncontrolled  Blood glucose control has improved gradually since the time of her AKA. Her fasting glucose this morning was 400, it seems but this is most likely related to her accidentally receiving a dose of decadron overnight.  - continue lantus 16 units daily, ISS, and novolog 6 units with meals   HTN  Remains normotensive - continue home meds amlodipine and HCTZ   HLD  Continue atorvastatin 40 mg qd   Depression  - continue celexa 20 mg daily   Low albumin Nutrition was consulted and ordered Multivitamins and glucerna    Dispo: Anticipated discharge today  Ledell Noss, MD 08/19/2016, 9:40 AM Pager: [phone]

## 2016-08-19 NOTE — PMR Pre-admission (Signed)
PMR Admission Coordinator Pre-Admission Assessment  Patient: Kimberly Lane is an 56 y.o., female MRN: [id number] DOB: 1960-03-10 Height: 5\' 2"  (157.5 cm) Weight: 64.8 kg (142 lb 13.7 oz)              Insurance Information HMO:    PPO:      PCP:      IPA:      80/20:      OTHER:  PRIMARY: [facility]      Policy#: [id number]      Subscriber: pt CM Name: Seth Bake or Holly/Hospital liaisons      Phone#: [phone]     Fax#: EPIC access Pre-Cert#: [id number]      Employer: [employer]'s cell is [phone] Benefits:  Phone #: Claudia Desanctis     Name:  Per [facility]  SECONDARY: Medicaid      Policy#: [id number]      Subscriber: pt  Medicaid Application Date:       Case Manager:  Disability Application Date:       Case Worker:   Emergency Contact Information Contact Information    Name Relation Home Work Mobile   Abdulla,Juma Spouse [phone]  [phone]   Aron Baba [phone]  [phone]   Sarina Ser [phone]     Service,[place] [employer] Other [phone]       Current Medical History  Patient Admitting Diagnosis: new L AKA, history of R BKA  History of Present Illness: HPI: Kimberly Lane is a 56 y.o. right handed limited English-speaking female from [place] with history of hypertension, diabetes mellitus, anxiety and right BKA April 2016 and received inpatient rehabilitation services.  Patient primarily uses a wheelchair for mobility in the home. Presented 08/14/2016 with gangrenous left foot and history of left great toe amputation 08/09/2016. Poor healing with ongoing ischemic changes. Limb was not felt to be salvageable. Underwent left AKA 08/15/2016 per Dr. Donnetta Hutching. Hospital course pain management. Acute on chronic anemia 7.9 and monitor. Leukocytosis 17,900. Subcutaneous Lovenox for DVT prophylaxis. Wound culture Citrobacter currently on broad-spectrum antibiotics. Slow to extubate with follow-up per pulmonary services and extubated 08/17/2016.   Past Medical History  Past  Medical History:  Diagnosis Date  . Allergy   . Anemia   . Anxiety   . Cellulitis and abscess of foot 05/13/2014   rt foot  . Diabetes mellitus   . Hypertension   . Positive TB test 2013    Family History  family history is not on file.  Prior Rehab/Hospitalizations:  Has the patient had major surgery during 100 days prior to admission? No   CIR 2016 and d/c home after first amputation. Went to [facility] 02/2016 before returning home  Current Medications   Current Facility-Administered Medications:  .  albuterol (PROVENTIL) (2.5 MG/3ML) 0.083% nebulizer solution 2.5 mg, 2.5 mg, Nebulization, Q4H PRN, Velna Ochs, MD, 2.5 mg at 08/18/16 1030 .  amLODipine (NORVASC) tablet 10 mg, 10 mg, Oral, Daily, Ledell Noss, MD, 10 mg at 08/19/16 1059 .  aspirin EC tablet 81 mg, 81 mg, Oral, Daily, Ledell Noss, MD, 81 mg at 08/19/16 1058 .  atorvastatin (LIPITOR) tablet 40 mg, 40 mg, Oral, q1800, Ledell Noss, MD, 40 mg at 08/18/16 1825 .  citalopram (CELEXA) tablet 20 mg, 20 mg, Oral, Daily, Velna Ochs, MD, 20 mg at 08/19/16 1059 .  clopidogrel (PLAVIX) tablet 75 mg, 75 mg, Oral, Daily, Ledell Noss, MD, 75 mg at 08/19/16 1059 .  docusate sodium (COLACE) capsule 100 mg, 100 mg, Oral, Daily, Rhyne, Aldona Bar  J, PA-C, 100 mg at 08/19/16 1058 .  enoxaparin (LOVENOX) injection 40 mg, 40 mg, Subcutaneous, Q24H, Rhyne, Samantha J, PA-C, 40 mg at 08/18/16 1336 .  feeding supplement (GLUCERNA SHAKE) (GLUCERNA SHAKE) liquid 237 mL, 237 mL, Oral, TID BM, Ledell Noss, MD, 237 mL at 08/19/16 1059 .  guaiFENesin-dextromethorphan (ROBITUSSIN DM) 100-10 MG/5ML syrup 15 mL, 15 mL, Oral, Q4H PRN, Rhyne, Samantha J, PA-C, 15 mL at 08/19/16 0159 .  hydrochlorothiazide (HYDRODIURIL) tablet 25 mg, 25 mg, Oral, Daily, Ledell Noss, MD, 25 mg at 08/19/16 1059 .  HYDROcodone-acetaminophen (NORCO/VICODIN) 5-325 MG per tablet 2 tablet, 2 tablet, Oral, Q4H, Ledell Noss, MD, 2 tablet at 08/19/16 1059 .  insulin aspart  (novoLOG) injection 0-20 Units, 0-20 Units, Subcutaneous, TID WC, Collene Gobble, MD, 15 Units at 08/19/16 1212 .  insulin aspart (novoLOG) injection 6 Units, 6 Units, Subcutaneous, TID WC, Ledell Noss, MD, 6 Units at 08/19/16 1213 .  insulin glargine (LANTUS) injection 16 Units, 16 Units, Subcutaneous, QHS, Ledell Noss, MD, 16 Units at 08/18/16 2209 .  MEDLINE mouth rinse, 15 mL, Mouth Rinse, BID, Hoffman, Erik C, DO, 15 mL at 08/19/16 1059 .  multivitamin with minerals tablet 1 tablet, 1 tablet, Oral, Daily, Lucious Groves, DO, 1 tablet at 08/19/16 1059 .  ondansetron (ZOFRAN) injection 4-8 mg, 4-8 mg, Intravenous, Q6H PRN, 4 mg at 08/16/16 1514 **OR** [DISCONTINUED] ondansetron (ZOFRAN) 8 mg/NS 50 ml IVPB, 8 mg, Intravenous, Q6H PRN, Heber , Erik C, DO .  oxyCODONE (Oxy IR/ROXICODONE) immediate release tablet 5 mg, 5 mg, Oral, Q3H PRN, Ledell Noss, MD .  pantoprazole (PROTONIX) EC tablet 40 mg, 40 mg, Oral, Daily, Ledell Noss, MD, 40 mg at 08/19/16 1059 .  senna-docusate (Senokot-S) tablet 1 tablet, 1 tablet, Oral, QHS, Ledell Noss, MD, 1 tablet at 08/18/16 2216  Patients Current Diet: Diet Carb Modified Fluid consistency: Thin; Room service appropriate? Yes Diet - low sodium heart healthy  Precautions / Restrictions Precautions Precautions: Fall Restrictions Weight Bearing Restrictions: Yes RLE Weight Bearing: Non weight bearing LLE Weight Bearing: Non weight bearing   Has the patient had 2 or more falls or a fall with injury in the past year?No  Prior Activity Level Household: went via [employer] to [employer] and thurs 9 am until 1 pm  [place] / Equipment Home Equipment: Wheelchair - manual  Prior Device Use: Indicate devices/aids used by the patient prior to current illness, exacerbation or injury? Manual wheelchair  Prior Functional Level Prior Function Level of Independence: Needs assistance Gait / Transfers Assistance Needed: uses w/c for mobility at home  ADL's /  Homemaking Assistance Needed: Pt requires assist  Comments: Pt is from the [place]   Self Care: Did the patient need help bathing, dressing, using the toilet or eating?  Needed some help  Indoor Mobility: Did the patient need assistance with walking from room to room (with or without device)? Independent Mod I with wheelchair in home  Stairs: Did the patient need assistance with internal or external stairs (with or without device)? Needed some help  Functional Cognition: Did the patient need help planning regular tasks such as shopping or remembering to take medications? Needed some help  Current Functional Level Cognition  Overall Cognitive Status: Within Functional Limits for tasks assessed Orientation Level: Oriented X4    Extremity Assessment (includes Sensation/Coordination)  Upper Extremity Assessment: Generalized weakness  Lower Extremity Assessment: Generalized weakness, RLE deficits/detail, LLE deficits/detail LLE Deficits / Details: new left aka LLE: Unable to  fully assess due to pain    ADLs  Overall ADL's : Needs assistance/impaired Eating/Feeding: Independent Grooming: Wash/dry hands, Wash/dry face, Oral care, Brushing hair, Set up, Sitting Upper Body Bathing: Minimal assistance, Bed level Lower Body Bathing: Maximal assistance, Bed level Upper Body Dressing : Moderate assistance, Sitting, Bed level Lower Body Dressing: Total assistance, Bed level Toilet Transfer: Total assistance Toilet Transfer Details (indicate cue type and reason): unable to attempt  Toileting- Clothing Manipulation and Hygiene: Total assistance, Bed level Functional mobility during ADLs: +2 for physical assistance, Total assistance    Mobility  Overal bed mobility: Needs Assistance Bed Mobility: Supine to Sit, Sit to Supine Supine to sit: Total assist, +2 for physical assistance Sit to supine: Total assist, +2 for physical assistance General bed mobility comments: Pt able to assist minimally  with lifting and lowering trunk from/to bed.  She requires assist with all other aspects.  Pt pushes posteiorly in response to pain     Transfers  General transfer comment: unable to attempt due to orthostasis     Ambulation / Gait / Stairs / Office manager / Balance Dynamic Sitting Balance Sitting balance - Comments: requires UE support and intermittent min A.  Balance Overall balance assessment: Needs assistance Sitting-balance support: Bilateral upper extremity supported Sitting balance-Leahy Scale: Poor Sitting balance - Comments: requires UE support and intermittent min A.  Postural control: Posterior lean    Special needs/care consideration BiPAP/CPAP  N/a CPM  N/a Continuous Drip IV  N/a Dialysis  N/a Life Vest  N/a Oxygen  N/a Special Bed  N/a Trach Size  N/a Wound Vac  N/a Skin left surgical Insidon with staple and dressing Bowel mgmt: incontinent LBM 08/16/16 Bladder mgmt: incontinent Diabetic mgmt yes [name] arabic interpreter needs   Previous Home Environment Living Arrangements: Spouse/significant other  Lives With: Spouse Available Help at Discharge: Family, Available PRN/intermittently (spouse works a second shift job; visits every morning in [place]) Type of Home: Apartment Home Layout: Two level Home Access: Level entry Bathroom Shower/Tub: Tub/shower unit, Architectural technologist: [place]: No Additional Comments: Pt reports she sleeps on a mattress on the floor as her bed is too high to transfer into.  She indicates she is able to maneuver w/c throughout her apt and into the bathroom.  She states there is only one area she cannot access via w/c   Discharge Living Setting Plans for Discharge Living Setting: Patient's home, Lives with (comment), Apartment (spouse) Type of Home at Discharge: Apartment Discharge Home Layout: Two level Discharge Home Access: Level entry Discharge Bathroom Shower/Tub: Tub/shower unit,  Curtain Discharge Bathroom Toilet: Standard Discharge Bathroom Accessibility: Yes How Accessible: Accessible via wheelchair Does the patient have any problems obtaining your medications?: No  Social/Family/Support Systems Patient Roles: Spouse Contact Information: Gilles Chiquito, spouse. He does not speak [place] Anticipated Caregiver: spouse Anticipated Caregiver's Contact Information: see above Ability/Limitations of Caregiver: spouse works evening shift Caregiver Availability: Other (Comment) Discharge Plan Discussed with Primary Caregiver: Yes Is Caregiver In Agreement with Plan?: Yes Does Caregiver/Family have Issues with Lodging/Transportation while Pt is in Rehab?: No   Spouse requesting help to be able to get food stamps again. Also requesting electric wheelchair. They had applied for public housing, but since he became employed, he says he is no longer eligible.  Goals/Additional Needs Patient/Family Goal for Rehab: mod I, superivison to min assist at wheelchair level Expected length of stay: ELOS 14-18 days Cultural Considerations: Muslim; sudanese Special  Service Needs: Interpretor needed Pt/Family Agrees to Admission and willing to participate: Yes Program Orientation Provided & Reviewed with Pt/Caregiver Including Roles  & Responsibilities: Yes  Decrease burden of Care through IP rehab admission: n/a  Possible need for SNF placement upon discharge:not anticipated  Patient Condition: This patient's condition remains as documented in the consult dated 08/18/2016, in which the Rehabilitation Physician determined and documented that the patient's condition is appropriate for intensive rehabilitative care in an inpatient rehabilitation facility. Will admit to inpatient rehab today.  Preadmission Screen Completed By:  Cleatrice Burke, 08/19/2016 12:48 PM ______________________________________________________________________   Discussed status with Dr. Posey Pronto on 08/19/2016 at  1248  and received telephone approval for admission today.  Admission Coordinator:  Cleatrice Burke, time 2567 Date 08/19/2016

## 2016-08-20 ENCOUNTER — Inpatient Hospital Stay (HOSPITAL_COMMUNITY): Payer: Medicare (Managed Care)

## 2016-08-20 ENCOUNTER — Inpatient Hospital Stay (HOSPITAL_COMMUNITY): Payer: Medicare (Managed Care) | Admitting: Physical Therapy

## 2016-08-20 DIAGNOSIS — D72829 Elevated white blood cell count, unspecified: Secondary | ICD-10-CM

## 2016-08-20 DIAGNOSIS — Z89612 Acquired absence of left leg above knee: Secondary | ICD-10-CM

## 2016-08-20 DIAGNOSIS — I10 Essential (primary) hypertension: Secondary | ICD-10-CM

## 2016-08-20 DIAGNOSIS — IMO0002 Reserved for concepts with insufficient information to code with codable children: Secondary | ICD-10-CM

## 2016-08-20 DIAGNOSIS — E1151 Type 2 diabetes mellitus with diabetic peripheral angiopathy without gangrene: Secondary | ICD-10-CM

## 2016-08-20 DIAGNOSIS — D62 Acute posthemorrhagic anemia: Secondary | ICD-10-CM | POA: Clinically undetermined

## 2016-08-20 DIAGNOSIS — Z89511 Acquired absence of right leg below knee: Secondary | ICD-10-CM

## 2016-08-20 DIAGNOSIS — E1165 Type 2 diabetes mellitus with hyperglycemia: Secondary | ICD-10-CM

## 2016-08-20 DIAGNOSIS — E876 Hypokalemia: Secondary | ICD-10-CM

## 2016-08-20 LAB — COMPREHENSIVE METABOLIC PANEL
ALK PHOS: 114 U/L (ref 38–126)
ALT: 14 U/L (ref 14–54)
ANION GAP: 10 (ref 5–15)
AST: 20 U/L (ref 15–41)
Albumin: 1.8 g/dL — ABNORMAL LOW (ref 3.5–5.0)
BUN: 11 mg/dL (ref 6–20)
CALCIUM: 8.3 mg/dL — AB (ref 8.9–10.3)
CHLORIDE: 99 mmol/L — AB (ref 101–111)
CO2: 31 mmol/L (ref 22–32)
Creatinine, Ser: 0.68 mg/dL (ref 0.44–1.00)
Glucose, Bld: 108 mg/dL — ABNORMAL HIGH (ref 65–99)
Potassium: 3 mmol/L — ABNORMAL LOW (ref 3.5–5.1)
SODIUM: 140 mmol/L (ref 135–145)
Total Bilirubin: 0.3 mg/dL (ref 0.3–1.2)
Total Protein: 6.9 g/dL (ref 6.5–8.1)

## 2016-08-20 LAB — GLUCOSE, CAPILLARY
GLUCOSE-CAPILLARY: 105 mg/dL — AB (ref 65–99)
GLUCOSE-CAPILLARY: 229 mg/dL — AB (ref 65–99)
Glucose-Capillary: 112 mg/dL — ABNORMAL HIGH (ref 65–99)
Glucose-Capillary: 212 mg/dL — ABNORMAL HIGH (ref 65–99)

## 2016-08-20 LAB — CBC WITH DIFFERENTIAL/PLATELET
Basophils Absolute: 0 10*3/uL (ref 0.0–0.1)
Basophils Relative: 0 %
EOS ABS: 0.1 10*3/uL (ref 0.0–0.7)
EOS PCT: 1 %
HCT: 29.4 % — ABNORMAL LOW (ref 36.0–46.0)
Hemoglobin: 9 g/dL — ABNORMAL LOW (ref 12.0–15.0)
LYMPHS ABS: 4.5 10*3/uL — AB (ref 0.7–4.0)
Lymphocytes Relative: 32 %
MCH: 24.3 pg — AB (ref 26.0–34.0)
MCHC: 30.6 g/dL (ref 30.0–36.0)
MCV: 79.2 fL (ref 78.0–100.0)
MONOS PCT: 4 %
Monocytes Absolute: 0.5 10*3/uL (ref 0.1–1.0)
Neutro Abs: 8.8 10*3/uL — ABNORMAL HIGH (ref 1.7–7.7)
Neutrophils Relative %: 63 %
PLATELETS: 497 10*3/uL — AB (ref 150–400)
RBC: 3.71 MIL/uL — ABNORMAL LOW (ref 3.87–5.11)
RDW: 16.2 % — ABNORMAL HIGH (ref 11.5–15.5)
WBC: 13.9 10*3/uL — ABNORMAL HIGH (ref 4.0–10.5)

## 2016-08-20 NOTE — Evaluation (Signed)
Physical Therapy Assessment and Plan  Patient Details  Name: Kimberly Lane MRN: [id number] Date of Birth: [birth year]  PT Diagnosis: Difficulty walking and Muscle weakness Rehab Potential: Good ELOS: 2.5-3 weeks.    Today's Date: 08/20/2016 PT Individual Time: 1300-1340 PT Individual Time Calculation (min): 40 min    Problem List:  Patient Active Problem List   Diagnosis Date Noted  . Acute blood loss anemia   . Hypokalemia   . Benign essential HTN   . Uncontrolled diabetes mellitus type 2 with peripheral artery disease ([place])   . Acute diastolic congestive heart failure ([place]) 08/19/2016  . Unilateral AKA, left ([place]) 08/19/2016  . Post-operative pain   . Leukocytosis   . Anxiety state   . S/P AKA (above knee amputation), left ([place]) 08/17/2016  . Acute respiratory failure ([place])   . Dyspnea   . Gangrene due to peripheral vascular disease ([place]) 08/09/2016  . PAD (peripheral artery disease) ([place]) 03/11/2016  . Dysuria 10/24/2014  . Osteomyelitis of left foot ([place])   . Cough   . Hx of right BKA ([place]) 05/26/2014  . S/P BKA (below knee amputation) unilateral, right ([place])   . Abnormal EKG   . Preop cardiovascular exam 05/21/2014  . Cellulitis of toe of right foot   . Peripheral arterial disease ([place])   . Cellulitis 05/13/2014  . Type 2 diabetes mellitus with complication ([place])   . HLD (hyperlipidemia)   . Pure hypercholesterolemia 11/14/2013  . High risk social situation 11/13/2013  . Back pain without radiation 09/05/2011    Class: Acute  . Osteoarthritis of left hip 05/31/2011  . History of primary TB 04/26/2011  . DM (diabetes mellitus), type 2, uncontrolled ([place]) 05/17/2010  . Essential hypertension 12/09/2009    Past Medical History:  Past Medical History:  Diagnosis Date  . Allergy   . Anemia   . Anxiety   . Cellulitis and abscess of foot 05/13/2014   rt foot  . Diabetes mellitus   . Hypertension   . Positive TB test 2013   Past Surgical History:  Past  Surgical History:  Procedure Laterality Date  . ABDOMINAL AORTAGRAM N/A 05/20/2014   Procedure: ABDOMINAL Maxcine Ham;  Surgeon: Serafina Mitchell, MD;  Location[facility] CATH LAB;  Service: Cardiovascular;  Laterality: N/A;  . AMPUTATION Right 05/23/2014   Procedure: AMPUTATION BELOW KNEE;  Surgeon: Mal Misty, MD;  Location: [place];  Service: Vascular;  Laterality: Right;  . AMPUTATION Left 08/09/2016   Procedure: LEFT GREAT TOE AMPUTATION;  Surgeon: Waynetta Sandy, MD;  Location: [place];  Service: Vascular;  Laterality: Left;  . AMPUTATION Left 08/15/2016   Procedure: AMPUTATION ABOVE KNEE;  Surgeon: Rosetta Posner, MD;  Location: [place];  Service: Vascular;  Laterality: Left;  . PERIPHERAL VASCULAR CATHETERIZATION N/A 03/11/2016   Procedure: Abdominal Aortogram w/Lower Extremity;  Surgeon: Elam Dutch, MD;  Location: [place] CV LAB;  Service: Cardiovascular;  Laterality: N/A;  . PERIPHERAL VASCULAR CATHETERIZATION Left 03/11/2016   Procedure: Peripheral Vascular Intervention;  Surgeon: Elam Dutch, MD;  Location: [place] CV LAB;  Service: Cardiovascular;  Laterality: Left;  Superficial femoral    Assessment & Plan Clinical Impression: Patient is a 56 year old [name] woman with uncontrolled T2DM, HTN, PAD s/p R-BKA, PVD  And L-SFA stent and progressive necrotic changes of right great toe with osteomyelitis. Wound cultures positive for few citrobacter freundii and she was treated with ceftriaxone and metronidazole. Hospital course complicate by volume overload 6/23 requiring intubation and  aggressive diuresis. 2 D echo with EF 60-65%, moderate LVH with grade 1 diastolic dysfunction Surgery delayed till stable and she underwent L-AKA on 6/25. She was extubated on 6/26 and respiratory status stable.  Patient transferred to CIR on 08/19/2016 .   Patient currently requires mod with mobility secondary to muscle weakness, decreased cardiorespiratoy endurance and decreased sitting balance  and decreased balance strategies.  Prior to hospitalization, patient was supervision with mobility and lived with Spouse in a [place] home.  Home access is  Level entry.  Patient will benefit from skilled PT intervention to maximize safe functional mobility, minimize fall risk and decrease caregiver burden for planned discharge home with 24 hour assist.  Anticipate patient will benefit from follow up [facility] at discharge.  PT - End of Session Activity Tolerance: Tolerates < 10 min activity with changes in vital signs Endurance Deficit: Yes PT Assessment Rehab Potential (ACUTE/IP ONLY): Good Barriers to Discharge: Decreased caregiver support PT Patient demonstrates impairments in the following area(s): Balance;Behavior;Edema;Endurance;Nutrition;Pain;Safety;Sensory;Skin Integrity PT Transfers Functional Problem(s): Bed Mobility;Bed to Chair;Car;Furniture;Other (comment) PT Locomotion Functional Problem(s): Ambulation;Wheelchair Mobility;Stairs PT Plan PT Intensity: Minimum of 1-2 x/day ,45 to 90 minutes PT Frequency: 5 out of 7 days PT Duration Estimated Length of Stay: 2.5-3 weeks.  PT Treatment/Interventions: Balance/vestibular training;Cognitive remediation/compensation;Community reintegration;Discharge planning;Disease management/prevention;DME/adaptive equipment instruction;Functional electrical stimulation;Functional mobility training;Pain management;Neuromuscular re-education;Patient/family education;Psychosocial support;Skin care/wound management;Therapeutic Exercise;Splinting/orthotics;Stair training;Therapeutic Activities;UE/LE Strength taining/ROM;UE/LE Coordination activities;Wheelchair propulsion/positioning;Visual/perceptual remediation/compensation PT Transfers Anticipated Outcome(s): Supervision -min assist with LRAD  PT Locomotion Anticipated Outcome(s): Supervisoin assist at [facility] level.  PT Recommendation Follow Up Recommendations: Home health PT Patient destination: Home Equipment  Recommended: Wheelchair cushion (measurements);Wheelchair (measurements);Sliding board  Skilled Therapeutic Intervention PT instructed patient in PT Evaluation and initiated treatment intervention; see below for results. PT educated patient in [place], rehab potential, rehab goals, and discharge recommendations.  PT noted to have increased lethargy and orthostatic symptoms. PT assessed BP. Supine: 153/53 sitting. 142/48. Pt unable to perform SB transfer or Posterior transfer at this time due to fatigue. Pt left supine in bed with call bell in reach and all needs met.    PT Evaluation Precautions/Restrictions Precautions Precautions: Fall Restrictions Weight Bearing Restrictions: Yes LLE Weight Bearing: Non weight bearing General   Vital SignsTherapy Vitals BP: (!) 176/77 Patient Position (if appropriate): Sitting Oxygen Therapy SpO2: 97 % O2 Device: Not Delivered Pain Pain Assessment Faces Pain Scale: Hurts a little bit Pain Type: Surgical pain Pain Location: Leg Pain Orientation: Left Home Living/Prior Functioning Home Living Available Help at Discharge: Family;Available PRN/intermittently Type of Home: Apartment Home Access: Level entry Home Layout: Two level Bathroom Shower/Tub: Tub/shower unit;Curtain Bathroom Toilet: Standard Additional Comments: Pt reports she sleeps on a mattress on the floor as her bed is too high to transfer into.  She indicates she is able to maneuver w/c throughout her apt and into the bathroom.  She states there is only one area she cannot access via w/c   Lives With: Spouse Prior Function Level of Independence: Independent with basic ADLs Comments: Pt is from the [place]  Vision/Perception  Vision - Assessment Eye Alignment: Within Functional Limits Ocular Range of Motion: Within Functional Limits Tracking/Visual Pursuits: Able to track stimulus in all quads without difficulty Perception Perception: Within Functional Limits Praxis Praxis: Intact   Cognition Attention: Selective Selective Attention: Appears intact Safety/Judgment: Impaired Sensation Sensation Light Touch: Appears Intact Coordination Gross Motor Movements are Fluid and Coordinated: Yes Fine Motor Movements are Fluid and Coordinated: No Motor  Motor Motor: Within Functional  Limits  Mobility   mod assist rolling L and R. Mod assist to lying to sit. Supervision assist sit>supine.  Locomotion    unable to assist .  Trunk/Postural Assessment  Cervical Assessment Cervical Assessment: Within Functional Limits Thoracic Assessment Thoracic Assessment: Exceptions to [facility] (rounded shoulders) Lumbar Assessment Lumbar Assessment: Exceptions to [facility] (posterior pelvic tilt) Postural Control Postural Control: Within Functional Limits  Balance Balance Balance Assessed: Yes Dynamic Sitting Balance Sitting balance - Comments: requires intermittant min guard A Extremity Assessment  RUE Assessment RUE Assessment: Exceptions to [facility] (generalized weakness) LUE Assessment LUE Assessment: Exceptions to [facility] (generalized weakness) RLE Assessment RLE Assessment: Exceptions to [facility] LLE Assessment LLE Assessment: Exceptions to [facility] (4-/5 at hip due to pain )   See Function Navigator for Current Functional Status.   Refer to Care Plan for Long Term Goals  Recommendations for other services: None   Discharge Criteria: Patient will be discharged from PT if patient refuses treatment 3 consecutive times without medical reason, if treatment goals not met, if there is a change in medical status, if patient makes no progress towards goals or if patient is discharged from hospital.  The above assessment, treatment plan, treatment alternatives and goals were discussed and mutually agreed upon: by patient and by family  Lorie Phenix 08/20/2016, 12:52 PM

## 2016-08-20 NOTE — Progress Notes (Signed)
[facility] PHYSICAL MEDICINE & REHABILITATION     PROGRESS NOTE  Subjective/Complaints:  Pt seen sitting up in bed this AM. Husband at beside.  She complains of stump pain, but does ot appear to have taken pain medications.   ROS: Denies CP, SOB, N/V/D.  Objective: Vital Signs: Blood pressure (!) 167/61, pulse 81, temperature 99 F (37.2 C), temperature source Oral, resp. rate 18, weight 60 kg (132 lb 4.4 oz), SpO2 92 %. No results found.  Recent Labs  08/20/16 0425  WBC 13.9*  HGB 9.0*  HCT 29.4*  PLT 497*    Recent Labs  08/18/16 0558 08/20/16 0425  NA 141 140  K 3.5 3.0*  CL 101 99*  GLUCOSE 79 108*  BUN 10 11  CREATININE 0.75 0.68  CALCIUM 8.2* 8.3*   CBG (last 3)   Recent Labs  08/19/16 1632 08/19/16 2104 08/20/16 0738  GLUCAP 264* 105* 112*    Wt Readings from Last 3 Encounters:  08/20/16 60 kg (132 lb 4.4 oz)  08/19/16 64.8 kg (142 lb 13.7 oz)  04/20/16 63.5 kg (140 lb)    Physical Exam:  BP (!) 167/61 (BP Location: Left Arm)   Pulse 81   Temp 99 F (37.2 C) (Oral)   Resp 18   Wt 60 kg (132 lb 4.4 oz)   SpO2 92%   BMI 24.19 kg/m  Constitutional: She appears well-developed and well-nourished.  HENT: Normocephalic and atraumatic.  Eyes: EOMI. No discharge.   Cardiovascular: Normal rate and regular rhythm.  No JVD. Respiratory: Effort normal and breath sounds normal.  GI: Soft. Bowel sounds are normal.   Musculoskeletal: She exhibits edema and tenderness.  Neurological: She is alert.  Appears disoriented  She is able to follow simple motor commands.    Motor: B/l UE 4+/5 proximal to distal RLE: HF, KE 4+/5 LLE: HF 4/5 (pain inhibition)  Skin: L-AKA with staples in place. Old R-BKA well healed.   Psychiatric: Her mood appears anxious. She is slowed. Cognition and memory are impaired   Assessment/Plan: 1. Functional deficits secondary to left AKA with history of right BKA which require 3+ hours per day of interdisciplinary therapy in a  comprehensive inpatient rehab setting. Physiatrist is providing close team supervision and 24 hour management of active medical problems listed below. Physiatrist and rehab team continue to assess barriers to discharge/monitor patient progress toward functional and medical goals.  Function:  Bathing Bathing position      Bathing parts      Bathing assist        Upper Body Dressing/Undressing Upper body dressing                    Upper body assist        Lower Body Dressing/Undressing Lower body dressing                                  Lower body assist        Toileting Toileting Toileting activity did not occur: No continent bowel/bladder event        Toileting assist     Transfers Chair/bed transfer             Locomotion Ambulation           Wheelchair          Cognition Comprehension Comprehension assist level:  (Does not speak English)  Expression Expression assist level:  Expresses basic 25 - 49% of the time/requires cueing 50 - 75% of the time. Uses single words/gestures.  Social Interaction Social Interaction assist level: Interacts appropriately with others - No medications needed.  Problem Solving    Memory      Medical Problem List and Plan: 1.  Limitations in mobility, transfers, cognition, and self-care secondary to left AKA with history of right BKA.   Begin CIR 2.  DVT Prophylaxis/Anticoagulation: Pharmaceutical: Lovenox 3. Pain Management: D/Ced scheduled hydrocodone.   Added tylenol qid with ultram or oxycodone prn  4. Mood: LCSW to follow for evaluation and support.  5. Neuropsych: This patient is ?capable of making decisions on her own behalf. 6. Skin/Wound Care: monitor wound daily for healing. Change Glucerna (may be contributing to BS elevation)  to prosource for low protein stores.  7. Fluids/Electrolytes/Nutrition: Monitor I/Os 8. T2DM uncontrolled: Hgb A1C- 11.4.  Received decadron by mistake on 6/28  with elevated BS. Monitor BS for with ac/hs CBG checks and anticipate higher readings for 1-2 days.   Will use SSI for elevated BS and titrate meds as indicated. Now on Lantus--will need extensive education.   Extremely labile at present with elevations >400 9. HTN: Monitor BP bid  and titrate meds as indicated. Continue HCTZ, amlodipine and norvasc.   Monitor with increased mobility 10. Acute fluid overload:  Lasix discontinued. Continue hctz with daily weight for monitoring.  11. Nausea/?GERD/poor po intake: On protonix. Will discontinue scheduled hydrocodone as may be contributing to symptoms.  Augment bowel program 12. Leucocytosis: Likely reactive and resolving. Monitor for trend and signs of infection.   WBCs 13.9 on 6/30  Cont to monitor  13. Chest pain/heaviness: Has been having for months and takes 'some medications".   14. Anxiety disorder: Multiple somatic complaints--monitor for now. Team to provide ego support.  15. Hypokalemia  K+ 3.0 on 6/30  Supplementing daily 16. ABLA  Hb 9.0 on 6/30  Cont to monitor  LOS (Days) 1 A FACE TO FACE EVALUATION WAS PERFORMED  Iliana Hutt Lorie Phenix 08/20/2016 8:50 AM

## 2016-08-20 NOTE — Evaluation (Signed)
Occupational Therapy Assessment and Plan  Patient Details  Name: Kimberly Lane MRN: [id number] Date of Birth: 01-08-1961  OT Diagnosis: acute pain, muscle weakness (generalized) and pain in joint Rehab Potential:   ELOS: 14-19 days   Today's Date: 08/20/2016 OT Individual Time: 0800-0900 12-1211 OT Individual Time Calculation (min): 60 min   And 72 min  Problem List:  Patient Active Problem List   Diagnosis Date Noted  . Acute blood loss anemia   . Hypokalemia   . Benign essential HTN   . Uncontrolled diabetes mellitus type 2 with peripheral artery disease ([place])   . Acute diastolic congestive heart failure ([place]) 08/19/2016  . Unilateral AKA, left ([place]) 08/19/2016  . Post-operative pain   . Leukocytosis   . Anxiety state   . S/P AKA (above knee amputation), left ([place]) 08/17/2016  . Acute respiratory failure ([place])   . Dyspnea   . Gangrene due to peripheral vascular disease ([place]) 08/09/2016  . PAD (peripheral artery disease) ([place]) 03/11/2016  . Dysuria 10/24/2014  . Osteomyelitis of left foot ([place])   . Cough   . Hx of right BKA ([place]) 05/26/2014  . S/P BKA (below knee amputation) unilateral, right ([place])   . Abnormal EKG   . Preop cardiovascular exam 05/21/2014  . Cellulitis of toe of right foot   . Peripheral arterial disease ([place])   . Cellulitis 05/13/2014  . Type 2 diabetes mellitus with complication ([place])   . HLD (hyperlipidemia)   . Pure hypercholesterolemia 11/14/2013  . High risk social situation 11/13/2013  . Back pain without radiation 09/05/2011    Class: Acute  . Osteoarthritis of left hip 05/31/2011  . History of primary TB 04/26/2011  . DM (diabetes mellitus), type 2, uncontrolled ([place]) 05/17/2010  . Essential hypertension 12/09/2009    Past Medical History:  Past Medical History:  Diagnosis Date  . Allergy   . Anemia   . Anxiety   . Cellulitis and abscess of foot 05/13/2014   rt foot  . Diabetes mellitus   . Hypertension   . Positive TB test 2013    Past Surgical History:  Past Surgical History:  Procedure Laterality Date  . ABDOMINAL AORTAGRAM N/A 05/20/2014   Procedure: ABDOMINAL Maxcine Ham;  Surgeon: Serafina Mitchell, MD;  Location: [facility] CATH LAB;  Service: Cardiovascular;  Laterality: N/A;  . AMPUTATION Right 05/23/2014   Procedure: AMPUTATION BELOW KNEE;  Surgeon: Mal Misty, MD;  Location: [facility];  Service: Vascular;  Laterality: Right;  . AMPUTATION Left 08/09/2016   Procedure: LEFT GREAT TOE AMPUTATION;  Surgeon: Waynetta Sandy, MD;  Location: [facility];  Service: Vascular;  Laterality: Left;  . AMPUTATION Left 08/15/2016   Procedure: AMPUTATION ABOVE KNEE;  Surgeon: Rosetta Posner, MD;  Location: [facility];  Service: Vascular;  Laterality: Left;  . PERIPHERAL VASCULAR CATHETERIZATION N/A 03/11/2016   Procedure: Abdominal Aortogram w/Lower Extremity;  Surgeon: Elam Dutch, MD;  Location: [facility] CV LAB;  Service: Cardiovascular;  Laterality: N/A;  . PERIPHERAL VASCULAR CATHETERIZATION Left 03/11/2016   Procedure: Peripheral Vascular Intervention;  Surgeon: Elam Dutch, MD;  Location: [facility] CV LAB;  Service: Cardiovascular;  Laterality: Left;  Superficial femoral    Assessment & Plan Clinical Impression:  Kimberly Lane is a 56 year old [name] woman with uncontrolled T2DM, HTN, PAD s/p R-BKA, PVD  And L-SFA stent and progressive necrotic changes of right great toe with osteomyelitis. Wound cultures positive for few citrobacter freundii and she was treated with ceftriaxone and metronidazole.  Hospital course complicate by volume overload 6/23 requiring intubation and aggressive diuresis. 2 D echo with EF 60-65%, moderate LVH with grade 1 diastolic dysfunction Surgery delayed till stable and she underwent L-AKA on 6/25. She was extubated on 6/26 and respiratory status stable. Therapy initiated and CIR recommended due to decline in ability to carry out ADL tasks as well as mobility   Patient currently requires mod- max  with basic self-care skills secondary to muscle weakness, decreased cardiorespiratoy endurance and decreased safety awareness.  Prior to hospitalization, patient could complete BADLs with modified independent .  Patient will benefit from skilled intervention to decrease level of assist with basic self-care skills and increase independence with basic self-care skills prior to discharge home with care partner.  Anticipate patient will require 24 hour supervision and follow up home health.  OT - End of Session Endurance Deficit: Yes Endurance Deficit Description: requires multiple rest breaks 2/2 fatigue and pain OT Assessment Barriers to Discharge: Decreased caregiver support OT Patient demonstrates impairments in the following area(s): Balance;Endurance;Motor;Pain;Safety;Sensory;Skin Integrity OT Basic ADL's Functional Problem(s): Grooming;Bathing;Dressing;Toileting OT Advanced ADL's Functional Problem(s): Simple Meal Preparation OT Transfers Functional Problem(s): Toilet OT Plan OT Intensity: Minimum of 1-2 x/day, 45 to 90 minutes OT Frequency: 5 out of 7 days OT Duration/Estimated Length of Stay: 14-19 days OT Treatment/Interventions: Balance/vestibular training;Discharge planning;Disease Lawyer;Functional mobility training;Pain management;Patient/family education;Psychosocial support;Self Care/advanced ADL retraining;Skin care/wound managment;Therapeutic Activities;Therapeutic Exercise;UE/LE Strength taining/ROM;UE/LE Coordination activities;Wheelchair propulsion/positioning OT Self Feeding Anticipated Outcome(s): MOD I OT Basic Self-Care Anticipated Outcome(s): S OT Toileting Anticipated Outcome(s): S OT Bathroom Transfers Anticipated Outcome(s): S OT Recommendation Recommendations for Other Services: Therapeutic Recreation consult Therapeutic Recreation Interventions: [place] group Patient destination: Home Follow Up Recommendations: Home  health OT Equipment Recommended: To be determined (drop arm commode)   Skilled Therapeutic Intervention 1:1. Husband present throughout session as we as interpreter. Had prolonged discussion about OT role/purpose, evaluation process, CIR, POC and ELOS. Pt reporting pain in L residual limb and dizziness when sitting up. Vitals assessed 176/77 for BP and OT determined not to complete transfers this session 2/2 high BP. RN aware. Pt sits EOB to wash all body parts except bach. OT provides instrucitonal cues for lateral leans to wash buttocks/peri area and MIN A to recover to sitting EOB for trunk facilitation. Pt dons pull over shirt with supervision, and pants with A to advance pants over hips. After attempting to advance pants over hips with lateral leans, pt reports fatigue and requests to rest. OT provides instruction on rolling B with MIN A and OT advances pants past hips. Pt declines grooming at this time 2/2 fatigue. Exited session with pt semi reclined in bed with call light in reach and all needs met.  1:1. Interpreter present. RN measure BP and gives OK to complete transfer Pain reported in RLE; RN aware and administered medication throughout session. Pt requires prolonged seated rest breaks after functional mobility. Pt propels w/c throughout session with supervision and VC for efficient propulsion technique. Pt completes posterior transfer with MOD A with VC for technique and trunk flexion. While seated in w/c, pt brushes teeth with set up. Pt slide board transfer with MAX A for uphill transfer and MOD A downhill with VC for head hip relationship. Pt completes hygiene and OT provides A for clothing management. Pt returns to bed using anterior transfer MAX A with Vc to maintain NWB through LLE.  Pt BP assessed at end of session at 180/77. RN alerted.   OT Evaluation Precautions/Restrictions  Precautions Precautions: Fall Restrictions Weight Bearing Restrictions: Yes LLE Weight Bearing: Non  weight bearing General Chart Reviewed: Yes Vital Signs Therapy Vitals BP: (!) 176/77 Patient Position (if appropriate): Sitting Oxygen Therapy SpO2: 97 % O2 Device: Not Delivered Pain Pain Assessment Faces Pain Scale: Hurts a little bit Pain Type: Surgical pain Pain Location: Leg Pain Orientation: Left Home Living/Prior Functioning Home Living Family/patient expects to be discharged to:: Private residence Living Arrangements: Spouse/significant other Available Help at Discharge: Family, Available PRN/intermittently Type of Home: Apartment Home Access: Level entry Home Layout: Two level Bathroom Shower/Tub: Tub/shower unit, Architectural technologist: Standard Additional Comments: Pt reports she sleeps on a mattress on the floor as her bed is too high to transfer into.  She indicates she is able to maneuver w/c throughout her apt and into the bathroom.  She states there is only one area she cannot access via w/c   Lives With: Spouse Prior Function Level of Independence: Independent with basic ADLs Comments: Pt is from the [place]  ADL   Vision Patient Visual Report: No change from baseline Vision Assessment?: Yes Eye Alignment: Within Functional Limits Perception  Perception: Within Functional Limits Praxis Praxis: Intact Cognition Orientation Level: Person;Situation Year: Other (Comment) (reported "i dont know i am confused") Month: June Day of Week: Correct Immediate Memory Recall: Sock;Blue;Bed Memory Recall: Bed;Sock Memory Recall Sock: With Cue Memory Recall Bed: Without Cue Attention: Selective Selective Attention: Appears intact Safety/Judgment: Impaired Sensation Sensation Light Touch: Appears Intact Coordination Gross Motor Movements are Fluid and Coordinated: Yes Fine Motor Movements are Fluid and Coordinated: No Motor  Motor Motor: Within Functional Limits Mobility  Transfers Transfers:  (no sit to stand 2/2 safety BLE amputation)  Trunk/Postural  Assessment  Postural Control Postural Control: Within Functional Limits  Balance Balance Balance Assessed: Yes Dynamic Sitting Balance Sitting balance - Comments: requires intermittant min guard A Extremity/Trunk Assessment RUE Assessment RUE Assessment: Exceptions to [facility] (generalized weakness) LUE Assessment LUE Assessment: Exceptions to [facility] (generalized weakness)   See Function Navigator for Current Functional Status.   Refer to Care Plan for Long Term Goals  Recommendations for other services: Therapeutic Recreation  Kitchen group   Discharge Criteria: Patient will be discharged from OT if patient refuses treatment 3 consecutive times without medical reason, if treatment goals not met, if there is a change in medical status, if patient makes no progress towards goals or if patient is discharged from hospital.  The above assessment, treatment plan, treatment alternatives and goals were discussed and mutually agreed upon: by patient and by family  Tonny Branch 08/20/2016, 10:18 AM

## 2016-08-21 DIAGNOSIS — R7309 Other abnormal glucose: Secondary | ICD-10-CM

## 2016-08-21 LAB — GLUCOSE, CAPILLARY
GLUCOSE-CAPILLARY: 218 mg/dL — AB (ref 65–99)
Glucose-Capillary: 85 mg/dL (ref 65–99)
Glucose-Capillary: 280 mg/dL — ABNORMAL HIGH (ref 65–99)
Glucose-Capillary: 131 mg/dL — ABNORMAL HIGH (ref 65–99)
Glucose-Capillary: 60 mg/dL — ABNORMAL LOW (ref 65–99)

## 2016-08-21 MED ORDER — CHLORTHALIDONE 50 MG PO TABS
50.0000 mg | ORAL_TABLET | Freq: Every day | ORAL | Status: DC
  Administered 2016-08-21: 50 mg via ORAL
  Filled 2016-08-21 (×2): qty 1

## 2016-08-21 NOTE — Progress Notes (Signed)
[place] PHYSICAL MEDICINE & REHABILITATION     PROGRESS NOTE  Subjective/Complaints:  Patient seen lying in bed this morning. Her husband is at bedside. Limited interaction due to language.  ROS: Denies CP, SOB, N/V/D.  Objective: Vital Signs: Blood pressure (!) 174/79, pulse 79, temperature 98.4 F (36.9 C), temperature source Oral, resp. rate 18, weight 54.4 kg (119 lb 14.9 oz), SpO2 96 %. No results found.  Recent Labs  08/20/16 0425  WBC 13.9*  HGB 9.0*  HCT 29.4*  PLT 497*    Recent Labs  08/20/16 0425  NA 140  K 3.0*  CL 99*  GLUCOSE 108*  BUN 11  CREATININE 0.68  CALCIUM 8.3*   CBG (last 3)   Recent Labs  08/20/16 1636 08/20/16 2126 08/21/16 0712  GLUCAP 105* 229* 218*    Wt Readings from Last 3 Encounters:  08/21/16 54.4 kg (119 lb 14.9 oz)  08/19/16 64.8 kg (142 lb 13.7 oz)  04/20/16 63.5 kg (140 lb)    Physical Exam:  BP (!) 174/79 (BP Location: Right Arm)   Pulse 79   Temp 98.4 F (36.9 C) (Oral)   Resp 18   Wt 54.4 kg (119 lb 14.9 oz)   SpO2 96%   BMI 21.94 kg/m  Constitutional: She appears well-developed and well-nourished.  HENT: Normocephalic and atraumatic.  Eyes: EOMI. No discharge.   Cardiovascular: RRR.  No JVD. Respiratory: Effort normal and breath sounds normal.  GI: Soft. Bowel sounds are normal.   Musculoskeletal: She exhibits edema and tenderness.  Neurological: She is alert.  Appears disoriented  She is able to follow simple motor commands.    Motor: B/l UE 4+/5 proximal to distal RLE: HF, KE 4+/5 LLE: HF 4/5 (pain inhibition, stable)  Skin: L-AKA with staples in place, C/D/I. Old R-BKA well healed.   Psychiatric: She is slowed. Cognition and memory are impaired   Assessment/Plan: 1. Functional deficits secondary to left AKA with history of right BKA which require 3+ hours per day of interdisciplinary therapy in a comprehensive inpatient rehab setting. Physiatrist is providing close team supervision and 24 hour  management of active medical problems listed below. Physiatrist and rehab team continue to assess barriers to discharge/monitor patient progress toward functional and medical goals.  Function:  Bathing Bathing position   Position: Sitting EOB  Bathing parts Body parts bathed by patient: Right arm, Left arm, Left upper leg, Right upper leg, Buttocks, Front perineal area, Abdomen, Chest Body parts bathed by helper: Back  Bathing assist Assist Level: Touching or steadying assistance(Pt > 75%)      Upper Body Dressing/Undressing Upper body dressing   What is the patient wearing?: Pull over shirt/dress     Pull over shirt/dress - Perfomed by patient: Thread/unthread right sleeve, Thread/unthread left sleeve, Put head through opening, Pull shirt over trunk          Upper body assist        Lower Body Dressing/Undressing Lower body dressing   What is the patient wearing?: Pants     Pants- Performed by patient: Thread/unthread right pants leg, Thread/unthread left pants leg Pants- Performed by helper: Pull pants up/down                      Lower body assist Assist for lower body dressing: Touching or steadying assistance (Pt > 75%)      Toileting Toileting Toileting activity did not occur: No continent bowel/bladder event Toileting steps completed by patient: Performs  perineal hygiene Toileting steps completed by helper: Adjust clothing prior to toileting, Adjust clothing after toileting    Toileting assist     Transfers Chair/bed transfer   Chair/bed transfer method: Anterior/posterior Chair/bed transfer assist level: Moderate assist (Pt 50 - 74%/lift or lower) (posterior)       Locomotion Ambulation           Wheelchair          Cognition Comprehension Comprehension assist level:  (Does not speak English)  Expression Expression assist level: Expresses basic 25 - 49% of the time/requires cueing 50 - 75% of the time. Uses single words/gestures.   Social Interaction Social Interaction assist level: Interacts appropriately with others - No medications needed.  Problem Solving    Memory      Medical Problem List and Plan: 1.  Limitations in mobility, transfers, cognition, and self-care secondary to left AKA with history of right BKA.   Continue CIR 2.  DVT Prophylaxis/Anticoagulation: Pharmaceutical: Lovenox 3. Pain Management: D/Ced scheduled hydrocodone.   Added tylenol qid with ultram or oxycodone prn  4. Mood: LCSW to follow for evaluation and support.  5. Neuropsych: This patient is ?capable of making decisions on her own behalf. 6. Skin/Wound Care: monitor wound daily for healing. Change Glucerna (may be contributing to BS elevation)  to prosource for low protein stores.  7. Fluids/Electrolytes/Nutrition: Monitor I/Os 8. T2DM uncontrolled: Hgb A1C- 11.4.  Received decadron by mistake on 6/28 with elevated BS. Monitor BS for with ac/hs CBG checks and anticipate higher readings for 1-2 days.   Will use SSI for elevated BS and titrate meds as indicated. Now on Lantus--will need extensive education.   Remains labile on 7/1 9. HTN: Monitor BP bid  and titrate meds as indicated.  HCTZ DC'd and Chlorthalidone 50 mg started on 7/1  Cont norvasc.   Monitor with increased mobility 10. Acute fluid overload:  Lasix discontinued. Continue hctz with daily weight for monitoring.  11. Nausea/?GERD/poor po intake: On protonix. Will discontinue scheduled hydrocodone as may be contributing to symptoms.  Augment bowel program 12. Leucocytosis: Likely reactive and resolving. Monitor for trend and signs of infection.   WBCs 13.9 on 6/30  Labs ordered for tomorrow  Cont to monitor  13. Chest pain/heaviness: Has been having for months and takes 'some medications".   14. Anxiety disorder: Multiple somatic complaints--monitor for now. Team to provide ego support.  15. Hypokalemia  K+ 3.0 on 6/30  Supplementing daily  Labs ordered for tomorrow 16.  ABLA  Hb 9.0 on 6/30  Labs ordered for tomorrow  Cont to monitor  LOS (Days) 2 A FACE TO FACE EVALUATION WAS PERFORMED  Carley Glendenning Lorie Phenix 08/21/2016 8:33 AM

## 2016-08-22 ENCOUNTER — Encounter: Payer: Self-pay | Admitting: Vascular Surgery

## 2016-08-22 ENCOUNTER — Inpatient Hospital Stay (HOSPITAL_COMMUNITY): Payer: Medicaid Other | Admitting: Physical Therapy

## 2016-08-22 ENCOUNTER — Inpatient Hospital Stay (HOSPITAL_COMMUNITY): Payer: Medicaid Other | Admitting: Occupational Therapy

## 2016-08-22 ENCOUNTER — Inpatient Hospital Stay (HOSPITAL_COMMUNITY): Payer: No Typology Code available for payment source | Admitting: Occupational Therapy

## 2016-08-22 ENCOUNTER — Encounter (HOSPITAL_COMMUNITY): Payer: Self-pay | Admitting: *Deleted

## 2016-08-22 ENCOUNTER — Telehealth: Payer: Self-pay | Admitting: Vascular Surgery

## 2016-08-22 LAB — BASIC METABOLIC PANEL
Anion gap: 6 (ref 5–15)
BUN: 13 mg/dL (ref 6–20)
CHLORIDE: 101 mmol/L (ref 101–111)
CO2: 30 mmol/L (ref 22–32)
Calcium: 8.2 mg/dL — ABNORMAL LOW (ref 8.9–10.3)
Creatinine, Ser: 0.69 mg/dL (ref 0.44–1.00)
GFR calc Af Amer: >60 mL/min (ref >60)
GFR calc non Af Amer: >60 mL/min (ref >60)
Glucose, Bld: 237 mg/dL — ABNORMAL HIGH (ref 65–99)
Potassium: 3.8 mmol/L (ref 3.5–5.1)
Sodium: 137 mmol/L (ref 135–145)

## 2016-08-22 LAB — GLUCOSE, CAPILLARY
GLUCOSE-CAPILLARY: 312 mg/dL — AB (ref 65–99)
Glucose-Capillary: 202 mg/dL — ABNORMAL HIGH (ref 65–99)
Glucose-Capillary: 283 mg/dL — ABNORMAL HIGH (ref 65–99)
Glucose-Capillary: 86 mg/dL (ref 65–99)

## 2016-08-22 LAB — CBC WITH DIFFERENTIAL/PLATELET
Basophils Absolute: 0 10*3/uL (ref 0.0–0.1)
Basophils Relative: 0 %
EOS ABS: 0.1 10*3/uL (ref 0.0–0.7)
Eosinophils Relative: 2 %
HEMATOCRIT: 32.2 % — AB (ref 36.0–46.0)
HEMOGLOBIN: 9.9 g/dL — AB (ref 12.0–15.0)
LYMPHS ABS: 3.1 10*3/uL (ref 0.7–4.0)
Lymphocytes Relative: 42 %
MCH: 24 pg — AB (ref 26.0–34.0)
MCHC: 30.7 g/dL (ref 30.0–36.0)
MCV: 78 fL (ref 78.0–100.0)
MONOS PCT: 7 %
Monocytes Absolute: 0.5 10*3/uL (ref 0.1–1.0)
NEUTROS PCT: 49 %
Neutro Abs: 3.7 10*3/uL (ref 1.7–7.7)
Platelets: 497 10*3/uL — ABNORMAL HIGH (ref 150–400)
RBC: 4.13 MIL/uL (ref 3.87–5.11)
RDW: 16.5 % — ABNORMAL HIGH (ref 11.5–15.5)
WBC: 7.4 10*3/uL (ref 4.0–10.5)

## 2016-08-22 MED ORDER — POTASSIUM CHLORIDE CRYS ER 10 MEQ PO TBCR
10.0000 meq | EXTENDED_RELEASE_TABLET | Freq: Every day | ORAL | Status: DC
  Administered 2016-08-23 – 2016-08-30 (×8): 10 meq via ORAL
  Filled 2016-08-22 (×8): qty 1

## 2016-08-22 MED ORDER — CHLORTHALIDONE 50 MG PO TABS
100.0000 mg | ORAL_TABLET | Freq: Every day | ORAL | Status: DC
  Administered 2016-08-23 – 2016-09-01 (×10): 100 mg via ORAL
  Filled 2016-08-22 (×10): qty 2

## 2016-08-22 MED ORDER — CHLORTHALIDONE 25 MG PO TABS
25.0000 mg | ORAL_TABLET | Freq: Once | ORAL | Status: AC
  Administered 2016-08-22: 25 mg via ORAL
  Filled 2016-08-22: qty 1

## 2016-08-22 NOTE — Progress Notes (Signed)
Patient information reviewed and entered into eRehab system by Sharonda Llamas, RN, CRRN, PPS Coordinator.  Information including medical coding and functional independence measure will be reviewed and updated through discharge.     Per nursing patient was given "Data Collection Information Summary for Patients in Inpatient Rehabilitation Facilities with attached "Privacy Act Statement-Health Care Records" upon admission.  

## 2016-08-22 NOTE — Care Management (Signed)
[facility] Individual Statement of Services  Patient Name:  Kimberly Lane  Date:  08/22/2016  Welcome to the [facility].  Our goal is to provide you with an individualized program based on your diagnosis and situation, designed to meet your specific needs.  With this comprehensive rehabilitation program, you will be expected to participate in at least 3 hours of rehabilitation therapies Monday-Friday, with modified therapy programming on the weekends.  Your rehabilitation program will include the following services:  Physical Therapy (PT), Occupational Therapy (OT), 24 hour per day rehabilitation nursing, Case Management (Social Worker), Rehabilitation Medicine, Nutrition Services and Pharmacy Services  Weekly team conferences will be held on Wednesday to discuss your progress.  Your Social Worker will talk with you frequently to get your input and to update you on team discussions.  Team conferences with you and your family in attendance may also be held.  Expected length of stay: 2.5 -3 weeks  Overall anticipated outcome: supervision set up  Depending on your progress and recovery, your program may change. Your Social Worker will coordinate services and will keep you informed of any changes. Your Social Worker's name and contact numbers are listed  below.  The following services may also be recommended but are not provided by the [facility]:   [place] will be made to provide these services after discharge if needed.  Arrangements include referral to agencies that provide these services.  Your insurance has been verified to be:  [employer] [place] [employer] Medicaid Your primary doctor is:  Dorian Pod  Pertinent information will be shared with your doctor and your insurance company.  Social Worker:  Ovidio Kin, [place] or ([phone]  Information discussed with and copy given to patient by: Elease Hashimoto, 08/22/2016, 12:26 PM

## 2016-08-22 NOTE — Progress Notes (Signed)
Occupational Therapy Session Note  Patient Details  Name: Kimberly Lane MRN: [id number] Date of Birth: November 28, 1960  Today's Date: 08/22/2016 OT Individual Time: 1350-1440 OT Individual Time Calculation (min): 50 min    Short Term Goals: Week 1:  OT Short Term Goal 1 (Week 1): Pt will transfer to DAC/3 in 1 with MOD A OT Short Term Goal 2 (Week 1): Pt will complete 2/3 steps of toileting on BSC with touching A OT Short Term Goal 3 (Week 1): Pt will don pants with touching A using lateral leans  OT Short Term Goal 4 (Week 1): Pt will complete 5 min of functional task without rest break to improve endurance required for BADLs  Skilled Therapeutic Interventions/Progress Updates:    Pt greeted via PT handoff in room. Spouse and interpretor present. Pt with increased pain in residual limb, unable to report pain level ("I don't know"). Educated her on massaging/tapping limb with visual cues for technique carryover. Pt not due for pain medication at this time. Pt self propelled to kitchen for UB strengthening. Went over w/c level meal prep, had her retrieve items from refridgerator and place them on counter. Discussed kitchen modifications to implement in order to increase independence with meal prep. Reviewed her home layout. Pt reporting that she does enjoy cooking, but now that she felt "different in (her) body" she has no desire to be more independent with cooking/IADL tasks. Pt expressing depressive thoughts, that she no longer knew what she enjoyed and that she was now a "new person" after the AKA. Pt holding head in hand while conversing with OT through interpretor. When inquired about talking about this to others at the hospital, she reported "I begin to speak but they stop me and don't listen." Pt provided therapeutic listening. Gently encouraged her to remain positive, educated her that she can still live a very meaningful life w/c level, and that therapists here at [facility] would assist her in being as  functional as she wanted to be at d/c. Reminded her of having strong support from husband. Pt appeared to be receptive to listening/discussion. Escorted her back to room due to fatigue level. Max A AP transfer back to bed. BP 160/65. Pt left with all needs and spouse at time of departure.   Recommended psych consult to RN. Talked about POC altercations the interprofessional team can make to address pts psychosocial needs.   Therapy Documentation Precautions:  Precautions Precautions: Fall Restrictions Weight Bearing Restrictions: Yes RLE Weight Bearing: Non weight bearing LLE Weight Bearing: Non weight bearing Vital Signs: Therapy Vitals Temp: 98.9 F (37.2 C) Temp Source: Oral Pulse Rate: 87 Resp: 16 BP: 106/65 Patient Position (if appropriate): Sitting Oxygen Therapy SpO2: 99 % O2 Device: Not Delivered Pain: Pt unable to rate pain with number    ADL:      See Function Navigator for Current Functional Status.   Therapy/Group: Individual Therapy  Jerrine Urschel A Chivonne Rascon 08/22/2016, 4:01 PM

## 2016-08-22 NOTE — Progress Notes (Signed)
Social Work Assessment and Plan Social Work Assessment and Plan  Patient Details  Name: Kimberly Lane MRN: [id number] Date of Birth: 1960/08/03  Today's Date: 08/22/2016  Problem List:  Patient Active Problem List   Diagnosis Date Noted  . Labile blood glucose   . Acute blood loss anemia   . Hypokalemia   . Benign essential HTN   . Uncontrolled diabetes mellitus type 2 with peripheral artery disease ([place])   . Acute diastolic congestive heart failure ([place]) 08/19/2016  . Unilateral AKA, left ([place]) 08/19/2016  . Post-operative pain   . Leukocytosis   . Anxiety state   . S/P AKA (above knee amputation), left ([place]) 08/17/2016  . Acute respiratory failure ([place])   . Dyspnea   . Gangrene due to peripheral vascular disease ([place]) 08/09/2016  . PAD (peripheral artery disease) ([place]) 03/11/2016  . Dysuria 10/24/2014  . Osteomyelitis of left foot ([place])   . Cough   . Hx of right BKA ([place]) 05/26/2014  . S/P BKA (below knee amputation) unilateral, right ([place])   . Abnormal EKG   . Preop cardiovascular exam 05/21/2014  . Cellulitis of toe of right foot   . Peripheral arterial disease ([place])   . Cellulitis 05/13/2014  . Type 2 diabetes mellitus with complication ([place])   . HLD (hyperlipidemia)   . Pure hypercholesterolemia 11/14/2013  . High risk social situation 11/13/2013  . Back pain without radiation 09/05/2011    Class: Acute  . Osteoarthritis of left hip 05/31/2011  . History of primary TB 04/26/2011  . DM (diabetes mellitus), type 2, uncontrolled ([place]) 05/17/2010  . Essential hypertension 12/09/2009   Past Medical History:  Past Medical History:  Diagnosis Date  . Allergy   . Anemia   . Anxiety   . Cellulitis and abscess of foot 05/13/2014   rt foot  . Diabetes mellitus   . Hypertension   . Positive TB test 2013   Past Surgical History:  Past Surgical History:  Procedure Laterality Date  . ABDOMINAL AORTAGRAM N/A 05/20/2014   Procedure: ABDOMINAL Maxcine Ham;  Surgeon: Serafina Mitchell, MD;  Location: [facility] CATH LAB;  Service: Cardiovascular;  Laterality: N/A;  . AMPUTATION Right 05/23/2014   Procedure: AMPUTATION BELOW KNEE;  Surgeon: Mal Misty, MD;  Location: [facility];  Service: Vascular;  Laterality: Right;  . AMPUTATION Left 08/09/2016   Procedure: LEFT GREAT TOE AMPUTATION;  Surgeon: Waynetta Sandy, MD;  Location: [facility];  Service: Vascular;  Laterality: Left;  . AMPUTATION Left 08/15/2016   Procedure: AMPUTATION ABOVE KNEE;  Surgeon: Rosetta Posner, MD;  Location: [facility];  Service: Vascular;  Laterality: Left;  . PERIPHERAL VASCULAR CATHETERIZATION N/A 03/11/2016   Procedure: Abdominal Aortogram w/Lower Extremity;  Surgeon: Elam Dutch, MD;  Location: [facility] CV LAB;  Service: Cardiovascular;  Laterality: N/A;  . PERIPHERAL VASCULAR CATHETERIZATION Left 03/11/2016   Procedure: Peripheral Vascular Intervention;  Surgeon: Elam Dutch, MD;  Location: [facility] CV LAB;  Service: Cardiovascular;  Laterality: Left;  Superficial femoral   Social History:  reports that she has never smoked. She has never used smokeless tobacco. She reports that she does not drink alcohol or use drugs.  Family / Support Systems Marital Status: Married Patient Roles: Spouse Spouse/Significant Other: Gilles Chiquito [phone] Other Supports: Dannette Barbara [phone] Altamese Dilling for [place] [phone] Anticipated Caregiver: Husband and [facility] Ability/Limitations of Caregiver: Husband works evening shift Caregiver Availability: Other (Comment) (Piecing together care-between [employer] and husband) Family Dynamics:  Close with family and freinds. Feel can work on a plan that will be safe for pt and provide for her needs. She has been managing prior to admission from wheelchair level.  Social History Preferred language: Arabic,Sudanese Religion: Muslim Cultural Background: [name] been here since 2012 Education: Have taken English classes  but English is very limited-husband understands more than pt Read: Yes (arabic) Write: Yes (arabic) Employment Status: Unemployed Freight forwarder Issues: No issues Guardian/Conservator: none-according to MD pt is capable of amking her own decisions while here.   Abuse/Neglect Physical Abuse: Denies Verbal Abuse: Denies Sexual Abuse: Denies Exploitation of patient/patient's resources: Denies Self-Neglect: Denies  Emotional Status Pt's affect, behavior adn adjustment status: Pt is very tired from therapies, wanted to rest and be better prepared for her next therapy. She is familiar with the rehab unit and wants to do while here. Her husband is here every am prior to going to work.  Recent Psychosocial Issues: Other health issues-past amputation in 2016 Pyschiatric History: No issues difficult to assess due to tired and very quiet. Will continue to assess while here and see if neuro-psych would be benefical while here. Substance Abuse History: No issues  Patient / Family Perceptions, Expectations & Goals Pt/Family understanding of illness & functional limitations: Husband reports pt wants to do well and get home. Both have spoken with the MD and feel like they have a basic understanding of the plan.  Premorbid pt/family roles/activities: Wife, friend, etc Anticipated changes in roles/activities/participation: resume Pt/family expectations/goals: Pt states: " I want to do well here."  Husband states: " I hope she does well and can be home soon."  [employer]: Other (Comment) (Involved with the [facility]) Premorbid Home Care/DME Agencies: Other (Comment) (has equipment from previous surgeries) Transportation available at discharge: [employer] and husband Resource referrals recommended: Support group (specify)  Discharge Planning Living Arrangements: Spouse/significant other Support Systems: Spouse/significant other, Friends/neighbors,  Social worker community Type of Residence: Private residence Insurance Resources: Information systems manager, [employer] (specify county) Pensions consultant: Family Support, SSI Financial Screen Referred: No Living Expenses: Education officer, community Management: Spouse Does the patient have any problems obtaining your medications?: No Home Management: Pt what she can and husband assists Patient/Family Preliminary Plans: Return home with her husband and go to the [place] program M-TH 9-1 pm. WIll work with both pt/husband and Customer service manager to work on pt's needs at discharge.  Social Work Anticipated Follow Up Needs: HH/OP, Support Group  Clinical Impression Met with pt and husband with interpreter present to obtain information and complete assessment. Both are pleased she is here on the rehab unit, last time she went to [facility] for her recovery. Husband plans to be here in the mornings before going to work to see her in therapies and see how she is doing. Will work with [employer] and pt/husband on best plan for her and any needs she has prior to discharge home. Pt is interested in getting a power chair.  Elease Hashimoto 08/22/2016, 2:05 PM

## 2016-08-22 NOTE — Progress Notes (Signed)
Physical Therapy Session Note  Patient Details  Name: Kimberly Lane MRN: [id number] Date of Birth: [birth year]  Today's Date: 08/22/2016 PT Individual Time: 1130-1200 PT Individual Time Calculation (min): 30 min   Short Term Goals: Week 1:  PT Short Term Goal 1 (Week 1): Pt will perform SB transfer with mod assist consistently PT Short Term Goal 2 (Week 1): Pt will perform bed mobility with min assist PT Short Term Goal 3 (Week 1): Pt will propell WC 162ft with min assist  PT Short Term Goal 4 (Week 1): Pt will be able to maintian sitting balance with supervision assist x 5 minutes without orthostasis.   Skilled Therapeutic Interventions/Progress Updates: Interpreter used throughout session. Pt denies pain and agreeable to treatment. Supine>sit with husband ultimately providing minA per pt request after slow initiation. A/P transfer to w/c with minA from therapist to scoot hips. W/c propulsion x50' with BUE and S; increased time required and pt fatigues quickly. Performed UE ergometer x8 min total forward/backward varies per pt preference. Therapist obtained new RLE amputee pad as previous one was too long for R residual limb to reach. W/c propulsion x50' with BUE and S; pt again fatigues quickly. Remained seated in w/c at end of session, husband present and all needs in reach.      Therapy Documentation Precautions:  Precautions Precautions: Fall Restrictions Weight Bearing Restrictions: Yes RLE Weight Bearing: Non weight bearing LLE Weight Bearing: Non weight bearing   See Function Navigator for Current Functional Status.   Therapy/Group: Individual Therapy  Luberta Mutter 08/22/2016, 12:02 PM

## 2016-08-22 NOTE — Progress Notes (Signed)
Occupational Therapy Session Note  Patient Details  Name: Kimberly Lane MRN: [id number] Date of Birth: 02-13-1961  Today's Date: 08/22/2016 OT Individual Time: 0900-1000 OT Individual Time Calculation (min): 60 min    Short Term Goals: Week 1:  OT Short Term Goal 1 (Week 1): Pt will transfer to DAC/3 in 1 with MOD A OT Short Term Goal 2 (Week 1): Pt will complete 2/3 steps of toileting on BSC with touching A OT Short Term Goal 3 (Week 1): Pt will don pants with touching A using lateral leans  OT Short Term Goal 4 (Week 1): Pt will complete 5 min of functional task without rest break to improve endurance required for BADLs  Skilled Therapeutic Interventions/Progress Updates:    With the assistance of a translator, pt seen for ADL training. Pt's spouse present and with many questions regarding when she could have a L prosthesis, if she can get a power w/c. He stated that previously she could ambulate with R prosthesis and a RW but was not strong enough to push her own w/c. He will bring in the R prosthesis from home.  Pt states that since the surgery she has had visually blurriness and pain and numbness in LUE as well as entire L side.   Pt was in pain but did participate with extra time needed to transition through movements.     Blood pressure supine 168/65. Once in sitting 180/90.  Pt worked on bed mobility for LB bathing and dressing, sat with steady balance at EOB for UB self care.  She used slide board to transfer to L to w/c with max A as she was leaning posteriorly.    Pt completed oral care at sink.   Pt resting in w/c with spouse in room with pt.   Therapy Documentation Precautions:  Precautions Precautions: Fall Restrictions Weight Bearing Restrictions: Yes RLE Weight Bearing: Non weight bearing LLE Weight Bearing: Non weight bearing      Pain: Pain Assessment Pain Assessment: Faces Faces Pain Scale: Hurts whole lot Pain Type: Surgical pain Pain Location:  Leg Pain Descriptors / Indicators: Aching Pain Onset: With Activity ADL:   See Function Navigator for Current Functional Status.   Therapy/Group: Individual Therapy  Zilphia Kozinski 08/22/2016, 11:51 AM

## 2016-08-22 NOTE — Plan of Care (Signed)
Problem: RH PAIN MANAGEMENT Goal: RH STG PAIN MANAGED AT OR BELOW PT'S PAIN GOAL Outcome: Progressing 2/10

## 2016-08-22 NOTE — Progress Notes (Signed)
Physical Therapy Session Note  Patient Details  Name: Kimberly Lane MRN: [id number] Date of Birth: 1960-11-12  Today's Date: 08/22/2016 PT Individual Time: 5615-3794 PT Individual Time Calculation (min): 45 min   Short Term Goals: Week 1:  PT Short Term Goal 1 (Week 1): Pt will perform SB transfer with mod assist consistently PT Short Term Goal 2 (Week 1): Pt will perform bed mobility with min assist PT Short Term Goal 3 (Week 1): Pt will propell WC 151ft with min assist  PT Short Term Goal 4 (Week 1): Pt will be able to maintian sitting balance with supervision assist x 5 minutes without orthostasis.   Skilled Therapeutic Interventions/Progress Updates: Pt presented in bed agreeable to therapy. Pt performed rolling L/R minA to don pants. PTA threaded pants total assist. Pt c/o increased pain in residual limb nsg notified. Performed a-p transfer bed to w/c, pt requiring modA to align with w/c. Transported to rehab gym for energy conservation. Performed A-P transfer to mat minA.Performed weighted ball exercises to tolerance for UE strengthening and unsupported balance. Use of 1kg weighted ball shoulder flex/ext x 5, chest press x 5 with pt limited by fatigue. Pt request to return to w/c due to fatigue and increased pain in L residual limb. Performed A-P transfer to w/c with min guard and improved technique. Pt transported back to room and remained in w/c awaiting OT for next session.      Therapy Documentation Precautions:  Precautions Precautions: Fall Restrictions Weight Bearing Restrictions: Yes RLE Weight Bearing: Non weight bearing LLE Weight Bearing: Non weight bearing General:   Vital Signs: Therapy Vitals Temp: 98.9 F (37.2 C) Temp Source: Oral Pulse Rate: 87 Resp: 16 BP: 106/65 Patient Position (if appropriate): Sitting Oxygen Therapy SpO2: 99 % O2 Device: Not Delivered   See Function Navigator for Current Functional Status.   Therapy/Group: Individual  Therapy  Colm Lyford  Tanis Burnley, PTA  08/22/2016, 4:57 PM

## 2016-08-22 NOTE — Telephone Encounter (Signed)
-----   Message from Mena Goes, RN sent at 08/19/2016  9:46 AM EDT ----- Regarding: 2-3 weeks   ----- Message ----- From: Waynetta Sandy, MD Sent: 08/19/2016   9:26 AM To: Vvs Charge Pool  F/u 2-3 weeks for staple removal

## 2016-08-22 NOTE — Progress Notes (Signed)
[facility] PHYSICAL MEDICINE & REHABILITATION     PROGRESS NOTE  Subjective/Complaints:  Pt seen laying in bed this AM.  She slept well overnight.  She had hypoglycemia overnight.  Per nursing, pt refusing food due to taste.   ROS: Denies CP, SOB, N/V/D.  Objective: Vital Signs: Blood pressure (!) 177/72, pulse 78, temperature 98.9 F (37.2 C), temperature source Oral, resp. rate 16, weight 55.3 kg (121 lb 14.6 oz), SpO2 96 %. No results found.  Recent Labs  08/20/16 0425 08/22/16 0437  WBC 13.9* 7.4  HGB 9.0* 9.9*  HCT 29.4* 32.2*  PLT 497* 497*    Recent Labs  08/20/16 0425 08/22/16 0437  NA 140 137  K 3.0* 3.8  CL 99* 101  GLUCOSE 108* 237*  BUN 11 13  CREATININE 0.68 0.69  CALCIUM 8.3* 8.2*   CBG (last 3)   Recent Labs  08/21/16 2132 08/21/16 2229 08/22/16 0642  GLUCAP 60* 131* 202*    Wt Readings from Last 3 Encounters:  08/22/16 55.3 kg (121 lb 14.6 oz)  08/19/16 64.8 kg (142 lb 13.7 oz)  04/20/16 63.5 kg (140 lb)    Physical Exam:  BP (!) 177/72 (BP Location: Left Arm)   Pulse 78   Temp 98.9 F (37.2 C) (Oral)   Resp 16   Wt 55.3 kg (121 lb 14.6 oz)   SpO2 96%   BMI 22.30 kg/m  Constitutional: She appears well-developed and well-nourished.  HENT: Normocephalic and atraumatic.  Eyes: EOMI. No discharge.   Cardiovascular: RRR.  No JVD. Respiratory: Effort normal and breath sounds normal.  GI: Soft. Bowel sounds are normal.   Musculoskeletal: She exhibits edema and tenderness.  Neurological: She is alert.  She is able to follow simple motor commands.    Motor: B/l UE 4+/5 proximal to distal RLE: HF, KE 4+/5 (stable) LLE: HF 4/5 (pain inhibition, stable)  Skin: L-AKA with staples in place, C/D/I. Old R-BKA well healed.   Psychiatric: She is slowed. Cognition and memory are impaired   Assessment/Plan: 1. Functional deficits secondary to left AKA with history of right BKA which require 3+ hours per day of interdisciplinary therapy in a  comprehensive inpatient rehab setting. Physiatrist is providing close team supervision and 24 hour management of active medical problems listed below. Physiatrist and rehab team continue to assess barriers to discharge/monitor patient progress toward functional and medical goals.  Function:  Bathing Bathing position   Position: Sitting EOB  Bathing parts Body parts bathed by patient: Right arm, Left arm, Left upper leg, Right upper leg, Buttocks, Front perineal area, Abdomen, Chest Body parts bathed by helper: Back  Bathing assist Assist Level: Touching or steadying assistance(Pt > 75%)      Upper Body Dressing/Undressing Upper body dressing   What is the patient wearing?: Pull over shirt/dress     Pull over shirt/dress - Perfomed by patient: Thread/unthread right sleeve, Thread/unthread left sleeve, Put head through opening, Pull shirt over trunk          Upper body assist        Lower Body Dressing/Undressing Lower body dressing   What is the patient wearing?: Pants     Pants- Performed by patient: Thread/unthread right pants leg, Thread/unthread left pants leg Pants- Performed by helper: Pull pants up/down                      Lower body assist Assist for lower body dressing: Touching or steadying assistance (Pt >  75%)      Toileting Toileting Toileting activity did not occur: No continent bowel/bladder event Toileting steps completed by patient: Performs perineal hygiene Toileting steps completed by helper: Adjust clothing after toileting, Performs perineal hygiene Toileting Assistive Devices: Toilet aid  Toileting assist Assist level: Two helpers   Transfers Chair/bed transfer   Chair/bed transfer method: Anterior/posterior Chair/bed transfer assist level: Moderate assist (Pt 50 - 74%/lift or lower) (posterior)       Locomotion Ambulation           Wheelchair          Cognition Comprehension Comprehension assist level: Follows complex  conversation/direction with no assist  Expression Expression assist level: Expresses basic 25 - 49% of the time/requires cueing 50 - 75% of the time. Uses single words/gestures.  Social Interaction Social Interaction assist level: Interacts appropriately with others with medication or extra time (anti-anxiety, antidepressant).  Problem Solving    Memory      Medical Problem List and Plan: 1.  Limitations in mobility, transfers, cognition, and self-care secondary to left AKA with history of right BKA.   Continue CIR 2.  DVT Prophylaxis/Anticoagulation: Pharmaceutical: Lovenox 3. Pain Management: D/Ced scheduled hydrocodone.   Added tylenol qid with ultram or oxycodone prn  4. Mood: LCSW to follow for evaluation and support.  5. Neuropsych: This patient is ?capable of making decisions on her own behalf. 6. Skin/Wound Care: monitor wound daily for healing. Change Glucerna (may be contributing to BS elevation)  to prosource for low protein stores.  7. Fluids/Electrolytes/Nutrition: Monitor I/Os 8. T2DM uncontrolled: Hgb A1C- 11.4.  Received decadron by mistake on 6/28 with elevated BS. Monitor BS for with ac/hs CBG checks  Will use SSI for elevated BS and titrate meds as indicated. Now on Lantus--will need extensive education.   Extremely labile with hypoglycemia yesterday, inconsistent diet 9. HTN: Monitor BP bid  and titrate meds as indicated.  HCTZ DC'd and Chlorthalidone 50 mg started on 7/1, increased on 7/2  Cont norvasc.   Monitor with increased mobility 10. Acute fluid overload:  Lasix discontinued. Continue hctz with daily weight for monitoring.  11. Nausea/?GERD/poor po intake: On protonix. Will discontinue scheduled hydrocodone as may be contributing to symptoms.  Augment bowel program 12. Leucocytosis: Resolved  Likely reactive and resolving. Monitor for trend and signs of infection.   Cont to monitor  13. Chest pain/heaviness: Has been having for months and takes 'some  medications".   14. Anxiety disorder: Multiple somatic complaints--monitor for now. Team to provide ego support.  15. Hypokalemia  K+ 3.8 on 7/2  Supplementing daily, decreased dose on 7/2 16. ABLA  Hb 9.9 on 7/2  Cont to monitor  LOS (Days) 3 A FACE TO FACE EVALUATION WAS PERFORMED  Ankit Lorie Phenix 08/22/2016 9:11 AM

## 2016-08-22 NOTE — Progress Notes (Signed)
Pts appetite poor relating to food preferences.  Dietician aware.

## 2016-08-22 NOTE — Telephone Encounter (Signed)
Sched appt 09/02/16 at 11:45. Spoke to pt, I don't think the pt fully understood me. Mailed appt letter through regular mail.

## 2016-08-23 ENCOUNTER — Inpatient Hospital Stay (HOSPITAL_COMMUNITY): Payer: Medicaid Other | Admitting: Physical Therapy

## 2016-08-23 ENCOUNTER — Inpatient Hospital Stay (HOSPITAL_COMMUNITY): Payer: No Typology Code available for payment source | Admitting: Occupational Therapy

## 2016-08-23 LAB — GLUCOSE, CAPILLARY
GLUCOSE-CAPILLARY: 104 mg/dL — AB (ref 65–99)
GLUCOSE-CAPILLARY: 144 mg/dL — AB (ref 65–99)
GLUCOSE-CAPILLARY: 240 mg/dL — AB (ref 65–99)
Glucose-Capillary: 227 mg/dL — ABNORMAL HIGH (ref 65–99)

## 2016-08-23 LAB — CBC
HEMATOCRIT: 32.8 % — AB (ref 36.0–46.0)
HEMOGLOBIN: 10.1 g/dL — AB (ref 12.0–15.0)
MCH: 24.2 pg — AB (ref 26.0–34.0)
MCHC: 30.8 g/dL (ref 30.0–36.0)
MCV: 78.7 fL (ref 78.0–100.0)
Platelets: 490 10*3/uL — ABNORMAL HIGH (ref 150–400)
RBC: 4.17 MIL/uL (ref 3.87–5.11)
RDW: 16.4 % — AB (ref 11.5–15.5)
WBC: 7 10*3/uL (ref 4.0–10.5)

## 2016-08-23 MED ORDER — HYDRALAZINE HCL 10 MG PO TABS
10.0000 mg | ORAL_TABLET | Freq: Three times a day (TID) | ORAL | Status: DC
  Administered 2016-08-23 – 2016-08-25 (×7): 10 mg via ORAL
  Filled 2016-08-23 (×6): qty 1

## 2016-08-23 NOTE — Progress Notes (Signed)
Occupational Therapy Session Note  Patient Details  Name: Kimberly Lane MRN: [id number] Date of Birth: [birth year]  Today's Date: 08/23/2016 OT Individual Time: 5427-0623 OT Individual Time Calculation (min): 73 min    Short Term Goals: Week 1:  OT Short Term Goal 1 (Week 1): Pt will transfer to DAC/3 in 1 with MOD A OT Short Term Goal 2 (Week 1): Pt will complete 2/3 steps of toileting on BSC with touching A OT Short Term Goal 3 (Week 1): Pt will don pants with touching A using lateral leans  OT Short Term Goal 4 (Week 1): Pt will complete 5 min of functional task without rest break to improve endurance required for BADLs  Skilled Therapeutic Interventions/Progress Updates:    Pt worked on anterior/posterior transfers to the drop arm commode from bed with min assist to start session.  She then practiced with bed to drop arm commode with min assist using the sliding board as well.  Increased posterior LOB noted during transfer across the board secondary to moving too far back on the board during the transfer.  Completed transfers bed to wheelchair with the sliding board as well.  Max assist for placement of the board with all transfers with min assist for her to remove the board once in place.  Had her work on propelling her wheelchair down to the therapy gym.  She was able to push 1/3 of the way before therapist assisted secondary to time.  Pt also reports bilateral arm pain as well with right shoulder weakness noted at 3+/5 with MMT.  Pt completed two 3 minute intervals on the UE ergonemeter with resistance set on level 5.  First set was peddling forward with RPMs maintained at 13-15.  Second set was with peddling posterior with RPMs at 12-14.  Completed session with return to the room and transfer back to bed with min assist using the sliding board.  Pt left with bed alarm on, call button and phone in reach.  Interpreter present throughout session as well.    Therapy Documentation Precautions:   Precautions Precautions: Fall Restrictions Weight Bearing Restrictions: Yes RLE Weight Bearing: Weight bearing as tolerated (with prosthesis) LLE Weight Bearing: Non weight bearing  Pain: Pain Assessment Pain Assessment: Faces Faces Pain Scale: Hurts a little bit Pain Type: Surgical pain Pain Location: Leg Pain Orientation: Left Pain Descriptors / Indicators: Discomfort Pain Intervention(s): Repositioned;Emotional support ADL: See Function Navigator for Current Functional Status.   Therapy/Group: Individual Therapy  Ally Knodel OTR/L 08/23/2016, 3:42 PM

## 2016-08-23 NOTE — Progress Notes (Signed)
[place] PHYSICAL [facility]ICINE & REHABILITATION     PROGRESS NOTE  Subjective/Complaints:  Pt seen laying in bed this AM.  She appears to have slept well overnight. No family at bedside.   ROS: Limited by language  Objective: Vital Signs: Blood pressure (!) 174/72, pulse 79, temperature 98.8 F (37.1 C), temperature source Oral, resp. rate 20, weight 55.3 kg (121 lb 14.6 oz), SpO2 100 %. No results found.  Recent Labs  08/22/16 0437 08/23/16 0419  WBC 7.4 7.0  HGB 9.9* 10.1*  HCT 32.2* 32.8*  PLT 497* 490*    Recent Labs  08/22/16 0437  NA 137  K 3.8  CL 101  GLUCOSE 237*  BUN 13  CREATININE 0.69  CALCIUM 8.2*   CBG (last 3)   Recent Labs  08/22/16 1636 08/22/16 2246 08/23/16 0636  GLUCAP 86 312* 227*    Wt Readings from Last 3 Encounters:  08/22/16 55.3 kg (121 lb 14.6 oz)  08/19/16 64.8 kg (142 lb 13.7 oz)  04/20/16 63.5 kg (140 lb)    Physical Exam:  BP (!) 174/72 (BP Location: Left Arm)   Pulse 79   Temp 98.8 F (37.1 C) (Oral)   Resp 20   Wt 55.3 kg (121 lb 14.6 oz)   SpO2 100%   BMI 22.30 kg/m  Constitutional: She appears well-developed and well-nourished.  HENT: Normocephalic and atraumatic.  Eyes: EOMI. No discharge.   Cardiovascular: RRR.  No JVD. Respiratory: Effort normal and breath sounds normal.  GI: Soft. Bowel sounds are normal.   Musculoskeletal: She exhibits edema and tenderness.  Neurological: She is alert.  She is able to follow simple motor commands.    Motor: B/l UE 4+/5 proximal to distal RLE: HF, KE 4+/5 (stable) LLE: HF 4/5 (pain inhibition, unchanged)  Skin: L-AKA with staples in place, C/D/I. Old R-BKA well healed.   Psychiatric: She is slowed. Cognition and memory are impaired   Assessment/Plan: 1. Functional deficits secondary to left AKA with history of right BKA which require 3+ hours per day of interdisciplinary therapy in a comprehensive inpatient rehab setting. Physiatrist is providing close team supervision  and 24 hour management of active medical problems listed below. Physiatrist and rehab team continue to assess barriers to discharge/monitor patient progress toward functional and medical goals.  Function:  Bathing Bathing position   Position: Bed  Bathing parts Body parts bathed by patient: Right arm, Left arm, Left upper leg, Right upper leg, Buttocks, Front perineal area, Abdomen, Chest Body parts bathed by helper: Back  Bathing assist Assist Level: Touching or steadying assistance(Pt > 75%)      Upper Body Dressing/Undressing Upper body dressing   What is the patient wearing?: Pull over shirt/dress     Pull over shirt/dress - Perfomed by patient: Thread/unthread right sleeve, Thread/unthread left sleeve, Put head through opening, Pull shirt over trunk          Upper body assist Assist Level: Supervision or verbal cues (per OT note)      Lower Body Dressing/Undressing Lower body dressing   What is the patient wearing?: Pants     Pants- Performed by patient: Thread/unthread right pants leg, Thread/unthread left pants leg Pants- Performed by helper: Pull pants up/down                      Lower body assist Assist for lower body dressing: Touching or steadying assistance (Pt > 75%)      Toileting Toileting Toileting activity did  not occur: No continent bowel/bladder event Toileting steps completed by patient: Performs perineal hygiene Toileting steps completed by helper: Adjust clothing prior to toileting, Performs perineal hygiene, Adjust clothing after toileting Toileting Assistive Devices: Toilet aid  Toileting assist Assist level: Two helpers   Transfers Chair/bed transfer   Chair/bed transfer method: Anterior/posterior Chair/bed transfer assist level: Maximal assist (Pt 25 - 49%/lift and lower) (due to fatigue) Chair/bed transfer assistive device: Armrests     Locomotion Ambulation Ambulation activity did not occur: Safety/medical concerns (BLE  amputee)         Wheelchair   Type: Manual Max wheelchair distance: 50 Assist Level: Supervision or verbal cues  Cognition Comprehension Comprehension assist level: Follows complex conversation/direction with no assist  Expression Expression assist level: Expresses basic 25 - 49% of the time/requires cueing 50 - 75% of the time. Uses single words/gestures.  Social Interaction Social Interaction assist level: Interacts appropriately with others with medication or extra time (anti-anxiety, antidepressant).  Problem Solving Problem solving assist level: Solves basic 75 - 89% of the time/requires cueing 10 - 24% of the time  Memory      Medical Problem List and Plan: 1.  Limitations in mobility, transfers, cognition, and self-care secondary to left AKA with history of right BKA.   Continue CIR 2.  DVT Prophylaxis/Anticoagulation: Pharmaceutical: Lovenox 3. Pain Management: D/Ced scheduled hydrocodone.   Added tylenol qid with ultram or oxycodone prn  4. Mood: LCSW to follow for evaluation and support.  5. Neuropsych: This patient is ?capable of making decisions on her own behalf. 6. Skin/Wound Care: monitor wound daily for healing. Change Glucerna (may be contributing to BS elevation)  to prosource for low protein stores.  7. Fluids/Electrolytes/Nutrition: Monitor I/Os 8. T2DM uncontrolled: Hgb A1C- 11.4.  Received decadron by mistake on 6/28 with elevated BS. Monitor BS for with ac/hs CBG checks  Will use SSI for elevated BS and titrate meds as indicated. Now on Lantus--will need extensive education.   Remains extremely labile with readings >300 and relative hypoglycemia  Will monitor for trend 9. HTN: Monitor BP bid  and titrate meds as indicated.  HCTZ DC'd and Chlorthalidone 50 mg started on 7/1, increased to 100 on 7/2   Cont norvasc.   Hydralizine 10 started 7/3  Monitor with increased mobility 10. Acute fluid overload:  Lasix discontinued. Continue hctz with daily weight for  monitoring.  11. Nausea/?GERD/poor po intake: On protonix. Will discontinue scheduled hydrocodone as may be contributing to symptoms.  Augment bowel program 12. Leucocytosis: Resolved  Likely reactive and resolving. Monitor for trend and signs of infection.   Cont to monitor  13. Chest pain/heaviness: Has been having for months and takes 'some medications".   14. Anxiety disorder: Multiple somatic complaints--monitor for now. Team to provide ego support.  15. Hypokalemia  K+ 3.8 on 7/2  Supplementing daily, decreased dose on 7/2 16. ABLA  Hb 10.1 on 7/3  Cont to monitor  LOS (Days) 4 A FACE TO FACE EVALUATION WAS PERFORMED  Kimberly Lane Lorie Phenix 08/23/2016 8:30 AM

## 2016-08-23 NOTE — Progress Notes (Addendum)
Nutrition Brief Note  RD consulted via RN regarding food preferences. RD discussed with patient on food preferences and food choices available on the menu. Encouraged family to bring in food that pt prefers to consume that are not available to her while inpatient. Pt reports having a good appetite no other difficulties or complaints.   Corrin Parker, MS, RD, LDN Pager # [phone] After hours/ weekend pager # [phone]

## 2016-08-23 NOTE — Progress Notes (Signed)
Occupational Therapy Session Note  Patient Details  Name: Kimberly Lane MRN: [id number] Date of Birth: [birth year]  Today's Date: 08/23/2016 OT Individual Time: 6147-0929 OT Individual Time Calculation (min): 59 min    Short Term Goals: Week 1:  OT Short Term Goal 1 (Week 1): Pt will transfer to DAC/3 in 1 with MOD A OT Short Term Goal 2 (Week 1): Pt will complete 2/3 steps of toileting on BSC with touching A OT Short Term Goal 3 (Week 1): Pt will don pants with touching A using lateral leans  OT Short Term Goal 4 (Week 1): Pt will complete 5 min of functional task without rest break to improve endurance required for BADLs  Skilled Therapeutic Interventions/Progress Updates:    Pt in bed at start of session.  Interpreter present as well.  Agreed to work on bathing and dressing sitting EOB.  Pt able to transition from supine to sit EOB with min assist.  Once sitting, she was able to complete all UB bathing and dressing with supervision.  Min assist for LB bathing and dressing with lateral leans for washing peri area and pulling items over her hips.  Max assist for therapist to donn new brief.  Min assist for pt to return to sitting from sidelying when applying LB clothing secondary to weakness.  Pt completed posterior transfer to the wheelchair with min assist using reciprical scooting method.  Pt finished session with oral hygiene from wheelchair level.  Pt left in wheelchair with interpreter present, safety belt in place and call button in reach.  Pt still with reports of dizziness at times but BP 179/78 in sitting.    Therapy Documentation Precautions:  Precautions Precautions: Fall Restrictions Weight Bearing Restrictions: Yes RLE Weight Bearing: Weight bearing as tolerated (with prosthesis) LLE Weight Bearing: Non weight bearing   Vital Signs: Therapy Vitals Temp: 98.8 F (37.1 C) Temp Source: Oral Pulse Rate: 79 Resp: 20 BP: (!) 174/72 Patient Position (if appropriate):  Lying Oxygen Therapy SpO2: 100 % O2 Device: Not Delivered Pain: Pain Assessment Pain Assessment: Faces Faces Pain Scale: Hurts little more Pain Type: Surgical pain Pain Location: Leg Pain Orientation: Left Pain Onset: With Activity Pain Intervention(s): Repositioned ADL: See Function Navigator for Current Functional Status.   Therapy/Group: Individual Therapy  Cherlynn Popiel OTR/L 08/23/2016, 10:45 AM

## 2016-08-23 NOTE — Progress Notes (Signed)
Physical Therapy Session Note  Patient Details  Name: Kimberly Lane MRN: [id number] Date of Birth: 1960-04-09  Today's Date: 08/23/2016 PT Individual Time: 1100-1200 PT Individual Time Calculation (min): 60 min   Short Term Goals: Week 1:  PT Short Term Goal 1 (Week 1): Pt will perform SB transfer with mod assist consistently PT Short Term Goal 2 (Week 1): Pt will perform bed mobility with min assist PT Short Term Goal 3 (Week 1): Pt will propell WC 157f with min assist  PT Short Term Goal 4 (Week 1): Pt will be able to maintian sitting balance with supervision assist x 5 minutes without orthostasis.   Skilled Therapeutic Interventions/Progress Updates: Ptt presented in w/c agreeable to therapy with interpreter present. W/c propulsion 765fwith supervision and x 1 rest break due to fatigue. Transported to rehab gym remaining distance for energy conservation. Instructed and performed SB transfer to R with modA. Pt required multimodal cues for anterior wt shift and head hips relationship. Noted pt to grimacing after completion of transfer. When asked about pain pt expressed via interpreter that she felt pain but described more as emotional vs physical through interpreter. Askeld pt if she had someone who she could talk to and expressed that whomever she spoke with it would be beneficial to talk with someone about how she's feeling. Continued to provided emotional support throughout remainder of session. Pt sat unsupported approx 7 min on mat while speaking with pt. Pt performed SB transfer to return to w/c with improved technique. Transported back to room as pt indicated wish to use BSC. Pt request to use SB to BSCommunity [facility]ptlPerformed to R with minA. After toileting returned to w/c transfer to L with modA. Pt propelled self to sink and performed hand hygiene. Pt remained in w/c at end of session with current needs met.      Therapy Documentation Precautions:  Precautions Precautions: Fall Restrictions Weight  Bearing Restrictions: Yes RLE Weight Bearing: Weight bearing as tolerated (with prosthesis) LLE Weight Bearing: Non weight bearing General:   Vital Signs:  Pain: Pain Assessment Pain Assessment: Faces Faces Pain Scale: Hurts little more Pain Type: Surgical pain Pain Location: Leg Pain Orientation: Left Pain Onset: With Activity Pain Intervention(s): Repositioned     See Function Navigator for Current Functional Status.   Therapy/Group: Individual Therapy  Bryker Fletchall  Jenyfer Trawick, PTA  08/23/2016, 12:08 PM

## 2016-08-24 ENCOUNTER — Inpatient Hospital Stay (HOSPITAL_COMMUNITY): Payer: No Typology Code available for payment source | Admitting: Occupational Therapy

## 2016-08-24 ENCOUNTER — Inpatient Hospital Stay (HOSPITAL_COMMUNITY): Payer: Medicaid Other | Admitting: Physical Therapy

## 2016-08-24 ENCOUNTER — Inpatient Hospital Stay (HOSPITAL_COMMUNITY): Payer: No Typology Code available for payment source | Admitting: *Deleted

## 2016-08-24 DIAGNOSIS — R0989 Other specified symptoms and signs involving the circulatory and respiratory systems: Secondary | ICD-10-CM

## 2016-08-24 LAB — GLUCOSE, CAPILLARY
GLUCOSE-CAPILLARY: 256 mg/dL — AB (ref 65–99)
GLUCOSE-CAPILLARY: 251 mg/dL — AB (ref 65–99)
GLUCOSE-CAPILLARY: 150 mg/dL — AB (ref 65–99)
GLUCOSE-CAPILLARY: 198 mg/dL — AB (ref 65–99)

## 2016-08-24 NOTE — Progress Notes (Signed)
Request diabetes educator for diet, insulin adiministration, and general education

## 2016-08-24 NOTE — Progress Notes (Signed)
Occupational Therapy Session Note  Patient Details  Name: Kimberly Lane MRN: [id number] Date of Birth: November 22, 1960  Today's Date: 08/24/2016 OT Individual Time: 0858-1000 OT Individual Time Calculation (min): 62 min    Short Term Goals: Week 1:  OT Short Term Goal 1 (Week 1): Pt will transfer to DAC/3 in 1 with MOD A OT Short Term Goal 2 (Week 1): Pt will complete 2/3 steps of toileting on BSC with touching A OT Short Term Goal 3 (Week 1): Pt will don pants with touching A using lateral leans  OT Short Term Goal 4 (Week 1): Pt will complete 5 min of functional task without rest break to improve endurance required for BADLs  Skilled Therapeutic Interventions/Progress Updates:    Bathing and dressing bed level during session with interpreter present.  She needed min assist for transfer from supine to sit EOB.  Once sitting she completed all bathing with supervision, including washing peri area with mod demonstrational cueing for technique of lateral leans.  Mod assist needed for donning new brief over hips as pt does not complete lateral leans far enough to pull the brief up in the back.  Min assist for transfer to the wheelchair using the sliding board.  Educated pt on steps to complete transfer including locking brakes, removing arm rest, and then position the board.  Will review in PM session to see if she can recall them.  Pt left in wheelchair at end of session with interpreter present and call button in reach.  Safety belt also in place.    Therapy Documentation Precautions:  Precautions Precautions: Fall Restrictions Weight Bearing Restrictions: Yes RLE Weight Bearing: Weight bearing as tolerated LLE Weight Bearing: Non weight bearing General:   Vital Signs:  Pain: Pain Assessment Pain Assessment: Faces Faces Pain Scale: Hurts little more Pain Type: Surgical pain Pain Orientation: Left Pain Descriptors / Indicators: Discomfort Pain Intervention(s): Repositioned ADL:   Vision   Perception    Praxis   Exercises:   Other Treatments:    See Function Navigator for Current Functional Status.   Therapy/Group: Individual Therapy  Alonia Dibuono 08/24/2016, 10:31 AM

## 2016-08-24 NOTE — Progress Notes (Signed)
Physical Therapy Session Note  Patient Details  Name: Kimberly Lane MRN: [id number] Date of Birth: 26-Apr-1960  Today's Date: 08/24/2016 PT Individual Time: 1100-1200 PT Individual Time Calculation (min): 60 min   Short Term Goals: Week 1:  PT Short Term Goal 1 (Week 1): Pt will perform SB transfer with mod assist consistently PT Short Term Goal 2 (Week 1): Pt will perform bed mobility with min assist PT Short Term Goal 3 (Week 1): Pt will propell WC 180ft with min assist  PT Short Term Goal 4 (Week 1): Pt will be able to maintian sitting balance with supervision assist x 5 minutes without orthostasis.   Skilled Therapeutic Interventions/Progress Updates: Pt presented in w/c agreeable to therapy. Pt propelled approx 169ft to nsg station with supervision and x 2 brief rests due to fatigue. Transported pt remaining distance to rehab gym for energy conservation. Pt performed SB transfer to R with minA and cues for anterior wt shift and tactile cues for head/hips relationship. Pt participated in game of catch with beach ball for dynamic sitting balance. Pt demonstrated no LOB throughout activity. Pt engaged in UE therex including bicep curls, chest press with 1lb dowel x 8-10 bilaterally and triceps push ups x 5 bilaterally. Pt maintained unsupported sitting at mat approx 83min before fatigue and returned to w/c via SB transfer in same manner as prior. Pt then participated in UBE L1 x 4 min to fatigue. Pt returned to room and remained in w/c with QRB on and call bell within reach.      Therapy Documentation Precautions:  Precautions Precautions: Fall Restrictions Weight Bearing Restrictions: Yes RLE Weight Bearing: Weight bearing as tolerated LLE Weight Bearing: Non weight bearing General:   Vital Signs:  Pain: Pain Assessment Pain Assessment: Faces Faces Pain Scale: Hurts little more Pain Type: Surgical pain Pain Orientation: Left Pain Descriptors / Indicators: Discomfort Pain  Intervention(s): Repositioned  See Function Navigator for Current Functional Status.   Therapy/Group: Individual Therapy  Yenty Bloch  Saralee Bolick, PTA  08/24/2016, 12:18 PM

## 2016-08-24 NOTE — Progress Notes (Signed)
Social Work Patient ID: Kimberly Lane, female   DOB: 1960/12/09, 56 y.o.   MRN: [id number]  Met with pt and interpreter to inform team conference goals supervision level and target discharge date 7/12. Will contact [facility] coordinator-Andrea to give update tomorrow when there. Will work on discharge needs, still asking about a power chair. Will ask PT to work on this, if appropriate.

## 2016-08-24 NOTE — Patient Care Conference (Signed)
Inpatient RehabilitationTeam Conference and Plan of Care Update Date: 08/24/2016   Time: 10:10 AM    Patient Name: Kimberly Lane      Medical Record Number: [id number]  Date of Birth: [birth year] Sex: Female         Room/Bed: 4M10C/4M10C-01 Payor Info: Payor: [facility] / Plan: [facility] / Product Type: *No Product type* /    Admitting Diagnosis: Bil Amputee  Admit Date/Time:  08/19/2016  4:50 PM Admission Comments: No comment available   Primary Diagnosis:  <principal problem not specified> Principal Problem: <principal problem not specified>  Patient Active Problem List   Diagnosis Date Noted  . Labile blood pressure   . Labile blood glucose   . Acute blood loss anemia   . Hypokalemia   . Benign essential HTN   . Uncontrolled diabetes mellitus type 2 with peripheral artery disease ([place])   . Acute diastolic congestive heart failure ([place]) 08/19/2016  . Unilateral AKA, left ([place]) 08/19/2016  . Post-operative pain   . Leukocytosis   . Anxiety state   . S/P AKA (above knee amputation), left ([place]) 08/17/2016  . Acute respiratory failure ([place])   . Dyspnea   . Gangrene due to peripheral vascular disease ([place]) 08/09/2016  . PAD (peripheral artery disease) ([place]) 03/11/2016  . Dysuria 10/24/2014  . Osteomyelitis of left foot ([place])   . Cough   . Hx of right BKA ([place]) 05/26/2014  . S/P BKA (below knee amputation) unilateral, right ([place])   . Abnormal EKG   . Preop cardiovascular exam 05/21/2014  . Cellulitis of toe of right foot   . Peripheral arterial disease ([place])   . Cellulitis 05/13/2014  . Type 2 diabetes mellitus with complication ([place])   . HLD (hyperlipidemia)   . Pure hypercholesterolemia 11/14/2013  . High risk social situation 11/13/2013  . Back pain without radiation 09/05/2011    Class: Acute  . Osteoarthritis of left hip 05/31/2011  . History of primary TB 04/26/2011  . DM (diabetes mellitus), type 2, uncontrolled ([place]) 05/17/2010  . Essential  hypertension 12/09/2009    Expected Discharge Date: Expected Discharge Date: 09/01/16  Team Members Present: Physician leading conference: Dr. Delice Lesch Social Worker Present: Ovidio Kin, LCSW Nurse Present: Other (comment) Alda Lea) PT Present: Kem Parkinson, PT OT Present: Clyda Greener, OT SLP Present: Windell Moulding, SLP PPS Coordinator present : Daiva Nakayama, RN, CRRN     Current Status/Progress Goal Weekly Team Focus  Medical   Limitations in mobility, transfers, cognition, and self-care secondary to left AKA with history of right BKA  Improve mobility, safety, DM, HTN  See above   Bowel/Bladder   Mostly incontinent of bowel and bladder.  Keep patient clean and dry as much as possible.  Monitor for episodes of incontinence and clean and dry as needed.   Swallow/Nutrition/ Hydration             ADL's   supervision for UB bathing and dressing.  Min assist for LB bathing and dressing with lateral leans.  Min assist for sliding board transfers to drop arm commode and for anterior/posterior transfer  supervision overall  selfcare retraining, balance retraining, transfer training, therapeutilc exercise, pt/family education   Mobility   MinA A-P transfer bed to w/c, min/modA SB transfer, w/c propulsion up to 54ft limited by fatigue  min-supervision assist   endurance, SB transfers   Communication             Safety/Cognition/ Behavioral Observations  Pain   Pain levels between 0 and 9.  <3.  Monitor for pain and treat as needed.   Skin   Intact, but for surgical wound whic is healing, well approximated, and without s/s of infection.  Maintain skin integrity and healing of surgical wound without complication.  Skin integrity and wound healing.      *See Care Plan and progress notes for long and short-term goals.  Barriers to Discharge: Mobility, safety, DM, HTN, transfers    Possible Resolutions to Barriers:  Therapies, optimize DM/HTN meds,  educate ond DM    Discharge Planning/Teaching Needs:  HOme with husband and with [facility]. hsband would like pt to be evaluated for a power chair while here.      Team Discussion:  Goals supervision level-overall. MD adjusting diabetes and HTN meds, still not stable. RN to do teaching on diabetes since candy in room. Working on [employer] transfers and anterior and posterior transfers. Fatigues easily.  Revisions to Treatment Plan:  DC 7/12   Continued Need for Acute Rehabilitation Level of Care: The patient requires daily medical management by a physician with specialized training in physical medicine and rehabilitation for the following conditions: Daily direction of a multidisciplinary physical rehabilitation program to ensure safe treatment while eliciting the highest outcome that is of practical value to the patient.: Yes Daily medical management of patient stability for increased activity during participation in an intensive rehabilitation regime.: Yes Daily analysis of laboratory values and/or radiology reports with any subsequent need for medication adjustment of medical intervention for : Post surgical problems;Diabetes problems;Blood pressure problems;Wound care problems  Elease Hashimoto 08/24/2016, 2:35 PM

## 2016-08-24 NOTE — Progress Notes (Signed)
Occupational Therapy Session Note  Patient Details  Name: Kimberly Lane MRN: [id number] Date of Birth: [birth year]  Today's Date: 08/24/2016 OT Individual Time: 4128-7867 OT Individual Time Calculation (min): 74 min    Short Term Goals: Week 1:  OT Short Term Goal 1 (Week 1): Pt will transfer to DAC/3 in 1 with MOD A OT Short Term Goal 2 (Week 1): Pt will complete 2/3 steps of toileting on BSC with touching A OT Short Term Goal 3 (Week 1): Pt will don pants with touching A using lateral leans  OT Short Term Goal 4 (Week 1): Pt will complete 5 min of functional task without rest break to improve endurance required for BADLs  Skilled Therapeutic Interventions/Progress Updates:    Pt completed wheelchair mobility to the gym with supervision.  She needed 3 rest breaks to complete.  Once in the therapy gym pt transferred to the therapy mat via sliding board with min assist . Once on the mat had pt focus on use of push up blocks for 3 sets of 10 repetitions with min assist.  Two minute rest break needed to complete each set.  Had pt work on Airline pilot as well with use of theraband, to pull pants over hips.  Pt needing min guard assist to complete this with lateral leans side to side.  Transitioned to scooting up and down the mat with min assist in order to get enough lift to scoot both hips together.  Transferred back to the wheelchair with min assist with use of sliding board again and then returned back to the room with therapist assist.  Pt left sitting EOB with bed alarm in place and call button in reach.  Therapy Documentation Precautions:  Precautions Precautions: Fall Restrictions Weight Bearing Restrictions: Yes RLE Weight Bearing: Weight bearing as tolerated LLE Weight Bearing: Non weight bearing  Pain: Pain Assessment Pain Assessment: Faces Faces Pain Scale: Hurts a little bit Pain Type: Surgical pain Pain Location: Leg Pain Orientation: Left Pain Descriptors /  Indicators: Discomfort Pain Intervention(s): Repositioned Multiple Pain Sites: No ADL: See Function Navigator for Current Functional Status.   Therapy/Group: Individual Therapy  Novak Stgermaine OTR/L 08/24/2016, 3:45 PM

## 2016-08-24 NOTE — Progress Notes (Signed)
[facility] PHYSICAL MEDICINE & REHABILITATION     PROGRESS NOTE  Subjective/Complaints:  Pt seen laying in  Bed this AM.  She is sleeping with her head at the foot of the bed.  Husband appears to try to suggesting something about amputation to me.  No interpretor present.   ROS: Limited by language  Objective: Vital Signs: Blood pressure (!) 153/65, pulse 83, temperature 99.2 F (37.3 C), temperature source Oral, resp. rate 20, weight 55.3 kg (121 lb 14.6 oz), SpO2 95 %. No results found.  Recent Labs  08/22/16 0437 08/23/16 0419  WBC 7.4 7.0  HGB 9.9* 10.1*  HCT 32.2* 32.8*  PLT 497* 490*    Recent Labs  08/22/16 0437  NA 137  K 3.8  CL 101  GLUCOSE 237*  BUN 13  CREATININE 0.69  CALCIUM 8.2*   CBG (last 3)   Recent Labs  08/23/16 1649 08/23/16 2111 08/24/16 0637  GLUCAP 144* 240* 256*    Wt Readings from Last 3 Encounters:  08/22/16 55.3 kg (121 lb 14.6 oz)  08/19/16 64.8 kg (142 lb 13.7 oz)  04/20/16 63.5 kg (140 lb)    Physical Exam:  BP (!) 153/65 (BP Location: Right Arm)   Pulse 83   Temp 99.2 F (37.3 C) (Oral)   Resp 20   Wt 55.3 kg (121 lb 14.6 oz)   SpO2 95%   BMI 22.30 kg/m  Constitutional: She appears well-developed and well-nourished.  HENT: Normocephalic and atraumatic.  Eyes: EOMI. No discharge.   Cardiovascular: RRR.  No JVD. Respiratory: Effort normal and breath sounds normal.  GI: Soft. Bowel sounds are normal.   Musculoskeletal: She exhibits edema and tenderness.  Neurological: She is alert.  She is able to follow simple motor commands.    Motor: B/l UE 4+/5 proximal to distal RLE: HF, KE 4+/5 (stable) LLE: HF 4/5 (pain inhibition, stable)  Skin: L-AKA with staples in place, C/D/I. Old R-BKA well healed.   Psychiatric: She is slowed. Cognition and memory are impaired   Assessment/Plan: 1. Functional deficits secondary to left AKA with history of right BKA which require 3+ hours per day of interdisciplinary therapy in a  comprehensive inpatient rehab setting. Physiatrist is providing close team supervision and 24 hour management of active medical problems listed below. Physiatrist and rehab team continue to assess barriers to discharge/monitor patient progress toward functional and medical goals.  Function:  Bathing Bathing position   Position: Bed  Bathing parts Body parts bathed by patient: Right arm, Left arm, Left upper leg, Right upper leg, Buttocks, Front perineal area, Abdomen, Chest Body parts bathed by helper: Back  Bathing assist Assist Level: Touching or steadying assistance(Pt > 75%)      Upper Body Dressing/Undressing Upper body dressing   What is the patient wearing?: Pull over shirt/dress     Pull over shirt/dress - Perfomed by patient: Thread/unthread right sleeve, Thread/unthread left sleeve, Put head through opening, Pull shirt over trunk          Upper body assist Assist Level: Supervision or verbal cues      Lower Body Dressing/Undressing Lower body dressing   What is the patient wearing?: Pants     Pants- Performed by patient: Thread/unthread right pants leg, Thread/unthread left pants leg Pants- Performed by helper: Pull pants up/down                      Lower body assist Assist for lower body dressing: Touching or  steadying assistance (Pt > 75%)      Toileting Toileting Toileting activity did not occur: No continent bowel/bladder event Toileting steps completed by patient: Performs perineal hygiene Toileting steps completed by helper: Adjust clothing prior to toileting, Performs perineal hygiene, Adjust clothing after toileting Toileting Assistive Devices: Toilet aid  Toileting assist Assist level: Two helpers   Transfers Chair/bed transfer   Chair/bed transfer method: Anterior/posterior Chair/bed transfer assist level: Touching or steadying assistance (Pt > 75%) Chair/bed transfer assistive device: Armrests     Locomotion Ambulation Ambulation  activity did not occur: Safety/medical concerns (BLE amputee)         Wheelchair   Type: Manual Max wheelchair distance: 50 Assist Level: Supervision or verbal cues  Cognition Comprehension Comprehension assist level: Follows complex conversation/direction with no assist  Expression Expression assist level: Expresses basic 90% of the time/requires cueing < 10% of the time.  Social Interaction Social Interaction assist level: Interacts appropriately with others - No medications needed.  Problem Solving Problem solving assist level: Solves basic 75 - 89% of the time/requires cueing 10 - 24% of the time  Memory Memory assist level: Complete Independence: No helper    Medical Problem List and Plan: 1.  Limitations in mobility, transfers, cognition, and self-care secondary to left AKA with history of right BKA.   Continue CIR 2.  DVT Prophylaxis/Anticoagulation: Pharmaceutical: Lovenox 3. Pain Management: D/Ced scheduled hydrocodone.   Added tylenol qid with ultram or oxycodone prn  4. Mood: LCSW to follow for evaluation and support.  5. Neuropsych: This patient is ?capable of making decisions on her own behalf. 6. Skin/Wound Care: monitor wound daily for healing. Change Glucerna (may be contributing to BS elevation)  to prosource for low protein stores.  7. Fluids/Electrolytes/Nutrition: Monitor I/Os 8. T2DM uncontrolled: Hgb A1C- 11.4.  Received decadron by mistake on 6/28 with elevated BS. Monitor BS for with ac/hs CBG checks  Will use SSI for elevated BS and titrate meds as indicated. Now on Lantus--will need extensive education.   Remains extremely labile, variable intake contributing   Will monitor for trend 9. HTN: Monitor BP bid  and titrate meds as indicated.  HCTZ DC'd and Chlorthalidone 50 mg started on 7/1, increased to 100 on 7/2   Cont norvasc.   Hydralizine 10 started 7/3  Monitor with increased mobility  Improving, cont to monitor 10. Acute fluid overload:  Lasix  discontinued. Continue hctz with daily weight for monitoring.  11. Nausea/?GERD/poor po intake: On protonix. Will discontinue scheduled hydrocodone as may be contributing to symptoms.  Augment bowel program 12. Leucocytosis: Resolved  Likely reactive and resolving. Monitor for trend and signs of infection.   Cont to monitor  13. Chest pain/heaviness: Has been having for months and takes 'some medications".   14. Anxiety disorder: Multiple somatic complaints--monitor for now. Team to provide ego support.  15. Hypokalemia  K+ 3.8 on 7/2  Supplementing daily, decreased dose on 7/2 16. ABLA  Hb 10.1 on 7/3  Cont to monitor  LOS (Days) 5 A FACE TO FACE EVALUATION WAS PERFORMED  Kimberly Lane Lorie Phenix 08/24/2016 8:31 AM

## 2016-08-25 ENCOUNTER — Ambulatory Visit (HOSPITAL_COMMUNITY): Payer: Medicaid Other | Admitting: *Deleted

## 2016-08-25 ENCOUNTER — Inpatient Hospital Stay (HOSPITAL_COMMUNITY): Payer: Medicaid Other | Admitting: Physical Therapy

## 2016-08-25 ENCOUNTER — Inpatient Hospital Stay (HOSPITAL_COMMUNITY): Payer: No Typology Code available for payment source | Admitting: Occupational Therapy

## 2016-08-25 LAB — GLUCOSE, CAPILLARY
GLUCOSE-CAPILLARY: 287 mg/dL — AB (ref 65–99)
Glucose-Capillary: 229 mg/dL — ABNORMAL HIGH (ref 65–99)
Glucose-Capillary: 172 mg/dL — ABNORMAL HIGH (ref 65–99)
Glucose-Capillary: 119 mg/dL — ABNORMAL HIGH (ref 65–99)

## 2016-08-25 MED ORDER — HYDRALAZINE HCL 25 MG PO TABS
25.0000 mg | ORAL_TABLET | Freq: Three times a day (TID) | ORAL | Status: DC
  Administered 2016-08-25 – 2016-09-01 (×21): 25 mg via ORAL
  Filled 2016-08-25 (×21): qty 1

## 2016-08-25 NOTE — Progress Notes (Signed)
Social Work Patient ID: Kimberly Lane, female   DOB: 13-Oct-1960, 56 y.o.   MRN: [id number]  Met with pt with interpreter to inform of team conference supervision level goals and target discharge date 7/12. Contacted [facility] regarding discharge and husband's request of power chair. She reports her therapy team will evaluate her for this and go forth with obtaining one. They want [place] to hold off  On having an evaluation here. Will [name]-PT know this information. Delia Chimes has scheduled a diabetic coordinator to come in and education her on diabetes tomorrow at 1:00 pm will have the interpreter here.

## 2016-08-25 NOTE — Progress Notes (Signed)
Occupational Therapy Session Note  Patient Details  Name: Kimberly Lane MRN: [id number] Date of Birth: [birth year]  Today's Date: 08/25/2016 OT Individual Time: 1300-1415 OT Individual Time Calculation (min): 75 min    Short Term Goals: Week 1:  OT Short Term Goal 1 (Week 1): Pt will transfer to DAC/3 in 1 with MOD A OT Short Term Goal 2 (Week 1): Pt will complete 2/3 steps of toileting on BSC with touching A OT Short Term Goal 3 (Week 1): Pt will don pants with touching A using lateral leans  OT Short Term Goal 4 (Week 1): Pt will complete 5 min of functional task without rest break to improve endurance required for BADLs  Skilled Therapeutic Interventions/Progress Updates:    Pt completed transfer from the power wheelchair to the bed with min assist using the sliding board.  She was able to also donn her prosthesis on the RLE with setup as well.  Once on the bed, had her transfer to the wheelchair, as this is likely what she will continue using at home when discharged instead of using the power wheelchair.  Pt continues to demonstrate some difficulty with clearing her bottom during transfers. Therapists provided gloves to help decrease pain in hands with pushing the wheelchair.   Had her propel her wheelchair down to the therapy gym with increased time and supervision.  Completed transfer to the therapy mat with min assist using the sliding board.  Focused session on scooting up and down the mat.  She was able to scoot to the right with supervision but needed min assist to the left.  Needed max demonstrational cueing for technique to achieve forward weight shift to help increase use of the RLE during transfers.  Also had pt use push up blocks for 2 sets of 10 reps with min assist as well as working on scooting forward and backward with min assist.  Returned to wheelchair at end of session and back to room.  Min assist for transfer back to the bed to conclude session.  Bed alarm in place and call button  in reach.    Therapy Documentation Precautions:  Precautions Precautions: Fall Restrictions Weight Bearing Restrictions: Yes RLE Weight Bearing: Weight bearing as tolerated LLE Weight Bearing: Non weight bearing  Vital Signs: Therapy Vitals Temp: 97.9 F (36.6 C) Temp Source: Oral Pulse Rate: 78 Resp: 16 BP: (!) 151/71 Patient Position (if appropriate): Lying Oxygen Therapy SpO2: 100 % O2 Device: Not Delivered Pain: Pain Assessment Pain Assessment: Faces Faces Pain Scale: Hurts little more Pain Type: Surgical pain Pain Location: Hip Pain Orientation: Left Pain Descriptors / Indicators: Discomfort Pain Onset: With Activity Pain Intervention(s): Repositioned ADL: See Function Navigator for Current Functional Status.   Therapy/Group: Individual Therapy  Gissela Bloch OTR/L 08/25/2016, 3:49 PM

## 2016-08-25 NOTE — Progress Notes (Signed)
Occupational Therapy Session Note  Patient Details  Name: Kimberly Lane MRN: [id number] Date of Birth: 03-02-1960  Today's Date: 08/25/2016 OT Individual Time: 9030-0923 OT Individual Time Calculation (min): 58 min    Short Term Goals: Week 1:  OT Short Term Goal 1 (Week 1): Pt will transfer to DAC/3 in 1 with MOD A OT Short Term Goal 2 (Week 1): Pt will complete 2/3 steps of toileting on BSC with touching A OT Short Term Goal 3 (Week 1): Pt will don pants with touching A using lateral leans  OT Short Term Goal 4 (Week 1): Pt will complete 5 min of functional task without rest break to improve endurance required for BADLs  Skilled Therapeutic Interventions/Progress Updates:    Pt completed bathing and dressing sit to supine during session with interpreter and pt's spouse present as well.  Pt's spouse inquiring about getting the pt's amputated limb, which was promised to him by the surgeon, based on cultural beliefs.  Directed this to the PA to take care of.  Pt completed supine to sit with min assist and mod demonstrational cueing for technique to roll and then complete sitting from sidelying.  Pt with hospital bed at home but feels this will help increase her strength.  Still needs mod assist for donning brief over hips with sidelying to sit and supine rolling.  Min assist for pulling pants over hips sit to sidelying as well.  Pt with increased pain in the left leg which still limits her at times as well.  Discussed via translator the need for family education hands on next week prior to discharge as well.  Pt's spouse voiced understanding.  Pt left in bed to rest at end of session with spouse present.   Therapy Documentation Precautions:  Precautions Precautions: Fall Restrictions Weight Bearing Restrictions: Yes RLE Weight Bearing: Weight bearing as tolerated LLE Weight Bearing: Non weight bearing  Pain: Pain Assessment Pain Assessment: Faces Pain Score: 5  Faces Pain Scale: Hurts a  little bit Pain Type: Surgical pain Pain Location: Leg Pain Orientation: Left Pain Descriptors / Indicators: Discomfort Pain Frequency: Intermittent Pain Onset: With Activity Pain Intervention(s): Repositioned ADL: See Function Navigator for Current Functional Status.   Therapy/Group: Individual Therapy  Tracee Mccreery OTR/L 08/25/2016, 10:38 AM

## 2016-08-25 NOTE — Progress Notes (Signed)
[facility]ICINE [facility] REHABILITATION     PROGRESS NOTE  Subjective/Complaints:  Pt seen laying in bed this AM.  No husband or interpretor present.   ROS: Limited by language  Objective: Vital Signs: Blood pressure (!) 164/55, pulse 71, temperature 99.2 F (37.3 C), temperature source Oral, resp. rate 18, height 5\' 2"  (1.575 m), weight 56 kg (123 lb 7.3 oz), SpO2 99 %. No results found.  Recent Labs  08/23/16 0419  WBC 7.0  HGB 10.1*  HCT 32.8*  PLT 490*   No results for input(s): NA, K, CL, GLUCOSE, BUN, CREATININE, CALCIUM in the last 72 hours.  Invalid input(s): CO CBG (last 3)   Recent Labs  08/24/16 1719 08/24/16 2130 08/25/16 0647  GLUCAP 150* 198* 229*    Wt Readings from Last 3 Encounters:  08/25/16 56 kg (123 lb 7.3 oz)  08/19/16 64.8 kg (142 lb 13.7 oz)  04/20/16 63.5 kg (140 lb)    Physical Exam:  BP (!) 164/55 (BP Location: Right Arm)   Pulse 71   Temp 99.2 F (37.3 C) (Oral)   Resp 18   Ht 5\' 2"  (1.575 m)   Wt 56 kg (123 lb 7.3 oz)   SpO2 99%   BMI 22.58 kg/m  Constitutional: She appears well-developed and well-nourished.  HENT: Normocephalic and atraumatic.  Eyes: EOMI. No discharge.   Cardiovascular: RRR.  No JVD. Respiratory: Effort normal and breath sounds normal.  GI: Soft. Bowel sounds are normal.   Musculoskeletal: She exhibits edema and tenderness.  Neurological: She is alert.  She is able to follow simple motor commands.    Motor: B/l UE 4+/5 proximal to distal RLE: HF, KE 4+/5 (stable) LLE: HF 4/5 (pain inhibition, stable)  Skin: L-AKA with staples in place, C/D/I.  Old R-BKA well healed.   Psychiatric: She is slowed. Cognition and memory are impaired   Assessment/Plan: 1. Functional deficits secondary to left AKA with history of right BKA which require 3+ hours per day of interdisciplinary therapy in a comprehensive inpatient rehab setting. Physiatrist is providing close team supervision and 24 hour management of  active medical problems listed below. Physiatrist and rehab team continue to assess barriers to discharge/monitor patient progress toward functional and medical goals.  Function:  Bathing Bathing position   Position: Bed  Bathing parts Body parts bathed by patient: Right arm, Left arm, Left upper leg, Right upper leg, Buttocks, Front perineal area, Abdomen, Chest Body parts bathed by helper: Back  Bathing assist Assist Level: Touching or steadying assistance(Pt > 75%)      Upper Body Dressing/Undressing Upper body dressing   What is the patient wearing?: Pull over shirt/dress     Pull over shirt/dress - Perfomed by patient: Thread/unthread right sleeve, Thread/unthread left sleeve, Put head through opening, Pull shirt over trunk          Upper body assist Assist Level: Supervision or verbal cues      Lower Body Dressing/Undressing Lower body dressing   What is the patient wearing?: Pants     Pants- Performed by patient: Thread/unthread right pants leg, Thread/unthread left pants leg Pants- Performed by helper: Pull pants up/down                      Lower body assist Assist for lower body dressing: Touching or steadying assistance (Pt > 75%)      Toileting Toileting Toileting activity did not occur: No continent bowel/bladder event Toileting steps completed by patient:  Performs perineal hygiene Toileting steps completed by helper: Performs perineal hygiene Toileting Assistive Devices: Toilet aid  Toileting assist Assist level: Two helpers   Transfers Chair/bed transfer   Chair/bed transfer method: Lateral scoot Chair/bed transfer assist level: Touching or steadying assistance (Pt > 75%) Chair/bed transfer assistive device: Sliding board     Locomotion Ambulation Ambulation activity did not occur: Safety/medical concerns (BLE amputee)         Wheelchair   Type: Manual Max wheelchair distance: 50 Assist Level: Supervision or verbal cues   Cognition Comprehension Comprehension assist level: Understands basic 90% of the time/cues < 10% of the time  Expression Expression assist level: Expresses basic 90% of the time/requires cueing < 10% of the time.  Social Interaction Social Interaction assist level: Interacts appropriately 90% of the time - Needs monitoring or encouragement for participation or interaction.  Problem Solving Problem solving assist level: Solves basic 75 - 89% of the time/requires cueing 10 - 24% of the time  Memory Memory assist level: Recognizes or recalls 75 - 89% of the time/requires cueing 10 - 24% of the time    Medical Problem List and Plan: 1.  Limitations in mobility, transfers, cognition, and self-care secondary to left AKA with history of right BKA.   Continue CIR 2.  DVT Prophylaxis/Anticoagulation: Pharmaceutical: Lovenox 3. Pain Management: D/Ced scheduled hydrocodone.   Added tylenol qid with ultram or oxycodone prn  4. Mood: LCSW to follow for evaluation and support.  5. Neuropsych: This patient is ?capable of making decisions on her own behalf. 6. Skin/Wound Care: monitor wound daily for healing. Change Glucerna (may be contributing to BS elevation)  to prosource for low protein stores.  7. Fluids/Electrolytes/Nutrition: Monitor I/Os 8. T2DM uncontrolled: Hgb A1C- 11.4.  Received decadron by mistake on 6/28 with elevated BS. Monitor BS for with ac/hs CBG checks  Will use SSI for elevated BS and titrate meds as indicated. Now on Lantus--will need extensive education.   Remains extremely labile, variable intake and poor dietary choices contributing  Dietary consult  Will monitor for trend 9. HTN: Monitor BP bid  and titrate meds as indicated.  HCTZ DC'd and Chlorthalidone 50 mg started on 7/1, increased to 100 on 7/2   Cont norvasc.   Hydralizine 10 started 7/3, increased to 25 on 7/5  Monitor with increased mobility  Improving, cont to monitor 10. Acute fluid overload:  Lasix discontinued.  Continue hctz with daily weight for monitoring.  11. Nausea/?GERD/poor po intake: On protonix. Will discontinue scheduled hydrocodone as may be contributing to symptoms.  Augment bowel program 12. Leucocytosis: Resolved  Likely reactive and resolving. Monitor for trend and signs of infection.   Cont to monitor  13. Chest pain/heaviness: Has been having for months and takes 'some medications".   14. Anxiety disorder: Multiple somatic complaints--monitor for now. Team to provide ego support.  15. Hypokalemia  K+ 3.8 on 7/2  Supplementing daily, decreased dose on 7/2 16. ABLA  Hb 10.1 on 7/3  Cont to monitor  LOS (Days) 6 A FACE TO FACE EVALUATION WAS PERFORMED  Delonda Coley Lorie Phenix 08/25/2016 8:44 AM

## 2016-08-25 NOTE — Progress Notes (Signed)
Talked to diabetes educator Tama Headings will see pt Friday 07/06 at 1300. Notified case worker and will put on schedule.

## 2016-08-25 NOTE — Progress Notes (Signed)
Physical Therapy Session Note  Patient Details  Name: Kimberly Lane MRN: [id number] Date of Birth: May 28, 1960  Today's Date: 08/25/2016 PT Individual Time: 1100-1200 PT Individual Time Calculation (min): 60 min   Short Term Goals: Week 1:  PT Short Term Goal 1 (Week 1): Pt will perform SB transfer with mod assist consistently PT Short Term Goal 2 (Week 1): Pt will perform bed mobility with min assist PT Short Term Goal 3 (Week 1): Pt will propell WC 134ft with min assist  PT Short Term Goal 4 (Week 1): Pt will be able to maintian sitting balance with supervision assist x 5 minutes without orthostasis.   Skilled Therapeutic Interventions/Progress Updates: Pt received supine in bed, evidence of pain as below in LLE residual limb. Translator used throughout session. Therapist had at length discussion with pt about wheelchair options, power vs manual, home accessibility, ability to transport chair, pros/cons to each chair. Pt verbalizing she is uninterested in a power chair, she knows her husband has been pushing for a power chair on her behalf but she reports she prefers the chair she already has; states "He keeps saying '[name] wants a power chair, [name] wants a power chair' but I don't care and I like my old chair". Discussed with pt that her preference is an important factor in deciding what to do about the wheelchair, and she states she would rather keep her manual chair so it can be used in the community. Upon deciding to keep manual chair, pt then reports "my hands and wrists have been hurting a lot lately, maybe the power chair would help". Discussed with pt that wrist/hand pain likely worsened as she has been pushing w/c more than she is used to which can lead to overuse injuries and pain. Pt agreeable to get into power chair to trial. A/P transfer into power chair with minA. Pt maneuvered w/c in room, hallway and into gym with minA to occasionally remove hand from steering column when pt unable to  slow down/stop before running into obstacles at lowest speed setting. Therapist to discuss with treatment team to determine best w/c option; suspect pt will benefit most from manual chair which can be used functionally in the home and community. Pt remained seated in w/c at end of session, all needs in reach and pt instructed not to drive w/c without therapy staff present, pt agreeable.      Therapy Documentation Precautions:  Precautions Precautions: Fall Restrictions Weight Bearing Restrictions: Yes RLE Weight Bearing: Weight bearing as tolerated LLE Weight Bearing: Non weight bearing Pain: Pain Assessment Pain Assessment: Faces Faces Pain Scale: Hurts a little bit Pain Type: Surgical pain Pain Location: Leg Pain Orientation: Left Pain Descriptors / Indicators: Discomfort Pain Onset: With Activity Pain Intervention(s): Repositioned   See Function Navigator for Current Functional Status.   Therapy/Group: Individual Therapy  Luberta Mutter 08/25/2016, 12:57 PM

## 2016-08-26 ENCOUNTER — Inpatient Hospital Stay (HOSPITAL_COMMUNITY): Payer: Medicaid Other | Admitting: Physical Therapy

## 2016-08-26 ENCOUNTER — Inpatient Hospital Stay (HOSPITAL_COMMUNITY): Payer: No Typology Code available for payment source | Admitting: Occupational Therapy

## 2016-08-26 LAB — GLUCOSE, CAPILLARY
GLUCOSE-CAPILLARY: 262 mg/dL — AB (ref 65–99)
GLUCOSE-CAPILLARY: 157 mg/dL — AB (ref 65–99)
Glucose-Capillary: 148 mg/dL — ABNORMAL HIGH (ref 65–99)
Glucose-Capillary: 148 mg/dL — ABNORMAL HIGH (ref 65–99)

## 2016-08-26 MED ORDER — INSULIN GLARGINE 100 UNIT/ML ~~LOC~~ SOLN
20.0000 [IU] | Freq: Every day | SUBCUTANEOUS | Status: DC
  Administered 2016-08-26 – 2016-08-27 (×2): 20 [IU] via SUBCUTANEOUS
  Filled 2016-08-26 (×2): qty 0.2

## 2016-08-26 MED ORDER — CITALOPRAM HYDROBROMIDE 20 MG PO TABS
30.0000 mg | ORAL_TABLET | Freq: Every day | ORAL | Status: DC
  Administered 2016-08-27 – 2016-09-01 (×6): 30 mg via ORAL
  Filled 2016-08-26 (×7): qty 1

## 2016-08-26 MED ORDER — TRAMADOL-ACETAMINOPHEN 37.5-325 MG PO TABS
1.0000 | ORAL_TABLET | Freq: Three times a day (TID) | ORAL | Status: DC
  Administered 2016-08-26 – 2016-09-01 (×23): 1 via ORAL
  Filled 2016-08-26 (×24): qty 1

## 2016-08-26 MED ORDER — GABAPENTIN 100 MG PO CAPS: 100.0000 mg | ORAL_CAPSULE | Freq: Two times a day (BID) | ORAL | Status: DC

## 2016-08-26 MED ORDER — GABAPENTIN 100 MG PO CAPS
100.0000 mg | ORAL_CAPSULE | Freq: Every day | ORAL | Status: DC
  Administered 2016-08-26 – 2016-08-28 (×3): 100 mg via ORAL
  Filled 2016-08-26 (×3): qty 1

## 2016-08-26 NOTE — Progress Notes (Signed)
Occupational Therapy Weekly Progress Note  Patient Details  Name: Kimberly Lane MRN: [id number] Date of Birth: 03-31-1960  Beginning of progress report period: August 20, 2016 End of progress report period: August 26, 2016  Today's Date: 08/26/2016 OT Individual Time: 1500-1530 OT Individual Time Calculation (min): 30 min    Patient has met 4 of 4 short term goals.  She continues to make steady progress with OT and currently completes most ADLs at a min assist to supervision level.  Min assist is needed for sliding board transfers to the wheelchair and the drop arm commode.  She demonstrates decreased efficiency with scooting secondary to not flexing trunk forward and utilizing RLE prosthesis to assist with transfer.  She still demonstrates limited endurance, requiring rest breaks during wheelchair mobility and with increased activity.  Pt reports dizziness and stomach pain at times as well but it does not seem to affect her participation in therapy.  Recommend continued CIR level therapy to progress to supervision level goals.  Anticipate discharge next week 7/12.    Patient continues to demonstrate the following deficits: muscle weakness and decreased sitting balance and decreased balance strategies and therefore will continue to benefit from skilled OT intervention to enhance overall performance with BADL.  Patient progressing toward long term goals..  Continue plan of care.  OT Short Term Goals Week 2:  OT Short Term Goal 1 (Week 2): Continue working on established supervision level goals for discharge.  Skilled Therapeutic Interventions/Progress Updates:    Pt completed transfer from supine to sit EOB with supervision and use of the bed rails.  Min assist for transfer to the wheelchair via sliding board before donning RLE prosthesis, (pt donned the liner and sock but wanted to get to the chair before donning prosthesis secondary to not being able to efficiently put it on at the EOB.  Mod assist  needed to put on prosthesis once in the wheelchair.  Had pt focus on wheelchair mobility in her chair rest of session secondary to time limitations.  She was able to propel her wheelchair down to the therapy gym with one rest break.  Attempted to push back but fire alarm sounded and therapist had to assist.  Pt left in wheelchair at end of session with call button in reach and safety belt in place.  Pt educated on use of the call button to call for assistance later to get back in the bed, and to not attempt transfer without assistance.  Pt voiced understanding.   Therapy Documentation Precautions:  Precautions Precautions: Fall Restrictions Weight Bearing Restrictions: Yes RLE Weight Bearing: Weight bearing as tolerated LLE Weight Bearing: Non weight bearing General: General OT Amount of Missed Time: 45 Minutes  Pain: Pain Assessment Pain Assessment: Faces Pain Score: Asleep Faces Pain Scale: Hurts a little bit Pain Type: Surgical pain Pain Location: Leg Pain Orientation: Left Pain Descriptors / Indicators: Discomfort Pain Intervention(s): Repositioned ADL: See Function Navigator for Current Functional Status.   Therapy/Group: Individual Therapy  Kimberly Lane OTR/L 08/26/2016, 3:37 PM

## 2016-08-26 NOTE — Progress Notes (Signed)
Nutrition Education Note  RD consulted for diet education regarding diabetes. Interpreter present. Handout regarding counting carbohydrates for people with diabetes from the Academy of Nutrition and Dietetics Manual given. Pt unwilling to listen and converse in diet education. RD emphasized the importance of limiting sugar sweetened beverages and desserts/candies. Pt reports she will try to limit these.  Corrin Parker, MS, RD, LDN Pager # [phone] After hours/ weekend pager # [phone]

## 2016-08-26 NOTE — Progress Notes (Signed)
Inpatient Diabetes Program Recommendations  AACE/ADA: New Consensus Statement on Inpatient Glycemic Control (2015)  Target Ranges:  Prepandial:   less than 140 mg/dL      Peak postprandial:   less than 180 mg/dL (1-2 hours)      Critically ill patients:  140 - 180 mg/dL   Spoke with patient about diabetes and home regimen for diabetes control. Patient reports that she sometimes takes her medications and sometimes not. She reports not checking her glucose. Patient told me that "I can keep my medications and she will keep the diabetes and if she dies sooner than later than she will be with her children." Patient has had a hard life. I did discussed basic pathophysiology of DM with patient and basic carbohydrates and portion sizes. Discussed A1c results and A1c and glucose goals. Patient told me she has tried doing what the MD says and it had not done anything in the past. Patient keeps putting her head in her pillows and putting her head in there hands throughout the conversation. Patient is tired of talking and asks minimal questions. Informed her of the Dietitian coming to see her.  Discussed importance of checking CBGs and maintaining good CBG control to prevent long-term and short-term complications.  Patient verbalized understanding of information discussed and declines to use insulin at time of discharge. Spoke with Reesa Chew, NP about patient case.    Thanks,  Tama Headings RN, MSN, [facility] Inpatient Diabetes Coordinator Team Pager [phone] (8a-5p)

## 2016-08-26 NOTE — Progress Notes (Signed)
Social Work Patient ID: Kimberly Lane, female   DOB: 03/04/1960, 56 y.o.   MRN: [id number]  Long discussion with pt, interpreter, Pam-PA, Holly-SW at [facility] and myself to discuss her eating, diabetes and  Her concerns. She is concerned staff feels she is being untruthful about her diabetes and what she is eating. Informed her want her to eat ok to eat what family brings from home and main concern She will heal and recover from this and she will need proper nutrition for this to happen. Pt expressed she wants a female to assist her with bathing and dressing, conveyed this to scheduler-Tonya. Listened to pt and will try to resolve her concerns. Holly-[facility] [address] to come back on Monday to see pt. Will work on discharge needs.

## 2016-08-26 NOTE — Progress Notes (Signed)
[name] PHYSICAL MEDICINE & REHABILITATION     PROGRESS NOTE  Subjective/Complaints:  Pt seen laying in bed this AM.  No reported issues.   ROS: Limited by language  Objective: Vital Signs: Blood pressure (!) 149/64, pulse 64, temperature 99.1 F (37.3 C), temperature source Oral, resp. rate 14, height 5\' 2"  (1.575 m), weight 54.4 kg (119 lb 14.9 oz), SpO2 99 %. No results found. No results for input(s): WBC, HGB, HCT, PLT in the last 72 hours. No results for input(s): NA, K, CL, GLUCOSE, BUN, CREATININE, CALCIUM in the last 72 hours.  Invalid input(s): CO CBG (last 3)   Recent Labs  08/25/16 1644 08/25/16 2106 08/26/16 0630  GLUCAP 172* 119* 262*    Wt Readings from Last 3 Encounters:  08/26/16 54.4 kg (119 lb 14.9 oz)  08/19/16 64.8 kg (142 lb 13.7 oz)  04/20/16 63.5 kg (140 lb)    Physical Exam:  BP (!) 149/64 (BP Location: Left Arm)   Pulse 64   Temp 99.1 F (37.3 C) (Oral)   Resp 14   Ht 5\' 2"  (1.575 m)   Wt 54.4 kg (119 lb 14.9 oz)   SpO2 99%   BMI 21.94 kg/m  Constitutional: She appears well-developed and well-nourished.  HENT: Normocephalic and atraumatic.  Eyes: EOMI. No discharge.   Cardiovascular: RRR.  No JVD. Respiratory: Effort normal and breath sounds normal.  GI: Soft. Bowel sounds are normal.   Musculoskeletal: She exhibits edema and tenderness.  Neurological: She is alert.  She is able to follow simple motor commands.    Motor: B/l UE 4+/5 proximal to distal RLE: HF, KE 4+/5 (stable) LLE: HF 4/5 (pain inhibition, unchanged)  Skin: L-AKA with staples in place, C/D/I.  Old R-BKA well healed.   Psychiatric: She is slowed. Cognition and memory are impaired   Assessment/Plan: 1. Functional deficits secondary to left AKA with history of right BKA which require 3+ hours per day of interdisciplinary therapy in a comprehensive inpatient rehab setting. Physiatrist is providing close team supervision and 24 hour management of active medical  problems listed below. Physiatrist and rehab team continue to assess barriers to discharge/monitor patient progress toward functional and medical goals.  Function:  Bathing Bathing position   Position: Bed  Bathing parts Body parts bathed by patient: Right arm, Left arm, Left upper leg, Right upper leg, Buttocks, Front perineal area, Abdomen, Chest Body parts bathed by helper: Back  Bathing assist Assist Level: Touching or steadying assistance(Pt > 75%)      Upper Body Dressing/Undressing Upper body dressing   What is the patient wearing?: Pull over shirt/dress     Pull over shirt/dress - Perfomed by patient: Thread/unthread right sleeve, Thread/unthread left sleeve, Put head through opening, Pull shirt over trunk          Upper body assist Assist Level: Set up      Lower Body Dressing/Undressing Lower body dressing   What is the patient wearing?: Pants     Pants- Performed by patient: Thread/unthread right pants leg, Thread/unthread left pants leg Pants- Performed by helper: Pull pants up/down                      Lower body assist Assist for lower body dressing: Touching or steadying assistance (Pt > 75%)      Toileting Toileting Toileting activity did not occur: No continent bowel/bladder event Toileting steps completed by patient: Performs perineal hygiene Toileting steps completed by helper: Performs perineal hygiene  Toileting Assistive Devices: Toilet aid  Toileting assist Assist level: Two helpers   Transfers Chair/bed transfer   Chair/bed transfer method: Anterior/posterior Chair/bed transfer assist level: Touching or steadying assistance (Pt > 75%) Chair/bed transfer assistive device: Armrests     Locomotion Ambulation Ambulation activity did not occur: Safety/medical concerns (BLE amputee)         Wheelchair   Type: Motorized Max wheelchair distance: 150 Assist Level: Touching or steadying assistance (Pt > 75%)  Cognition Comprehension  Comprehension assist level: Understands basic 90% of the time/cues < 10% of the time  Expression Expression assist level: Expresses basic 90% of the time/requires cueing < 10% of the time.  Social Interaction Social Interaction assist level: Interacts appropriately 90% of the time - Needs monitoring or encouragement for participation or interaction.  Problem Solving Problem solving assist level: Solves basic 75 - 89% of the time/requires cueing 10 - 24% of the time  Memory Memory assist level: Recognizes or recalls 75 - 89% of the time/requires cueing 10 - 24% of the time    Medical Problem List and Plan: 1.  Limitations in mobility, transfers, cognition, and self-care secondary to left AKA with history of right BKA.   Continue CIR 2.  DVT Prophylaxis/Anticoagulation: Pharmaceutical: Lovenox 3. Pain Management: D/Ced scheduled hydrocodone.   Added tylenol qid with ultram or oxycodone prn  4. Mood: LCSW to follow for evaluation and support.  5. Neuropsych: This patient is ?capable of making decisions on her own behalf. 6. Skin/Wound Care: monitor wound daily for healing. Change Glucerna (may be contributing to BS elevation)  to prosource for low protein stores.  7. Fluids/Electrolytes/Nutrition: Monitor I/Os 8. T2DM uncontrolled: Hgb A1C- 11.4.  Received decadron by mistake on 6/28 with elevated BS. Monitor BS for with ac/hs CBG checks  Will use SSI for elevated BS and titrate meds as indicated.   Lantus increased to 20 on 7/6  Novolog 6 TID   Remains extremely labile, variable intake and poor dietary choices contributing  Dietary consult for education  Will monitor for trend 9. HTN: Monitor BP bid  and titrate meds as indicated.  HCTZ DC'd and Chlorthalidone 50 mg started on 7/1, increased to 100 on 7/2   Cont norvasc.   Hydralizine 10 started 7/3, increased to 25 on 7/5  Monitor with increased mobility  Improving, will consider further adjusmtents 10. Acute fluid overload:  Lasix  discontinued. Continue hctz with daily weight for monitoring.  11. Nausea/?GERD/poor po intake: On protonix. Will discontinue scheduled hydrocodone as may be contributing to symptoms.  Augment bowel program 12. Leucocytosis: Resolved  Likely reactive and resolving. Monitor for trend and signs of infection.   Cont to monitor  13. Chest pain/heaviness: Has been having for months and takes 'some medications".   14. Anxiety disorder: Multiple somatic complaints--monitor for now. Team to provide ego support.  15. Hypokalemia  K+ 3.8 on 7/2  Supplementing daily, decreased dose on 7/2 16. ABLA  Hb 10.1 on 7/3  Cont to monitor  LOS (Days) 7 A FACE TO FACE EVALUATION WAS PERFORMED  Kimberly Lane Lorie Phenix 08/26/2016 10:09 AM

## 2016-08-26 NOTE — Progress Notes (Signed)
Occupational Therapy Session Note  Patient Details  Name: Kimberly Lane MRN: [id number] Date of Birth: 09/12/1960  Today's Date: 08/26/2016 OT Individual Time: 7680-8811 OT Individual Time Calculation (min): 56 min    Short Term Goals: Week 1:  OT Short Term Goal 1 (Week 1): Pt will transfer to DAC/3 in 1 with MOD A OT Short Term Goal 2 (Week 1): Pt will complete 2/3 steps of toileting on BSC with touching A OT Short Term Goal 3 (Week 1): Pt will don pants with touching A using lateral leans  OT Short Term Goal 4 (Week 1): Pt will complete 5 min of functional task without rest break to improve endurance required for BADLs  Skilled Therapeutic Interventions/Progress Updates:    Pt completed bathing and dressing sitting EOB.  Lateral leans with min guard assist for pulling pants over hips as well.  She was able to complete all UB selfcare with setup as well and no LOB.  Sliding board transfer to the wheelchair from EOB with min assist.  Mod instructional cueing to lean forward and use her RLE and prosthesis to assist with sliding across the surface.  Completed oral hygiene at the sink from wheelchair level.  Interpreter present as well.  Pt left in wheelchair with safety belt in place and call button in reach.    Therapy Documentation Precautions:  Precautions Precautions: Fall Restrictions Weight Bearing Restrictions: Yes RLE Weight Bearing: Weight bearing as tolerated LLE Weight Bearing: Non weight bearing  Pain: Pain Assessment Pain Assessment: Faces Faces Pain Scale: No hurt Pain Type: Surgical pain Pain Location: Leg Pain Orientation: Left Pain Descriptors / Indicators: Discomfort Pain Onset: With Activity Pain Intervention(s): Repositioned ADL: See Function Navigator for Current Functional Status.   Therapy/Group: Individual Therapy  Hurschel Paynter OTR/L 08/26/2016, 12:16 PM

## 2016-08-26 NOTE — Progress Notes (Signed)
Physical Therapy Session Note  Patient Details  Name: Kimberly Lane MRN: [id number] Date of Birth: 21-Jul-1960  Today's Date: 08/26/2016 PT Individual Time: 1102-1156 PT Individual Time Calculation (min): 54 min   Skilled Therapeutic Interventions/Progress Updates:    Interpretor present t/o session.  Session initiated with pt up in chair.  Pt propelled to gym to facilitate improved endurance in manual w/c with numerous rest breaks due to decreased endurance.  Session additionally focused on promoting improved functional independence with transfers.  Upon arriving at gym, pt reports needing to use restroom.  Pt propelled dependently back to rooma nd transferred to [facility] with sliding board and R leg donned with supervision.  Pt required total assist for peri hygiene as well as donning/doffing pants.  Pt additionally transferred back to w/c with sliding board and min assist.  Pt propelled again back to gym and transferred mat to chair with supervision and set up only for slide board placement.  Pt then returned to chair and was propelled by therapist back to room.  Pt doffed prosthesis independently and then performed A/P transfer to bed with supervision.  Pt demonstrated decreased safety and decreased independence with transferring using A/P compared to sliding board transfer with R LE donned.  At initiation of session, pt reports 2to 3/10 pain, however. Following reports increased pain.  Nursing notified and plans to issue meds ASAP.  Pt left in bed with call bell in reach, 3 rails up, and nursing notified of pt location  Therapy Documentation Precautions:  Precautions Precautions: Fall Restrictions Weight Bearing Restrictions: Yes RLE Weight Bearing: Weight bearing as tolerated LLE Weight Bearing: Non weight bearing     See Function Navigator for Current Functional Status.   Therapy/Group: Individual Therapy  Julina Altmann Hilario Quarry 08/26/2016, 12:50 PM

## 2016-08-27 ENCOUNTER — Inpatient Hospital Stay (HOSPITAL_COMMUNITY): Payer: Medicaid Other | Admitting: Physical Therapy

## 2016-08-27 LAB — GLUCOSE, CAPILLARY
GLUCOSE-CAPILLARY: 132 mg/dL — AB (ref 65–99)
Glucose-Capillary: 312 mg/dL — ABNORMAL HIGH (ref 65–99)
Glucose-Capillary: 74 mg/dL (ref 65–99)
Glucose-Capillary: 249 mg/dL — ABNORMAL HIGH (ref 65–99)

## 2016-08-27 NOTE — Progress Notes (Signed)
Initial Nutrition Assessment  DOCUMENTATION CODES:   Not applicable  INTERVENTION:   -Continue 30 ml Prostat BID, each supplement provides 100 kcals and 15 grams protein -Continue MVI daily -Snacks TID  NUTRITION DIAGNOSIS:   Increased nutrient needs related to wound healing as evidenced by estimated needs.  GOAL:   Patient will meet greater than or equal to 90% of their needs  MONITOR:   PO intake, Supplement acceptance, Labs, Weight trends, Skin, I & O's  REASON FOR ASSESSMENT:   Consult Poor PO  ASSESSMENT:   Pt admitted to CIR for limitations in mobility, transfers, cognition, and self-care secondary to left AKA with history of right BKA.   Pt lying in bed, covered in pillows and blankets at time of visit. No family at bedside. Unable to obtain further nutrition hx at this time. Nutrition-focused physical exam deferred at this time.   RD was consulted for poor oral intake and to assist with food preferences. Meal intake has been variable; PO: 25-100%. Of note, intake has improved within the past 24 hours, consuming 85-100% of meals (has consumed 1071 kcals and  55 grams of protein within the past two meal periods).  Reviewed wt hx, which revealed wt ranging between 119-123# since admission to CIR. Suspect prior wt changes may be related to amputations.   RD and DM coordinator attempted to educate pt on DM on 08/26/16, however, pt was not receptive.   Case discussed with unit RD, who reports that pt refused to engage in education session yesterday. She also reports pt family has been bringing in outside food for pt; RD has also spoken to pt regarding menu and food preferences. Per unit RD, team requesting addition of snacks.   Per MD notes, both Glucerna and Ensure have been discontinued due to hyperglycemic episodes. Prostat was ordered by MD in attempt to increase protein intake.   Labs reviewed: CBGS: 132-312. Hgb A1c: 11.4 (08/10/16).  Diet Order:  Diet Carb  Modified Fluid consistency: Thin; Room service appropriate? Yes  Skin:   (closed lt thigh incision)  Last BM:  08/26/16  Height:   Ht Readings from Last 1 Encounters:  08/24/16 5\' 2"  (1.575 m)    Weight:   Wt Readings from Last 1 Encounters:  08/26/16 119 lb 14.9 oz (54.4 kg)    Ideal Body Weight:  46.5 kg (adjusted for amputations )  BMI:  Body mass index is 21.94 kg/m. Adjusted BMI (for amputations): 25.6  Estimated Nutritional Needs:   Kcal:  1400-1600  Protein:  70-85 grams  Fluid:  1.4-1.6 L  EDUCATION NEEDS:   Education needs no appropriate at this time  Sarrah Fiorenza A. Jimmye Norman, RD, LDN, CDE Pager: [phone] After hours Pager: [phone]

## 2016-08-27 NOTE — Progress Notes (Signed)
[name] PHYSICAL MEDICINE & REHABILITATION     PROGRESS NOTE  Subjective/Complaints:  Just awakening. No new issues.   ROS: pt denies nausea, vomiting, diarrhea, cough, shortness of breath or chest pain   Objective: Vital Signs: Blood pressure 139/61, pulse 70, temperature 99 F (37.2 C), temperature source Oral, resp. rate 16, height 5\' 2"  (1.575 m), weight 54.4 kg (119 lb 14.9 oz), SpO2 100 %. No results found. No results for input(s): WBC, HGB, HCT, PLT in the last 72 hours. No results for input(s): NA, K, CL, GLUCOSE, BUN, CREATININE, CALCIUM in the last 72 hours.  Invalid input(s): CO CBG (last 3)   Recent Labs  08/26/16 1639 08/26/16 2058 08/27/16 0635  GLUCAP 148* 157* 132*    Wt Readings from Last 3 Encounters:  08/26/16 54.4 kg (119 lb 14.9 oz)  08/19/16 64.8 kg (142 lb 13.7 oz)  04/20/16 63.5 kg (140 lb)    Physical Exam:  BP 139/61 (BP Location: Left Arm)   Pulse 70   Temp 99 F (37.2 C) (Oral)   Resp 16   Ht 5\' 2"  (1.575 m)   Wt 54.4 kg (119 lb 14.9 oz)   SpO2 100%   BMI 21.94 kg/m  Constitutional: She appears well-developed and well-nourished.  HENT: Normocephalic and atraumatic.  Eyes: EOMI. No discharge.   Cardiovascular: RRR without murmur. No JVD  Respiratory: Effort normal and breath sounds normal.  GI: Soft. Bowel sounds are normal.   Musculoskeletal: She exhibits edema and tenderness.  Neurological: She is alert.  She is able to follow simple motor commands.    Motor: B/l UE 4+/5 proximal to distal RLE: HF, KE 4+/5 (stable) LLE: HF 4/5 (pain inhibition, unchanged)  Skin: L-AKA with staples in place, C/D/I.  Old R-BKA well healed.   Psychiatric: She is slowed. Cognition and memory are impaired   Assessment/Plan: 1. Functional deficits secondary to left AKA with history of right BKA which require 3+ hours per day of interdisciplinary therapy in a comprehensive inpatient rehab setting. Physiatrist is providing close team supervision  and 24 hour management of active medical problems listed below. Physiatrist and rehab team continue to assess barriers to discharge/monitor patient progress toward functional and medical goals.  Function:  Bathing Bathing position   Position: Bed  Bathing parts Body parts bathed by patient: Right arm, Left arm, Left upper leg, Right upper leg, Buttocks, Front perineal area, Abdomen, Chest Body parts bathed by helper: Back  Bathing assist Assist Level: Touching or steadying assistance(Pt > 75%)      Upper Body Dressing/Undressing Upper body dressing   What is the patient wearing?: Pull over shirt/dress     Pull over shirt/dress - Perfomed by patient: Thread/unthread right sleeve, Thread/unthread left sleeve, Put head through opening, Pull shirt over trunk          Upper body assist Assist Level: Set up      Lower Body Dressing/Undressing Lower body dressing   What is the patient wearing?: Pants     Pants- Performed by patient: Thread/unthread right pants leg, Thread/unthread left pants leg, Pull pants up/down Pants- Performed by helper: Pull pants up/down                      Lower body assist Assist for lower body dressing: Touching or steadying assistance (Pt > 75%)      Toileting Toileting Toileting activity did not occur: No continent bowel/bladder event Toileting steps completed by patient: Performs perineal hygiene Toileting  steps completed by helper: Performs perineal hygiene Toileting Assistive Devices: Toilet aid  Toileting assist Assist level: Two helpers   Transfers Chair/bed transfer   Chair/bed transfer method: Lateral scoot Chair/bed transfer assist level: Supervision or verbal cues Chair/bed transfer assistive device: Sliding board     Locomotion Ambulation Ambulation activity did not occur: Safety/medical concerns (BLE amputee)         Wheelchair   Type: Motorized Max wheelchair distance:  (150 ft) Assist Level: Supervision or verbal  cues  Cognition Comprehension Comprehension assist level: Other (comment) (difficult to assess due to language barrier)  Expression Expression assist level:  (difficult to assess R/T language barrier)  Social Interaction Social Interaction assist level: Interacts appropriately 90% of the time - Needs monitoring or encouragement for participation or interaction.  Problem Solving Problem solving assist level: Solves basic 25 - 49% of the time - needs direction more than half the time to initiate, plan or complete simple activities  Memory Memory assist level: Recognizes or recalls 75 - 89% of the time/requires cueing 10 - 24% of the time    Medical Problem List and Plan: 1.  Limitations in mobility, transfers, cognition, and self-care secondary to left AKA with history of right BKA.   Continue CIR 2.  DVT Prophylaxis/Anticoagulation: Pharmaceutical: Lovenox 3. Pain Management: D/Ced scheduled hydrocodone.   Added tylenol qid with ultram or oxycodone prn  4. Mood: LCSW to follow for evaluation and support.  5. Neuropsych: This patient is ?capable of making decisions on her own behalf. 6. Skin/Wound Care: monitor wound daily for healing. Change Glucerna (may be contributing to BS elevation)  to prosource for low protein stores.  7. Fluids/Electrolytes/Nutrition: Monitor I/Os 8. T2DM uncontrolled: Hgb A1C- 11.4.  Received decadron by mistake on 6/28 with elevated BS. Monitor BS for with ac/hs CBG checks  Will use SSI for elevated BS and titrate meds as indicated.   Lantus increased to 20 on 7/6  Novolog 6 TID   -fair control over last 24 hours.  9. HTN: Monitor BP bid  and titrate meds as indicated.  HCTZ DC'd and Chlorthalidone 50 mg started on 7/1, increased to 100 on 7/2   Cont norvasc.   Hydralizine 10 started 7/3, increased to 25 on 7/5  Monitor with increased mobility  Improving, will consider further adjusmtents 10. Acute fluid overload:  Lasix discontinued. Continue hctz with daily  weight for monitoring.  11. Nausea/?GERD/poor po intake: On protonix. Will discontinue scheduled hydrocodone as may be contributing to symptoms.  Augment bowel program 12. Leucocytosis: Resolved  Likely reactive and resolving. Monitor for trend and signs of infection.   Cont to monitor  13. Chest pain/heaviness: Has been having for months and takes 'some medications".   14. Anxiety disorder: Multiple somatic complaints--monitor for now. Team to provide ego support.  15. Hypokalemia  K+ 3.8 on 7/2  Supplementing daily, decreased dose on 7/2 16. ABLA  Hb 10.1 on 7/3  Cont to monitor  LOS (Days) 8 A FACE TO FACE EVALUATION WAS PERFORMED  Kimberly Lane T 08/27/2016 9:15 AM

## 2016-08-27 NOTE — Progress Notes (Signed)
Physical Therapy Session Note  Patient Details  Name: Kimberly Lane MRN: [id number] Date of Birth: [birth year]  Today's Date: 08/27/2016 PT Individual Time: 1402-1500 PT Individual Time Calculation (min): 58 min    Skilled Therapeutic Interventions/Progress Updates:    Pt states that she has some pain but does not rate it.  Session initiated with pt lying in the bed.  Pt performed upper and lower body dressing with moderate assistance from therapist and donned silicone sleeve on R LE independently and R LE prosthesis with total assist from PT.  Pt transferred numerous times bed to chair using sliding board and supervision.  Pt sat edge of bed during PT and worked on sitting endurance by playing catch with beach ball and then lying in supine position with wedge under back to work towards decreasing L hip flexor tightness.  Upon attempting to lie flat, pt was unable to place L limb on table due to tightness.  Therapist educated pt on need to work on lowering head of bed slightly during the day to allow hips to stretch.  Pt verbalized understanding through interpretor.  Following session, pt left sitting up in chair with interpretor in room awaiting nursing to assist pt with bathing.  Therapy Documentation Precautions:  Precautions Precautions: Fall Restrictions Weight Bearing Restrictions: Yes RLE Weight Bearing: Weight bearing as tolerated LLE Weight Bearing: Non weight bearing    Vital Signs: Therapy Vitals Temp: 98.2 F (36.8 C) Temp Source: Oral Pulse Rate: 84 Resp: 16 BP: (!) 145/74 Patient Position (if appropriate): Lying Oxygen Therapy SpO2: 96 % O2 Device: Not Delivered      See Function Navigator for Current Functional Status.   Therapy/Group: Individual Therapy  Brihany Butch Hilario Quarry 08/27/2016, 4:02 PM

## 2016-08-28 LAB — GLUCOSE, CAPILLARY
GLUCOSE-CAPILLARY: 219 mg/dL — AB (ref 65–99)
GLUCOSE-CAPILLARY: 298 mg/dL — AB (ref 65–99)
Glucose-Capillary: 250 mg/dL — ABNORMAL HIGH (ref 65–99)
Glucose-Capillary: 55 mg/dL — ABNORMAL LOW (ref 65–99)
Glucose-Capillary: 121 mg/dL — ABNORMAL HIGH (ref 65–99)

## 2016-08-28 MED ORDER — INSULIN GLARGINE 100 UNIT/ML ~~LOC~~ SOLN
24.0000 [IU] | Freq: Every day | SUBCUTANEOUS | Status: DC
  Administered 2016-08-28 – 2016-08-29 (×2): 24 [IU] via SUBCUTANEOUS
  Filled 2016-08-28 (×2): qty 0.24

## 2016-08-28 NOTE — Progress Notes (Signed)
Hypoglycemic Event  CBG: 55  Treatment: 15 GM carbohydrate snack  Symptoms: None  Follow-up CBG: Time:1730 CBG Result:121  Possible Reasons for Event: Unknown  Comments/MD notified:Dr. Christella Noa, [facility]

## 2016-08-28 NOTE — Progress Notes (Signed)
[place] PHYSICAL MEDICINE & REHABILITATION     PROGRESS NOTE  Subjective/Complaints:  Slept well. Husband at bedside. Pain controlled  ROS: Limited due to language   Objective: Vital Signs: Blood pressure (!) 157/62, pulse 75, temperature 99.1 F (37.3 C), temperature source Oral, resp. rate 18, height 5\' 2"  (1.575 m), weight 55.5 kg (122 lb 5.7 oz), SpO2 100 %. No results found. No results for input(s): WBC, HGB, HCT, PLT in the last 72 hours. No results for input(s): NA, K, CL, GLUCOSE, BUN, CREATININE, CALCIUM in the last 72 hours.  Invalid input(s): CO CBG (last 3)   Recent Labs  08/27/16 1727 08/27/16 2132 08/28/16 0628  GLUCAP 74 249* 219*    Wt Readings from Last 3 Encounters:  08/28/16 55.5 kg (122 lb 5.7 oz)  08/19/16 64.8 kg (142 lb 13.7 oz)  04/20/16 63.5 kg (140 lb)    Physical Exam:  BP (!) 157/62 (BP Location: Left Arm)   Pulse 75   Temp 99.1 F (37.3 C) (Oral)   Resp 18   Ht 5\' 2"  (1.575 m)   Wt 55.5 kg (122 lb 5.7 oz)   SpO2 100%   BMI 22.38 kg/m  Constitutional: She appears well-developed and well-nourished.  HENT: Normocephalic and atraumatic.  Eyes: EOMI. No discharge.   Cardiovascular: RRR without murmur. No JVD   Respiratory: Effort normal and breath sounds normal.  GI: Soft. Bowel sounds are normal.   Musculoskeletal: She exhibits edema and tenderness.  Neurological: She is alert.  She is able to follow simple motor commands.    Motor: B/l UE 4+/5 proximal to distal RLE: HF, KE 4+/5 (stable) LLE: HF 4/5 (pain inhibition, unchanged)  Skin: L-AKA with staples in place, C/D/I.  Old R-BKA well healed.   Psychiatric: She is slowed. Cognition and memory are impaired   Assessment/Plan: 1. Functional deficits secondary to left AKA with history of right BKA which require 3+ hours per day of interdisciplinary therapy in a comprehensive inpatient rehab setting. Physiatrist is providing close team supervision and 24 hour management of active  medical problems listed below. Physiatrist and rehab team continue to assess barriers to discharge/monitor patient progress toward functional and medical goals.  Function:  Bathing Bathing position   Position: Bed  Bathing parts Body parts bathed by patient: Right arm, Left arm, Left upper leg, Right upper leg, Buttocks, Front perineal area, Abdomen, Chest Body parts bathed by helper: Back  Bathing assist Assist Level: Touching or steadying assistance(Pt > 75%)      Upper Body Dressing/Undressing Upper body dressing   What is the patient wearing?: Pull over shirt/dress     Pull over shirt/dress - Perfomed by patient: Thread/unthread right sleeve, Thread/unthread left sleeve, Put head through opening, Pull shirt over trunk          Upper body assist Assist Level: Set up      Lower Body Dressing/Undressing Lower body dressing   What is the patient wearing?: Pants     Pants- Performed by patient: Thread/unthread right pants leg, Thread/unthread left pants leg, Pull pants up/down Pants- Performed by helper: Pull pants up/down                      Lower body assist Assist for lower body dressing: Touching or steadying assistance (Pt > 75%)      Toileting Toileting Toileting activity did not occur: No continent bowel/bladder event Toileting steps completed by patient: Performs perineal hygiene Toileting steps completed by helper:  Performs perineal hygiene Toileting Assistive Devices: Toilet aid  Toileting assist Assist level: Two helpers   Transfers Chair/bed transfer   Chair/bed transfer method: Lateral scoot Chair/bed transfer assist level: Set up only Chair/bed transfer assistive device: Sliding board     Locomotion Ambulation Ambulation activity did not occur: Safety/medical concerns (BLE amputee)         Wheelchair   Type: Manual Max wheelchair distance:  (150 ft) Assist Level: Supervision or verbal cues  Cognition Comprehension Comprehension  assist level: Other (comment) (difficult to assess due to language barrier)  Expression Expression assist level:  (difficult to assess R/T language barrier)  Social Interaction Social Interaction assist level: Interacts appropriately 90% of the time - Needs monitoring or encouragement for participation or interaction.  Problem Solving Problem solving assist level: Solves basic 25 - 49% of the time - needs direction more than half the time to initiate, plan or complete simple activities  Memory Memory assist level: Recognizes or recalls 75 - 89% of the time/requires cueing 10 - 24% of the time    Medical Problem List and Plan: 1.  Limitations in mobility, transfers, cognition, and self-care secondary to left AKA with history of right BKA.   Continue CIR  2.  DVT Prophylaxis/Anticoagulation: Pharmaceutical: Lovenox 3. Pain Management: D/Ced scheduled hydrocodone.   Added tylenol qid with ultram or oxycodone prn--reasonable control at present 4. Mood: LCSW to follow for evaluation and support.  5. Neuropsych: This patient is ?capable of making decisions on her own behalf. 6. Skin/Wound Care: monitor wound daily for healing. Change Glucerna (may be contributing to BS elevation)  to prosource for low protein stores.  7. Fluids/Electrolytes/Nutrition: Monitor I/Os 8. T2DM uncontrolled: Hgb A1C- 11.4.  Received decadron by mistake on 6/28 with elevated BS. Monitor BS for with ac/hs CBG checks  Will use SSI for elevated BS and titrate meds as indicated.   Lantus increased to 20 on 7/6--increase to 24u tonight  Novolog 6 TID   -labile numbers over last 24 hr 9. HTN: Monitor BP bid  and titrate meds as indicated.  HCTZ DC'd and Chlorthalidone 50 mg started on 7/1, increased to 100 on 7/2   Cont norvasc.   Hydralizine 10 started 7/3, increased to 25 on 7/5  Monitor with increased mobility  Improving control 10. Acute fluid overload:  Lasix discontinued. Continue hctz with daily weight for  monitoring.  11. Nausea/?GERD/poor po intake: On protonix. Will discontinue scheduled hydrocodone as may be contributing to symptoms.  Augment bowel program 12. Leucocytosis: Resolved  Likely reactive and resolving. Monitor for trend and signs of infection.   Cont to monitor  13. Chest pain/heaviness: Has been having for months and takes 'some medications".   14. Anxiety disorder: Multiple somatic complaints--monitor for now. Team to provide ego support.  15. Hypokalemia  K+ 3.8 on 7/2  Supplementing daily, decreased dose on 7/2 16. ABLA  Hb 10.1 on 7/3  Cont to monitor  LOS (Days) 9 A FACE TO FACE EVALUATION WAS PERFORMED  Shakeem Stern T 08/28/2016 7:52 AM

## 2016-08-29 ENCOUNTER — Inpatient Hospital Stay (HOSPITAL_COMMUNITY): Payer: No Typology Code available for payment source | Admitting: Occupational Therapy

## 2016-08-29 ENCOUNTER — Inpatient Hospital Stay (HOSPITAL_COMMUNITY): Payer: No Typology Code available for payment source

## 2016-08-29 ENCOUNTER — Inpatient Hospital Stay (HOSPITAL_COMMUNITY): Payer: Medicaid Other | Admitting: Physical Therapy

## 2016-08-29 LAB — GLUCOSE, CAPILLARY
GLUCOSE-CAPILLARY: 146 mg/dL — AB (ref 65–99)
Glucose-Capillary: 183 mg/dL — ABNORMAL HIGH (ref 65–99)
Glucose-Capillary: 61 mg/dL — ABNORMAL LOW (ref 65–99)
Glucose-Capillary: 90 mg/dL (ref 65–99)
Glucose-Capillary: 300 mg/dL — ABNORMAL HIGH (ref 65–99)

## 2016-08-29 MED ORDER — METHOCARBAMOL 500 MG PO TABS
500.0000 mg | ORAL_TABLET | Freq: Three times a day (TID) | ORAL | Status: DC
  Administered 2016-08-29 – 2016-09-01 (×8): 500 mg via ORAL
  Filled 2016-08-29 (×8): qty 1

## 2016-08-29 MED ORDER — GABAPENTIN 100 MG PO CAPS
100.0000 mg | ORAL_CAPSULE | Freq: Two times a day (BID) | ORAL | Status: DC
  Administered 2016-08-29 – 2016-08-31 (×5): 100 mg via ORAL
  Filled 2016-08-29 (×5): qty 1

## 2016-08-29 NOTE — Progress Notes (Signed)
Occupational Therapy Session Note  Patient Details  Name: Kimberly Lane MRN: [id number] Date of Birth: [birth year]  Today's Date: 08/29/2016 OT Individual Time: 6734-1937 OT Individual Time Calculation (min): 60 min    Short Term Goals: Week 2:  OT Short Term Goal 1 (Week 2): Continue working on established supervision level goals for discharge.  Skilled Therapeutic Interventions/Progress Updates:    Pt seen this session for ADL retraining and sitting balance exercises. Pt received in room with RN using BSC. RN reports that pt completed posterior transfer with no physical A.  RN had hands on pt's torso for safety.  Pt completed toileted and anterior transfer back to bed with S.   From bed level in sitting, pt bathed, dressed and donned R prosthesis all with set up only.  Used slide board to w/c with set up only. Pt propelled self in w/c to gym (approx. 150 ft) and then used board with set up to transfer to mat.  Pt worked on dynamic reaching activities to reach for clothespins out of her pelvic base of support. Pt did well maintaining her balance.  Pt worked on over head reaching with BUE and had no LOB.  She transferred back to wc and propelled back to her room . Pt resting in room with all needs met, quick release belt on and call light in reach.  Therapy Documentation Precautions:  Precautions Precautions: Fall Restrictions Weight Bearing Restrictions: Yes RLE Weight Bearing: Weight bearing as tolerated LLE Weight Bearing: Non weight bearing  Pain: Pain Assessment Pain Assessment: No/denies pain ADL:   See Function Navigator for Current Functional Status.   Therapy/Group: Individual Therapy  [place] 08/29/2016, 11:48 AM

## 2016-08-29 NOTE — Progress Notes (Signed)
Physical Therapy Weekly Progress Note  Patient Details  Name: Cathleen Yagi MRN: [id number] Date of Birth: March 21, 1960  Beginning of progress report period: August 20, 2016 End of progress report period: August 29, 2016  Today's Date: 08/29/2016 PT Individual Time: 1100-1200 PT Individual Time Calculation (min): 60 min  [facility] representatives present upon arrival and observed some of session. Discussed goals for eventual floor transfers due to many aspects of pt's culture involve getting onto the floor. Demonstrated and educated on modified techniques to work on such as using steps to tier the transfer down to the floor. Did not attempt with pt today. Addressed sit <> Stand from mat with RW but pt unable despite max assist. Tried in parallel bars and pt able to complete x 4 reps with max assist - difficulty getting to full upright posture but able to weighshift forward enough to clear bottom and initiate sit <> stand holding for about 15 seconds each repetition. Mod assist squat/scoot pivot transfers on and off of Nustep (uneven surface) with cues for hand placement and technique. Nustep x 6 min on level 2 for functional strengthening and endurance. W/c mobility training at supervision level for cues for efficiency and parts management during transfer set-up.   Patient has met 4 of 4 short term goals.  Pt is making good functional progress with mobility. Currently supervision assist for transfers using slideboard w/c level with assist for board placement. Requires mod assist for squat pivot technque. Planning for d/c this week.   Patient continues to demonstrate the following deficits muscle weakness and muscle joint tightness, decreased cardiorespiratoy endurance and decreased standing balance and decreased balance strategies and therefore will continue to benefit from skilled PT intervention to increase functional independence with mobility.  Patient progressing toward long term goals..  Continue plan of  care.  PT Short Term Goals Week 1:  PT Short Term Goal 1 (Week 1): Pt will perform SB transfer with mod assist consistently PT Short Term Goal 1 - Progress (Week 1): Met PT Short Term Goal 2 (Week 1): Pt will perform bed mobility with min assist PT Short Term Goal 2 - Progress (Week 1): Met PT Short Term Goal 3 (Week 1): Pt will propell WC 143f with min assist  PT Short Term Goal 3 - Progress (Week 1): Met PT Short Term Goal 4 (Week 1): Pt will be able to maintian sitting balance with supervision assist x 5 minutes without orthostasis.  PT Short Term Goal 4 - Progress (Week 1): Met Week 2:  PT Short Term Goal 1 (Week 2): = LTGs  Skilled Therapeutic Interventions/Progress Updates:  Balance/vestibular training;Cognitive remediation/compensation;Community reintegration;Discharge planning;Disease management/prevention;DME/adaptive equipment instruction;Functional electrical stimulation;Functional mobility training;Pain management;Neuromuscular re-education;Patient/family education;Psychosocial support;Skin care/wound management;Therapeutic Exercise;Splinting/orthotics;Stair training;Therapeutic Activities;UE/LE Strength taining/ROM;UE/LE Coordination activities;Wheelchair propulsion/positioning;Visual/perceptual remediation/compensation;Ambulation/gait training   Therapy Documentation Precautions:  Precautions Precautions: Fall Restrictions Weight Bearing Restrictions: Yes RLE Weight Bearing: Weight bearing as tolerated LLE Weight Bearing: Non weight bearing  Pain: Pain Assessment Pain Assessment: No/denies pain  See Function Navigator for Current Functional Status.  Therapy/Group: Individual Therapy  GCanary BrimBIvory Broad PT, DPT  08/29/2016, 12:06 PM

## 2016-08-29 NOTE — Progress Notes (Signed)
Occupational Therapy Session Note  Patient Details  Name: Kimberly Lane MRN: [id number] Date of Birth: 08-26-1960  Today's Date: 08/29/2016 OT Individual Time: 1300-1416 OT Individual Time Calculation (min): 76 min    Short Term Goals: Week 2:  OT Short Term Goal 1 (Week 2): Continue working on established supervision level goals for discharge.  Skilled Therapeutic Interventions/Progress Updates:    Pt completed sliding board transfer from wheelchair to drop arm commode with supervision and mod instructional cueing for setup of equipment.  Therapist helped position the board and then she was able to scoot across with supervision.  Once on the commode, had pt work on Airline pilot in confined space.  Pt again needing mod demonstrational cueing for technique to complete lateral leans side to side for managing clothing in sitting, with simulated theraband.  Next had pt propel herself down to the ADL apartment for practice with simple home management task of making up the bed.  She was able to use her wheelchair to maneuver around the bed and apply the fitted sheet and comforter with increased time and mod demonstrational cueing.  Returned to room at conclusion of session with transfer back to the EOB with supervision using the sliding board.  Once on the EOB she removed her prosthesis.  Pt remained sitting EOB per her request with bedside table and call button in reach.    Therapy Documentation Precautions:  Precautions Precautions: Fall Restrictions Weight Bearing Restrictions: No RLE Weight Bearing: Weight bearing as tolerated LLE Weight Bearing: Non weight bearing  Pain: Pain Assessment Pain Assessment: Faces Faces Pain Scale: Hurts a little bit Pain Type: Surgical pain Pain Location: Leg Pain Orientation: Left Pain Descriptors / Indicators: Discomfort Pain Onset: With Activity Pain Intervention(s): Repositioned ADL:  See Function Navigator for Current Functional  Status.   Therapy/Group: Individual Therapy  Daneesha Quinteros OTR/L 08/29/2016, 2:39 PM

## 2016-08-29 NOTE — Progress Notes (Signed)
[facility] PHYSICAL MEDICINE & REHABILITATION     PROGRESS NOTE  Subjective/Complaints:  Pt seen laying in bed this AM.  She responds "okay" to all questions.   ROS: Limited due to language   Objective: Vital Signs: Blood pressure (!) 167/63, pulse 75, temperature 99.1 F (37.3 C), temperature source Oral, resp. rate 16, height 5\' 2"  (1.575 m), weight 54.8 kg (120 lb 13 oz), SpO2 100 %. No results found. No results for input(s): WBC, HGB, HCT, PLT in the last 72 hours. No results for input(s): NA, K, CL, GLUCOSE, BUN, CREATININE, CALCIUM in the last 72 hours.  Invalid input(s): CO CBG (last 3)   Recent Labs  08/28/16 1658 08/28/16 2156 08/29/16 0633  GLUCAP 121* 298* 183*    Wt Readings from Last 3 Encounters:  08/29/16 54.8 kg (120 lb 13 oz)  08/19/16 64.8 kg (142 lb 13.7 oz)  04/20/16 63.5 kg (140 lb)    Physical Exam:  BP (!) 167/63 (BP Location: Left Arm)   Pulse 75   Temp 99.1 F (37.3 C) (Oral)   Resp 16   Ht 5\' 2"  (1.575 m)   Wt 54.8 kg (120 lb 13 oz)   SpO2 100%   BMI 22.10 kg/m  Constitutional: She appears well-developed and well-nourished.  HENT: Normocephalic and atraumatic.  Eyes: EOMI. No discharge.   Cardiovascular: RRR. No JVD   Respiratory: Effort normal and breath sounds normal.  GI: Soft. Bowel sounds are normal.   Musculoskeletal: She exhibits edema and mild tenderness.  Neurological: She is alert.  She is able to follow simple motor commands.    Motor: B/l UE 4+/5 proximal to distal RLE: HF, KE 4+/5 (stable) LLE: HF 4/5 (pain inhibition, stable)  Skin: L-AKA with staples in place, C/D/I.  Old R-BKA well healed.   Psychiatric: She is slowed. Cognition and memory are impaired   Assessment/Plan: 1. Functional deficits secondary to left AKA with history of right BKA which require 3+ hours per day of interdisciplinary therapy in a comprehensive inpatient rehab setting. Physiatrist is providing close team supervision and 24 hour management  of active medical problems listed below. Physiatrist and rehab team continue to assess barriers to discharge/monitor patient progress toward functional and medical goals.  Function:  Bathing Bathing position   Position: Bed  Bathing parts Body parts bathed by patient: Right arm, Left arm, Left upper leg, Right upper leg, Buttocks, Front perineal area, Abdomen, Chest Body parts bathed by helper: Back  Bathing assist Assist Level: Touching or steadying assistance(Pt > 75%)      Upper Body Dressing/Undressing Upper body dressing   What is the patient wearing?: Pull over shirt/dress     Pull over shirt/dress - Perfomed by patient: Thread/unthread right sleeve, Thread/unthread left sleeve, Put head through opening, Pull shirt over trunk          Upper body assist Assist Level: Set up      Lower Body Dressing/Undressing Lower body dressing   What is the patient wearing?: Pants     Pants- Performed by patient: Thread/unthread right pants leg, Thread/unthread left pants leg, Pull pants up/down Pants- Performed by helper: Pull pants up/down                      Lower body assist Assist for lower body dressing: Touching or steadying assistance (Pt > 75%)      Toileting Toileting Toileting activity did not occur: No continent bowel/bladder event Toileting steps completed by patient: Performs  perineal hygiene Toileting steps completed by helper: Adjust clothing prior to toileting, Adjust clothing after toileting Toileting Assistive Devices: Toilet aid  Toileting assist Assist level: Two helpers   Transfers Chair/bed transfer   Chair/bed transfer method: Lateral scoot Chair/bed transfer assist level: Set up only Chair/bed transfer assistive device: Sliding board     Locomotion Ambulation Ambulation activity did not occur: Safety/medical concerns (BLE amputee)         Wheelchair   Type: Manual Max wheelchair distance:  (150 ft) Assist Level: Supervision or  verbal cues  Cognition Comprehension Comprehension assist level: Other (comment) (difficult to assess due to language barrier)  Expression Expression assist level:  (difficult to assess R/T language barrier)  Social Interaction Social Interaction assist level: Interacts appropriately 90% of the time - Needs monitoring or encouragement for participation or interaction.  Problem Solving Problem solving assist level: Solves basic 25 - 49% of the time - needs direction more than half the time to initiate, plan or complete simple activities  Memory Memory assist level: Recognizes or recalls 75 - 89% of the time/requires cueing 10 - 24% of the time    Medical Problem List and Plan: 1.  Limitations in mobility, transfers, cognition, and self-care secondary to left AKA with history of right BKA.   Continue CIR  Lengthy discussion with patient and family by PA and discussion with PA regarding recent treatment, pt concerns, diet issues, etc 2.  DVT Prophylaxis/Anticoagulation: Pharmaceutical: Lovenox 3. Pain Management: D/Ced scheduled hydrocodone.   Added tylenol qid with ultram or oxycodone prn  Gabapentin scheduled for phantom limb pain 4. Mood: LCSW to follow for evaluation and support.  5. Neuropsych: This patient is ?capable of making decisions on her own behalf. 6. Skin/Wound Care: monitor wound daily for healing. Change Glucerna (may be contributing to BS elevation)  to prosource for low protein stores.  7. Fluids/Electrolytes/Nutrition: Monitor I/Os 8. T2DM uncontrolled: Hgb A1C- 11.4.  Received decadron by mistake on 6/28 with elevated BS. Monitor BS for with ac/hs CBG checks  Will use SSI for elevated BS and titrate meds as indicated.   Lantus increased to 20 on 7/6, increased to 24u 7/8  Novolog 6 TID   -Remains extremely labile with hypoglycemia 9. HTN: Monitor BP bid  and titrate meds as indicated.  HCTZ DC'd and Chlorthalidone 50 mg started on 7/1, increased to 100 on 7/2   Cont  norvasc.   Hydralizine 10 started 7/3, increased to 25 on 7/5  Monitor with increased mobility  Elevated last 12 hours, cont to monitor 10. Acute fluid overload:  Lasix discontinued. Continue hctz with daily weight for monitoring.  11. Nausea/?GERD/poor po intake: On protonix. Will discontinue scheduled hydrocodone as may be contributing to symptoms.  Augment bowel program 12. Leucocytosis: Resolved  Likely reactive and resolving. Monitor for trend and signs of infection.   Cont to monitor  13. Chest pain/heaviness: Has been having for months and takes 'some medications".   14. Anxiety disorder: Multiple somatic complaints--monitor for now. Team to provide ego support.  15. Hypokalemia  K+ 3.8 on 7/2  Supplementing daily, decreased dose on 7/2  Labs ordered for tomorrow 16. ABLA  Hb 10.1 on 7/3  Labs ordered for tomorrow  Cont to monitor  >35 minutes spent with >30 minutes in counseling ar coordination of care regarding multiple medical issues as well as discharge.   LOS (Days) 10 A FACE TO FACE EVALUATION WAS PERFORMED  Ankit Lorie Phenix 08/29/2016 9:13 AM

## 2016-08-30 ENCOUNTER — Inpatient Hospital Stay (HOSPITAL_COMMUNITY): Payer: No Typology Code available for payment source | Admitting: Occupational Therapy

## 2016-08-30 ENCOUNTER — Inpatient Hospital Stay (HOSPITAL_COMMUNITY): Payer: Medicaid Other

## 2016-08-30 LAB — CBC
HEMATOCRIT: 32 % — AB (ref 36.0–46.0)
HEMOGLOBIN: 9.8 g/dL — AB (ref 12.0–15.0)
MCH: 24.3 pg — AB (ref 26.0–34.0)
MCHC: 30.6 g/dL (ref 30.0–36.0)
MCV: 79.2 fL (ref 78.0–100.0)
Platelets: 406 10*3/uL — ABNORMAL HIGH (ref 150–400)
RBC: 4.04 MIL/uL (ref 3.87–5.11)
RDW: 16.7 % — ABNORMAL HIGH (ref 11.5–15.5)
WBC: 5.9 10*3/uL (ref 4.0–10.5)

## 2016-08-30 LAB — GLUCOSE, CAPILLARY
GLUCOSE-CAPILLARY: 131 mg/dL — AB (ref 65–99)
GLUCOSE-CAPILLARY: 152 mg/dL — AB (ref 65–99)
GLUCOSE-CAPILLARY: 55 mg/dL — AB (ref 65–99)
GLUCOSE-CAPILLARY: 528 mg/dL — AB (ref 65–99)
Glucose-Capillary: 89 mg/dL (ref 65–99)
Glucose-Capillary: 242 mg/dL — ABNORMAL HIGH (ref 65–99)

## 2016-08-30 LAB — BASIC METABOLIC PANEL
Anion gap: 8 (ref 5–15)
BUN: 12 mg/dL (ref 6–20)
CHLORIDE: 99 mmol/L — AB (ref 101–111)
CO2: 30 mmol/L (ref 22–32)
Calcium: 9 mg/dL (ref 8.9–10.3)
Creatinine, Ser: 0.64 mg/dL (ref 0.44–1.00)
GFR calc Af Amer: >60 mL/min (ref >60)
GFR calc non Af Amer: >60 mL/min (ref >60)
Glucose, Bld: 92 mg/dL (ref 65–99)
POTASSIUM: 2.8 mmol/L — AB (ref 3.5–5.1)
SODIUM: 137 mmol/L (ref 135–145)

## 2016-08-30 LAB — GLUCOSE, RANDOM: Glucose, Bld: 227 mg/dL — ABNORMAL HIGH (ref 65–99)

## 2016-08-30 MED ORDER — POTASSIUM CHLORIDE CRYS ER 20 MEQ PO TBCR
40.0000 meq | EXTENDED_RELEASE_TABLET | Freq: Once | ORAL | Status: AC
  Administered 2016-08-30: 40 meq via ORAL
  Filled 2016-08-30: qty 2

## 2016-08-30 MED ORDER — POTASSIUM CHLORIDE CRYS ER 20 MEQ PO TBCR
20.0000 meq | EXTENDED_RELEASE_TABLET | Freq: Every day | ORAL | Status: DC
  Administered 2016-08-31: 20 meq via ORAL
  Filled 2016-08-30: qty 1

## 2016-08-30 MED ORDER — INSULIN GLARGINE 100 UNIT/ML ~~LOC~~ SOLN
19.0000 [IU] | Freq: Every day | SUBCUTANEOUS | Status: DC
  Administered 2016-08-30: 19 [IU] via SUBCUTANEOUS
  Filled 2016-08-30: qty 0.19

## 2016-08-30 MED ORDER — INSULIN ASPART 100 UNIT/ML ~~LOC~~ SOLN: 10.0000 [IU] | Freq: Once | SUBCUTANEOUS | Status: DC

## 2016-08-30 NOTE — Progress Notes (Signed)
Social Work Patient ID: Kimberly Lane, female   DOB: October 16, 1960, 56 y.o.   MRN: [id number]  Spoke with [facility] Social Worker who plans on coming by and taking with pt about going to [facility] from here. [facility]-MD feels she needs to go to [facility] to continue healing and to make sure someone is with her. Team recommends 24 hr supervision level. Pt has been telling rehab staff she wants to go home and can Manage there. Will await Holly's visit tomorrow and see what direction discharge plan will be. Will complete FL2 in preparation of possible NHP on Thursday. Interpreter will be here tomorrow when [place] visits.

## 2016-08-30 NOTE — Progress Notes (Signed)
Occupational Therapy Session Note  Patient Details  Name: Kimberly Lane MRN: [id number] Date of Birth: 1960-11-25  Today's Date: 08/30/2016 OT Individual Time: 0900-1000 OT Individual Time Calculation (min): 60 min    Short Term Goals: Week 2:  OT Short Term Goal 1 (Week 2): Continue working on established supervision level goals for discharge.  Skilled Therapeutic Interventions/Progress Updates: Translator present.   Pt seen for ADL retraining with a focus on reinforcement of skills that she has obtained. Pt used an A/P approach to transfer onto [facility] to toilet and completely bath.  She was able to get a heavier lather of soap as she was not concerned about getting the sheets wet.  Transferred back to bed to donn clothing. She sat to EOB to donn prosthesis but had difficulty getting the last few clicks. Tried several times to have pt scoot back on the bed with her leg propped up on the bed as yesterday she was in this position and she donned prosthesis without difficulty.  Pt was focused on staying in the same position saying the bed was too high for her to push her leg down to have the 3 clicks.  Therapist assisted her with the last 2 clicks.  Used board to transfer to w/c with supervision and set up for brakes and leg rests.  Pt completed grooming at the sink.  Quick release belt donned.  Pt with all needs met.   Therapy Documentation Precautions:  Precautions Precautions: Fall Restrictions Weight Bearing Restrictions: Yes RLE Weight Bearing: Weight bearing as tolerated LLE Weight Bearing: Non weight bearing      Pain: Pain Assessment Pain Assessment: No/denies pain ADL:   See Function Navigator for Current Functional Status.   Therapy/Group: Individual Therapy  [place] 08/30/2016, 11:54 AM

## 2016-08-30 NOTE — Progress Notes (Signed)
[facility] PHYSICAL MEDICINE & REHABILITATION     PROGRESS NOTE  Subjective/Complaints:  Pt seen laying in bed this AM.  She responds "yes" to all questions. No reported issues.  ROS: Limited due to language   Objective: Vital Signs: Blood pressure 140/60, pulse 68, temperature 98.4 F (36.9 C), temperature source Oral, resp. rate 18, height 5\' 2"  (1.575 m), weight 55 kg (121 lb 4.8 oz), SpO2 99 %. No results found.  Recent Labs  08/30/16 0501  WBC 5.9  HGB 9.8*  HCT 32.0*  PLT 406*    Recent Labs  08/30/16 0501  NA 137  K 2.8*  CL 99*  GLUCOSE 92  BUN 12  CREATININE 0.64  CALCIUM 9.0   CBG (last 3)   Recent Labs  08/29/16 1705 08/29/16 2116 08/30/16 0635  GLUCAP 90 300* 89    Wt Readings from Last 3 Encounters:  08/30/16 55 kg (121 lb 4.8 oz)  08/19/16 64.8 kg (142 lb 13.7 oz)  04/20/16 63.5 kg (140 lb)    Physical Exam:  BP 140/60 (BP Location: Left Arm)   Pulse 68   Temp 98.4 F (36.9 C) (Oral)   Resp 18   Ht 5\' 2"  (1.575 m)   Wt 55 kg (121 lb 4.8 oz)   SpO2 99%   BMI 22.19 kg/m  Constitutional: She appears well-developed and well-nourished.  HENT: Normocephalic and atraumatic.  Eyes: EOMI. No discharge.   Cardiovascular: RRR. No JVD   Respiratory: Effort normal and breath sounds normal.  GI: Soft. Bowel sounds are normal.   Musculoskeletal: She exhibits edema and mild tenderness.  Neurological: She is alert.  She is able to follow simple motor commands.    Motor: B/l UE 4+/5 proximal to distal RLE: HF, KE 4+/5 (stable) LLE: HF 4/5 (pain inhibition, unchanged)  Skin: L-AKA with staples in place, C/D/I.  Old R-BKA well healed.   Psychiatric: She is slowed. Cognition and memory are impaired   Assessment/Plan: 1. Functional deficits secondary to left AKA with history of right BKA which require 3+ hours per day of interdisciplinary therapy in a comprehensive inpatient rehab setting. Physiatrist is providing close team supervision and 24  hour management of active medical problems listed below. Physiatrist and rehab team continue to assess barriers to discharge/monitor patient progress toward functional and medical goals.  Function:  Bathing Bathing position   Position: Bed  Bathing parts Body parts bathed by patient: Right arm, Left arm, Left upper leg, Right upper leg, Buttocks, Front perineal area, Abdomen, Chest Body parts bathed by helper: Back  Bathing assist Assist Level: Set up      Upper Body Dressing/Undressing Upper body dressing   What is the patient wearing?: Pull over shirt/dress     Pull over shirt/dress - Perfomed by patient: Thread/unthread right sleeve, Thread/unthread left sleeve, Put head through opening, Pull shirt over trunk          Upper body assist Assist Level: Set up      Lower Body Dressing/Undressing Lower body dressing   What is the patient wearing?: Pants     Pants- Performed by patient: Thread/unthread right pants leg, Thread/unthread left pants leg, Pull pants up/down Pants- Performed by helper: Pull pants up/down                      Lower body assist Assist for lower body dressing: Supervision or verbal cues      Toileting Toileting Toileting activity did not occur: No  continent bowel/bladder event Toileting steps completed by patient: Performs perineal hygiene, Adjust clothing after toileting, Adjust clothing prior to toileting Toileting steps completed by helper: Adjust clothing prior to toileting, Adjust clothing after toileting [place]y: Toilet aid  Toileting assist Assist level: Supervision or verbal cues   Transfers Chair/bed transfer   Chair/bed transfer method: Lateral scoot Chair/bed transfer assist level: Supervision or verbal cues Chair/bed transfer assistive device: Armrests, Sliding board     Locomotion Ambulation Ambulation activity did not occur: Safety/medical concerns (BLE amputee)         Wheelchair   Type:  Manual Max wheelchair distance: 150' Assist Level: Supervision or verbal cues  Cognition Comprehension Comprehension assist level: Understands basic 75 - 89% of the time/ requires cueing 10 - 24% of the time  Expression Expression assist level: Expresses basic needs/ideas: With no assist  Social Interaction Social Interaction assist level: Interacts appropriately 90% of the time - Needs monitoring or encouragement for participation or interaction.  Problem Solving Problem solving assist level: Solves basic 75 - 89% of the time/requires cueing 10 - 24% of the time  Memory Memory assist level: Recognizes or recalls 75 - 89% of the time/requires cueing 10 - 24% of the time    Medical Problem List and Plan: 1.  Limitations in mobility, transfers, cognition, and self-care secondary to left AKA with history of right BKA.   Continue CIR 2.  DVT Prophylaxis/Anticoagulation: Pharmaceutical: Lovenox 3. Pain Management: D/Ced scheduled hydrocodone.   Added tylenol qid with ultram or oxycodone prn  Gabapentin scheduled for phantom limb pain 4. Mood: LCSW to follow for evaluation and support.  5. Neuropsych: This patient is ?capable of making decisions on her own behalf. 6. Skin/Wound Care: monitor wound daily for healing. Change Glucerna (may be contributing to BS elevation)  to prosource for low protein stores.  7. Fluids/Electrolytes/Nutrition: Monitor I/Os 8. T2DM uncontrolled: Hgb A1C- 11.4.  Received decadron by mistake on 6/28 with elevated BS. Monitor BS for with ac/hs CBG checks  Will use SSI for elevated BS and titrate meds as indicated.   Lantus increased to 20 on 7/6, increased to 24u 7/8, decreased to 19U on 7/10  Novolog 6 TID, may need to increase  Remains extremely labile with hypoglycemia 9. HTN: Monitor BP bid  and titrate meds as indicated.  HCTZ DC'd and Chlorthalidone 50 mg started on 7/1, increased to 100 on 7/2   Cont norvasc.   Hydralizine 10 started 7/3, increased to 25 on  7/5  Monitor with increased mobility  Relatively controlled 7/10 10. Acute fluid overload:  Lasix discontinued. Continue hctz with daily weight for monitoring.  11. Nausea/?GERD/poor po intake: On protonix. Will discontinue scheduled hydrocodone as may be contributing to symptoms.  Augment bowel program 12. Leucocytosis: Resolved  Likely reactive and resolving. Monitor for trend and signs of infection.   Cont to monitor  13. Chest pain/heaviness: Has been having for months and takes 'some medications".   14. Anxiety disorder: Multiple somatic complaints--monitor for now. Team to provide ego support.  15. Hypokalemia  K+ 2.8 on 7/10  Supplementing daily, decreased dose on 7/2, increased on 7/10  Labs ordered for tomorrow 16. ABLA  Hb 9.8 on 7/10  Cont to monitor  LOS (Days) 11 A FACE TO FACE EVALUATION WAS PERFORMED  Stanly Si Lorie Phenix 08/30/2016 8:52 AM

## 2016-08-30 NOTE — Progress Notes (Signed)
Occupational Therapy Session Note  Patient Details  Name: Kimberly Lane MRN: [id number] Date of Birth: 1960/03/12  Today's Date: 08/30/2016 OT Individual Time: 1301-1415 OT Individual Time Calculation (min): 74 min    Short Term Goals: Week 2:  OT Short Term Goal 1 (Week 2): Continue working on established supervision level goals for discharge.  Skilled Therapeutic Interventions/Progress Updates:    Pt completed functional transfers to mat, wheelchair, and bed with supervision and mod questioning cueing.  She needed assistance with positioning of the sliding board, but was then able to scoot across the board with supervision.  In the therapy gym had pt work on lateral leans on the mat side to side with supervision to reach for objects as well as scooting up and down the mat to retrieve and place objects.  Supervision to min assist for scooting.  Pt still does not use her RLE prosthesis efficiently for scooting at this time.  Completed wheelchair mobility to the gym with supervision and half way back to the room with supervision.  Pt left in bed with bed alarm in place and call button and phone in reach.  Nursing present to administer medications.   Therapy Documentation Precautions:  Precautions Precautions: Fall Restrictions Weight Bearing Restrictions: Yes RLE Weight Bearing: Weight bearing as tolerated LLE Weight Bearing: Non weight bearing  Pain: Pain Assessment Pain Assessment: No/denies pain ADL: See Function Navigator for Current Functional Status.   Therapy/Group: Individual Therapy  Keyonta Barradas OTR/L 08/30/2016, 4:05 PM

## 2016-08-30 NOTE — Progress Notes (Signed)
Physical Therapy Session Note  Patient Details  Name: Kimberly Lane MRN: [id number] Date of Birth: Oct 27, 1960  Today's Date: 08/30/2016 PT Individual Time: 9833-8250 PT Individual Time Calculation (min): 60 min   Short Term Goals: Week 2:  PT Short Term Goal 1 (Week 2): = LTGs  Skilled Therapeutic Interventions/Progress Updates:    Session focused strongly on d/c planning and education. Practiced simulated car transfer [name] height due to this is the vehicle her husband has and reports they do not have access to a car) requiring mod assist and max encouragement to try due to uneven surface. Educated on importance of attempting this prior to d/c for safety reasons. Pt reports husband would not be able to provide that amount of physical assist and she does not have someone else who could provide this. Discussed family education would need to occur to tomorrow with pending d/c on Thurs and pt reports husband cannot be here due to his work schedule and also would not be providing assist during the day upon d/c and pt would be alone. From a safety stand point (cognition and needed assist for placement of slideboard, set up for transfers, and w/c parts management), pt currently would require 24/7 supervision assistance and is at a high fall risk. Discussed all of this with primary team and CSW.   W/c propulsion and Nustep for functional strengthening and endurance x 7 min on level 4.  Pt completed uneven transfers with slideboard with min assist during session and mod verbal cues for sequencing and correct hand placement for safety.   Medical interpreter present during session.  Therapy Documentation Precautions:  Precautions Precautions: Fall Restrictions Weight Bearing Restrictions: Yes RLE Weight Bearing: Weight bearing as tolerated LLE Weight Bearing: Non weight bearing   Pain:  Does not report pain.    See Function Navigator for Current Functional Status.   Therapy/Group: Individual  Therapy  Canary Brim Ivory Broad, PT, DPT  08/30/2016, 10:56 AM

## 2016-08-30 NOTE — NC FL2 (Signed)
[place] LEVEL OF CARE SCREENING TOOL     IDENTIFICATION  Patient Name: Kimberly Lane Birthdate: Nov 02, 1960 Sex: female Admission Date (Current Location): 08/19/2016  [place] and [place] Number:  Kathleen Argue [id number] [place] and Address:  [facility] [place]. [facility] [place] [address], [place], [place] [address]      Provider Number: [id number]  Attending Physician Name and Address:  Jamse Arn, MD  Relative Name and Phone Number:  Juma-husband [phone]    Current Level of Care: Other (Comment) (rehab) Recommended Level of Care: [place] Prior Approval Number:    Date Approved/Denied:   PASRR Number: [id number]  Discharge Plan: SNF    Current Diagnoses: Patient Active Problem List   Diagnosis Date Noted  . Labile blood pressure   . Labile blood glucose   . Acute blood loss anemia   . Hypokalemia   . Benign essential HTN   . Uncontrolled diabetes mellitus type 2 with peripheral artery disease ([place])   . Acute diastolic congestive heart failure ([place]) 08/19/2016  . Unilateral AKA, left ([place]) 08/19/2016  . Post-operative pain   . Leukocytosis   . Anxiety state   . S/P AKA (above knee amputation), left ([place]) 08/17/2016  . Acute respiratory failure ([place])   . Dyspnea   . Gangrene due to peripheral vascular disease ([place]) 08/09/2016  . PAD (peripheral artery disease) ([place]) 03/11/2016  . Dysuria 10/24/2014  . Osteomyelitis of left foot ([place])   . Cough   . Hx of right BKA ([place]) 05/26/2014  . S/P BKA (below knee amputation) unilateral, right ([place])   . Abnormal EKG   . Preop cardiovascular exam 05/21/2014  . Cellulitis of toe of right foot   . Peripheral arterial disease ([place])   . Cellulitis 05/13/2014  . Type 2 diabetes mellitus with complication ([place])   . HLD (hyperlipidemia)   . Pure hypercholesterolemia 11/14/2013  . High risk social situation 11/13/2013  . Back pain without radiation 09/05/2011    Class: Acute   . Osteoarthritis of left hip 05/31/2011  . History of primary TB 04/26/2011  . DM (diabetes mellitus), type 2, uncontrolled ([place]) 05/17/2010  . Essential hypertension 12/09/2009    Orientation RESPIRATION BLADDER Height & Weight     Self, Time, Situation, Place  Normal Continent Weight: 121 lb 4.8 oz (55 kg) Height:  5\' 2"  (157.5 cm)  BEHAVIORAL SYMPTOMS/MOOD NEUROLOGICAL BOWEL NUTRITION STATUS      Continent Diet (regular diet)  AMBULATORY STATUS COMMUNICATION OF NEEDS Skin   Limited Assist Verbally Surgical wounds                       Personal Care Assistance Level of Assistance  Bathing, Dressing Bathing Assistance: Limited assistance   Dressing Assistance: Limited assistance     Functional Limitations Info  Speech (Doesn't speak English) Sight Info: Adequate Hearing Info: Adequate Speech Info: Adequate    SPECIAL CARE FACTORS FREQUENCY  PT (By licensed PT), OT (By licensed OT)     PT Frequency: 5 x week OT Frequency: 5 x week            Contractures Contractures Info: Not present    Additional Factors Info  Code Status, Allergies, Psychotropic, Insulin Sliding Scale Code Status Info: Full Allergies Info: Bactruim,sulfamethoxzo Psychotropic Info: celexa         Current Medications (08/30/2016):  This is the current hospital active medication list Current Facility-Administered Medications  Medication Dose Route Frequency Provider Last  Rate Last Dose  . albuterol (PROVENTIL) (2.5 MG/3ML) 0.083% nebulizer solution 2.5 mg  2.5 mg Nebulization Q4H PRN Love, Pamela S, PA-C      . alum & mag hydroxide-simeth (MAALOX/MYLANTA) 200-200-20 MG/5ML suspension 30 mL  30 mL Oral Q4H PRN Bary Leriche, PA-C   30 mL at 08/21/16 0823  . amLODipine (NORVASC) tablet 10 mg  10 mg Oral Daily Bary Leriche, PA-C   10 mg at 08/30/16 6073  . aspirin EC tablet 81 mg  81 mg Oral Daily Bary Leriche, PA-C   81 mg at 08/30/16 7106  . atorvastatin (LIPITOR) tablet 40 mg  40  mg Oral q1800 Bary Leriche, PA-C   40 mg at 08/29/16 1716  . bisacodyl (DULCOLAX) suppository 10 mg  10 mg Rectal Daily PRN Love, Pamela S, PA-C      . chlorthalidone (HYGROTON) tablet 100 mg  100 mg Oral Daily Jamse Arn, MD   100 mg at 08/30/16 0820  . citalopram (CELEXA) tablet 30 mg  30 mg Oral Daily Bary Leriche, PA-C   30 mg at 08/30/16 2694  . clopidogrel (PLAVIX) tablet 75 mg  75 mg Oral Daily Bary Leriche, PA-C   75 mg at 08/30/16 8546  . cyclobenzaprine (FLEXERIL) tablet 5 mg  5 mg Oral TID PRN Bary Leriche, PA-C   5 mg at 08/21/16 1617  . diphenhydrAMINE (BENADRYL) 12.5 MG/5ML elixir 12.5-25 mg  12.5-25 mg Oral Q6H PRN Love, Pamela S, PA-C      . enoxaparin (LOVENOX) injection 40 mg  40 mg Subcutaneous Q24H Love, Pamela S, PA-C   40 mg at 08/29/16 1216  . feeding supplement (PRO-STAT SUGAR FREE 64) liquid 30 mL  30 mL Oral BID Bary Leriche, PA-C   30 mL at 08/30/16 2703  . gabapentin (NEURONTIN) capsule 100 mg  100 mg Oral BID Bary Leriche, PA-C   100 mg at 08/30/16 5009  . guaiFENesin-dextromethorphan (ROBITUSSIN DM) 100-10 MG/5ML syrup 5-10 mL  5-10 mL Oral Q6H PRN Bary Leriche, PA-C   5 mL at 08/20/16 1354  . hydrALAZINE (APRESOLINE) tablet 25 mg  25 mg Oral Q8H Jamse Arn, MD   25 mg at 08/30/16 0521  . insulin aspart (novoLOG) injection 0-20 Units  0-20 Units Subcutaneous TID WC Bary Leriche, PA-C   3 Units at 08/29/16 1215  . insulin aspart (novoLOG) injection 6 Units  6 Units Subcutaneous TID WC Bary Leriche, PA-C   6 Units at 08/30/16 0820  . insulin glargine (LANTUS) injection 19 Units  19 Units Subcutaneous QHS Jamse Arn, MD      . methocarbamol (ROBAXIN) tablet 500 mg  500 mg Oral TID Love, Pamela S, PA-C   500 mg at 08/30/16 3818  . multivitamins with iron tablet 1 tablet  1 tablet Oral Q breakfast Bary Leriche, [place]   1 tablet at 08/30/16 2993  . pantoprazole (PROTONIX) EC tablet 40 mg  40 mg Oral Daily Bary Leriche, PA-C   40 mg at  08/30/16 7169  . polyethylene glycol (MIRALAX / GLYCOLAX) packet 17 g  17 g Oral Daily PRN Bary Leriche, PA-C   17 g at 08/24/16 1448  . [START ON 08/31/2016] potassium chloride SA (K-DUR,KLOR-CON) CR tablet 20 mEq  20 mEq Oral Daily Patel, Domenick Bookbinder, MD      . potassium chloride SA (K-DUR,KLOR-CON) CR tablet 40 mEq  40 mEq Oral Once Posey Pronto,  Domenick Bookbinder, MD      . prochlorperazine (COMPAZINE) tablet 5-10 mg  5-10 mg Oral Q6H PRN Love, Pamela S, PA-C       Or  . prochlorperazine (COMPAZINE) injection 5-10 mg  5-10 mg Intramuscular Q6H PRN Love, Pamela S, PA-C       Or  . prochlorperazine (COMPAZINE) suppository 12.5 mg  12.5 mg Rectal Q6H PRN Love, Pamela S, PA-C      . senna-docusate (Senokot-S) tablet 1 tablet  1 tablet Oral QHS Bary Leriche, PA-C   1 tablet at 08/29/16 2114  . sodium phosphate (FLEET) 7-19 GM/118ML enema 1 enema  1 enema Rectal Once PRN Love, Ivan Anchors, PA-C      . traMADol-acetaminophen (ULTRACET) 37.5-325 MG per tablet 1 tablet  1 tablet Oral TID WC & HS Bary Leriche, PA-C   1 tablet at 08/30/16 7902  . traZODone (DESYREL) tablet 25-50 mg  25-50 mg Oral QHS PRN Bary Leriche, PA-C   50 mg at 08/24/16 2137     Discharge Medications: Please see discharge summary for a list of discharge medications.  Relevant Imaging Results:  Relevant Lab Results:   Additional Information SSN: [id number] Speaks [name], no English  Raider Valbuena, Gardiner Rhyme, [place]

## 2016-08-30 NOTE — Significant Event (Signed)
Hypoglycemic Event  CBG: 55  Treatment: 15 GM carbohydrate snack  Symptoms: None  Follow-up CBG: Time:1724 CBG Result:152  Possible Reasons for Event: Unknown  Comments/MD notified:Dinner arrived and pt ate all of her dinner    Kimberly Lane

## 2016-08-31 ENCOUNTER — Inpatient Hospital Stay (HOSPITAL_COMMUNITY): Payer: Medicaid Other | Admitting: Physical Therapy

## 2016-08-31 ENCOUNTER — Encounter (HOSPITAL_COMMUNITY): Payer: Self-pay

## 2016-08-31 ENCOUNTER — Inpatient Hospital Stay (HOSPITAL_COMMUNITY): Payer: No Typology Code available for payment source | Admitting: Occupational Therapy

## 2016-08-31 DIAGNOSIS — M25552 Pain in left hip: Secondary | ICD-10-CM

## 2016-08-31 DIAGNOSIS — G629 Polyneuropathy, unspecified: Secondary | ICD-10-CM

## 2016-08-31 LAB — BASIC METABOLIC PANEL WITH GFR
Anion gap: 9 (ref 5–15)
BUN: 19 mg/dL (ref 6–20)
CO2: 29 mmol/L (ref 22–32)
Calcium: 8.7 mg/dL — ABNORMAL LOW (ref 8.9–10.3)
Chloride: 101 mmol/L (ref 101–111)
Creatinine, Ser: 0.62 mg/dL (ref 0.44–1.00)
GFR calc Af Amer: >60 mL/min (ref >60)
GFR calc non Af Amer: >60 mL/min (ref >60)
Glucose, Bld: 156 mg/dL — ABNORMAL HIGH (ref 65–99)
Potassium: 2.9 mmol/L — ABNORMAL LOW (ref 3.5–5.1)
Sodium: 139 mmol/L (ref 135–145)

## 2016-08-31 LAB — GLUCOSE, CAPILLARY
Glucose-Capillary: 181 mg/dL — ABNORMAL HIGH (ref 65–99)
Glucose-Capillary: 253 mg/dL — ABNORMAL HIGH (ref 65–99)
Glucose-Capillary: 78 mg/dL (ref 65–99)
Glucose-Capillary: 115 mg/dL — ABNORMAL HIGH (ref 65–99)

## 2016-08-31 MED ORDER — POTASSIUM CHLORIDE 10 MEQ/100ML IV SOLN
10.0000 meq | INTRAVENOUS | Status: DC
  Administered 2016-08-31: 10 meq via INTRAVENOUS
  Filled 2016-08-31 (×6): qty 100

## 2016-08-31 MED ORDER — INSULIN ASPART 100 UNIT/ML ~~LOC~~ SOLN: 0.0000 [IU] | Freq: Every day | SUBCUTANEOUS | Status: DC

## 2016-08-31 MED ORDER — POTASSIUM CHLORIDE CRYS ER 20 MEQ PO TBCR
40.0000 meq | EXTENDED_RELEASE_TABLET | Freq: Three times a day (TID) | ORAL | Status: DC
  Administered 2016-08-31 – 2016-09-01 (×2): 40 meq via ORAL
  Filled 2016-08-31 (×3): qty 2

## 2016-08-31 MED ORDER — INSULIN GLARGINE 100 UNIT/ML ~~LOC~~ SOLN
15.0000 [IU] | Freq: Every day | SUBCUTANEOUS | Status: DC
  Administered 2016-08-31: 15 [IU] via SUBCUTANEOUS
  Filled 2016-08-31: qty 0.15

## 2016-08-31 MED ORDER — INSULIN ASPART 100 UNIT/ML ~~LOC~~ SOLN
0.0000 [IU] | Freq: Three times a day (TID) | SUBCUTANEOUS | Status: DC
  Administered 2016-08-31: 5 [IU] via SUBCUTANEOUS
  Administered 2016-09-01: 1 [IU] via SUBCUTANEOUS

## 2016-08-31 MED ORDER — INSULIN ASPART 100 UNIT/ML ~~LOC~~ SOLN
3.0000 [IU] | Freq: Three times a day (TID) | SUBCUTANEOUS | Status: DC
  Administered 2016-08-31 – 2016-09-01 (×2): 3 [IU] via SUBCUTANEOUS

## 2016-08-31 MED ORDER — POTASSIUM CHLORIDE CRYS ER 20 MEQ PO TBCR
40.0000 meq | EXTENDED_RELEASE_TABLET | Freq: Once | ORAL | Status: AC
  Administered 2016-08-31: 40 meq via ORAL
  Filled 2016-08-31: qty 2

## 2016-08-31 MED ORDER — GABAPENTIN 100 MG PO CAPS
100.0000 mg | ORAL_CAPSULE | Freq: Three times a day (TID) | ORAL | Status: DC
Start: 2016-08-31 — End: 2016-09-01
  Administered 2016-08-31 – 2016-09-01 (×3): 100 mg via ORAL
  Filled 2016-08-31 (×3): qty 1

## 2016-08-31 MED ORDER — METFORMIN HCL 500 MG PO TABS
500.0000 mg | ORAL_TABLET | Freq: Two times a day (BID) | ORAL | Status: DC
  Administered 2016-08-31 – 2016-09-01 (×2): 500 mg via ORAL
  Filled 2016-08-31 (×2): qty 1

## 2016-08-31 NOTE — Progress Notes (Signed)
Patient unable to tolerate runs of K+. Will change to oral supplement and recheck labs in am.

## 2016-08-31 NOTE — Progress Notes (Signed)
Patient CBG 528 at 2057; on-call provider, Reesa Chew, contacted. Order for STAT lab verification per protocol. Lab Glucose: 227; recommendation by on-call provider to only administer scheduled Lantus. Will continue to monitor.  Annita Brod, RN

## 2016-08-31 NOTE — Progress Notes (Signed)
Social Work Patient ID: Kimberly Lane, female   DOB: 1960-02-24, 56 y.o.   MRN: [id number]  Met with pt, husband and [facility] Social Worker to discuss discharge plan. Pt has agreed to go to [facility] Until she is able to stay alone at home. Both husband and pt feel this will be the best plan for her. Have spoken with Rhonda-admission coordinator at [facility] she has a bed and can take her tomorrow. Pam-PA aware of need for DC summary and team aware, will work toward transfer tomorrow to [facility].

## 2016-08-31 NOTE — Progress Notes (Signed)
Physical Therapy Discharge Summary  Patient Details  Name: Kimberly Lane MRN: [id number] Date of Birth: 03/28/1960  Today's Date: 08/31/2016   Patient has met 6 of 7 long term goals due to improved activity tolerance, improved balance, improved postural control, increased strength and decreased pain.  Patient to discharge at a wheelchair level Supervision.   Patient's care partner requires assistance and unavailable to provide the necessary physical assistance at discharge. Pt to be discharged to SNF due to decreased caregiver support and unable to provide 24/7 supervision at current time.   Reasons goals not met: Pt unable to complete car transfer  Recommendation:  Patient will benefit from ongoing skilled PT services in skilled nursing facility setting to continue to advance safe functional mobility, address ongoing impairments in functional mobility, safety, endurance, strength, and minimize fall risk.  Equipment: No equipment provided  Reasons for discharge: Pt to continue therapy services in SNF  Patient/family agrees with progress made and goals achieved: Yes  PT Discharge Precautions/Restrictions Precautions Precautions: Fall Restrictions Weight Bearing Restrictions: Yes RLE Weight Bearing: Weight bearing as tolerated LLE Weight Bearing: Non weight bearing   Vision/Perception  Vision - Assessment Eye Alignment: Within Functional Limits Ocular Range of Motion: Within Functional Limits Tracking/Visual Pursuits: Able to track stimulus in all quads without difficulty Perception Perception: Within Functional Limits Praxis Praxis: Intact  Cognition Overall Cognitive Status: Impaired/Different from baseline Arousal/Alertness: Awake/alert Orientation Level: Oriented X4 Attention: Selective Selective Attention: Appears intact Memory: Appears intact Awareness: Impaired Awareness Impairment: Anticipatory impairment Problem Solving: Impaired Safety/Judgment:  Impaired Sensation Sensation Light Touch: Appears Intact Stereognosis: Appears Intact Hot/Cold: Appears Intact Proprioception: Appears Intact Additional Comments: Sensation intact in BUEs Coordination Gross Motor Movements are Fluid and Coordinated: Yes Fine Motor Movements are Fluid and Coordinated: Yes Motor  Motor Motor: Within Functional Limits Motor - Discharge Observations: generalized weakness   Trunk/Postural Assessment  Cervical Assessment Cervical Assessment: Within Functional Limits Thoracic Assessment Thoracic Assessment: Exceptions to [facility] (slight thoracic rounding) Lumbar Assessment Lumbar Assessment: Exceptions to [facility] (posterior pelvic tilt) Postural Control Postural Control: Within Functional Limits  Balance Balance Balance Assessed: Yes Static Sitting Balance Static Sitting - Balance Support: No upper extremity supported Static Sitting - Level of Assistance: 6: Modified independent (Device/Increase time) Dynamic Sitting Balance Dynamic Sitting - Balance Support: During functional activity Dynamic Sitting - Level of Assistance: 5: Stand by assistance Extremity Assessment  RUE Assessment RUE Assessment: Exceptions to [facility] (Pt with shoulder strength 3/5 with all other joints 3+/5) LUE Assessment LUE Assessment: Exceptions to [facility] (Pt with shoulder strength 3/5 with all other joints 3+/5)       See Function Navigator for Current Functional Status.  Rosita DeChalus 08/31/2016, 9:44 PM   Lars Masson, PT, DPT 09/01/16 8:44 AM

## 2016-08-31 NOTE — Progress Notes (Signed)
Occupational Therapy Session Note  Patient Details  Name: Kimberly Lane MRN: [id number] Date of Birth: 03/11/1960  Today's Date: 08/31/2016 OT Individual Time: 1000-1105 OT Individual Time Calculation (min): 65 min    Short Term Goals: Week 1:  OT Short Term Goal 1 (Week 1): Pt will transfer to DAC/3 in 1 with MOD A OT Short Term Goal 1 - Progress (Week 1): Met OT Short Term Goal 2 (Week 1): Pt will complete 2/3 steps of toileting on BSC with touching A OT Short Term Goal 2 - Progress (Week 1): Met OT Short Term Goal 3 (Week 1): Pt will don pants with touching A using lateral leans  OT Short Term Goal 3 - Progress (Week 1): Met OT Short Term Goal 4 (Week 1): Pt will complete 5 min of functional task without rest break to improve endurance required for BADLs OT Short Term Goal 4 - Progress (Week 1): Met Week 2:  OT Short Term Goal 1 (Week 2): Continue working on established supervision level goals for discharge.     Skilled Therapeutic Interventions/Progress Updates:    Pt seen for family education with her spouse and the assistance of a translator during ADL training and transfer skills.  Pt was moving more slowly today as she was in pain and stated she felt tired.  She was able to complete A/P transfers on and off BSC, bathing and toileting on BSC, dressing, donning R prosthesis, and slide board transfers to W/c all with close supervision/ set up. Spouse observed the therapy session and did ask questions about what type of equipment he would receive.  Referred the questions to the [facility] SW who arrived at the end of the session.   Pt resting in w/c with quick release belt on and all needs met.  Therapy Documentation Precautions:  Precautions Precautions: Fall Restrictions Weight Bearing Restrictions: Yes RLE Weight Bearing: Weight bearing as tolerated LLE Weight Bearing: Non weight bearing    Vital Signs: Therapy Vitals BP: (!) 151/66 Pain: Pain Assessment Pain Assessment:  Faces Pain Score: 0-No pain Faces Pain Scale: Hurts even more Pain Type: Surgical pain Pain Location: Leg Pain Descriptors / Indicators: Aching Pain Onset: On-going Pain Intervention(s): RN made aware ADL:   See Function Navigator for Current Functional Status.   Therapy/Group: Individual Therapy  SAGUIER,JULIA 08/31/2016, 11:45 AM

## 2016-08-31 NOTE — Progress Notes (Signed)
Occupational Therapy Discharge Summary  Patient Details  Name: Kimberly Lane MRN: [id number] Date of Birth: 1961-01-21  Today's Date: 08/31/2016 OT Individual Time: 0626-9485 OT Individual Time Calculation (min): 43 min    Session Note:  Pt donned prosthesis EOB with supervision and completed sliding board transfer with supervision from bed to wheelchair.  She was able to propel her wheelchair down to the therapy gym with supervision as well.  Had her complete 2 intervals of 3 and 4 mins on the UE ergonometer with resistance set on level 5.  Emphasis on peddling posteriorly to work posterior shoulder and help increase posture.  Pt needing max instructional cueing to continue exercise and to re-grip her hands.  Gloves also worn for support during wheelchair mobility and use of the ergonometer.  Finished session with transfer to the therapy mat with supervision using the sliding board.  Pt still needing mod demonstrational cueing for wheelchair setup and correct use of the sliding board.  Interpreter present throughout session.    Patient has met 8 of 8 long term goals due to improved activity tolerance, improved balance and ability to compensate for deficits.  Patient to discharge at overall Supervision level.  Patient's care partner unavailable to provide the necessary physical and cognitive assistance at discharge.    Reasons goals not met: NA  Recommendation:  Patient will benefit from ongoing skilled OT services in skilled nursing facility setting to continue to advance functional skills in the area of BADL.  Pt still needs cueing and setup for all sliding board transfers.  She demonstrates decreased awareness of her need to have supervision for these tasks as well as most self care.  Her husband will be working and cannot provide 24 hour supervision secondary to their financial situation.  Feel pt will benefit from continued OT at SNF level in order to reach modified independent level.     Equipment: No equipment provided  Reasons for discharge: treatment goals met and discharge from hospital  Patient/family agrees with progress made and goals achieved: Yes  OT Discharge Precautions/Restrictions  Precautions Precautions: Fall Restrictions Weight Bearing Restrictions: Yes RLE Weight Bearing: Weight bearing as tolerated LLE Weight Bearing: Non weight bearing  Vital Signs Therapy Vitals Temp: 98.6 F (37 C) Temp Source: Oral Pulse Rate: 74 Resp: 18 BP: (!) 140/56 Patient Position (if appropriate): Lying Oxygen Therapy SpO2: 98 % O2 Device: Not Delivered Pain Pain Assessment Pain Assessment: No/denies pain Pain Score: 0-No pain ADL  See Function Section of chart for details  Vision Patient Visual Report: No change from baseline Eye Alignment: Within Functional Limits Ocular Range of Motion: Within Functional Limits Tracking/Visual Pursuits: Able to track stimulus in all quads without difficulty Perception  Perception: Within Functional Limits Praxis Praxis: Intact Cognition Overall Cognitive Status: Impaired/Different from baseline Arousal/Alertness: Awake/alert Orientation Level: Oriented X4 Attention: Selective Selective Attention: Appears intact Memory: Appears intact Awareness: Impaired Awareness Impairment: Anticipatory impairment Problem Solving: Impaired Safety/Judgment: Impaired Sensation Sensation Light Touch: Appears Intact Stereognosis: Appears Intact Hot/Cold: Appears Intact Proprioception: Appears Intact Additional Comments: Sensation intact in BUEs Coordination Gross Motor Movements are Fluid and Coordinated: Yes Fine Motor Movements are Fluid and Coordinated: Yes Motor  Motor Motor: Within Functional Limits Motor - Discharge Observations: generalized weakness Mobility    See Function Section of chart for details  Trunk/Postural Assessment  Cervical Assessment Cervical Assessment: Within Functional Limits Thoracic  Assessment Thoracic Assessment: Exceptions to [facility] (slight thoracic rounding) Lumbar Assessment Lumbar Assessment: Exceptions to [facility] (posterior pelvic tilt) Postural  Control Postural Control: Within Functional Limits  Balance Balance Balance Assessed: Yes Static Sitting Balance Static Sitting - Balance Support: No upper extremity supported Static Sitting - Level of Assistance: 6: Modified independent (Device/Increase time) Dynamic Sitting Balance Dynamic Sitting - Balance Support: During functional activity Dynamic Sitting - Level of Assistance: 5: Stand by assistance Extremity/Trunk Assessment RUE Assessment RUE Assessment: Exceptions to [facility] (Pt with shoulder strength 3/5 with all other joints 3+/5) LUE Assessment LUE Assessment: Exceptions to [facility] (Pt with shoulder strength 3/5 with all other joints 3+/5)   See Function Navigator for Current Functional Status.  Jearlene Bridwell OTR/L 08/31/2016, 5:55 PM

## 2016-08-31 NOTE — Discharge Summary (Addendum)
Physician Discharge Summary  Patient ID: Kimberly Lane MRN: [id number] DOB/AGE: [birth year] 56 y.o.  Admit date: 08/19/2016 Discharge date: 09/01/2016  Discharge Diagnoses:  Principal Problem:   Unilateral AKA, left (HCC) Active Problems:   Acute blood loss anemia   Hypokalemia   Benign essential HTN   Uncontrolled diabetes mellitus type 2 with peripheral artery disease (HCC)   Labile blood glucose   Labile blood pressure   Neuropathy LLE   Left hip pain   Discharged Condition:  Improved.   Significant Diagnostic Studies: N/A   Labs:  Basic Metabolic Panel:  Recent Labs Lab 08/30/16 0501 08/30/16 2152 08/31/16 0614 09/01/16 0532  NA 137  --  139 140  K 2.8*  --  2.9* 3.3*  CL 99*  --  101 104  CO2 30  --  29 27  GLUCOSE 92 227* 156* 151*  BUN 12  --  19 17  CREATININE 0.64  --  0.62 0.65  CALCIUM 9.0  --  8.7* 8.6*    CBC: CBC Latest Ref Rng & Units 08/30/2016 08/23/2016 08/22/2016  WBC 4.0 - 10.5 K/uL 5.9 7.0 7.4  Hemoglobin 12.0 - 15.0 g/dL 9.8(L) 10.1(L) 9.9(L)  Hematocrit 36.0 - 46.0 % 32.0(L) 32.8(L) 32.2(L)  Platelets 150 - 400 K/uL 406(H) 490(H) 497(H)    CBG:  Recent Labs Lab 08/31/16 0707 08/31/16 1201 08/31/16 1652 08/31/16 2113 09/01/16 0648  GLUCAP 181* 253* 78 115* 136*    Today's Vitals   09/01/16 0500 09/01/16 0621 09/01/16 0848 09/01/16 0950  BP: (!) 149/59 (!) 149/59 (!) 147/61   Pulse: 73     Resp: 16     Temp: 98.9 F (37.2 C)     TempSrc: Oral     SpO2: 97%     Weight: 57.1 kg (125 lb 14.1 oz)     Height:      PainSc:    0-No pain    Brief HPI:   Kimberly Lane is a 56 year old [name] woman with uncontrolled T2DM, HTN, PAD s/p R-BKA, PVD  And L-SFA stent and progressive necrotic changes of right great toe with osteomyelitis. Wound cultures positive for few citrobacter freundii and she was treated with ceftriaxone and metronidazole. Hospital course complicate by volume overload 6/23 requiring intubation and aggressive diuresis. 2 D  echo with EF 60-65%, moderate LVH with grade 1 diastolic dysfunction Surgery delayed till stable and she underwent L-AKA on 6/25. Her blood sugars have been poorly controlled and lantus recommended by diabetes coordinater for better control. Po intake continued to be poor and she continued to have deficits in mobility and ability to carry out ADL tasks. Therapy ongoing and CIR recommended for follow up therapy.      Hospital Course: Kimberly Lane was admitted to rehab 08/19/2016 for inpatient therapies to consist of PT and OT at least three hours five days a week. Past admission physiatrist, therapy team and rehab RN have worked together to provide customized collaborative inpatient rehab.  Blood pressures were monitored on bid basis and noted to be poorly controlled. Hydralazine was titrated upwards and chlorthalidone was added for better control. Follow up labs shows renal status is stable but she was found to be hypokalemic. This has been supplemented aggressively and will need to be followed closely for dose adjustment as hypokalemia resolves.  She reported poor pain control due to ongoing issues with neuropathy left thigh as well as left hip/buttock pain. She is not able to advocated for her self therefore medications were adjusted  to help with pain management.  Gabapentin was added and slowly titrated upwards. Robaxin was scheduled to help with hip tightness and question of spasms. Current regimen has improved her symptoms but will likely need to be titrated upwards. Po intake has improved and extensive diabetic education was done during her stay.  Blood sugars are improving with hypoglycemic episodes therefore insulin has been adjusted to prevent drop in BS.  Left AKA incision is healing well, staples are intact and no signs or symptoms of infection.  Her activity tolerance and mood is improving with increase in Celexa dose and ego support by team. She has been making progress but continues to require   Supervision at this time. Family is unable to provide assistance needed and SNF was recommended for progressive therapy. She was discharged to [facility] on 09/01/16 in improved condition.    Rehab course: During patient's stay in rehab weekly team conferences were held to monitor patient's progress, set goals and discuss barriers to discharge. At admission, patient required mod to max assist with basic self care tasks and mod assist with mobility. She has had improvement in activity tolerance, balance, postural control, as well as ability to compensate for deficits. She is able to complete ADL tasks with supervision.  She is modified independent for dynamic sitting balance and is able to perform SB transfers with total assist for set up and supervision for transfers.  She is able to propel her wheelchair for 150' with cues to avoid obstacles.    Disposition: [place].   Diet: Diabetic diet.   Special Instructions: 1. Check BS ac/hs and use sensitive SSI protocol. 2. Check BMET in  7/13 and adjust K dur dose as indicated.  3. Patient will need 30" transfer board and drop arm commode at discharge from SNF.   Discharge Instructions    Ambulatory referral to Physical Medicine Rehab    Complete by:  As directed    4 weeks follow up appointment     Allergies as of 09/01/2016      Reactions   Bactrim [sulfamethoxazole-trimethoprim] Nausea And Vomiting, Other (See Comments)   Pt and family verified that pt takes this medication   Tuberculin Ppd Other (See Comments)   Unknown   Ace Inhibitors Cough   cough      Medication List    STOP taking these medications   glipiZIDE 5 MG 24 hr tablet Commonly known as:  GLUCOTROL XL   guaiFENesin-dextromethorphan 100-10 MG/5ML syrup Commonly known as:  ROBITUSSIN DM   hydrochlorothiazide 25 MG tablet Commonly known as:  HYDRODIURIL   HYDROcodone-acetaminophen 5-325 MG tablet Commonly known as:  NORCO/VICODIN   metFORMIN  500 MG 24 hr tablet Commonly known as:  GLUCOPHAGE-XR Replaced by:  metFORMIN 500 MG tablet   oxyCODONE 5 MG immediate release tablet Commonly known as:  Oxy IR/ROXICODONE     TAKE these medications   acetaminophen 650 MG CR tablet Commonly known as:  TYLENOL Take 650 mg by mouth every 8 (eight) hours as needed for pain.   amLODipine-atorvastatin 10-40 MG tablet Commonly known as:  CADUET Take 1 tablet by mouth daily.   aspirin 81 MG tablet Take 1 tablet (81 mg total) by mouth daily.   chlorthalidone 100 MG tablet Commonly known as:  HYGROTEN Take 1 tablet (100 mg total) by mouth daily.   citalopram 20 MG tablet Commonly known as:  CELEXA Take 1.5 tablets (30 mg total) by mouth daily. What changed:  how much to  take   clopidogrel 75 MG tablet Commonly known as:  PLAVIX Take 1 tablet (75 mg total) by mouth daily with breakfast.   cyclobenzaprine 5 MG tablet Commonly known as:  FLEXERIL Take 1 tablet (5 mg total) by mouth 3 (three) times daily as needed for muscle spasms.   feeding supplement (PRO-STAT SUGAR FREE 64) Liqd Take 30 mLs by mouth 2 (two) times daily.   gabapentin 100 MG capsule Commonly known as:  NEURONTIN Take 1 capsule (100 mg total) by mouth 3 (three) times daily.   hydrALAZINE 25 MG tablet Commonly known as:  APRESOLINE Take 1 tablet (25 mg total) by mouth every 8 (eight) hours.   insulin aspart 100 UNIT/ML injection Commonly known as:  novoLOG Inject 0-20 Units into the skin 3 (three) times daily with meals. What changed:  Another medication with the same name was added. Make sure you understand how and when to take each.   insulin aspart 100 UNIT/ML injection Commonly known as:  novoLOG Inject 0-9 Units into the skin 3 (three) times daily with meals. What changed:  You were already taking a medication with the same name, and this prescription was added. Make sure you understand how and when to take each.   insulin glargine 100 UNIT/ML  injection Commonly known as:  LANTUS Inject 0.15 mLs (15 Units total) into the skin at bedtime. What changed:  how much to take   metFORMIN 500 MG tablet Commonly known as:  GLUCOPHAGE Take 1 tablet (500 mg total) by mouth 2 (two) times daily with a meal. Replaces:  metFORMIN 500 MG 24 hr tablet   methocarbamol 500 MG tablet Commonly known as:  ROBAXIN Take 1 tablet (500 mg total) by mouth 3 (three) times daily.   multivitamin with minerals Tabs tablet Take 1 tablet by mouth daily.   pantoprazole 40 MG tablet Commonly known as:  PROTONIX Take 1 tablet (40 mg total) by mouth daily.   polyethylene glycol packet Commonly known as:  MIRALAX / GLYCOLAX Take 17 g by mouth daily as needed for mild constipation.   potassium chloride SA 20 MEQ tablet Commonly known as:  K-DUR,KLOR-CON Take 2 tablets (40 mEq total) by mouth 3 (three) times daily before meals.   senna-docusate 8.6-50 MG tablet Commonly known as:  Senokot-S Take 1 tablet by mouth at bedtime.   traMADol-acetaminophen 37.5-325 MG tablet--Rx # 30 pills Commonly known as:  ULTRACET Take 1 tablet by mouth 4 (four) times daily -  with meals and at bedtime.       Contact information for follow-up providers    Waynetta Sandy, MD Follow up on 09/02/2016.   Specialties:  Vascular Surgery, Cardiology Why:  Appointment @ 11:45 am Contact information: [place] [id number] [phone]            Contact information for after-discharge care    [facility] .   Specialty:  [place] information: [address] [place] [phone]                  Signed: Bary Leriche 09/01/2016, 11:07 AM

## 2016-08-31 NOTE — Progress Notes (Signed)
Physical Therapy Session Note  Patient Details  Name: Kimberly Lane MRN: [id number] Date of Birth: 1960/07/03  Today's Date: 08/31/2016 PT Individual Time: 1100-1200 and 1345-1415 PT Individual Time Calculation (min): 60 min and 30 min  Short Term Goals: Week 2:  PT Short Term Goal 1 (Week 2): = LTGs  Skilled Therapeutic Interventions/Progress Updates: Tx1:  Pt presented in w/c agreeable to therapy. Propelled to rehab gym with x 2 brief rest breaks. Performed SB transfer to mat with total assit  For SB set up but supervision for transfer. Pt refused car transfer as will be d/c to SNF but agreeable to standing trials with RW. Performed x 2 sit to stand with RW maxA PTA assisted with prosthetic placement and cues wt shift. Pt with increased fatigue on second attempt with pt barely able to clear mat. Performed bed mobilty on flat mat sit to/from supine and rolling L/R with supervision. Pt returned to w/c in same manner as prior and propelled back to room. Pt returned to bed via SB transfer with supervision and PTA set up and pt left sitting at EOB to eat lunch with call bell within reach.   Tx2: Pt presented sitting on mat in gym hand off from OT. Performed SB transfer to w/c supervision with total assist SB set up. Pt participated in w/c propulsion activities including weaving through cones and ascendin/descending ramp. Pt required min cues for cone negotiation and mod cues fade to min cues for descending w/c down ramp on second attempt. Pt propelled throughout unit requiring occasional breaks due to fatigue. Pt returned to room and performed SB transfer to bed in same manner as prior. Pt left with call bell within reach and needs met.      Therapy Documentation Precautions:  Precautions Precautions: Fall Restrictions Weight Bearing Restrictions: Yes RLE Weight Bearing: Weight bearing as tolerated LLE Weight Bearing: Non weight bearing    Trunk/Postural Assessment : Cervical  Assessment Cervical Assessment: Within Functional Limits Thoracic Assessment Thoracic Assessment: Exceptions to [facility] (slight thoracic rounding) Lumbar Assessment Lumbar Assessment: Exceptions to [facility] (posterior pelvic tilt) Postural Control Postural Control: Within Functional Limits  Balance: Balance Balance Assessed: Yes Static Sitting Balance Static Sitting - Balance Support: No upper extremity supported Static Sitting - Level of Assistance: 6: Modified independent (Device/Increase time) Dynamic Sitting Balance Dynamic Sitting - Balance Support: During functional activity Dynamic Sitting - Level of Assistance: 5: Stand by assistance   See Function Navigator for Current Functional Status.   Therapy/Group: Individual Therapy  Boneta Standre  Caydon Feasel, PTA  08/31/2016, 9:37 PM

## 2016-08-31 NOTE — Progress Notes (Signed)
PHYSICAL MEDICINE & REHABILITATION     PROGRESS NOTE  Subjective/Complaints:  Pt seen laying in bed this AM.  Husband at bedside.  No reported issues overnight.   ROS: Limited due to language   Objective: Vital Signs: Blood pressure (!) 155/64, pulse 64, temperature 98.6 F (37 C), temperature source Oral, resp. rate 18, height 5\' 2"  (1.575 m), weight 54.6 kg (120 lb 5.9 oz), SpO2 96 %. No results found.  Recent Labs  08/30/16 0501  WBC 5.9  HGB 9.8*  HCT 32.0*  PLT 406*    Recent Labs  08/30/16 0501 08/30/16 2152 08/31/16 0614  NA 137  --  139  K 2.8*  --  2.9*  CL 99*  --  101  GLUCOSE 92 227* 156*  BUN 12  --  19  CREATININE 0.64  --  0.62  CALCIUM 9.0  --  8.7*   CBG (last 3)   Recent Labs  08/30/16 2057 08/30/16 2239 08/31/16 0707  GLUCAP 528* 242* 181*    Wt Readings from Last 3 Encounters:  08/31/16 54.6 kg (120 lb 5.9 oz)  08/19/16 64.8 kg (142 lb 13.7 oz)  04/20/16 63.5 kg (140 lb)    Physical Exam:  BP (!) 155/64 (BP Location: Right Arm)   Pulse 64   Temp 98.6 F (37 C) (Oral)   Resp 18   Ht 5\' 2"  (1.575 m)   Wt 54.6 kg (120 lb 5.9 oz)   SpO2 96%   BMI 22.02 kg/m  Constitutional: She appears well-developed and well-nourished.  HENT: Normocephalic and atraumatic.  Eyes: EOMI. No discharge.   Cardiovascular: RRR. No JVD   Respiratory: Effort normal and breath sounds normal.  GI: Soft. Bowel sounds are normal.   Musculoskeletal: She exhibits edema and mild tenderness.  Neurological: She is alert.  She is able to follow simple motor commands.    Motor: B/l UE 4+/5 proximal to distal RLE: HF, KE 4+/5 (stable) LLE: HF 4+/5 (pain inhibition)  Skin: L-AKA with staples in place, C/D/I.  Old R-BKA well healed.   Psychiatric: She is slowed, ? 2/2 language.    Assessment/Plan: 1. Functional deficits secondary to left AKA with history of right BKA which require 3+ hours per day of interdisciplinary therapy in a comprehensive  inpatient rehab setting. Physiatrist is providing close team supervision and 24 hour management of active medical problems listed below. Physiatrist and rehab team continue to assess barriers to discharge/monitor patient progress toward functional and medical goals.  Function:  Bathing Bathing position   Position: Other (comment) (sitting on BSC )  Bathing parts Body parts bathed by patient: Right arm, Left arm, Left upper leg, Right upper leg, Buttocks, Front perineal area, Abdomen, Chest Body parts bathed by helper: Back  Bathing assist Assist Level: Set up      Upper Body Dressing/Undressing Upper body dressing   What is the patient wearing?: Pull over shirt/dress     Pull over shirt/dress - Perfomed by patient: Thread/unthread right sleeve, Thread/unthread left sleeve, Put head through opening, Pull shirt over trunk          Upper body assist Assist Level: Set up      Lower Body Dressing/Undressing Lower body dressing   What is the patient wearing?: Pants     Pants- Performed by patient: Thread/unthread right pants leg, Thread/unthread left pants leg, Pull pants up/down Pants- Performed by helper: Pull pants up/down  Lower body assist Assist for lower body dressing: Supervision or verbal cues      Toileting Toileting Toileting activity did not occur: No continent bowel/bladder event Toileting steps completed by patient: Performs perineal hygiene, Adjust clothing after toileting, Adjust clothing prior to toileting Toileting steps completed by helper: Adjust clothing prior to toileting, Adjust clothing after toileting [place]: Toilet aid  Toileting assist Assist level: Supervision or verbal cues   Transfers Chair/bed transfer   Chair/bed transfer method: Lateral scoot Chair/bed transfer assist level: Supervision or verbal cues Chair/bed transfer assistive device: Armrests, Sliding board     Locomotion Ambulation  Ambulation activity did not occur: Safety/medical concerns (BLE amputee)         Wheelchair   Type: Manual Max wheelchair distance: 150' Assist Level: Supervision or verbal cues  Cognition Comprehension Comprehension assist level: Understands basic 75 - 89% of the time/ requires cueing 10 - 24% of the time  Expression Expression assist level: Expresses basic 90% of the time/requires cueing < 10% of the time.  Social Interaction Social Interaction assist level: Interacts appropriately 75 - 89% of the time - Needs redirection for appropriate language or to initiate interaction.  Problem Solving Problem solving assist level: Solves basic 50 - 74% of the time/requires cueing 25 - 49% of the time  Memory Memory assist level: Recognizes or recalls 75 - 89% of the time/requires cueing 10 - 24% of the time    Medical Problem List and Plan: 1.  Limitations in mobility, transfers, cognition, and self-care secondary to left AKA with history of right BKA.   Continue CIR 2.  DVT Prophylaxis/Anticoagulation: Pharmaceutical: Lovenox 3. Pain Management: D/Ced scheduled hydrocodone.   Added tylenol qid with ultram or oxycodone prn  Gabapentin scheduled for phantom limb pain  Robaxin added 4. Mood: LCSW to follow for evaluation and support.  5. Neuropsych: This patient is ?capable of making decisions on her own behalf. 6. Skin/Wound Care: monitor wound daily for healing. Change Glucerna (may be contributing to BS elevation)  to prosource for low protein stores.  7. Fluids/Electrolytes/Nutrition: Monitor I/Os 8. T2DM uncontrolled: Hgb A1C- 11.4.  Received decadron by mistake on 6/28 with elevated BS. Monitor BS for with ac/hs CBG checks  Will use SSI for elevated BS and titrate meds as indicated.   Lantus increased to 20 on 7/6, increased to 24u 7/8, decreased to 19U on 7/10, decreased to 15 on 7/11  Novolog 6 TID, may need to increase  Remains extremely labile with hypoglycemia and elevations >500 9.  HTN: Monitor BP bid  and titrate meds as indicated.  HCTZ DC'd and Chlorthalidone 50 mg started on 7/1, increased to 100 on 7/2   Cont norvasc.   Hydralizine 10 started 7/3, increased to 25 on 7/5  Monitor with increased mobility  Labile, but overall controlled 7/11 10. Acute fluid overload:  Lasix discontinued. Continue hctz with daily weight for monitoring.  11. Nausea/?GERD/poor po intake: On protonix. Will discontinue scheduled hydrocodone as may be contributing to symptoms.  Augment bowel program 12. Leucocytosis: Resolved  Likely reactive and resolving. Monitor for trend and signs of infection.   Cont to monitor  13. Chest pain/heaviness: Has been having for months and takes 'some medications".   14. Anxiety disorder: Multiple somatic complaints--monitor for now. Team to provide ego support.  15. Hypokalemia  K+ 2.9 on 7/11  Supplementing daily, decreased dose on 7/2, increased on 7/10  Will give IV dose today 16. ABLA  Hb 9.8 on 7/10  Cont to monitor  LOS (Days) 12 A FACE TO FACE EVALUATION WAS PERFORMED  Kimberly Lane Kimberly Lane 08/31/2016 8:46 AM

## 2016-09-01 DIAGNOSIS — G629 Polyneuropathy, unspecified: Secondary | ICD-10-CM

## 2016-09-01 LAB — BASIC METABOLIC PANEL
ANION GAP: 9 (ref 5–15)
BUN: 17 mg/dL (ref 6–20)
CHLORIDE: 104 mmol/L (ref 101–111)
CO2: 27 mmol/L (ref 22–32)
Calcium: 8.6 mg/dL — ABNORMAL LOW (ref 8.9–10.3)
Creatinine, Ser: 0.65 mg/dL (ref 0.44–1.00)
GFR calc Af Amer: >60 mL/min (ref >60)
GLUCOSE: 151 mg/dL — AB (ref 65–99)
POTASSIUM: 3.3 mmol/L — AB (ref 3.5–5.1)
Sodium: 140 mmol/L (ref 135–145)

## 2016-09-01 LAB — GLUCOSE, CAPILLARY: Glucose-Capillary: 136 mg/dL — ABNORMAL HIGH (ref 65–99)

## 2016-09-01 MED ORDER — INSULIN ASPART 100 UNIT/ML ~~LOC~~ SOLN: 0.0000 [IU] | Freq: Three times a day (TID) | SUBCUTANEOUS | 11 refills | Status: DC

## 2016-09-01 MED ORDER — CITALOPRAM HYDROBROMIDE 20 MG PO TABS: 30.0000 mg | ORAL_TABLET | Freq: Every day | ORAL | Status: DC

## 2016-09-01 MED ORDER — POLYETHYLENE GLYCOL 3350 17 G PO PACK: 17.0000 g | PACK | Freq: Every day | ORAL | 0 refills | Status: DC | PRN

## 2016-09-01 MED ORDER — POTASSIUM CHLORIDE CRYS ER 20 MEQ PO TBCR: 40.0000 meq | EXTENDED_RELEASE_TABLET | Freq: Three times a day (TID) | ORAL | Status: DC

## 2016-09-01 MED ORDER — GABAPENTIN 100 MG PO CAPS: 100.0000 mg | ORAL_CAPSULE | Freq: Three times a day (TID) | ORAL | Status: DC

## 2016-09-01 MED ORDER — TRAMADOL-ACETAMINOPHEN 37.5-325 MG PO TABS: 1.0000 | ORAL_TABLET | Freq: Three times a day (TID) | ORAL | 0 refills | Status: DC

## 2016-09-01 MED ORDER — CHLORTHALIDONE 100 MG PO TABS: 100.0000 mg | ORAL_TABLET | Freq: Every day | ORAL | Status: DC

## 2016-09-01 MED ORDER — PRO-STAT SUGAR FREE PO LIQD: 30.0000 mL | Freq: Two times a day (BID) | ORAL | 0 refills | Status: DC

## 2016-09-01 MED ORDER — INSULIN GLARGINE 100 UNIT/ML ~~LOC~~ SOLN: 15.0000 [IU] | Freq: Every day | SUBCUTANEOUS | 11 refills | Status: DC

## 2016-09-01 MED ORDER — HYDRALAZINE HCL 25 MG PO TABS: 25.0000 mg | ORAL_TABLET | Freq: Three times a day (TID) | ORAL | Status: DC

## 2016-09-01 MED ORDER — METHOCARBAMOL 500 MG PO TABS: 500.0000 mg | ORAL_TABLET | Freq: Three times a day (TID) | ORAL | Status: DC

## 2016-09-01 MED ORDER — SENNOSIDES-DOCUSATE SODIUM 8.6-50 MG PO TABS: 1.0000 | ORAL_TABLET | Freq: Every day | ORAL | Status: DC

## 2016-09-01 MED ORDER — METFORMIN HCL 500 MG PO TABS: 500.0000 mg | ORAL_TABLET | Freq: Two times a day (BID) | ORAL | Status: DC

## 2016-09-01 MED ORDER — PANTOPRAZOLE SODIUM 40 MG PO TBEC: 40.0000 mg | DELAYED_RELEASE_TABLET | Freq: Every day | ORAL | Status: DC

## 2016-09-01 MED ORDER — CYCLOBENZAPRINE HCL 5 MG PO TABS: 5.0000 mg | ORAL_TABLET | Freq: Three times a day (TID) | ORAL | 0 refills | Status: DC | PRN

## 2016-09-01 NOTE — Progress Notes (Signed)
Social Work  Discharge Note  The overall goal for the admission was met for:   Discharge location: [facility]  Length of Stay: Yes-13 DAYS  Discharge activity level: Yes-SUPERVISION WHEELCHAIR LEVEL  Home/community participation: Yes  Services provided included: MD, RD, PT, OT, RN, CM, TR, Pharmacy, Neuropsych and SW  Financial Services: Medicare and [place]  Follow-up services arranged: Other: SHORT TERM NHP BEFORE GOING HOME  Comments (or additional information):PACE AND PT/HUSBAND FELT NEEDED TO GO TO NH FOR SHORT TIME BEFORE GOING HOME DUE TO HER NEED OF 24 HR [place] HUSBAND WORKS 3-11 PM  Patient/Family verbalized understanding of follow-up arrangements: Yes  Individual responsible for coordination of the follow-up plan: SELF & JUMA-HUSBAND  Confirmed correct DME delivered: Elease Hashimoto 09/01/2016    Elease Hashimoto

## 2016-09-01 NOTE — Progress Notes (Signed)
Social Work Patient ID: Kimberly Lane, female   DOB: [birth year], 56 y.o.   MRN: [id number]  [place] with [facility] Social worker they will provide transportation to [place]. Have contacted and will sent their [name] transport to pick her up.

## 2016-09-01 NOTE — Progress Notes (Signed)
Patient picked up for discharge to [facility] report called.

## 2016-09-01 NOTE — Progress Notes (Signed)
[facility]YSICAL [facility]ICINE & REHABILITATION     PROGRESS NOTE  Subjective/Complaints:  Pt seen laying in bed this AM.  No reported issues. CBGs improved, but variable based on amount and type of diet.   ROS: Limited due to language   Objective: Vital Signs: Blood pressure (!) 149/59, pulse 73, temperature 98.9 F (37.2 C), temperature source Oral, resp. rate 16, height 5\' 2"  (1.575 m), weight 57.1 kg (125 lb 14.1 oz), SpO2 97 %. No results found.  Recent Labs  08/30/16 0501  WBC 5.9  HGB 9.8*  HCT 32.0*  PLT 406*    Recent Labs  08/31/16 0614 09/01/16 0532  NA 139 140  K 2.9* 3.3*  CL 101 104  GLUCOSE 156* 151*  BUN 19 17  CREATININE 0.62 0.65  CALCIUM 8.7* 8.6*   CBG (last 3)   Recent Labs  08/31/16 1652 08/31/16 2113 09/01/16 0648  GLUCAP 78 115* 136*    Wt Readings from Last 3 Encounters:  09/01/16 57.1 kg (125 lb 14.1 oz)  08/19/16 64.8 kg (142 lb 13.7 oz)  04/20/16 63.5 kg (140 lb)    Physical Exam:  BP (!) 149/59   Pulse 73   Temp 98.9 F (37.2 C) (Oral)   Resp 16   Ht 5\' 2"  (1.575 m)   Wt 57.1 kg (125 lb 14.1 oz) Comment: Simultaneous filing. User may not have seen previous data.  SpO2 97%   BMI 23.02 kg/m  Constitutional: She appears well-developed and well-nourished.  HENT: Normocephalic and atraumatic.  Eyes: EOMI. No discharge.   Cardiovascular: RRR. No JVD   Respiratory: Effort normal and breath sounds normal.  GI: Soft. Bowel sounds are normal.   Musculoskeletal: She exhibits edema and mild tenderness.  Neurological: She is alert.  She is able to follow simple motor commands.    Motor: B/l UE 4+/5 proximal to distal RLE: HF, KE 4+/5 (unchanged) LLE: HF 4+/5 (pain inhibition, unchanged)  Skin: L-AKA with staples in place, C/D/I.  Old R-BKA well healed.   Psychiatric: She is slowed, ? 2/2 language.    Assessment/Plan: 1. Functional deficits secondary to left AKA with history of right BKA which require 3+ hours per day of  interdisciplinary therapy in a comprehensive inpatient rehab setting. Physiatrist is providing close team supervision and 24 hour management of active medical problems listed below. Physiatrist and rehab team continue to assess barriers to discharge/monitor patient progress toward functional and medical goals.  Function:  Bathing Bathing position   Position: Other (comment) (sitting on BSC)  Bathing parts Body parts bathed by patient: Right arm, Left arm, Left upper leg, Right upper leg, Buttocks, Front perineal area, Abdomen, Chest Body parts bathed by helper: Back  Bathing assist Assist Level: Set up   Set up : To obtain items  Upper Body Dressing/Undressing Upper body dressing   What is the patient wearing?: Pull over shirt/dress     Pull over shirt/dress - Perfomed by patient: Thread/unthread right sleeve, Thread/unthread left sleeve, Put head through opening, Pull shirt over trunk          Upper body assist Assist Level: Set up   Set up : To obtain clothing/put away  Lower Body Dressing/Undressing Lower body dressing   What is the patient wearing?: Pants     Pants- Performed by patient: Thread/unthread right pants leg, Thread/unthread left pants leg, Pull pants up/down Pants- Performed by helper: Pull pants up/down  Lower body assist Assist for lower body dressing: Set up      [place] activity did not occur: No continent bowel/bladder event Toileting steps completed by patient: Adjust clothing prior to toileting, Performs perineal hygiene, Adjust clothing after toileting Toileting steps completed by helper: Adjust clothing prior to toileting, Adjust clothing after toileting [place]: Toilet aid  Toileting assist Assist level: Supervision or verbal cues   Transfers Chair/bed transfer   Chair/bed transfer method: Lateral scoot Chair/bed transfer assist level: Supervision or verbal cues Chair/bed  transfer assistive device: Armrests, Sliding board     Locomotion Ambulation Ambulation activity did not occur: Safety/medical concerns (BLE amputee)         Wheelchair   Type: Manual Max wheelchair distance: 150' Assist Level: Supervision or verbal cues  Cognition Comprehension Comprehension assist level: Understands basic 75 - 89% of the time/ requires cueing 10 - 24% of the time  Expression Expression assist level: Expresses complex 90% of the time/cues < 10% of the time  Social Interaction Social Interaction assist level: Interacts appropriately 75 - 89% of the time - Needs redirection for appropriate language or to initiate interaction.  Problem Solving Problem solving assist level: Solves basic 50 - 74% of the time/requires cueing 25 - 49% of the time  Memory Memory assist level: Recognizes or recalls 75 - 89% of the time/requires cueing 10 - 24% of the time    Medical Problem List and Plan: 1.  Limitations in mobility, transfers, cognition, and self-care secondary to left AKA with history of right BKA.   D/c today  Will see patient in 1 month for hospital follow up 2.  DVT Prophylaxis/Anticoagulation: Pharmaceutical: Lovenox 3. Pain Management: D/Ced scheduled hydrocodone.   Added tylenol qid with ultram or oxycodone prn  Gabapentin scheduled for phantom limb pain  Robaxin added 4. Mood: LCSW to follow for evaluation and support.  5. Neuropsych: This patient is ?capable of making decisions on her own behalf. 6. Skin/Wound Care: monitor wound daily for healing. Change Glucerna (may be contributing to BS elevation)  to prosource for low protein stores.  7. Fluids/Electrolytes/Nutrition: Monitor I/Os 8. T2DM uncontrolled: Hgb A1C- 11.4.  Received decadron by mistake on 6/28 with elevated BS. Monitor BS for with ac/hs CBG checks  Will use SSI for elevated BS and titrate meds as indicated.   Lantus increased to 20 on 7/6, increased to 24u 7/8, decreased to 19U on 7/10, decreased  to 15 on 7/11  Novolog 6 TID,decreased to 3U on 7/11  Metformin added 7/11  Improving, variable based on amount and type of diet - noncomplaint 9. HTN: Monitor BP bid  and titrate meds as indicated.  HCTZ DC'd and Chlorthalidone 50 mg started on 7/1, increased to 100 on 7/2   Cont norvasc.   Hydralizine 10 started 7/3, increased to 25 on 7/5  Monitor with increased mobility  Slightly elevated 7/12, will need further monitoring as outpt 10. Acute fluid overload:  Lasix discontinued. Continue hctz with daily weight for monitoring.  11. Nausea/?GERD/poor po intake: On protonix. Will discontinue scheduled hydrocodone as may be contributing to symptoms.  Augment bowel program 12. Leucocytosis: Resolved  Likely reactive and resolving. Monitor for trend and signs of infection.   Cont to monitor  13. Chest pain/heaviness: Has been having for months and takes 'some medications".   14. Anxiety disorder: Multiple somatic complaints--monitor for now. Team to provide ego support.  15. Hypokalemia  K+ 3.3 on 7/12  Supplementing daily, decreased  dose on 7/2, increased on 7/11 16. ABLA  Hb 9.8 on 7/10  Cont to monitor  LOS (Days) 13 A FACE TO FACE EVALUATION WAS PERFORMED  Ankit Lorie Phenix 09/01/2016 8:05 AM

## 2016-09-01 NOTE — Patient Care Conference (Signed)
Inpatient RehabilitationTeam Conference and Plan of Care Update Date: 08/31/2016   Time: 2:00 PM    Patient Name: Kimberly Lane      Medical Record Number: [id number]  Date of Birth: 10-27-1960 Sex: Female         Room/Bed: 4M10C/4M10C-01 Payor Info: Payor: [facility] / Plan: [facility] / Product Type: *No Product type* /    Admitting Diagnosis: Bil Amputee  Admit Date/Time:  08/19/2016  4:50 PM Admission Comments: No comment available   Primary Diagnosis:  Unilateral AKA, left (HCC) Principal Problem: Unilateral AKA, left [facility])  Patient Active Problem List   Diagnosis Date Noted  . Neuropathy LLE 08/31/2016  . Left hip pain 08/31/2016  . Labile blood pressure   . Labile blood glucose   . Acute blood loss anemia   . Hypokalemia   . Benign essential HTN   . Uncontrolled diabetes mellitus type 2 with peripheral artery disease ([place])   . Acute diastolic congestive heart failure ([place]) 08/19/2016  . Unilateral AKA, left ([place]) 08/19/2016  . Post-operative pain   . Leukocytosis   . Anxiety state   . S/P AKA (above knee amputation), left ([place]) 08/17/2016  . Acute respiratory failure ([place])   . Dyspnea   . Gangrene due to peripheral vascular disease ([place]) 08/09/2016  . PAD (peripheral artery disease) ([place]) 03/11/2016  . Dysuria 10/24/2014  . Osteomyelitis of left foot ([place])   . Cough   . Hx of right BKA ([place]) 05/26/2014  . S/P BKA (below knee amputation) unilateral, right ([place])   . Abnormal EKG   . Preop cardiovascular exam 05/21/2014  . Cellulitis of toe of right foot   . Peripheral arterial disease ([place])   . Cellulitis 05/13/2014  . Type 2 diabetes mellitus with complication ([place])   . HLD (hyperlipidemia)   . Pure hypercholesterolemia 11/14/2013  . High risk social situation 11/13/2013  . Back pain without radiation 09/05/2011    Class: Acute  . Osteoarthritis of left hip 05/31/2011  . History of primary TB 04/26/2011  . DM (diabetes mellitus), type 2,  uncontrolled ([place]) 05/17/2010  . Essential hypertension 12/09/2009    Expected Discharge Date: Expected Discharge Date: 09/01/16  Team Members Present: Physician leading conference: Dr. Delice Lesch Social Worker Present: Ovidio Kin, LCSW Nurse Present: Rozetta Nunnery, RN PT Present: Other (comment) Blair Dolphin) OT Present: Clyda Greener, OT SLP Present: Windell Moulding, SLP PPS Coordinator present : Daiva Nakayama, RN, CRRN     Current Status/Progress Goal Weekly Team Focus  Medical   Limitations in mobility, transfers, cognition, and self-care secondary to left AKA with history of right BKA.   Improve mobility, transfers, hypokalemia, DM, HTN  See above   Bowel/Bladder   mostly continent of bladder; contient of bowel LBM 7/9  pt will use callbell to call for assistance when she needs to urinate duirng rehab stay  monitor for incontinent episodes and offer BSC q3hr while awake and encourage call bell use with translator   Swallow/Nutrition/ Hydration             ADL's   supervision overall with occasional A to don R prosthesis, pt is close to meeting all goals  supervision overall  pt/family education, ADL retraining, transfers   Mobility   supervision level slideboard transfers; min to mod assist uneven transfers with slideboard; supervision w/c mobility  supervision level overall w/c level  family education?, d/c planning, uneven transfers (especially for car), strengthening, endurance   Communication  Safety/Cognition/ Behavioral Observations            Pain   scheduled tramadol; won't c/o pain unless you ask consistently  <3  assess for pain and nonverbal signs of pain q shift and prn   Skin   old R BKA and new L AKA OTA and staples  skin free of infection and breakdown duirng rehab stay  assess skin q shift and prn      *See Care Plan and progress notes for long and short-term goals.     Barriers to Discharge  Current Status/Progress Possible Resolutions  Date Resolved   Physician              Therapies, optimize DM/HTN meds, educate Pt and family, suplement K+     09/01/2016   Nursing                    PT                      OT                    SLP                  SW                Discharge Planning/Teaching Needs:  Claudia Desanctis feels pt will need to go to [facility] before going home with husband since she needs 24 hr supervision level.      Team Discussion:  Pt reaching her supervision level goals, still somewhat medically compromised with her BS and HTN-MD continues to adjust medications. Pain issues managed. Both pt and husband agreeable to transfer to [facility] tomorrow  Revisions to Treatment Plan:  DC to [facility]-7/12    Continued Need for Acute Rehabilitation Level of Care: The patient requires daily medical management by a physician with specialized training in physical medicine and rehabilitation for the following conditions: Daily direction of a multidisciplinary physical rehabilitation program to ensure safe treatment while eliciting the highest outcome that is of practical value to the patient.: Yes Daily medical management of patient stability for increased activity during participation in an intensive rehabilitation regime.: Yes Daily analysis of laboratory values and/or radiology reports with any subsequent need for medication adjustment of medical intervention for : Post surgical problems;Diabetes problems;Blood pressure problems;Wound care problems;Other  Elease Hashimoto 09/01/2016, 8:37 AM

## 2016-09-02 ENCOUNTER — Encounter: Payer: Self-pay | Admitting: Vascular Surgery

## 2016-09-02 ENCOUNTER — Ambulatory Visit: Payer: Medicaid Other | Admitting: Vascular Surgery

## 2016-09-02 VITALS — BP 140/75 | HR 77 | Temp 97.4°F | Resp 18 | Ht 62.0 in | Wt 125.0 lb

## 2016-09-02 DIAGNOSIS — I739 Peripheral vascular disease, unspecified: Secondary | ICD-10-CM

## 2016-09-02 NOTE — Progress Notes (Signed)
Subjective:     Patient ID: Kimberly Lane, female   DOB: 1961/01/05, 56 y.o.   MRN: [id number]  HPI Kimberly Lane returns today for follow-up of left above-the-knee amputation performed for gangrene left lower extremity with non-reconstructable vascular disease. She is now a nursing home. She does have pain at the amputation site but is overall doing well. History obtained from interpreter today.   Review of Systems Left aka pain    Objective:   Physical Exam Awake and alert Left aka healing well with staples    Assessment/plan     56 year old female returns for follow-up of left above-the-knee of dictation. It is healing well and will get her staples out today. She can follow-up on a when necessary basis if she has issues going forward.  Brandon C. Donzetta Matters, MD [facility] Office: [phone] Pager: [phone]

## 2016-09-08 ENCOUNTER — Other Ambulatory Visit: Payer: Self-pay | Admitting: Internal Medicine

## 2016-09-08 DIAGNOSIS — R221 Localized swelling, mass and lump, neck: Secondary | ICD-10-CM

## 2016-09-08 DIAGNOSIS — M542 Cervicalgia: Secondary | ICD-10-CM

## 2016-09-15 ENCOUNTER — Ambulatory Visit
Admission: RE | Admit: 2016-09-15 | Discharge: 2016-09-15 | Disposition: A | Discharge status: Home / Self Care | Payer: No Typology Code available for payment source | Source: Ambulatory Visit | Attending: Internal Medicine | Admitting: Internal Medicine

## 2016-09-15 DIAGNOSIS — R221 Localized swelling, mass and lump, neck: Secondary | ICD-10-CM

## 2016-09-15 DIAGNOSIS — M542 Cervicalgia: Secondary | ICD-10-CM

## 2016-09-29 ENCOUNTER — Telehealth: Payer: Self-pay

## 2016-09-29 ENCOUNTER — Encounter: Payer: Self-pay | Admitting: Physical Medicine & Rehabilitation

## 2016-09-29 ENCOUNTER — Encounter
Payer: No Typology Code available for payment source | Attending: Physical Medicine & Rehabilitation | Admitting: Physical Medicine & Rehabilitation

## 2016-09-29 VITALS — BP 159/72 | HR 75

## 2016-09-29 DIAGNOSIS — D62 Acute posthemorrhagic anemia: Secondary | ICD-10-CM

## 2016-09-29 DIAGNOSIS — I10 Essential (primary) hypertension: Secondary | ICD-10-CM | POA: Diagnosis not present

## 2016-09-29 DIAGNOSIS — H539 Unspecified visual disturbance: Secondary | ICD-10-CM | POA: Insufficient documentation

## 2016-09-29 DIAGNOSIS — G8918 Other acute postprocedural pain: Secondary | ICD-10-CM | POA: Diagnosis not present

## 2016-09-29 DIAGNOSIS — E1151 Type 2 diabetes mellitus with diabetic peripheral angiopathy without gangrene: Secondary | ICD-10-CM | POA: Insufficient documentation

## 2016-09-29 DIAGNOSIS — F419 Anxiety disorder, unspecified: Secondary | ICD-10-CM | POA: Insufficient documentation

## 2016-09-29 DIAGNOSIS — Z89511 Acquired absence of right leg below knee: Secondary | ICD-10-CM | POA: Diagnosis not present

## 2016-09-29 DIAGNOSIS — Z89612 Acquired absence of left leg above knee: Secondary | ICD-10-CM | POA: Insufficient documentation

## 2016-09-29 DIAGNOSIS — Z609 Problem related to social environment, unspecified: Secondary | ICD-10-CM

## 2016-09-29 DIAGNOSIS — E118 Type 2 diabetes mellitus with unspecified complications: Secondary | ICD-10-CM

## 2016-09-29 DIAGNOSIS — E1165 Type 2 diabetes mellitus with hyperglycemia: Secondary | ICD-10-CM | POA: Insufficient documentation

## 2016-09-29 DIAGNOSIS — E876 Hypokalemia: Secondary | ICD-10-CM | POA: Insufficient documentation

## 2016-09-29 DIAGNOSIS — R269 Unspecified abnormalities of gait and mobility: Secondary | ICD-10-CM | POA: Insufficient documentation

## 2016-09-29 NOTE — Progress Notes (Addendum)
Subjective:    Patient ID: Kimberly Lane, female    DOB: 25-Jun-1960, 56 y.o.   MRN: [id number]  HPI 56 year old [name] woman with uncontrolled T2DM, HTN, PAD s/p R-BKA, PVD  And L-SFA stent and progressive necrotic changes of right great toe presents for hospital follow up after receiving CIR for left AKA with history of right AKA.  She was discharged to SNF.  Interpretor present. Pt extremely poor historian. Since that time, she has been discharged home.  No one has checked her labs since discharge.  She has not checked her CBGs either.  She received DME from SNF.She has not seen Vasc surgery.  Her BP remains elevated. She states she does have a PCP. She notes visual disturbance with ?floaters vs. Intermittent ?blindness. Denies falls.   Pain Inventory Average Pain 7 Pain Right Now 6 My pain is burning and aching  In the last 24 hours, has pain interfered with the following? General activity 7 Relation with others 7 Enjoyment of life 7 What TIME of day is your pain at its worst? night Sleep (in general) Good  Pain is worse with: pressure Pain improves with: rest and medication Relief from Meds: 9  Mobility walk with assistance ability to climb steps?  no do you drive?  no use a wheelchair  Function not employed: date last employed .  Neuro/Psych weakness numbness tremor tingling trouble walking spasms dizziness  Prior Studies Any changes since last visit?  no  Physicians involved in your care Any changes since last visit?  no   Family History  Problem Relation Age of Onset  . Heart disease Unknown        No family history   Social History   Social History  . Marital status: Married    Spouse name: N/A  . Number of children: N/A  . Years of education: N/A   Social History Main Topics  . Smoking status: Never Smoker  . Smokeless tobacco: Never Used  . Alcohol use No  . Drug use: No  . Sexual activity: Yes    Birth control/ protection:  Post-menopausal   Other Topics Concern  . None   Social History Narrative   ** Merged History Encounter **       Past Surgical History:  Procedure Laterality Date  . ABDOMINAL AORTAGRAM N/A 05/20/2014   Procedure: ABDOMINAL Maxcine Ham;  Surgeon: Serafina Mitchell, MD;  [facility] CATH LAB;  Service: Cardiovascular;  Laterality: N/A;  . AMPUTATION Right 05/23/2014   Procedure: AMPUTATION BELOW KNEE;  Surgeon: Mal Misty, MD;  Location: [place];  Service: Vascular;  Laterality: Right;  . AMPUTATION Left 08/09/2016   Procedure: LEFT GREAT TOE AMPUTATION;  Surgeon: Waynetta Sandy, MD;  Location: [place];  Service: Vascular;  Laterality: Left;  . AMPUTATION Left 08/15/2016   Procedure: AMPUTATION ABOVE KNEE;  Surgeon: Rosetta Posner, MD;  Location: [place];  Service: Vascular;  Laterality: Left;  . PERIPHERAL VASCULAR CATHETERIZATION N/A 03/11/2016   Procedure: Abdominal Aortogram w/Lower Extremity;  Surgeon: Elam Dutch, MD;  Location: [facility] CV LAB;  Service: Cardiovascular;  Laterality: N/A;  . PERIPHERAL VASCULAR CATHETERIZATION Left 03/11/2016   Procedure: Peripheral Vascular Intervention;  Surgeon: Elam Dutch, MD;  Location: [place] CV LAB;  Service: Cardiovascular;  Laterality: Left;  Superficial femoral   Past Medical History:  Diagnosis Date  . Allergy   . Anemia   . Anxiety   . Cellulitis and abscess of foot 05/13/2014  rt foot  . Diabetes mellitus   . Hypertension   . Positive TB test 2013   BP (!) 159/72   Pulse 75   SpO2 96%   Opioid Risk Score:   Fall Risk Score:  `1  Depression screen PHQ 2/9  Depression screen [facility] 2/9 02/04/2015 01/20/2015 10/23/2014 08/06/2014 07/11/2014 05/13/2014 01/15/2014  Decreased Interest 1 0 0 0 0 0 1  Down, Depressed, Hopeless 1 0 0 0 0 0 0  PHQ - 2 Score 2 0 0 0 0 0 1  Altered sleeping 1 - - - - - -  Tired, decreased energy 1 - - - - - -  Change in appetite 1 - - - - - -  Feeling bad or failure about yourself  1  - - - - - -  Trouble concentrating 0 - - - - - -  Moving slowly or fidgety/restless 0 - - - - - -  Suicidal thoughts 0 - - - - - -  PHQ-9 Score 6 - - - - - -  Difficult doing work/chores Somewhat difficult - - - - - -     Review of Systems  Constitutional: Negative.   HENT: Negative.   Eyes: Negative.   Respiratory: Negative.   Cardiovascular: Negative.   Gastrointestinal: Negative.   Endocrine: Negative.   Genitourinary: Negative.   Musculoskeletal: Positive for gait problem.  Skin: Negative.   Allergic/Immunologic: Negative.   Neurological:       Visual disturbance, intermittent  Sees double, then all white, then dark, then normal  Hematological: Negative.   Psychiatric/Behavioral: Negative.   All other systems reviewed and are negative.      Objective:   Physical Exam Constitutional: She appears well-developed and well-nourished.  HENT: Normocephalic and atraumatic.  Eyes: EOMI. No discharge.   Cardiovascular: RRR. No JVD   Respiratory: Effort normaland breath sounds normal.  GI: Soft. Bowel sounds are normal.   Musculoskeletal: She exhibits edema and mild tenderness.  Neurological: She is alert.  She is able to follow simple motor commands.    Motor: B/l UE 4+/5 proximal to distal RLE: HF, KE 4+/5 LLE: HF 4+/5  Skin: L-AKA C/D/I, healing.  Old R-BKA well healed.   Psychiatric: She is slowed,      Assessment & Plan:  56 year old [name] woman with uncontrolled T2DM, HTN, PAD s/p R-BKA, PVD  And L-SFA stent and progressive necrotic changes of right great toe presents for hospital follow up after receiving CIR for left AKA with history of right AKA.  1. Limitations in mobility, transfers, cognition, and self-care secondary to left AKA with history of right BKA.   Encouraged HEP  Follow up Vascular Surgery - does not have appointment  2. Pain Management  Controlled at present  3. T2DM uncontrolled  Hgb A1C- 11.4.    Has not been checking CBGs  She is  not aware of her medications, but is not taking any injectables  Encouraged follow up with Dr. Jimmye Norman, with [facility]  4. HTN  Elevated  Has not been checking  Encouraged follow up with Dr. Jimmye Norman, with [facility]  5. Hypokalemia  Has not been checked since discharge  Labs ordered  6. ABLA  Has not been checked since discharge  Labs ordered  7. Visual disturbance  Will refer to Opthalmology   8. Gait abnormality  Cont wheelchair for safety  Addendum: Later received a call from Dr. Dimas Alexandria clarifying issues discussed with patient.  Dr. Jimmye Norman is following up with  patient's labs, making adjustments to BP meds, and checking CBGs.  There are also several psychosocial issues, with recent history of suicide attempt, which are being addressed as well.

## 2016-09-29 NOTE — Telephone Encounter (Signed)
ERROR

## 2016-09-29 NOTE — Patient Instructions (Signed)
Please follow up with Dr. Jimmye Norman ([facility]) regarding high blood pressure  Please follow up with Dr. Jimmye Norman ([facility]) regarding blood glucose  Please take lab results to Dr. Jimmye Norman ([facility])  Please follow up with Vascular Surgery  Please follow up with Opthalmology

## 2016-11-02 ENCOUNTER — Ambulatory Visit: Payer: No Typology Code available for payment source | Admitting: Physical Medicine & Rehabilitation

## 2016-11-30 ENCOUNTER — Other Ambulatory Visit: Payer: Self-pay | Admitting: Internal Medicine

## 2016-11-30 ENCOUNTER — Ambulatory Visit: Payer: No Typology Code available for payment source | Admitting: Physical Medicine & Rehabilitation

## 2016-11-30 DIAGNOSIS — E042 Nontoxic multinodular goiter: Secondary | ICD-10-CM

## 2016-12-05 ENCOUNTER — Ambulatory Visit
Admission: RE | Admit: 2016-12-05 | Discharge: 2016-12-05 | Disposition: A | Discharge status: Home / Self Care | Payer: No Typology Code available for payment source | Source: Ambulatory Visit | Attending: Internal Medicine | Admitting: Internal Medicine

## 2016-12-05 DIAGNOSIS — E042 Nontoxic multinodular goiter: Secondary | ICD-10-CM

## 2017-02-14 IMAGING — CR DG CHEST 2V
2 series · 2 of 2 positions shown · non-contrast
Comparison: None.

CLINICAL DATA: Persistent cough.  Initial encounter.

EXAM:
CHEST  2 VIEW

[x chest ap]
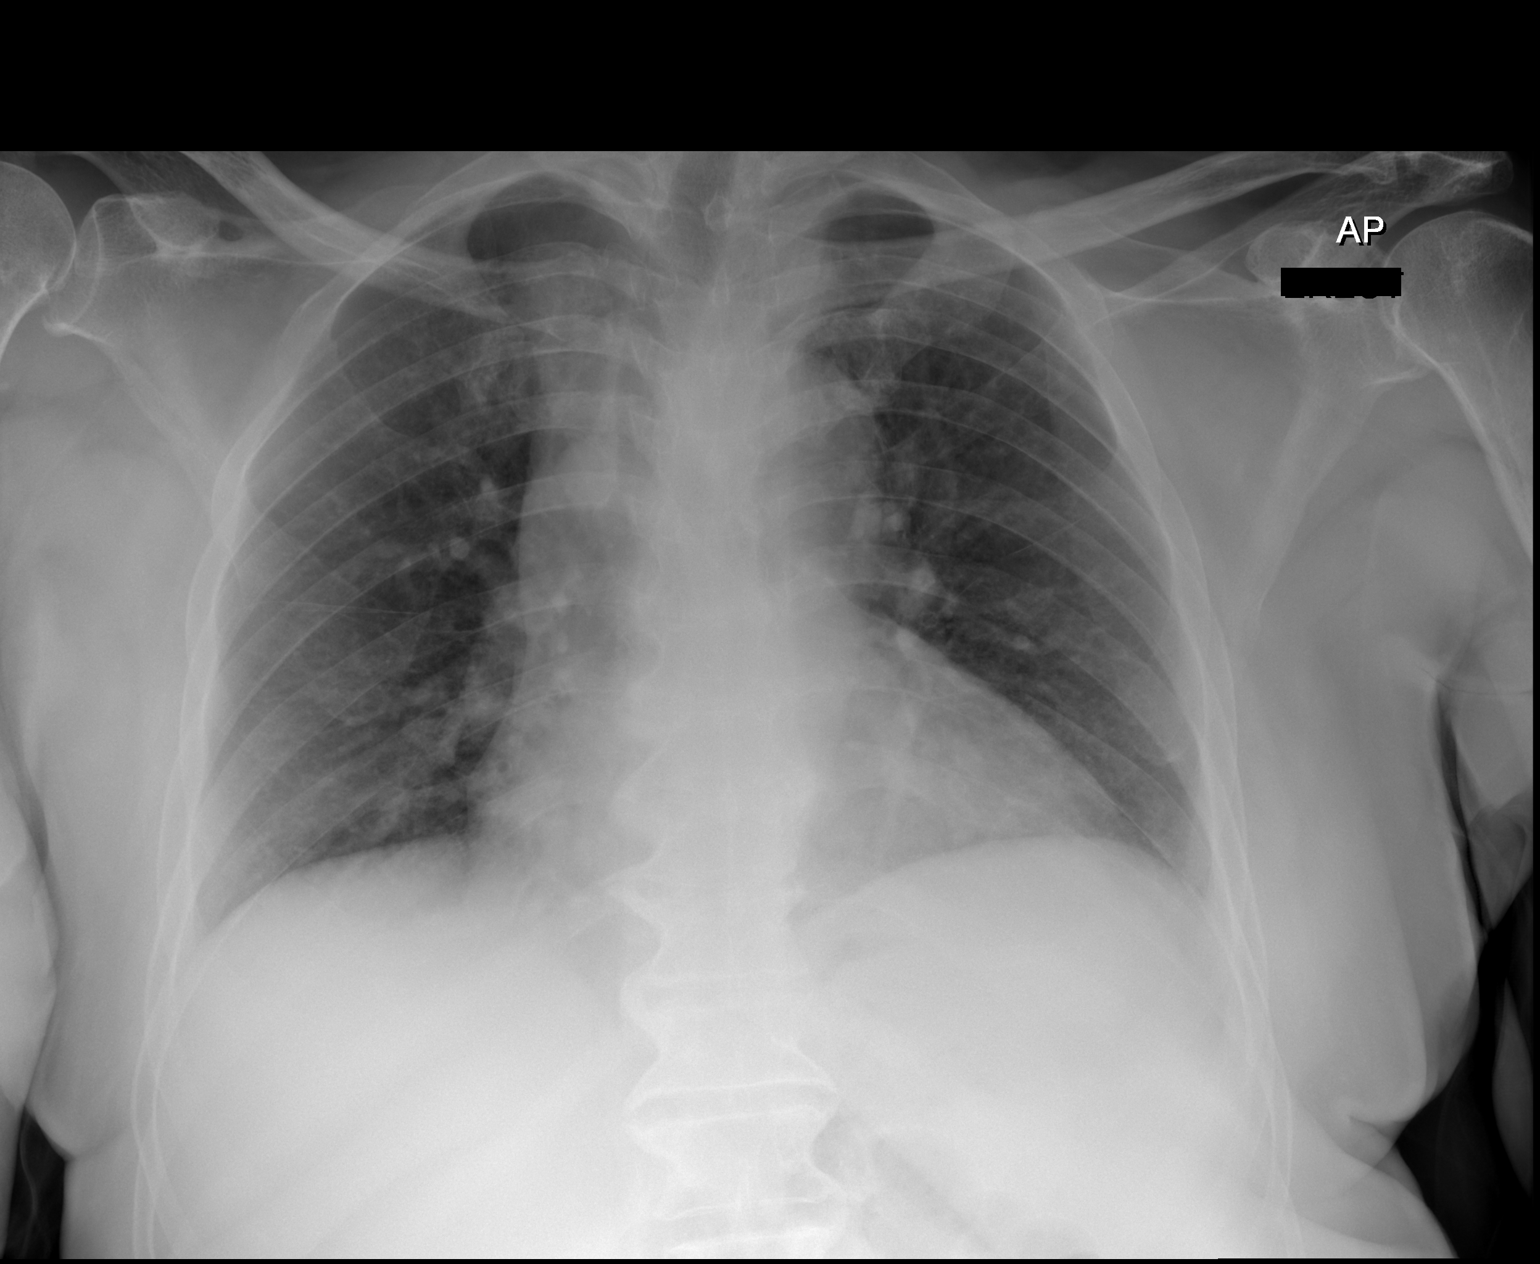

[w chest lat]
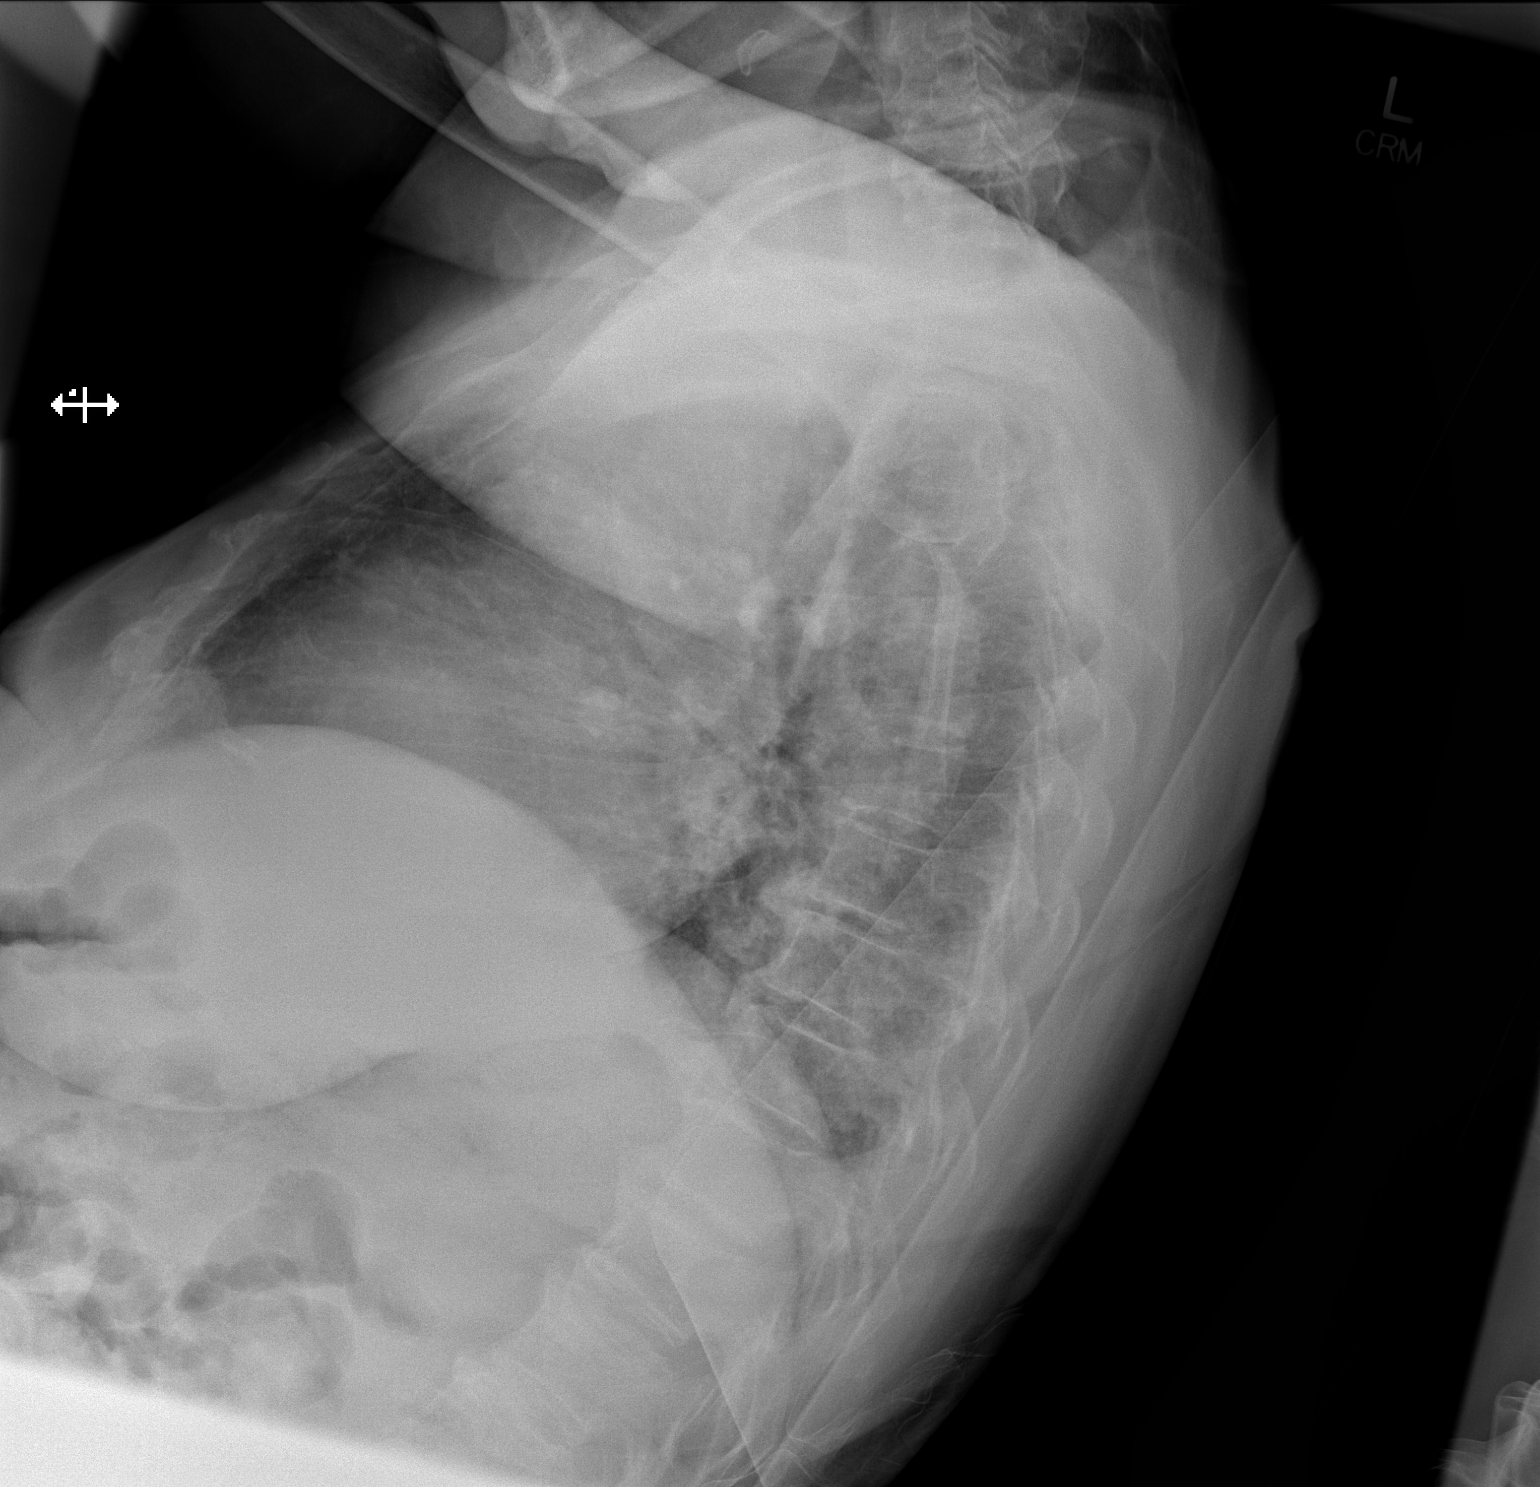

[2 of 2 positions shown; findings below may reference images not displayed]

FINDINGS: Cardiopericardial silhouette mildly enlarged. No airspace disease.
No effusion. Lung volumes are lower than on prior, a which produces
pulmonary vascular crowding. Mediastinal contours appear within
normal limits allowing for lung volumes.
IMPRESSION: Low volume chest.  No acute cardiopulmonary disease.

## 2017-08-30 ENCOUNTER — Encounter: Payer: Self-pay | Admitting: Internal Medicine

## 2018-02-01 ENCOUNTER — Other Ambulatory Visit: Payer: Self-pay | Admitting: Nurse Practitioner

## 2018-02-01 DIAGNOSIS — N6310 Unspecified lump in the right breast, unspecified quadrant: Secondary | ICD-10-CM

## 2018-02-07 ENCOUNTER — Ambulatory Visit
Admission: RE | Admit: 2018-02-07 | Discharge: 2018-02-07 | Disposition: A | Discharge status: Home / Self Care | Payer: PRIVATE HEALTH INSURANCE | Source: Ambulatory Visit | Attending: Nurse Practitioner | Admitting: Nurse Practitioner

## 2018-02-07 DIAGNOSIS — N6310 Unspecified lump in the right breast, unspecified quadrant: Secondary | ICD-10-CM

## 2018-02-28 ENCOUNTER — Emergency Department (HOSPITAL_COMMUNITY): Payer: Medicare (Managed Care)

## 2018-02-28 ENCOUNTER — Encounter (HOSPITAL_COMMUNITY): Payer: Self-pay | Admitting: Emergency Medicine

## 2018-02-28 ENCOUNTER — Other Ambulatory Visit: Payer: Self-pay

## 2018-02-28 ENCOUNTER — Observation Stay (HOSPITAL_COMMUNITY)
Admission: EM | Admit: 2018-02-28 | Discharge: 2018-03-01 | Disposition: A | Discharge status: Home / Self Care | Payer: Medicare (Managed Care) | Attending: Internal Medicine | Admitting: Internal Medicine

## 2018-02-28 ENCOUNTER — Observation Stay (HOSPITAL_COMMUNITY)
Admission: RE | Admit: 2018-02-28 | Discharge: 2018-02-28 | Disposition: A | Discharge status: Home / Self Care | Payer: Medicare (Managed Care) | Source: Ambulatory Visit | Attending: Internal Medicine | Admitting: Internal Medicine

## 2018-02-28 ENCOUNTER — Observation Stay: Admission: AD | Admit: 2018-02-28 | Payer: Medicare (Managed Care) | Source: Ambulatory Visit | Admitting: Oncology

## 2018-02-28 ENCOUNTER — Other Ambulatory Visit: Payer: Self-pay | Admitting: Internal Medicine

## 2018-02-28 DIAGNOSIS — E7800 Pure hypercholesterolemia, unspecified: Secondary | ICD-10-CM | POA: Insufficient documentation

## 2018-02-28 DIAGNOSIS — E876 Hypokalemia: Secondary | ICD-10-CM

## 2018-02-28 DIAGNOSIS — D649 Anemia, unspecified: Secondary | ICD-10-CM | POA: Diagnosis not present

## 2018-02-28 DIAGNOSIS — N179 Acute kidney failure, unspecified: Secondary | ICD-10-CM | POA: Insufficient documentation

## 2018-02-28 DIAGNOSIS — Z79899 Other long term (current) drug therapy: Secondary | ICD-10-CM | POA: Insufficient documentation

## 2018-02-28 DIAGNOSIS — E559 Vitamin D deficiency, unspecified: Secondary | ICD-10-CM | POA: Insufficient documentation

## 2018-02-28 DIAGNOSIS — F329 Major depressive disorder, single episode, unspecified: Secondary | ICD-10-CM | POA: Diagnosis not present

## 2018-02-28 DIAGNOSIS — I160 Hypertensive urgency: Secondary | ICD-10-CM | POA: Diagnosis not present

## 2018-02-28 DIAGNOSIS — E1151 Type 2 diabetes mellitus with diabetic peripheral angiopathy without gangrene: Secondary | ICD-10-CM | POA: Insufficient documentation

## 2018-02-28 DIAGNOSIS — I739 Peripheral vascular disease, unspecified: Secondary | ICD-10-CM | POA: Insufficient documentation

## 2018-02-28 DIAGNOSIS — F419 Anxiety disorder, unspecified: Secondary | ICD-10-CM | POA: Diagnosis not present

## 2018-02-28 DIAGNOSIS — R109 Unspecified abdominal pain: Secondary | ICD-10-CM | POA: Diagnosis not present

## 2018-02-28 DIAGNOSIS — Z794 Long term (current) use of insulin: Secondary | ICD-10-CM | POA: Insufficient documentation

## 2018-02-28 DIAGNOSIS — I700 Atherosclerosis of aorta: Secondary | ICD-10-CM | POA: Diagnosis not present

## 2018-02-28 DIAGNOSIS — K5909 Other constipation: Secondary | ICD-10-CM | POA: Insufficient documentation

## 2018-02-28 DIAGNOSIS — Z89612 Acquired absence of left leg above knee: Secondary | ICD-10-CM | POA: Insufficient documentation

## 2018-02-28 DIAGNOSIS — Z7902 Long term (current) use of antithrombotics/antiplatelets: Secondary | ICD-10-CM | POA: Diagnosis not present

## 2018-02-28 DIAGNOSIS — M1612 Unilateral primary osteoarthritis, left hip: Secondary | ICD-10-CM | POA: Insufficient documentation

## 2018-02-28 DIAGNOSIS — Z7982 Long term (current) use of aspirin: Secondary | ICD-10-CM | POA: Insufficient documentation

## 2018-02-28 DIAGNOSIS — R1011 Right upper quadrant pain: Secondary | ICD-10-CM

## 2018-02-28 DIAGNOSIS — R1110 Vomiting, unspecified: Secondary | ICD-10-CM

## 2018-02-28 DIAGNOSIS — E785 Hyperlipidemia, unspecified: Secondary | ICD-10-CM | POA: Insufficient documentation

## 2018-02-28 DIAGNOSIS — E119 Type 2 diabetes mellitus without complications: Secondary | ICD-10-CM | POA: Insufficient documentation

## 2018-02-28 DIAGNOSIS — Z993 Dependence on wheelchair: Secondary | ICD-10-CM | POA: Insufficient documentation

## 2018-02-28 DIAGNOSIS — Z89511 Acquired absence of right leg below knee: Secondary | ICD-10-CM | POA: Diagnosis not present

## 2018-02-28 DIAGNOSIS — R0789 Other chest pain: Secondary | ICD-10-CM

## 2018-02-28 LAB — I-STAT TROPONIN, ED: Troponin i, poc: 0.03 ng/mL (ref 0.00–0.08)

## 2018-02-28 LAB — COMPREHENSIVE METABOLIC PANEL
ALT: 12 U/L (ref 0–44)
AST: 18 U/L (ref 15–41)
Albumin: 3 g/dL — ABNORMAL LOW (ref 3.5–5.0)
Alkaline Phosphatase: 85 U/L (ref 38–126)
Anion gap: 10 (ref 5–15)
BUN: 18 mg/dL (ref 6–20)
CO2: 28 mmol/L (ref 22–32)
Calcium: 9 mg/dL (ref 8.9–10.3)
Chloride: 98 mmol/L (ref 98–111)
Creatinine, Ser: 1.51 mg/dL — ABNORMAL HIGH (ref 0.44–1.00)
GFR calc Af Amer: 43 mL/min — ABNORMAL LOW (ref >60)
GFR calc non Af Amer: 37 mL/min — ABNORMAL LOW (ref >60)
Glucose, Bld: 112 mg/dL — ABNORMAL HIGH (ref 70–99)
POTASSIUM: 3 mmol/L — AB (ref 3.5–5.1)
Sodium: 136 mmol/L (ref 135–145)
TOTAL PROTEIN: 7.5 g/dL (ref 6.5–8.1)
Total Bilirubin: 0.7 mg/dL (ref 0.3–1.2)

## 2018-02-28 LAB — CBC
HCT: 36 % (ref 36.0–46.0)
Hemoglobin: 11.3 g/dL — ABNORMAL LOW (ref 12.0–15.0)
MCH: 25.6 pg — AB (ref 26.0–34.0)
MCHC: 31.4 g/dL (ref 30.0–36.0)
MCV: 81.4 fL (ref 80.0–100.0)
Platelets: 322 10*3/uL (ref 150–400)
RBC: 4.42 MIL/uL (ref 3.87–5.11)
RDW: 14 % (ref 11.5–15.5)
WBC: 7 10*3/uL (ref 4.0–10.5)
nRBC: 0 % (ref 0.0–0.2)

## 2018-02-28 LAB — POCT I-STAT CREATININE: Creatinine, Ser: 1.7 mg/dL — ABNORMAL HIGH (ref 0.44–1.00)

## 2018-02-28 LAB — LIPASE, BLOOD: Lipase: 39 U/L (ref 11–51)

## 2018-02-28 LAB — I-STAT BETA HCG BLOOD, ED (MC, WL, AP ONLY): HCG, QUANTITATIVE: 6.1 m[IU]/mL — AB (ref <5)

## 2018-02-28 LAB — CBG MONITORING, ED: GLUCOSE-CAPILLARY: 99 mg/dL (ref 70–99)

## 2018-02-28 MED ORDER — GABAPENTIN 100 MG PO CAPS
100.0000 mg | ORAL_CAPSULE | Freq: Three times a day (TID) | ORAL | Status: DC
  Administered 2018-03-01 (×2): 100 mg via ORAL
  Filled 2018-02-28 (×2): qty 1

## 2018-02-28 MED ORDER — LABETALOL HCL 5 MG/ML IV SOLN
5.0000 mg | Freq: Once | INTRAVENOUS | Status: AC
  Administered 2018-03-01: 5 mg via INTRAVENOUS
  Filled 2018-02-28: qty 4

## 2018-02-28 MED ORDER — AMLODIPINE-ATORVASTATIN 10-40 MG PO TABS: 1.0000 | ORAL_TABLET | Freq: Every day | ORAL | Status: DC

## 2018-02-28 MED ORDER — CHLORTHALIDONE 50 MG PO TABS: 100.0000 mg | ORAL_TABLET | Freq: Every day | ORAL | Status: DC

## 2018-02-28 MED ORDER — SENNOSIDES-DOCUSATE SODIUM 8.6-50 MG PO TABS: 1.0000 | ORAL_TABLET | Freq: Every evening | ORAL | Status: DC | PRN

## 2018-02-28 MED ORDER — CITALOPRAM HYDROBROMIDE 10 MG PO TABS
30.0000 mg | ORAL_TABLET | Freq: Every day | ORAL | Status: DC
  Administered 2018-03-01: 30 mg via ORAL
  Filled 2018-02-28: qty 3

## 2018-02-28 MED ORDER — INSULIN ASPART 100 UNIT/ML ~~LOC~~ SOLN
0.0000 [IU] | Freq: Three times a day (TID) | SUBCUTANEOUS | Status: DC
  Administered 2018-03-01: 5 [IU] via SUBCUTANEOUS
  Administered 2018-03-01: 2 [IU] via SUBCUTANEOUS

## 2018-02-28 MED ORDER — HYDRALAZINE HCL 20 MG/ML IJ SOLN
10.0000 mg | Freq: Once | INTRAMUSCULAR | Status: AC
  Administered 2018-02-28: 10 mg via INTRAVENOUS
  Filled 2018-02-28: qty 1

## 2018-02-28 MED ORDER — ASPIRIN 81 MG PO CHEW
81.0000 mg | CHEWABLE_TABLET | Freq: Every day | ORAL | Status: DC
  Administered 2018-03-01: 81 mg via ORAL
  Filled 2018-02-28 (×2): qty 1

## 2018-02-28 MED ORDER — POTASSIUM CHLORIDE CRYS ER 20 MEQ PO TBCR
40.0000 meq | EXTENDED_RELEASE_TABLET | Freq: Once | ORAL | Status: AC
  Administered 2018-02-28: 40 meq via ORAL
  Filled 2018-02-28: qty 2

## 2018-02-28 MED ORDER — LACTATED RINGERS IV SOLN: INTRAVENOUS | Status: DC

## 2018-02-28 MED ORDER — IOHEXOL 300 MG/ML  SOLN
100.0000 mL | Freq: Once | INTRAMUSCULAR | Status: AC | PRN
  Administered 2018-02-28: 100 mL via INTRAVENOUS

## 2018-02-28 MED ORDER — PROMETHAZINE HCL 25 MG PO TABS: 12.5000 mg | ORAL_TABLET | Freq: Four times a day (QID) | ORAL | Status: DC | PRN

## 2018-02-28 MED ORDER — PANTOPRAZOLE SODIUM 40 MG PO TBEC
40.0000 mg | DELAYED_RELEASE_TABLET | Freq: Every day | ORAL | Status: DC
  Administered 2018-03-01: 40 mg via ORAL
  Filled 2018-02-28: qty 1

## 2018-02-28 MED ORDER — HYDRALAZINE HCL 25 MG PO TABS
25.0000 mg | ORAL_TABLET | Freq: Three times a day (TID) | ORAL | Status: DC
  Filled 2018-02-28 (×2): qty 1

## 2018-02-28 MED ORDER — HEPARIN SODIUM (PORCINE) 5000 UNIT/ML IJ SOLN
5000.0000 [IU] | Freq: Three times a day (TID) | INTRAMUSCULAR | Status: DC
  Administered 2018-03-01 (×2): 5000 [IU] via SUBCUTANEOUS
  Filled 2018-02-28 (×2): qty 1

## 2018-02-28 MED ORDER — SODIUM CHLORIDE 0.9 % IV BOLUS
1000.0000 mL | Freq: Once | INTRAVENOUS | Status: AC
  Administered 2018-02-28: 1000 mL via INTRAVENOUS

## 2018-02-28 MED ORDER — CLOPIDOGREL BISULFATE 75 MG PO TABS
75.0000 mg | ORAL_TABLET | Freq: Every day | ORAL | Status: DC
  Administered 2018-03-01: 75 mg via ORAL
  Filled 2018-02-28 (×2): qty 1

## 2018-02-28 MED ORDER — ACETAMINOPHEN 650 MG RE SUPP: 650.0000 mg | Freq: Four times a day (QID) | RECTAL | Status: DC | PRN

## 2018-02-28 MED ORDER — ACETAMINOPHEN 325 MG PO TABS: 650.0000 mg | ORAL_TABLET | Freq: Four times a day (QID) | ORAL | Status: DC | PRN

## 2018-02-28 MED ORDER — ATORVASTATIN CALCIUM 40 MG PO TABS
40.0000 mg | ORAL_TABLET | Freq: Every day | ORAL | Status: DC
  Filled 2018-02-28: qty 1

## 2018-02-28 MED ORDER — INSULIN GLARGINE 100 UNIT/ML ~~LOC~~ SOLN
10.0000 [IU] | Freq: Every day | SUBCUTANEOUS | Status: DC
  Administered 2018-03-01: 10 [IU] via SUBCUTANEOUS
  Filled 2018-02-28 (×2): qty 0.1

## 2018-02-28 MED ORDER — AMLODIPINE BESYLATE 5 MG PO TABS
5.0000 mg | ORAL_TABLET | Freq: Every day | ORAL | Status: DC
  Filled 2018-02-28: qty 1

## 2018-02-28 MED ORDER — LACTATED RINGERS IV SOLN
INTRAVENOUS | Status: DC
  Administered 2018-03-01 (×2): via INTRAVENOUS

## 2018-02-28 MED ORDER — ONDANSETRON HCL 4 MG/2ML IJ SOLN
4.0000 mg | Freq: Once | INTRAMUSCULAR | Status: AC
  Administered 2018-02-28: 4 mg via INTRAVENOUS
  Filled 2018-02-28: qty 2

## 2018-02-28 NOTE — H&P (Addendum)
Date: 03/01/2018               Patient Name:  Kimberly Lane MRN: [id number]  DOB: 1960/08/02 Age / Sex: 58 y.o., female   PCP: Angelica Pou, MD         Medical Service: Internal Medicine Teaching Service         Attending Physician: Dr. Lucious Groves, DO    First Contact: Dr. Alfonse Spruce Pager: [phone]  Second Contact: Dr. Frederico Hamman Pager: [phone]       After Hours (After 5p/  First Contact Pager: [phone]  weekends / holidays): Second Contact Pager: [phone]   Chief Complaint: chest pain   History of Present Illness: [name] is a 58 yo Arabic speaking ([place]), wheelchair bound female with DM, HTN and PAD s/p right BKA and left AKA presenting with a one month history of right sided chest wall pain and 3 days of n/v and dysuria.    She was initially evaluated by her PCP who noted abdominal tenderness/distension, tympanic to percussion and hypoactive bowel sounds - concern for gall bladder disease vs SBO vs pancreatitis so she was sent to the ED for further evaluation.   History was obtained using assistance of interpreter.  Patient reports 1 month history of intermittent right sided chest pain and right sided upper back pain that is  "burning" in nature, nonexertional, non-radiating. The pain is worse with movement. It is now accompanied by 3 days of dysuria, increased urinary frequency, n/v, decreased po intake, diaphoresis, and neck pain. She does note one episode of small amount of hematemesis two days ago. Her appetite was normal one week ago.   She denies associated sob and ROS is negative for fevers, diarrhea, cough, hematuria, hematochezia. The chest pain is not worse with eating or lying flat.   She denies abdominal pain and states that her doctor noticed right sided abdominal swelling and instructed her to go the ED.   Meds: [name] does not know what medications she takes. The list from records faxed from PCP include: Bydureon for DM  hctz-olmesartan  25-40 mg amlodipine-atorvastatin 10 mg-40 mg Aspirin 81 mg     Allergies: Allergies as of 02/28/2018 - Review Complete 02/28/2018  Allergen Reaction Noted  . Bactrim [sulfamethoxazole-trimethoprim] Nausea And Vomiting and Other (See Comments) 05/13/2014  . Beef-derived products  02/28/2018  . Pork-derived products  02/28/2018  . Tuberculin ppd Other (See Comments) 03/11/2016  . Ace inhibitors Cough 01/15/2014   Past Medical History:  Diagnosis Date  . Allergy   . Anemia   . Anxiety   . Cellulitis and abscess of foot 05/13/2014   rt foot  . Diabetes mellitus   . Hypertension   . Positive TB test 2013    Family History: unknown. She does not know why her mom or dad passed away. She also had 3 children that passed away when they were young  Social History: Arabic speaking, married. Lives in an apartment with her husband. Moved to the [place] 8 years ago from [place]. Denies ever using tobacco or alcohol    Review of Systems: A complete ROS was negative except as per HPI.   Physical Exam: Blood pressure (!) 189/88, pulse 77, temperature 98.3 F (36.8 C), temperature source Oral, resp. rate 16, SpO2 96 %. Vitals:   03/01/18 0041 03/01/18 0045 03/01/18 0130 03/01/18 0200  BP: (!) 183/82 (!) 191/80 (!) 171/69 (!) 189/88  Pulse: 83 81  77  Resp:  16 16 17 16   Temp:      TempSrc:      SpO2:  97%  96%   General: Vital signs reviewed.  Patient is well-developed and well-nourished, in no acute distress and cooperative with exam.  Head: Normocephalic and atraumatic. Eyes: EOMI, conjunctivae normal, no scleral icterus.  Neck: Supple, trachea midline, normal ROM, no JVD, masses, LAD, thyromegaly, or carotid bruit present.  Breast: right upper inner breast is firm and tender to palpation, well healed scar in the midline of her chest but no other overlying skin changes of the breast, no nipple discharge, TTP in the right axilla without palpable lymphadenopathy. Left breast is normal.    Cardiovascular: RRR, S1 normal, S2 normal, no murmurs, gallops, or rubs. Pulmonary/Chest: Clear to auscultation bilaterally, no wheezes, rales, or rhonchi. Back: TTP over right upper back and midline thoracic spine around the level of T6. No CVA tenderness Abdominal: slightly TTP in RLQ with palpable mass/tight superficial tissue density measuring 2x2cm. Although, soft, non-distended, BS +, organomegaly, or guarding present.  Extremities: Bilateral amputations (right BKA and left AKA) non tender, non erythematous. No lower extremity edema bilaterally. No cyanosis or clubbing. Neurological: A&O x3, Strength is normal and symmetric bilaterally, cranial nerve II-XII are grossly intact, no focal motor deficit, sensory intact to light touch bilaterally.  Skin: Warm, dry and intact. No rashes or erythema. Psychiatric: Normal mood and affect. speech and behavior is normal. Cognition and memory are normal.    Labs: Na 136 K 3.0 Creatine 1.71 (baseline 0.65 six months ago) BUN 18 Albumin 3.0 Liver enzymes normal   WBC 7.0 Hemoglobin 11.3 MCV 81  Troponin 0.03 HCG slightly elevated at 6.1  EKG: personally reviewed my interpretation is NSR, no st or t wave elevations or depressions. No q waves  CXR: personally reviewed my interpretation is mild cardiomegaly, no pulmonary consolidations or effusions   CT a/p: 1. No acute process identified. 2. Fibrofatty plaque of the SMA approximately 6 cm downstream to the origin with moderate 50-70% stenosis. 3. L5-S1 grade 1 anterolisthesis with chronic L5 pars defects. 4. Aortic Atherosclerosis (ICD10-I70.0).  Assessment & Plan by Problem: Active Problems:   Hypertensive urgency  [name] is a 58 yo female with uncontrolled DM and poorly controlled PAD s/p right BKA and left AKA presenting with abdominal distension, intractable nausea and vomiting and RUQ pain. Dr. Jimmye Norman ordered a stat CT abdomen concerned about gallbladder disease vs SBO  vs pancreatitis. On further discussion with the patient, her symptoms seem to be more musculoskeletal in nature.    Abdominal pain: Broad differential. Patient denies abdominal pain but is noted to have tenderness to palpation of RLQ. I do not appreciate any distension and her abdomen is symmetrically obese. She does have a palpable superficial mass/area of thickened soft tissue in the RLQ but CT does not show anything that correlates with this. She does struggle with chronic constipation which could be contributing to abdominal pain. UTI is also in consideration given her dysuria, frequency, suprapubic tenderness. No CVA tenderness to suggest pyelonephritis. Nausea, vomiting and right shoulder pain could be indicative of gall bladder disease with referred right shoulder pain but t. Bili was normal and abdominal CT was negative for any acute etiology to explain her symptoms and labs are largely reassuring. Liver enzymes are WNLs. No evidence of systemic infection. ACS is in the differential but EKG and troponin are reassuring. Of note, her DM medication bydureon can cause her symptoms.  - supportive care  -  LR 125cc/hr for 20 hrs  - holding home bydureon  - phenergan prn nausea   HTN Urgency: On presentation, she was hypertensive to 225/83 which could also be contributing to her abdominal pain. Labs show an AKI. Trop and EKG reassuring (no prior LHC, echo and carotid dopplers two years ago reassuring). No encephalopathy. UA as not been collected. Per chart review, HTN and cholesterol remain uncontrolled despite multidrug regimen, suspicious for poor adherence. - s/p 10 mg IV hydralazine in the ED  - will give IV labetalol 5mg  once and continue to monitor BP - verify home medications in the morning, hopefully the husband can be of assistance  - start home amlodipine 5mg , and HCTZ 25mg  now. Will schedule irbesartan to start tomorrow morning. (olmesartan is her home ARB, but it is not on formulary) -  Goal BP overnight is SBP 160.  - f/u UA to assess for nephropathy (proteinuria and/or hematuria)  AKI: creatine 1.7 on admission, already improved to 1.5 after 1L IVFs. Baseline appears to be 0.65. Could be acutely worsening renal function in the setting of n/v and decreased po intake. Also could be some component of chronic kidney disease given uncontrolled diabetes and HTN. PCP has mad note of worsening renal function in the last few months.  - continue IVFs - repeat BMP tomorrow   Chest Pain: On review of records from PCP, patient has been complaining of similar right sided chest wall/back pain since December that is reproducible on exam. She had a mammogram and breast ultrasound in Dec 2019 which was benign, recommending 1 year f/u. She has been evaluated by PT/OT and her providers feel that this pain is most likely MSK in nature, secondary to muscle strain from overuse of manual wheelchair and manual transfers. She has been given ice packs, tramadol, and tylenol but symptoms seem to be persisting despite conservative therapy. Unclear if she's really tried to rest the arm. She has wanted to get an electric wheel chair in the past but hasn't been able to obtain one.  - tylenol prn  - Voltaren gel - heating pad  Hypokalemia: could be secondary to vomiting. s/p 42meq po K+ in the ED  - recheck tomorrow   DM: Home medications bydureon (once weekly injection). She has finally started to make improvement in her a1c after starting this medication. However, I am concerned it could be contributing to her abdominal pain, n/v. She cannot tolerate metformin, she had severe vomiting with use. Last a1c was 11.4 in June 2018 - lantus 10 units - novolog SSI tid wc - home gabapentin  - f/u outpatient   Stable Conditions: PAD:S/p bilateral AKA. Continue home plavix, aspirin Vitamin D insufficiency: Home medication vitamin D3 2000 intl units daily Major Depression: Stable per her last PCP visit. Continue  home citalopram 20 mg qd  Chronic normocytic anemia: hemoglobin is above baseline. Nothing to do   Diet: carb modified  DVT prophylaxis: subq heparin  Code: Full   Dispo: Admit patient to Observation with expected length of stay less than 2 midnights.  Signed: Isabelle Course, MD 03/01/2018, 2:23 AM  Pager: [phone]

## 2018-02-28 NOTE — ED Triage Notes (Signed)
Pt c/o abdominal pain, chest pain and back pain. Pt was sent from CT for further evaluation.

## 2018-02-28 NOTE — ED Provider Notes (Signed)
[facility] EMERGENCY DEPARTMENT Provider Note   CSN: [id number] Arrival date & time: 02/28/18  1823     History   Chief Complaint Chief Complaint  Patient presents with  . Chest Pain  . Abdominal Pain    HPI Kimberly Lane is a 58 y.o. female history of diabetes, hypertension, here presenting with abdominal pain, chest pain.  Patient has right-sided chest pain intermittently for the last month or so.  Patient was complaining of several days of abdominal pain that is worse on the right side.  Associated with several episodes of vomiting.  Patient went to internal medicine clinic and had a CT abdomen pelvis outpatient that showed some SMA stenosis but no obvious bowel ischemia or bowel obstruction.  Patient was sent here for further evaluation.  The history is provided by the patient.    Past Medical History:  Diagnosis Date  . Allergy   . Anemia   . Anxiety   . Cellulitis and abscess of foot 05/13/2014   rt foot  . Diabetes mellitus   . Hypertension   . Positive TB test 2013    Patient Active Problem List   Diagnosis Date Noted  . Hypertensive urgency 02/28/2018  . Neuropathy LLE 08/31/2016  . Left hip pain 08/31/2016  . Labile blood pressure   . Labile blood glucose   . Acute blood loss anemia   . Hypokalemia   . Benign essential HTN   . Uncontrolled diabetes mellitus type 2 with peripheral artery disease ([place])   . Acute diastolic congestive heart failure ([place]) 08/19/2016  . Unilateral AKA, left ([place]) 08/19/2016  . Post-operative pain   . Leukocytosis   . Anxiety state   . S/P AKA (above knee amputation), left ([place]) 08/17/2016  . Acute respiratory failure ([place])   . Dyspnea   . Gangrene due to peripheral vascular disease ([place]) 08/09/2016  . PAD (peripheral artery disease) ([place]) 03/11/2016  . Dysuria 10/24/2014  . Osteomyelitis of left foot ([employer])   . Cough   . Hx of right BKA ([place]) 05/26/2014  . S/P BKA (below knee amputation) unilateral, right  ([place])   . Abnormal EKG   . Preop cardiovascular exam 05/21/2014  . Cellulitis of toe of right foot   . Peripheral arterial disease ([place])   . Cellulitis 05/13/2014  . Type 2 diabetes mellitus with complication ([place])   . HLD (hyperlipidemia)   . Pure hypercholesterolemia 11/14/2013  . High risk social situation 11/13/2013  . Back pain without radiation 09/05/2011    Class: Acute  . Osteoarthritis of left hip 05/31/2011  . History of primary TB 04/26/2011  . DM (diabetes mellitus), type 2, uncontrolled ([place]) 05/17/2010  . Essential hypertension 12/09/2009    Past Surgical History:  Procedure Laterality Date  . ABDOMINAL AORTAGRAM N/A 05/20/2014   Procedure: ABDOMINAL Maxcine Ham;  Surgeon: Serafina Mitchell, MD;  Location: [facility] CATH LAB;  Service: Cardiovascular;  Laterality: N/A;  . AMPUTATION Right 05/23/2014   Procedure: AMPUTATION BELOW KNEE;  Surgeon: Mal Misty, MD;  Location: [facility];  Service: Vascular;  Laterality: Right;  . AMPUTATION Left 08/09/2016   Procedure: LEFT GREAT TOE AMPUTATION;  Surgeon: Waynetta Sandy, MD;  Location: [facility];  Service: Vascular;  Laterality: Left;  . AMPUTATION Left 08/15/2016   Procedure: AMPUTATION ABOVE KNEE;  Surgeon: Rosetta Posner, MD;  Location: [facility];  Service: Vascular;  Laterality: Left;  . PERIPHERAL VASCULAR CATHETERIZATION N/A 03/11/2016   Procedure: Abdominal Aortogram w/Lower Extremity;  Surgeon: Elam Dutch, MD;  Location: [facility] CV LAB;  Service: Cardiovascular;  Laterality: N/A;  . PERIPHERAL VASCULAR CATHETERIZATION Left 03/11/2016   Procedure: Peripheral Vascular Intervention;  Surgeon: Elam Dutch, MD;  Location: [facility] CV LAB;  Service: Cardiovascular;  Laterality: Left;  Superficial femoral     OB History   No obstetric history on file.      Home Medications    Prior to Admission medications   Medication Sig Start Date End Date Taking? Authorizing Provider  acetaminophen (TYLENOL) 650 MG CR tablet  Take 650 mg by mouth every 8 (eight) hours as needed for pain.    [provider]  Amino Acids-Protein Hydrolys (FEEDING SUPPLEMENT, PRO-STAT SUGAR FREE 64,) LIQD Take 30 mLs by mouth 2 (two) times daily. 09/01/16   Love, Ivan Anchors, PA-C  amLODipine-atorvastatin (CADUET) 10-40 MG tablet Take 1 tablet by mouth daily.    [provider]  aspirin 81 MG tablet Take 1 tablet (81 mg total) by mouth daily. 01/20/15   Haney, Amedeo Plenty, MD  chlorthalidone (HYGROTEN) 100 MG tablet Take 1 tablet (100 mg total) by mouth daily. 09/02/16   Love, Ivan Anchors, PA-C  citalopram (CELEXA) 20 MG tablet Take 1.5 tablets (30 mg total) by mouth daily. 09/01/16   Love, Ivan Anchors, PA-C  clopidogrel (PLAVIX) 75 MG tablet Take 1 tablet (75 mg total) by mouth daily with breakfast. 03/12/16   Rhyne, Hulen Shouts, PA-C  cyclobenzaprine (FLEXERIL) 5 MG tablet Take 1 tablet (5 mg total) by mouth 3 (three) times daily as needed for muscle spasms. 09/01/16   Love, Ivan Anchors, PA-C  gabapentin (NEURONTIN) 100 MG capsule Take 1 capsule (100 mg total) by mouth 3 (three) times daily. 09/01/16   Love, Ivan Anchors, PA-C  hydrALAZINE (APRESOLINE) 25 MG tablet Take 1 tablet (25 mg total) by mouth every 8 (eight) hours. 09/01/16   Love, Ivan Anchors, PA-C  insulin aspart (NOVOLOG) 100 UNIT/ML injection Inject 0-20 Units into the skin 3 (three) times daily with meals. 08/19/16   Ledell Noss, MD  insulin aspart (NOVOLOG) 100 UNIT/ML injection Inject 0-9 Units into the skin 3 (three) times daily with meals. 09/01/16   Love, Ivan Anchors, PA-C  insulin glargine (LANTUS) 100 UNIT/ML injection Inject 0.15 mLs (15 Units total) into the skin at bedtime. 09/01/16   Love, Ivan Anchors, PA-C  lisinopril (PRINIVIL,ZESTRIL) 40 MG tablet Take 40 mg by mouth daily.    [provider]  metFORMIN (GLUCOPHAGE) 500 MG tablet Take 1 tablet (500 mg total) by mouth 2 (two) times daily with a meal. 09/01/16   Love, Ivan Anchors, PA-C  methocarbamol (ROBAXIN) 500 MG tablet Take 1  tablet (500 mg total) by mouth 3 (three) times daily. 09/01/16   Love, Ivan Anchors, PA-C  Multiple Vitamin (MULTIVITAMIN WITH MINERALS) TABS tablet Take 1 tablet by mouth daily. 08/19/16   Ledell Noss, MD  pantoprazole (PROTONIX) 40 MG tablet Take 1 tablet (40 mg total) by mouth daily. 09/02/16   Love, Ivan Anchors, PA-C  polyethylene glycol (MIRALAX / GLYCOLAX) packet Take 17 g by mouth daily as needed for mild constipation. 09/01/16   Love, Ivan Anchors, PA-C  potassium chloride SA (K-DUR,KLOR-CON) 20 MEQ tablet Take 2 tablets (40 mEq total) by mouth 3 (three) times daily before meals. 09/01/16   Love, Ivan Anchors, PA-C  senna-docusate (SENOKOT-S) 8.6-50 MG tablet Take 1 tablet by mouth at bedtime. 09/01/16   Love, Ivan Anchors, PA-C  traMADol-acetaminophen (ULTRACET) 37.5-325 MG tablet  Take 1 tablet by mouth 4 (four) times daily -  with meals and at bedtime. 09/01/16   Bary Leriche, PA-C    Family History Family History  Problem Relation Age of Onset  . Heart disease Other        No family history    Social History Social History   Tobacco Use  . Smoking status: Never Smoker  . Smokeless tobacco: Never Used  Substance Use Topics  . Alcohol use: No  . Drug use: No     Allergies   Bactrim [sulfamethoxazole-trimethoprim]; Beef-derived products; Pork-derived products; Tuberculin ppd; and Ace inhibitors   Review of Systems Review of Systems  Cardiovascular: Positive for chest pain.  Gastrointestinal: Positive for abdominal pain.  All other systems reviewed and are negative.    Physical Exam Updated Vital Signs BP (!) 192/71   Pulse 83   Temp 98.3 F (36.8 C) (Oral)   Resp 17   SpO2 98%   Physical Exam Vitals signs and nursing note reviewed.  Constitutional:      Appearance: She is well-developed.     Comments: Uncomfortable, chronically ill   HENT:     Head: Normocephalic.  Eyes:     Pupils: Pupils are equal, round, and reactive to light.  Neck:     Musculoskeletal: Normal range of  motion and neck supple.  Cardiovascular:     Rate and Rhythm: Normal rate and regular rhythm.     Heart sounds: Normal heart sounds.  Pulmonary:     Effort: Pulmonary effort is normal.     Breath sounds: Normal breath sounds.  Abdominal:     Palpations: Abdomen is soft.     Comments: + RUQ tenderness   Musculoskeletal: Normal range of motion.     Comments: Bilateral AKA   Skin:    General: Skin is warm.     Capillary Refill: Capillary refill takes less than 2 seconds.  Neurological:     General: No focal deficit present.     Mental Status: She is alert and oriented to person, place, and time.  Psychiatric:        Mood and Affect: Mood normal.        Behavior: Behavior normal.      ED Treatments / Results  Labs (all labs ordered are listed, but only abnormal results are displayed) Labs Reviewed  COMPREHENSIVE METABOLIC PANEL - Abnormal; Notable for the following components:      Result Value   Potassium 3.0 (*)    Glucose, Bld 112 (*)    Creatinine, Ser 1.51 (*)    Albumin 3.0 (*)    GFR calc non Af Amer 37 (*)    GFR calc Af Amer 43 (*)    All other components within normal limits  CBC - Abnormal; Notable for the following components:   Hemoglobin 11.3 (*)    MCH 25.6 (*)    All other components within normal limits  I-STAT BETA HCG BLOOD, ED ([facility], [facility], [facility] ONLY) - Abnormal; Notable for the following components:   I-stat hCG, quantitative 6.1 (*)    All other components within normal limits  LIPASE, BLOOD  URINALYSIS, ROUTINE W REFLEX MICROSCOPIC  PREGNANCY, URINE  HIV ANTIBODY (ROUTINE TESTING W REFLEX)  BASIC METABOLIC PANEL  CBG MONITORING, ED  I-STAT TROPONIN, ED    EKG None  Radiology Ct Abdomen Pelvis W Contrast  Result Date: 02/28/2018 CLINICAL DATA:  58 y/o F; ongoing right upper chest pain, new onset abdominal  pain, nausea, and vomiting. EXAM: CT ABDOMEN AND PELVIS WITH CONTRAST TECHNIQUE: Multidetector CT imaging of the abdomen and pelvis was  performed using the standard protocol following bolus administration of intravenous contrast. CONTRAST:  75 cc Isovue-300 COMPARISON:  01/08/2010 abdominal ultrasound FINDINGS: Lower chest: No acute abnormality. Hepatobiliary: No focal liver abnormality is seen. No gallstones, gallbladder wall thickening, or biliary dilatation. Pancreas: Unremarkable. No pancreatic ductal dilatation or surrounding inflammatory changes. Spleen: Linear calcification at superior margin of spleen, likely chronic infarction. Adrenals/Urinary Tract: No adrenal hemorrhage or renal injury identified. Bladder is unremarkable. Stomach/Bowel: Stomach is within normal limits. Appendix appears normal. No evidence of bowel wall thickening, distention, or inflammatory changes. Vascular/Lymphatic: Aortic atherosclerosis. No enlarged abdominal or pelvic lymph nodes. Fibrofatty plaque of the SMA approximately 6 cm downstream to the origin with moderate 50-70% stenosis. Reproductive: Dorsal uterine fundus calcified fibroma measuring 17 mm. Uterus and bilateral adnexa are otherwise unremarkable. Other: No abdominal wall hernia or abnormality. No abdominopelvic ascites. Musculoskeletal: No fracture is seen. L5-S1 grade 1 anterolisthesis with chronic L5 pars defects. Mild spondylosis of the lumbar spine. No acute osseous abnormality identified. IMPRESSION: 1. No acute process identified. 2. Fibrofatty plaque of the SMA approximately 6 cm downstream to the origin with moderate 50-70% stenosis. 3. L5-S1 grade 1 anterolisthesis with chronic L5 pars defects. 4. Aortic Atherosclerosis (ICD10-I70.0). Electronically Signed   By: Kristine Garbe M.D.   On: 02/28/2018 18:37   Dg Chest Port 1 View  Result Date: 02/28/2018 CLINICAL DATA:  Chest pain. EXAM: PORTABLE CHEST 1 VIEW COMPARISON:  Lung bases from abdominal CT earlier this day. Chest radiograph 08/17/2016 FINDINGS: Mild cardiomegaly with normal mediastinal contours. Normal pulmonary  vasculature. No focal airspace disease, pleural effusion, or pneumothorax. No acute osseous abnormalities are seen. IMPRESSION: Mild cardiomegaly without congestive failure or acute findings. Electronically Signed   By: Keith Rake M.D.   On: 02/28/2018 21:05    Procedures Procedures (including critical care time)  Medications Ordered in ED Medications  heparin injection 5,000 Units (has no administration in time range)  acetaminophen (TYLENOL) tablet 650 mg (has no administration in time range)    Or  acetaminophen (TYLENOL) suppository 650 mg (has no administration in time range)  senna-docusate (Senokot-S) tablet 1 tablet (has no administration in time range)  promethazine (PHENERGAN) tablet 12.5 mg (has no administration in time range)  aspirin chewable tablet 81 mg (has no administration in time range)  citalopram (CELEXA) tablet 30 mg (has no administration in time range)  clopidogrel (PLAVIX) tablet 75 mg (has no administration in time range)  gabapentin (NEURONTIN) capsule 100 mg (has no administration in time range)  pantoprazole (PROTONIX) EC tablet 40 mg (has no administration in time range)  insulin aspart (novoLOG) injection 0-15 Units (has no administration in time range)  insulin glargine (LANTUS) injection 10 Units (has no administration in time range)  hydrALAZINE (APRESOLINE) tablet 25 mg (has no administration in time range)  amLODipine (NORVASC) tablet 5 mg (has no administration in time range)    And  atorvastatin (LIPITOR) tablet 40 mg (has no administration in time range)  chlorthalidone (HYGROTON) tablet 100 mg (has no administration in time range)  sodium chloride 0.9 % bolus 1,000 mL (0 mLs Intravenous Stopped 02/28/18 2246)  hydrALAZINE (APRESOLINE) injection 10 mg (10 mg Intravenous Given 02/28/18 2059)  ondansetron (ZOFRAN) injection 4 mg (4 mg Intravenous Given 02/28/18 2059)  potassium chloride SA (K-DUR,KLOR-CON) CR tablet 40 mEq (40 mEq Oral Given 02/28/18  2059)  Initial Impression / Assessment and Plan / ED Course  I have reviewed the triage vital signs and the nursing notes.  Pertinent labs & imaging results that were available during my care of the patient were reviewed by me and considered in my medical decision making (see chart for details).    Marnesha Gagen is a 58 y.o. female here with abdominal pain, chest pain. Patient hypertensive 225/83 on arrival. CT today showed SMA stenosis but no obvious ischemic bowel. Patient has persistent pain and I think likely has hypertensive urgency. Also has AKI and hypokalemia. Given IVF, hydralazine. Will admit to internal medicine teaching service for hypertensive urgency.    Final Clinical Impressions(s) / ED Diagnoses   Final diagnoses:  Hypertensive urgency  AKI (acute kidney injury) [facility])  Hypokalemia    ED Discharge Orders    None       Drenda Freeze, MD 02/28/18 2324

## 2018-03-01 DIAGNOSIS — R1031 Right lower quadrant pain: Secondary | ICD-10-CM

## 2018-03-01 DIAGNOSIS — D649 Anemia, unspecified: Secondary | ICD-10-CM

## 2018-03-01 DIAGNOSIS — Z993 Dependence on wheelchair: Secondary | ICD-10-CM

## 2018-03-01 DIAGNOSIS — M546 Pain in thoracic spine: Secondary | ICD-10-CM

## 2018-03-01 DIAGNOSIS — Z89511 Acquired absence of right leg below knee: Secondary | ICD-10-CM

## 2018-03-01 DIAGNOSIS — R350 Frequency of micturition: Secondary | ICD-10-CM

## 2018-03-01 DIAGNOSIS — F339 Major depressive disorder, recurrent, unspecified: Secondary | ICD-10-CM

## 2018-03-01 DIAGNOSIS — I160 Hypertensive urgency: Principal | ICD-10-CM

## 2018-03-01 DIAGNOSIS — Z89612 Acquired absence of left leg above knee: Secondary | ICD-10-CM

## 2018-03-01 DIAGNOSIS — E559 Vitamin D deficiency, unspecified: Secondary | ICD-10-CM

## 2018-03-01 DIAGNOSIS — Z881 Allergy status to other antibiotic agents status: Secondary | ICD-10-CM

## 2018-03-01 DIAGNOSIS — Z888 Allergy status to other drugs, medicaments and biological substances status: Secondary | ICD-10-CM

## 2018-03-01 DIAGNOSIS — R300 Dysuria: Secondary | ICD-10-CM | POA: Diagnosis not present

## 2018-03-01 DIAGNOSIS — E1151 Type 2 diabetes mellitus with diabetic peripheral angiopathy without gangrene: Secondary | ICD-10-CM

## 2018-03-01 DIAGNOSIS — Z91018 Allergy to other foods: Secondary | ICD-10-CM

## 2018-03-01 DIAGNOSIS — R1011 Right upper quadrant pain: Secondary | ICD-10-CM | POA: Insufficient documentation

## 2018-03-01 DIAGNOSIS — R0789 Other chest pain: Secondary | ICD-10-CM

## 2018-03-01 DIAGNOSIS — E876 Hypokalemia: Secondary | ICD-10-CM

## 2018-03-01 DIAGNOSIS — Z7902 Long term (current) use of antithrombotics/antiplatelets: Secondary | ICD-10-CM

## 2018-03-01 DIAGNOSIS — I10 Essential (primary) hypertension: Secondary | ICD-10-CM

## 2018-03-01 DIAGNOSIS — Z79899 Other long term (current) drug therapy: Secondary | ICD-10-CM

## 2018-03-01 DIAGNOSIS — N179 Acute kidney failure, unspecified: Secondary | ICD-10-CM

## 2018-03-01 DIAGNOSIS — Z887 Allergy status to serum and vaccine status: Secondary | ICD-10-CM

## 2018-03-01 DIAGNOSIS — Z7982 Long term (current) use of aspirin: Secondary | ICD-10-CM

## 2018-03-01 DIAGNOSIS — Z794 Long term (current) use of insulin: Secondary | ICD-10-CM

## 2018-03-01 LAB — URINALYSIS, ROUTINE W REFLEX MICROSCOPIC
Bilirubin Urine: NEGATIVE
Glucose, UA: 150 mg/dL — AB
Hgb urine dipstick: NEGATIVE
KETONES UR: NEGATIVE mg/dL
Leukocytes, UA: NEGATIVE
Nitrite: NEGATIVE
Protein, ur: 100 mg/dL — AB
Specific Gravity, Urine: 1.019 (ref 1.005–1.030)
pH: 7 (ref 5.0–8.0)

## 2018-03-01 LAB — BASIC METABOLIC PANEL
Anion gap: 9 (ref 5–15)
BUN: 17 mg/dL (ref 6–20)
CALCIUM: 8.4 mg/dL — AB (ref 8.9–10.3)
CO2: 26 mmol/L (ref 22–32)
CREATININE: 1.7 mg/dL — AB (ref 0.44–1.00)
Chloride: 103 mmol/L (ref 98–111)
GFR calc Af Amer: 38 mL/min — ABNORMAL LOW (ref >60)
GFR calc non Af Amer: 32 mL/min — ABNORMAL LOW (ref >60)
Glucose, Bld: 234 mg/dL — ABNORMAL HIGH (ref 70–99)
Potassium: 3.1 mmol/L — ABNORMAL LOW (ref 3.5–5.1)
Sodium: 138 mmol/L (ref 135–145)

## 2018-03-01 LAB — CBG MONITORING, ED
Glucose-Capillary: 217 mg/dL — ABNORMAL HIGH (ref 70–99)
Glucose-Capillary: 141 mg/dL — ABNORMAL HIGH (ref 70–99)
Glucose-Capillary: 225 mg/dL — ABNORMAL HIGH (ref 70–99)

## 2018-03-01 LAB — PREGNANCY, URINE: Preg Test, Ur: NEGATIVE

## 2018-03-01 LAB — HIV ANTIBODY (ROUTINE TESTING W REFLEX): HIV Screen 4th Generation wRfx: NONREACTIVE

## 2018-03-01 MED ORDER — IRBESARTAN 300 MG PO TABS
300.0000 mg | ORAL_TABLET | Freq: Every day | ORAL | Status: DC
  Administered 2018-03-01: 300 mg via ORAL
  Filled 2018-03-01: qty 1

## 2018-03-01 MED ORDER — IBUPROFEN 400 MG PO TABS: 600.0000 mg | ORAL_TABLET | Freq: Once | ORAL | Status: DC

## 2018-03-01 MED ORDER — OLMESARTAN MEDOXOMIL-HCTZ 40-25 MG PO TABS: 1.0000 | ORAL_TABLET | Freq: Every day | ORAL | 0 refills | Status: DC

## 2018-03-01 MED ORDER — AMLODIPINE BESYLATE 5 MG PO TABS: 10.0000 mg | ORAL_TABLET | ORAL | Status: DC

## 2018-03-01 MED ORDER — HYDROCHLOROTHIAZIDE 25 MG PO TABS
25.0000 mg | ORAL_TABLET | ORAL | Status: DC
  Administered 2018-03-01: 25 mg via ORAL
  Filled 2018-03-01: qty 1

## 2018-03-01 MED ORDER — DICLOFENAC SODIUM 1 % TD GEL: 2.0000 g | Freq: Two times a day (BID) | TRANSDERMAL | 0 refills | Status: DC | PRN

## 2018-03-01 MED ORDER — HYDROCHLOROTHIAZIDE 25 MG PO TABS: 25.0000 mg | ORAL_TABLET | Freq: Every day | ORAL | Status: DC

## 2018-03-01 MED ORDER — LABETALOL HCL 5 MG/ML IV SOLN: 5.0000 mg | Freq: Once | INTRAVENOUS | Status: DC

## 2018-03-01 MED ORDER — ATORVASTATIN CALCIUM 40 MG PO TABS: 40.0000 mg | ORAL_TABLET | Freq: Every day | ORAL | Status: DC

## 2018-03-01 MED ORDER — AMLODIPINE BESYLATE 5 MG PO TABS
10.0000 mg | ORAL_TABLET | ORAL | Status: DC
  Administered 2018-03-01: 10 mg via ORAL
  Filled 2018-03-01: qty 2

## 2018-03-01 MED ORDER — DICLOFENAC SODIUM 1 % TD GEL: 2.0000 g | Freq: Two times a day (BID) | TRANSDERMAL | Status: DC | PRN

## 2018-03-01 NOTE — ED Notes (Signed)
Ordered a carb modified breakfast--Kimberly Lane  

## 2018-03-01 NOTE — ED Notes (Signed)
Spoke with benita [facility] ; will arrange transport for patient at this time ; pt will be waiting in waiting room

## 2018-03-01 NOTE — Progress Notes (Signed)
Subjective: She complains today of the chest and back pain. She states that it is very tender to palpation and is what she is most concerned about today. She seems somewhat confused that she went to the PCP to get medicine for her chest pain and they sent her to the ED for abdominal pain. She does state that she still has some lower abdominal pain but did not seem that concerned about it this morning. We informed her of the CT findings of stenosis in SMA and she stated that she understood and did not relay any further questions.   Objective: Vital signs in last 24 hours: Vitals:   03/01/18 0700 03/01/18 0800 03/01/18 0915 03/01/18 1147  BP: (!) 164/77 (!) 165/84 (!) 154/82 (!) 175/65  Pulse: 72 67 73 70  Resp: 17 11 16 18   Temp:      TempSrc:      SpO2: 96% 98% 97% 98%   General: Vital signs reviewed.  Patient is well-developed and well-nourished, in no acute distress and cooperative with exam.  Head: Normocephalic and atraumatic.  Breast: right upper inner breast is firm and tender to palpation, well healed scar in the midline of her chest but no other overlying skin changes of the breast, no nipple discharge, TTP in the right axilla without palpable lymphadenopathy. Left breast is normal.  Cardiovascular: RRR, S1 normal, S2 normal, no murmurs, gallops, or rubs. Pulmonary/Chest: Clear to auscultation bilaterally, no wheezes, rales, or rhonchi. Back: TTP over right upper back and midline thoracic spine around the level of T6. No CVA tenderness Abdominal: slightly TTP in Bilateral lower quadrants with palpable mass/tight superficial tissue density measuring 2x2cm. Although, soft, non-distended, BS +, organomegaly, or guarding present.  Extremities: Bilateral amputations (right BKA and left AKA) non tender, non erythematous. No lower extremity edema bilaterally. No cyanosis or clubbing.  Lab Results: UA positive for 150 of glucose, 100 of protein, and rare bacteria Lipase negative Troponin  negative CBGs elevated to 225 on most recent testing  Assessment/Plan: Active Problems:   Hypertensive urgency Abdominal Pain Acute Kidney Injury Type 2 Diabetes Mellitus with hyperglycemia Chest pain of MSK origin  [name] is a 58 yo female with uncontrolled DM and poorly controlled PAD s/p right BKA and left AKA presenting with abdominal distension, intractable nausea and vomiting and RUQ pain as well as hypertensive emergency. CT scan indicative of SMA stenosis but ruled out any acute etiology.  Abdominal pain: She complains of lower abdominal pain in bilateral lower quadrants today. No masses or distension palpated. No rebound or guarding noted today. UA negative for signs of infection. She seems to be resting comfortably in bed and tolerating food now. Most likely cause of pain is stenosis of SMA as complete workup as been relatively benign. Pain could also have been worsened but hypertensive crisis on admission that has now better controlled. Pt is comfortable and with [facility] follow-up she can go home today.  HTN Urgency: On presentation, she was hypertensive to 225/83. After speaking with Dr. Jimmye Norman (PCP [facility]), it seems as though Ms. Mcqueary is very non-compliant with her antihypertensives. She was given hydralazine, HCTZ, and irbesartan with improvement in her blood pressures to SBP 160s-170s. She will discharged on her home amplodipine-atorvastatin and HCTZ-olmesartan.   AKI: creatine 1.7 on admission, improved slightly but currently Cr remains at 1.7, will cont IVFs. Baseline appears to be 0.65 but has not been checked in over a year. Could be acutely worsening renal function in the setting  hypertensive emergency, n/v, and decreased PO intake. But kidney damage most likely from uncontrolled hypertension and diabetes. She was given IVFs. She will need a repeat BMP at hospital follow-up.  Chest Pain: Chest pain and back pain reproducible on exam today and remains tender to  palpation. Muscles feel tight around scapula and chest. ACS workup was negative leading to most likey MSK origin. Gave tylenol for pain and she stated that she was feeling a little better. Will refrain from using NSAIDs given her ongoing AKI. She was given voltaren gel and tylenol with improvement in her pain. She can continue tylenol PRN at home.   Hypokalemia: could be secondary to vomiting. s/p 76meq po K+ in the ED. Will need repeat BMP at hospital follow up.  Type 2 DM: Home medications bydureon (once weekly injection). Bydureon has been held throughout admission. She will need to follow-up with PCP to determine whether this medication should be restarted.   Stable Conditions: PAD:S/p bilateral AKA. Continue home plavix, aspirin Vitamin D insufficiency: Home medication vitamin D3 2000 intl units daily Major Depression: Stable per her last PCP visit. Continue home citalopram 20 mg qd  Chronic normocytic anemia: hemoglobin is above baseline.   This is a Careers information officer Note.  The care of the patient was discussed with Dr. Alfonse Spruce and Heber Kingsley and the assessment and plan formulated with their assistance.  Please see their attached note for official documentation of the daily encounter.   LOS: 0 days   Synetta Shadow, Medical Student 03/01/2018, 12:33 PM   Attestation for Student Documentation:  I personally was present and performed or re-performed the history, physical exam and medical decision-making activities of this service and have verified that the service and findings are accurately documented in the student's note.  Carroll Sage, MD 03/01/2018, 3:25 PM

## 2018-03-01 NOTE — ED Notes (Signed)
Pl per dr Alfonse Spruce to discharge with bp of 170/66

## 2018-03-01 NOTE — Discharge Instructions (Signed)
Thank you so much for allowing [place] to care for you during your hospitalization. You were found to have chest pain which is likely to be due to muscle aches. Please take acetaminophen 1 tablet 3 times a day to try and help with your pain. Dr. Jimmye Norman with [facility] will follow-up with you for any other concerns.

## 2018-03-02 NOTE — Discharge Summary (Signed)
Name: Kimberly Lane MRN: [id number] DOB: 1960/09/11 58 y.o. PCP: Angelica Pou, MD  Date of Admission: 02/28/2018  6:25 PM Date of Discharge: 03/01/2018 Attending Physician: Dr. Joni Reining MD  Discharge Diagnosis: 1. Hypertensive Urgency 2. Abdominal Pain 3. Musculoskeletal Chest Pain 4. Acute vs Chronic Kidney Injury 5. Type 2 Diabetes Mellitus 6. Hypokalemia  Discharge Medications: Allergies as of 03/01/2018      Reactions   Bactrim [sulfamethoxazole-trimethoprim] Nausea And Vomiting, Other (See Comments)   Pt and family verified that pt takes this medication   Beef-derived Products    Pork-derived Products    Tuberculin Ppd Other (See Comments)   Unknown   Ace Inhibitors Cough   cough      Medication List    STOP taking these medications   chlorthalidone 100 MG tablet Commonly known as:  HYGROTEN   hydrALAZINE 25 MG tablet Commonly known as:  APRESOLINE   lisinopril 40 MG tablet Commonly known as:  PRINIVIL,ZESTRIL   traMADol-acetaminophen 37.5-325 MG tablet Commonly known as:  ULTRACET     TAKE these medications   acetaminophen 650 MG CR tablet Commonly known as:  TYLENOL Take 650 mg by mouth every 8 (eight) hours as needed for pain.   amLODipine-atorvastatin 10-40 MG tablet Commonly known as:  CADUET Take 1 tablet by mouth daily.   aspirin 81 MG tablet Take 1 tablet (81 mg total) by mouth daily.   citalopram 20 MG tablet Commonly known as:  CELEXA Take 1.5 tablets (30 mg total) by mouth daily.   clopidogrel 75 MG tablet Commonly known as:  PLAVIX Take 1 tablet (75 mg total) by mouth daily with breakfast.   cyclobenzaprine 5 MG tablet Commonly known as:  FLEXERIL Take 1 tablet (5 mg total) by mouth 3 (three) times daily as needed for muscle spasms.   diclofenac sodium 1 % Gel Commonly known as:  VOLTAREN Apply 2 g topically 2 (two) times daily as needed (right chest pain).   feeding supplement (PRO-STAT SUGAR FREE 64) Liqd Take 30 mLs  by mouth 2 (two) times daily.   gabapentin 100 MG capsule Commonly known as:  NEURONTIN Take 1 capsule (100 mg total) by mouth 3 (three) times daily.   insulin aspart 100 UNIT/ML injection Commonly known as:  novoLOG Inject 0-20 Units into the skin 3 (three) times daily with meals.   insulin aspart 100 UNIT/ML injection Commonly known as:  novoLOG Inject 0-9 Units into the skin 3 (three) times daily with meals.   insulin glargine 100 UNIT/ML injection Commonly known as:  LANTUS Inject 0.15 mLs (15 Units total) into the skin at bedtime.   metFORMIN 500 MG tablet Commonly known as:  GLUCOPHAGE Take 1 tablet (500 mg total) by mouth 2 (two) times daily with a meal.   methocarbamol 500 MG tablet Commonly known as:  ROBAXIN Take 1 tablet (500 mg total) by mouth 3 (three) times daily.   multivitamin with minerals Tabs tablet Take 1 tablet by mouth daily.   olmesartan-hydrochlorothiazide 40-25 MG tablet Commonly known as:  BENICAR HCT Take 1 tablet by mouth daily.   pantoprazole 40 MG tablet Commonly known as:  PROTONIX Take 1 tablet (40 mg total) by mouth daily.   polyethylene glycol packet Commonly known as:  MIRALAX / GLYCOLAX Take 17 g by mouth daily as needed for mild constipation.   potassium chloride SA 20 MEQ tablet Commonly known as:  K-DUR,KLOR-CON Take 2 tablets (40 mEq total) by mouth 3 (three) times daily before  meals.   senna-docusate 8.6-50 MG tablet Commonly known as:  Senokot-S Take 1 tablet by mouth at bedtime.       Disposition and follow-up:   [name] was discharged from [facility] in Stable condition.  At the hospital follow up visit please address:  1. Hypertension: monitor for decreased BP, worsening glucosuria and proteinuria, and verify she is taking her BP medications.  2. Abdominal Pain: monitor for increased pain, or changes in abdominal exam, follow up with medications as cause of n/v.  3. Chest pain: monitor for  signs of worsening chest pain and need for other non-steroidal medications.   4.  Labs / imaging needed at time of follow-up: repeat UA and BMP   5.  Pending labs/ test needing follow-up: none  Hospital Course by problem list: 1. Hypertensive Urgency: On presentation to the ED, she was hypertensive to 225/83 most likely in the setting of non-compliance with home BP medications. She denies headaches, vision changes, and did not have any evidence of end-organ damage. She was given IV Hydralazine, IV Labetalol, and restarted on her home doses of Amlodipine 5 mg, HCTZ 25 mg and irbesartan with improved BP. Prior to discharge, her SBPs decreased to the 160s-170s. Upon discharge it was recommended that she restart her home doses of amlodipine-atorvastatin and HCTZ-olmesartan to optimize blood pressure control.   2. Abdominal Pain: She was originally sent to the ED by her PCP for concerns of nausea and vomiting, abdominal distension/tenderness, tympanicity, and hypoactive bowel sounds on examination with concern for gallbladder disease vs SBO vs pancreatitis. On presentation to the ED, she still complained of lower abdominal pain but was resting comfortably with no masses, distension, rebound or guarding on exam. On further workup, CT scan showed SMA with 50-70% stenosis without signs of any acute process. This is the most likely etiology of pain but n/v could be side effect of Bydureon (held during admission). She was given phenergan and IVFs with resolution of n/v resolved. On discharge, she was resting comfortably and pain had improved.  3. Chest Pain of Musculoskeletal origin: On admission she presented with reported chest wall and back pain. Upon further chart review this was chronic pain that had been worked up extensively by her outpatient provider. On exam, tenderness was noted along rib cage, paraspinal muscles at level of T6 vertebrae, right breast and axilla. There was firmness of right breast with no  overlying skin changes, lymphadenopathy, or nipple discharge noted. Muscles were tight to palpation leading to muscle strain being the most likely cause of the pain. Cardiac ischemia work up was negative with normal ECG and tropnonins ruling out ACS causes of chest pain. She was given tylenol, volateren gel, and heating pad. On discharge pain was stable. Recommend she follow-up outpatient for further workup by her PCP.   4. Acute vs Chronic Kidney Injury: Creatinine elevated to 1.70 (baseline 1 year ago 0.65). Is likely due to uncontrolled HTN. BP medications restarted. She will need to have a repeat BMP at hospital follow-up.  5. Type 2 Diabetes Mellitus: Home medications bydureon (once weekly injection). She has finally started to make improvement in her a1c after starting this medication. However, we are oncerned it could be contributing to her abdominal pain, n/v. She cannot tolerate metformin, she had severe vomiting with use. Please reassess her need for this medication.  6. Hypokalemia: Likely due to vomiting. She was given LR and K supplementation. Please recheck BMP at hospital follow-up.  Discharge Vitals:  BP (!) 170/66   Pulse 68   Temp 98.3 F (36.8 C) (Oral)   Resp 18   SpO2 98%   Pertinent Labs, Studies, and Procedures:  CT abdomen: no acute process, SMA stenosis of 50-70%, and aortic atherosclerosis.  Lipase wnl UA positive for protein and glucose Cr elevated at 1.7 Troponin 0.3  Discharge Instructions: Discharge Instructions    Diet - low sodium heart healthy   Complete by:  As directed    Diet - low sodium heart healthy   Complete by:  As directed    Discharge instructions   Complete by:  As directed    Thank you so much for allowing [place] to care for you during your hospitalization.  Chest pain is most likely due to muscle pain   Discharge instructions   Complete by:  As directed    Thank you so much for allowing [place] to care for you during your hospitalization.  You  were found to have chest pain which is likely to be due to muscle aches.  Please take acetaminophen 1 tablet 3 times a day to try and help with your pain.  Dr. Jimmye Norman with [facility] will follow-up with you for any other concerns.   Increase activity slowly   Complete by:  As directed    Increase activity slowly   Complete by:  As directed       Signed: Synetta Shadow, Medical Student 03/02/2018, 2:30 PM    Attestation for Student Documentation:  I personally was present and performed or re-performed the history, physical exam and medical decision-making activities of this service and have verified that the service and findings are accurately documented in the student's note.  Carroll Sage, MD 03/03/2018, 12:54 PM

## 2018-04-05 ENCOUNTER — Other Ambulatory Visit (HOSPITAL_COMMUNITY): Payer: Self-pay | Admitting: Nurse Practitioner

## 2018-04-05 ENCOUNTER — Other Ambulatory Visit (HOSPITAL_COMMUNITY): Payer: Self-pay | Admitting: Family Medicine

## 2018-04-05 ENCOUNTER — Other Ambulatory Visit: Payer: Self-pay | Admitting: Family Medicine

## 2018-04-05 DIAGNOSIS — E785 Hyperlipidemia, unspecified: Secondary | ICD-10-CM

## 2018-04-05 DIAGNOSIS — I10 Essential (primary) hypertension: Secondary | ICD-10-CM

## 2018-04-06 ENCOUNTER — Ambulatory Visit (HOSPITAL_COMMUNITY): Payer: PRIVATE HEALTH INSURANCE

## 2018-04-11 ENCOUNTER — Ambulatory Visit (HOSPITAL_COMMUNITY)
Admission: RE | Admit: 2018-04-11 | Discharge: 2018-04-11 | Disposition: A | Discharge status: Home / Self Care | Payer: Medicare (Managed Care) | Source: Ambulatory Visit | Attending: Family Medicine | Admitting: Family Medicine

## 2018-04-11 DIAGNOSIS — I10 Essential (primary) hypertension: Secondary | ICD-10-CM | POA: Diagnosis not present

## 2018-04-11 NOTE — Progress Notes (Signed)
Renal artery duplex exam completed. Please see preliminary notes on CV PROC under chart review. Aileana Hodder H Malu Pellegrini(RDMS RVT) 04/11/18 11:03 AM

## 2018-07-05 ENCOUNTER — Inpatient Hospital Stay (HOSPITAL_COMMUNITY)
Admission: EM | Admit: 2018-07-05 | Discharge: 2018-07-10 | DRG: 177 | Disposition: A | Discharge status: Home / Self Care | Payer: Medicare (Managed Care) | Attending: Family Medicine | Admitting: Family Medicine

## 2018-07-05 ENCOUNTER — Encounter (HOSPITAL_COMMUNITY): Payer: Self-pay

## 2018-07-05 ENCOUNTER — Emergency Department (HOSPITAL_COMMUNITY): Payer: Medicare (Managed Care)

## 2018-07-05 ENCOUNTER — Other Ambulatory Visit: Payer: Self-pay

## 2018-07-05 DIAGNOSIS — Z89511 Acquired absence of right leg below knee: Secondary | ICD-10-CM

## 2018-07-05 DIAGNOSIS — K3184 Gastroparesis: Secondary | ICD-10-CM | POA: Diagnosis present

## 2018-07-05 DIAGNOSIS — U071 COVID-19: Principal | ICD-10-CM | POA: Diagnosis present

## 2018-07-05 DIAGNOSIS — N183 Chronic kidney disease, stage 3 (moderate): Secondary | ICD-10-CM | POA: Diagnosis present

## 2018-07-05 DIAGNOSIS — R112 Nausea with vomiting, unspecified: Secondary | ICD-10-CM | POA: Diagnosis not present

## 2018-07-05 DIAGNOSIS — E785 Hyperlipidemia, unspecified: Secondary | ICD-10-CM | POA: Diagnosis present

## 2018-07-05 DIAGNOSIS — I739 Peripheral vascular disease, unspecified: Secondary | ICD-10-CM | POA: Diagnosis not present

## 2018-07-05 DIAGNOSIS — I1310 Hypertensive heart and chronic kidney disease without heart failure, with stage 1 through stage 4 chronic kidney disease, or unspecified chronic kidney disease: Secondary | ICD-10-CM | POA: Diagnosis present

## 2018-07-05 DIAGNOSIS — E1165 Type 2 diabetes mellitus with hyperglycemia: Secondary | ICD-10-CM | POA: Diagnosis present

## 2018-07-05 DIAGNOSIS — Z888 Allergy status to other drugs, medicaments and biological substances status: Secondary | ICD-10-CM

## 2018-07-05 DIAGNOSIS — D631 Anemia in chronic kidney disease: Secondary | ICD-10-CM | POA: Diagnosis present

## 2018-07-05 DIAGNOSIS — E876 Hypokalemia: Secondary | ICD-10-CM | POA: Diagnosis not present

## 2018-07-05 DIAGNOSIS — Z9114 Patient's other noncompliance with medication regimen: Secondary | ICD-10-CM

## 2018-07-05 DIAGNOSIS — N179 Acute kidney failure, unspecified: Secondary | ICD-10-CM | POA: Diagnosis present

## 2018-07-05 DIAGNOSIS — Z89612 Acquired absence of left leg above knee: Secondary | ICD-10-CM

## 2018-07-05 DIAGNOSIS — Z882 Allergy status to sulfonamides status: Secondary | ICD-10-CM

## 2018-07-05 DIAGNOSIS — E1151 Type 2 diabetes mellitus with diabetic peripheral angiopathy without gangrene: Secondary | ICD-10-CM | POA: Diagnosis present

## 2018-07-05 DIAGNOSIS — I959 Hypotension, unspecified: Secondary | ICD-10-CM | POA: Diagnosis present

## 2018-07-05 DIAGNOSIS — E1143 Type 2 diabetes mellitus with diabetic autonomic (poly)neuropathy: Secondary | ICD-10-CM | POA: Diagnosis present

## 2018-07-05 DIAGNOSIS — J9601 Acute respiratory failure with hypoxia: Secondary | ICD-10-CM | POA: Diagnosis present

## 2018-07-05 DIAGNOSIS — E1122 Type 2 diabetes mellitus with diabetic chronic kidney disease: Secondary | ICD-10-CM | POA: Diagnosis present

## 2018-07-05 DIAGNOSIS — J069 Acute upper respiratory infection, unspecified: Secondary | ICD-10-CM | POA: Diagnosis not present

## 2018-07-05 DIAGNOSIS — Z915 Personal history of self-harm: Secondary | ICD-10-CM

## 2018-07-05 DIAGNOSIS — J988 Other specified respiratory disorders: Secondary | ICD-10-CM | POA: Diagnosis not present

## 2018-07-05 DIAGNOSIS — R509 Fever, unspecified: Secondary | ICD-10-CM | POA: Diagnosis not present

## 2018-07-05 DIAGNOSIS — R739 Hyperglycemia, unspecified: Secondary | ICD-10-CM

## 2018-07-05 HISTORY — DX: Peripheral vascular disease, unspecified: I73.9

## 2018-07-05 HISTORY — DX: Type 2 diabetes mellitus with diabetic peripheral angiopathy without gangrene: E11.51

## 2018-07-05 LAB — TRIGLYCERIDES: Triglycerides: 195 mg/dL — ABNORMAL HIGH (ref <150)

## 2018-07-05 LAB — CBC WITH DIFFERENTIAL/PLATELET
Abs Immature Granulocytes: 0.08 10*3/uL — ABNORMAL HIGH (ref 0.00–0.07)
Basophils Absolute: 0 10*3/uL (ref 0.0–0.1)
Basophils Relative: 0 %
Eosinophils Absolute: 0 10*3/uL (ref 0.0–0.5)
Eosinophils Relative: 0 %
HCT: 30.7 % — ABNORMAL LOW (ref 36.0–46.0)
Hemoglobin: 9.4 g/dL — ABNORMAL LOW (ref 12.0–15.0)
Immature Granulocytes: 1 %
Lymphocytes Relative: 37 %
Lymphs Abs: 2.5 10*3/uL (ref 0.7–4.0)
MCH: 26.1 pg (ref 26.0–34.0)
MCHC: 30.6 g/dL (ref 30.0–36.0)
MCV: 85.3 fL (ref 80.0–100.0)
Monocytes Absolute: 0.4 10*3/uL (ref 0.1–1.0)
Monocytes Relative: 6 %
Neutro Abs: 3.8 10*3/uL (ref 1.7–7.7)
Neutrophils Relative %: 56 %
Platelets: 226 10*3/uL (ref 150–400)
RBC: 3.6 MIL/uL — ABNORMAL LOW (ref 3.87–5.11)
RDW: 14.2 % (ref 11.5–15.5)
WBC: 6.4 10*3/uL (ref 4.0–10.5)
nRBC: 0 % (ref 0.0–0.2)

## 2018-07-05 LAB — COMPREHENSIVE METABOLIC PANEL
ALT: 24 U/L (ref 0–44)
AST: 52 U/L — ABNORMAL HIGH (ref 15–41)
Albumin: 2.1 g/dL — ABNORMAL LOW (ref 3.5–5.0)
Alkaline Phosphatase: 63 U/L (ref 38–126)
Anion gap: 12 (ref 5–15)
BUN: 45 mg/dL — ABNORMAL HIGH (ref 6–20)
CO2: 22 mmol/L (ref 22–32)
Calcium: 8.2 mg/dL — ABNORMAL LOW (ref 8.9–10.3)
Chloride: 102 mmol/L (ref 98–111)
Creatinine, Ser: 3.93 mg/dL — ABNORMAL HIGH (ref 0.44–1.00)
GFR calc Af Amer: 14 mL/min — ABNORMAL LOW (ref >60)
GFR calc non Af Amer: 12 mL/min — ABNORMAL LOW (ref >60)
Glucose, Bld: 321 mg/dL — ABNORMAL HIGH (ref 70–99)
Potassium: 4.1 mmol/L (ref 3.5–5.1)
Sodium: 136 mmol/L (ref 135–145)
Total Bilirubin: 0.3 mg/dL (ref 0.3–1.2)
Total Protein: 7.1 g/dL (ref 6.5–8.1)

## 2018-07-05 LAB — D-DIMER, QUANTITATIVE: D-Dimer, Quant: 2.84 ug/mL-FEU — ABNORMAL HIGH (ref 0.00–0.50)

## 2018-07-05 LAB — PROCALCITONIN: Procalcitonin: 0.37 ng/mL

## 2018-07-05 LAB — I-STAT BETA HCG BLOOD, ED (MC, WL, AP ONLY): I-stat hCG, quantitative: <5 m[IU]/mL (ref <5)

## 2018-07-05 LAB — LACTIC ACID, PLASMA: Lactic Acid, Venous: 1.2 mmol/L (ref 0.5–1.9)

## 2018-07-05 LAB — HEMOGLOBIN A1C
Hgb A1c MFr Bld: 13.5 % — ABNORMAL HIGH (ref 4.8–5.6)
Mean Plasma Glucose: 340.75 mg/dL

## 2018-07-05 LAB — GLUCOSE, CAPILLARY
Glucose-Capillary: 220 mg/dL — ABNORMAL HIGH (ref 70–99)
Glucose-Capillary: 98 mg/dL (ref 70–99)

## 2018-07-05 LAB — LACTATE DEHYDROGENASE: LDH: 417 U/L — ABNORMAL HIGH (ref 98–192)

## 2018-07-05 LAB — SARS CORONAVIRUS 2 BY RT PCR (HOSPITAL ORDER, PERFORMED IN ~~LOC~~ HOSPITAL LAB): SARS Coronavirus 2: POSITIVE — AB

## 2018-07-05 LAB — C-REACTIVE PROTEIN
CRP: 7.4 mg/dL — ABNORMAL HIGH (ref <1.0)
CRP: 7.5 mg/dL — ABNORMAL HIGH (ref <1.0)
CRP: 6.7 mg/dL — ABNORMAL HIGH (ref <1.0)

## 2018-07-05 LAB — FIBRINOGEN: Fibrinogen: >800 mg/dL — ABNORMAL HIGH (ref 210–475)

## 2018-07-05 LAB — FERRITIN: Ferritin: 458 ng/mL — ABNORMAL HIGH (ref 11–307)

## 2018-07-05 LAB — CBG MONITORING, ED: Glucose-Capillary: 317 mg/dL — ABNORMAL HIGH (ref 70–99)

## 2018-07-05 MED ORDER — SODIUM CHLORIDE 0.9 % IV SOLN
1.0000 g | Freq: Once | INTRAVENOUS | Status: AC
  Administered 2018-07-05: 12:00:00 1 g via INTRAVENOUS
  Filled 2018-07-05: qty 10

## 2018-07-05 MED ORDER — HEPARIN SODIUM (PORCINE) 5000 UNIT/ML IJ SOLN
5000.0000 [IU] | Freq: Three times a day (TID) | INTRAMUSCULAR | Status: DC
  Administered 2018-07-05 – 2018-07-09 (×13): 5000 [IU] via SUBCUTANEOUS
  Filled 2018-07-05 (×13): qty 1

## 2018-07-05 MED ORDER — BISACODYL 5 MG PO TBEC
5.0000 mg | DELAYED_RELEASE_TABLET | Freq: Every day | ORAL | Status: DC | PRN
  Filled 2018-07-05: qty 1

## 2018-07-05 MED ORDER — SODIUM CHLORIDE 0.9 % IV BOLUS
1000.0000 mL | Freq: Once | INTRAVENOUS | Status: AC
  Administered 2018-07-05: 13:00:00 1000 mL via INTRAVENOUS

## 2018-07-05 MED ORDER — DOCUSATE SODIUM 100 MG PO CAPS
100.0000 mg | ORAL_CAPSULE | Freq: Two times a day (BID) | ORAL | Status: DC
  Administered 2018-07-05 – 2018-07-10 (×8): 100 mg via ORAL
  Filled 2018-07-05 (×9): qty 1

## 2018-07-05 MED ORDER — CLONIDINE HCL 0.3 MG/24HR TD PTWK
0.3000 mg | MEDICATED_PATCH | TRANSDERMAL | Status: DC
  Administered 2018-07-05: 0.3 mg via TRANSDERMAL
  Filled 2018-07-05 (×2): qty 1

## 2018-07-05 MED ORDER — LACTATED RINGERS IV SOLN
INTRAVENOUS | Status: DC
  Administered 2018-07-05 – 2018-07-06 (×3): via INTRAVENOUS

## 2018-07-05 MED ORDER — ENOXAPARIN SODIUM 30 MG/0.3ML ~~LOC~~ SOLN: 30.0000 mg | SUBCUTANEOUS | Status: DC

## 2018-07-05 MED ORDER — SODIUM CHLORIDE 0.9% FLUSH
3.0000 mL | Freq: Two times a day (BID) | INTRAVENOUS | Status: DC
  Administered 2018-07-05: 16:00:00 10 mL via INTRAVENOUS
  Administered 2018-07-07 – 2018-07-09 (×6): 3 mL via INTRAVENOUS

## 2018-07-05 MED ORDER — OXYCODONE HCL 5 MG PO TABS
5.0000 mg | ORAL_TABLET | ORAL | Status: DC | PRN
  Administered 2018-07-07 – 2018-07-08 (×2): 5 mg via ORAL
  Filled 2018-07-05 (×2): qty 1

## 2018-07-05 MED ORDER — INSULIN ASPART 100 UNIT/ML ~~LOC~~ SOLN
0.0000 [IU] | Freq: Three times a day (TID) | SUBCUTANEOUS | Status: DC
  Administered 2018-07-05 – 2018-07-06 (×2): 5 [IU] via SUBCUTANEOUS
  Administered 2018-07-07: 2 [IU] via SUBCUTANEOUS
  Administered 2018-07-07: 07:00:00 3 [IU] via SUBCUTANEOUS
  Administered 2018-07-07: 2 [IU] via SUBCUTANEOUS
  Administered 2018-07-08: 18:00:00 8 [IU] via SUBCUTANEOUS
  Administered 2018-07-08 – 2018-07-09 (×3): 3 [IU] via SUBCUTANEOUS
  Administered 2018-07-09: 14:00:00 5 [IU] via SUBCUTANEOUS
  Administered 2018-07-10: 09:00:00 8 [IU] via SUBCUTANEOUS

## 2018-07-05 MED ORDER — INSULIN ASPART 100 UNIT/ML ~~LOC~~ SOLN
0.0000 [IU] | Freq: Every day | SUBCUTANEOUS | Status: DC
  Administered 2018-07-06 – 2018-07-08 (×2): 2 [IU] via SUBCUTANEOUS

## 2018-07-05 MED ORDER — ONDANSETRON HCL 4 MG PO TABS
4.0000 mg | ORAL_TABLET | Freq: Four times a day (QID) | ORAL | Status: DC | PRN
  Administered 2018-07-08: 4 mg via ORAL
  Filled 2018-07-05: qty 1

## 2018-07-05 MED ORDER — ACETAMINOPHEN 325 MG PO TABS
650.0000 mg | ORAL_TABLET | Freq: Four times a day (QID) | ORAL | Status: DC | PRN
  Administered 2018-07-06 – 2018-07-08 (×2): 650 mg via ORAL
  Filled 2018-07-05 (×2): qty 2

## 2018-07-05 MED ORDER — ONDANSETRON HCL 4 MG/2ML IJ SOLN
4.0000 mg | Freq: Four times a day (QID) | INTRAMUSCULAR | Status: DC | PRN
  Administered 2018-07-07: 21:00:00 4 mg via INTRAVENOUS
  Filled 2018-07-05: qty 2

## 2018-07-05 MED ORDER — SODIUM CHLORIDE 0.9 % IV SOLN
500.0000 mg | Freq: Once | INTRAVENOUS | Status: AC
  Administered 2018-07-05: 13:00:00 500 mg via INTRAVENOUS
  Filled 2018-07-05: qty 500

## 2018-07-05 MED ORDER — SODIUM CHLORIDE 0.9 % IV SOLN: 500.0000 mg | INTRAVENOUS | Status: DC

## 2018-07-05 MED ORDER — SODIUM CHLORIDE 0.9 % IV BOLUS
500.0000 mL | Freq: Once | INTRAVENOUS | Status: AC
  Administered 2018-07-05: 12:00:00 500 mL via INTRAVENOUS

## 2018-07-05 MED ORDER — SODIUM CHLORIDE 0.9 % IV SOLN: 1.0000 g | INTRAVENOUS | Status: DC

## 2018-07-05 MED ORDER — POLYETHYLENE GLYCOL 3350 17 G PO PACK: 17.0000 g | PACK | Freq: Every day | ORAL | Status: DC | PRN

## 2018-07-05 NOTE — Progress Notes (Addendum)
I was able to communicate with the patient's husband, Doristine Counter at [phone]) via [name] interpreter using [employer] [phone]).   He will bring the patient's medications to the hospital, pharmacy will meet him and document them the best we can.   He stated they do not go to any doctors other than the Emergency Department. He stated [facility] comes to the house weekly. I contacted them and they discharged her from service on 06/25/18. They were providing nursing care only and no medications.    I told the him his wife's care team would use [employer] to keep him updated.   Romeo Rabon, PharmD. Mobile: [phone]. 07/05/2018,4:45 PM.

## 2018-07-05 NOTE — ED Provider Notes (Signed)
[facility] EMERGENCY DEPARTMENT Provider Note   CSN: [id number] Arrival date & time: 07/05/18  1020    History   Chief Complaint Chief Complaint  Patient presents with  . Fever  . Cough    HPI Kimberly Lane is a 58 y.o. female.    Level 5 caveat secondary to language barrier HPI 58 year old female who reportedly speaks [name] presents today from home.  Per EMS report her husband called them because she was coughing and short of breath.  They report that she had a fever of 101.9 on arrival.  Prehospital oxygen saturations without oxygen reportedly 89 to 91%. I attempted to interview patient via translator but patient was unable to answer questions for translator.  She was able to tell the translator her name but not the date.  She just replied that she was here because she was ill.  She appears to be awake and attempt to answer questions but, per translator, she was not answering the questions correctly No past medical history on file.  There are no active problems to display for this patient.  PMH Reported diabetes  OB History   No obstetric history on file.      Home Medications    Prior to Admission medications   Not on File    Family History No family history on file.  Social History Social History   Tobacco Use  . Smoking status: Not on file  Substance Use Topics  . Alcohol use: Not on file  . Drug use: Not on file     Allergies   Patient has no known allergies.   Review of Systems Review of Systems  Unable to perform ROS: Other     Physical Exam Updated Vital Signs Temp (!) 100.9 F (38.3 C) (Rectal)   Physical Exam Vitals signs and nursing note reviewed.  Constitutional:      General: She is not in acute distress.    Appearance: Normal appearance. She is ill-appearing.  HENT:     Head: Normocephalic.     Right Ear: External ear normal.     Left Ear: External ear normal.     Nose: Nose normal.   Mouth/Throat:     Mouth: Mucous membranes are dry.  Eyes:     Pupils: Pupils are equal, round, and reactive to light.  Neck:     Musculoskeletal: Normal range of motion.  Cardiovascular:     Rate and Rhythm: Normal rate.     Pulses: Normal pulses.  Pulmonary:     Effort: Pulmonary effort is normal.     Comments: Decreased breath sounds at base Abdominal:     General: Abdomen is flat. Bowel sounds are normal.     Palpations: Abdomen is soft.  Musculoskeletal:     Comments: Bilateral lower extremity amputations  Skin:    General: Skin is warm.     Capillary Refill: Capillary refill takes less than 2 seconds.     Comments: Skin appears dry  Neurological:     General: No focal deficit present.     Mental Status: She is alert.     Cranial Nerves: No cranial nerve deficit.     Comments: Patient answering her name but does not able to tell me the year or location      ED Treatments / Results  Labs (all labs ordered are listed, but only abnormal results are displayed) Labs Reviewed  CBG MONITORING, ED - Abnormal; Notable for the following components:  Result Value   Glucose-Capillary 317 (*)    All other components within normal limits  CULTURE, BLOOD (ROUTINE X 2)  CULTURE, BLOOD (ROUTINE X 2)  SARS CORONAVIRUS 2 (HOSPITAL ORDER, PERFORMED IN [facility])  LACTIC ACID, PLASMA  LACTIC ACID, PLASMA  CBC WITH DIFFERENTIAL/PLATELET  COMPREHENSIVE METABOLIC PANEL  D-DIMER, QUANTITATIVE (NOT AT [facility])  PROCALCITONIN  LACTATE DEHYDROGENASE  FERRITIN  TRIGLYCERIDES  FIBRINOGEN  C-REACTIVE PROTEIN  I-STAT BETA HCG BLOOD, ED ([facility], [facility], [facility] ONLY)    EKG EKG Interpretation  Date/Time:  Thursday Jul 05 2018 11:10:23 EDT Ventricular Rate:  61 PR Interval:    QRS Duration: 84 QT Interval:  450 QTC Calculation: 454 R Axis:   50 Text Interpretation:  Sinus rhythm Nonspecific T abnormalities, diffuse leads Confirmed by Pattricia Boss [phone]) on 07/05/2018 1:02:28 PM    Radiology Dg Chest Port 1 View  Result Date: 07/05/2018 CLINICAL DATA:  Cough for 1 week. EXAM: PORTABLE CHEST 1 VIEW COMPARISON:  None. FINDINGS: The heart is enlarged. There is mild vascular congestion. Early perihilar infiltrates are not excluded, greater on the LEFT. No effusion or pneumothorax. Bones unremarkable. IMPRESSION: Cardiomegaly with mild vascular congestion. Early perihilar infiltrates are not excluded, greater on the LEFT. Electronically Signed   By: Staci Righter M.D.   On: 07/05/2018 11:17    Procedures .Critical Care Performed by: Pattricia Boss, MD Authorized by: Pattricia Boss, MD   Critical care provider statement:    Critical care time (minutes):  45   Critical care end time:  07/05/2018 1:21 PM   Critical care was necessary to treat or prevent imminent or life-threatening deterioration of the following conditions:  Dehydration   Critical care was time spent personally by me on the following activities:  Discussions with consultants, evaluation of patient's response to treatment, examination of patient, ordering and performing treatments and interventions, ordering and review of laboratory studies, ordering and review of radiographic studies, pulse oximetry, re-evaluation of patient's condition, obtaining history from patient or surrogate and review of old charts   (including critical care time)  Medications Ordered in ED Medications - No data to display   Initial Impression / Assessment and Plan / ED Course  I have reviewed the triage vital signs and the nursing notes.  Pertinent labs & imaging results that were available during my care of the patient were reviewed by me and considered in my medical decision making (see chart for details).    58 year old female level 5 caveat secondary to language barrier presents today with reports of cough and fever.  Evaluation here includes x-Saxton Chain, labs, blood cultures, and EKG.  Patient is positive for COVID-19.  Her  creatinine is elevated today at 3.93.  To have hypoxia prehospital with sats at 90%.  Here she has had oxygen in place and sats have been 95%.  Heart rate has been normal at 60 and her blood pressure has been stable at 120/90 She is receiving IV fluids.  We are awaiting urine output. Plan admission for further evaluation and treatment Kimberly Lane was evaluated in Emergency Department on 07/05/2018 for the symptoms described in the history of present illness. She was evaluated in the context of the global COVID-19 pandemic, which necessitated consideration that the patient might be at risk for infection with the SARS-CoV-2 virus that causes COVID-19. Institutional protocols and algorithms that pertain to the evaluation of patients at risk for COVID-19 are in a state of rapid change based on information released by  regulatory bodies including the CDC and federal and state organizations. These policies and algorithms were followed during the patient's care in the ED.    Discussed with Dr. Lorin Mercy and will see for admission  Final Clinical Impressions(s) / ED Diagnoses   Final diagnoses:  None    ED Discharge Orders    None       Pattricia Boss, MD 07/05/18 1321

## 2018-07-05 NOTE — Progress Notes (Signed)
[facility] TEAM 1 - Stepdown/ICU TEAM  Kimberly Lane  QIH:[id number] DOB: 1960/07/07 DOA: 07/05/2018 PCP: Patient, No Pcp Per    Brief Narrative:  58yo w/ a hx of DM complicated by B BKA who presented to the ED with fever to 101.9, cough, and SOB. Neither the patient nor her husband speak [place].   Significant Events: 5/14 admit via [facility]  COVID-19 specific Treatment: None indicated thus far   Subjective: Pt was admitted earlier today. Extensive chart review completed. Admit orders reviewed and adjusted.   Assessment & Plan:  SARS CoV-2 infection - COVID 19   Acute on Chronic Kidney disease Baseline crt 1.89 as of March as provided by her PCP  PVD remote hx B amputations  DM A1c 13.5   HLD Usually on atorvastatin 40 QD  Normocytic anemia   DVT prophylaxis: lovenox  Code Status: FULL CODE Family Communication:  Disposition Plan:   Consultants:  none  Antimicrobials:  Rocephin 5/14 Azithromycin 5/14  Objective: Blood pressure (!) 142/59, pulse 65, temperature 99.6 F (37.6 C), temperature source Oral, resp. rate (!) 27, weight 56.8 kg, SpO2 98 %.  Intake/Output Summary (Last 24 hours) at 07/05/2018 1728 Last data filed at 07/05/2018 1450 Gross per 24 hour  Intake 2680.98 ml  Output -  Net 2680.98 ml   Filed Weights   07/05/18 1552  Weight: 56.8 kg    Examination: No exam - admitted earlier today   CBC: Recent Labs  Lab 07/05/18 1025  WBC 6.4  NEUTROABS 3.8  HGB 9.4*  HCT 30.7*  MCV 85.3  PLT 875   Basic Metabolic Panel: Recent Labs  Lab 07/05/18 1025  NA 136  K 4.1  CL 102  CO2 22  GLUCOSE 321*  BUN 45*  CREATININE 3.93*  CALCIUM 8.2*   GFR: CrCl cannot be calculated (Unknown ideal weight.).  Liver Function Tests: Recent Labs  Lab 07/05/18 1025  AST 52*  ALT 24  ALKPHOS 63  BILITOT 0.3  PROT 7.1  ALBUMIN 2.1*    HbA1C: Hgb A1c MFr Bld  Date/Time Value Ref Range Status  07/05/2018 10:25 AM 13.5 (H) 4.8 - 5.6 %  Final    Comment:    (NOTE) Pre diabetes:          5.7%-6.4% Diabetes:              >6.4% Glycemic control for   <7.0% adults with diabetes     CBG: Recent Labs  Lab 07/05/18 1034  GLUCAP 317*    Recent Results (from the past 240 hour(s))  SARS Coronavirus 2 [facility] order, Performed in [facility] hospital lab)     Status: Abnormal   Collection Time: 07/05/18 10:51 AM  Result Value Ref Range Status   SARS Coronavirus 2 POSITIVE (A) NEGATIVE Final    Comment: RESULT CALLED TO, READ BACK BY AND VERIFIED WITH: Freddie Breech RN, AT 1241 07/05/18 BY D. VANHOOK (NOTE) If result is NEGATIVE SARS-CoV-2 target nucleic acids are NOT DETECTED. The SARS-CoV-2 RNA is generally detectable in upper and lower  respiratory specimens during the acute phase of infection. The lowest  concentration of SARS-CoV-2 viral copies this assay can detect is 250  copies / mL. A negative result does not preclude SARS-CoV-2 infection  and should not be used as the sole basis for treatment or other  patient management decisions.  A negative result may occur with  improper specimen collection / handling, submission of specimen other  than nasopharyngeal swab,  presence of viral mutation(s) within the  areas targeted by this assay, and inadequate number of viral copies  (<250 copies / mL). A negative result must be combined with clinical  observations, patient history, and epidemiological information. If result is POSITIVE SARS-CoV-2 target nucleic acids are  DETECTED. The SARS-CoV-2 RNA is generally detectable in upper and lower  respiratory specimens during the acute phase of infection.  Positive  results are indicative of active infection with SARS-CoV-2.  Clinical  correlation with patient history and other diagnostic information is  necessary to determine patient infection status.  Positive results do  not rule out bacterial infection or co-infection with other viruses. If result is PRESUMPTIVE  POSTIVE SARS-CoV-2 nucleic acids MAY BE PRESENT.   A presumptive positive result was obtained on the submitted specimen  and confirmed on repeat testing.  While 2019 novel coronavirus  (SARS-CoV-2) nucleic acids may be present in the submitted sample  additional confirmatory testing may be necessary for epidemiological  and / or clinical management purposes  to differentiate between  SARS-CoV-2 and other Sarbecovirus currently known to infect humans.  If clinically indicated additional testing with an alternate test  methodology  [phone]) is advised. The SARS-CoV-2 RNA is generally  detectable in upper and lower respiratory specimens during the acute  phase of infection. The expected result is Negative. Fact Sheet for Patients:  StrictlyIdeas.no Fact Sheet for Healthcare Providers: BankingDealers.co.za This test is not yet approved or cleared by the [place] FDA and has been authorized for detection and/or diagnosis of SARS-CoV-2 by FDA under an Emergency Use Authorization (EUA).  This EUA will remain in effect (meaning this test can be used) for the duration of the COVID-19 declaration under Section 564(b)(1) of the Act, 21 U.S.C. section 360bbb-3(b)(1), unless the authorization is terminated or revoked sooner. Performed at [facility], [place] [address], [place], [place] [address]      Scheduled Meds: . docusate sodium  100 mg Oral BID  . heparin injection (subcutaneous)  5,000 Units Subcutaneous Q8H  . insulin aspart  0-15 Units Subcutaneous TID WC  . insulin aspart  0-5 Units Subcutaneous QHS  . sodium chloride flush  3 mL Intravenous Q12H      LOS: 0 days   Cherene Altes, MD [facility] Office  [phone] Pager - Text Page per Amion  If 7PM-7AM, please contact night-coverage per Amion 07/05/2018, 5:28 PM

## 2018-07-05 NOTE — ED Triage Notes (Addendum)
Pt arrived via [facility]; pt frm hm with c/o cough x 2 weeks; pt speaks [name]; pt is diabetic and bilateral amputee; EMS reports pt to have temp of 101.8, and repeat of 99.5; no meds given; cbg 485; 178/82; 18; 92-95 % on RA

## 2018-07-05 NOTE — Progress Notes (Signed)
Patient arrived to [facility] [address] on [address] [place]. O2 sats 90s. She is coughing. I used the interpreter to explain to her what all was going on and to attempt to assess her neuro status.

## 2018-07-05 NOTE — H&P (Addendum)
History and Physical    Kimberly Lane RXV:[id number] DOB: 1960/06/20 DOA: 07/05/2018  PCP: Patient, No Pcp Per Consultants:  None Patient coming from:  Home - lives with husband; NOK: Husband, [phone].  I attempted to call the husband to discuss with him.  He answered the phone and could not understand me.  I assumed that he was handing the phone to a family member, who could translate for me.  Instead, he is sitting in the ER lobby and handed the phone to one of the ER nurses - she is having trouble translating via Google and is going to try to explain to him via tele-translator.   Chief Complaint: Fever and cough  HPI: Kimberly Lane is a 58 y.o. female with medical history significant of DM with h/o B BKA presenting with fever and cough.  The patient does not speak any English and her husband was also unable to provide history.  There are 2 Epic charts for her which need to be merged, but neither includes prior data at all including Care Everywhere.  HPI per Dr. Jeanell Sparrow:  58 year old female who reportedly speaks [name] presents today from home. Per EMS report her husband called them because she was coughing and short of breath. They report that she had a fever of 101.9 on arrival. Prehospital oxygen saturations without oxygen reportedly 89 to 91%.  I attempted to interview patient via translator but patient was unable to answer questions for translator. She was able to tell the translator her name but not the date. She just replied that she was here because she was ill. She appears to be awake and attempt to answer questions but, per translator, she was not answering the questions correctly   ED Course:   Speaks [name], unable to answer questions via translator and no answer at home.  Known diabetic with fever and cough, COVID +.  No old records, creatinine 3.93, no urine so far, looks dry.  Review of Systems: Unable to perform  Ambulatory Status:  B amputations   Past Medical  History:  Diagnosis Date  . PVD (peripheral vascular disease) ([place])    s/p R BKA and L AKA  . Type 2 diabetes with peripheral circulatory disorder, controlled ([place])     History reviewed. No pertinent surgical history.  Social History   Socioeconomic History  . Marital status: Married    Spouse name: Not on file  . Number of children: Not on file  . Years of education: Not on file  . Highest education level: Not on file  Occupational History  . Not on file  Social Needs  . Financial resource strain: Not on file  . Food insecurity:    Worry: Not on file    Inability: Not on file  . Transportation needs:    Medical: Not on file    Non-medical: Not on file  Tobacco Use  . Smoking status: Not on file  Substance and Sexual Activity  . Alcohol use: Not on file  . Drug use: Not on file  . Sexual activity: Not on file  Lifestyle  . Physical activity:    Days per week: Not on file    Minutes per session: Not on file  . Stress: Not on file  Relationships  . Social connections:    Talks on phone: Not on file    Gets together: Not on file    Attends religious service: Not on file    Active member of club or  organization: Not on file    Attends meetings of clubs or organizations: Not on file    Relationship status: Not on file  . Intimate partner violence:    Fear of current or ex partner: Not on file    Emotionally abused: Not on file    Physically abused: Not on file    Forced sexual activity: Not on file  Other Topics Concern  . Not on file  Social History Narrative  . Not on file    No Known Allergies  History reviewed. No pertinent family history.  Prior to Admission medications   Not on File    Physical Exam: Vitals:   07/05/18 1145 07/05/18 1326 07/05/18 1327 07/05/18 1331  BP: (!) 118/93 (!) 134/56 133/84 105/65  Pulse: 61 61 62 61  Resp: 17 16 13 14   Temp:      TempSrc:      SpO2: 95% 97% 97% 97%     . General:  Appears calm and comfortable and  is NAD . Eyes:  PERRL, EOMI, normal lids, iris . ENT:  grossly normal hearing, lips & tongue, mildly dry mm . Neck:  no LAD, masses or thyromegaly . Cardiovascular:  RRR, no m/r/g. No LE edema.  Marland Kitchen Respiratory:   CTA bilaterally with no wheezes/rales/rhonchi.  Normal respiratory effort. . Abdomen:  soft, NT, ND, NABS . Back:   normal alignment, no CVAT . Skin:  no rash or induration seen on limited exam . Musculoskeletal:  S/p R BKA, L AKA.  Both stumps appear C/D/I. Marland Kitchen Psychiatric:  grossly normal mood and affect, speech sparse and in another language (?[name]) Neurologic:  CN 2-12 grossly intact    Radiological Exams on Admission: Dg Chest Port 1 View  Result Date: 07/05/2018 CLINICAL DATA:  Cough for 1 week. EXAM: PORTABLE CHEST 1 VIEW COMPARISON:  None. FINDINGS: The heart is enlarged. There is mild vascular congestion. Early perihilar infiltrates are not excluded, greater on the LEFT. No effusion or pneumothorax. Bones unremarkable. IMPRESSION: Cardiomegaly with mild vascular congestion. Early perihilar infiltrates are not excluded, greater on the LEFT. Electronically Signed   By: Staci Righter M.D.   On: 07/05/2018 11:17    EKG: Independently reviewed.  NSR with rate 61; nonspecific ST changes with no evidence of acute ischemia   Labs on Admission: I have personally reviewed the available labs and imaging studies at the time of the admission.  Pertinent labs:   Glucose 321 BUN 45/Creatinine 3.93/GFR 13 Albumin 2.1 AST 52/ALT 24 LDH 417 TG 195 Lactate 1.2 Procalcitonin 0.37 WBC 6.4 Hgb 9.4 D-dimer 2.84 Fibrinogen >800 Ferritin 458 CRP 7.4 COVID POSITIVE HCG negative Blood cultures pending  Assessment/Plan Principal Problem:   2019 novel coronavirus disease (COVID-19) Active Problems:   Type 2 diabetes with peripheral circulatory disorder, controlled (HCC)   PVD (peripheral vascular disease) ([place])   Acute renal failure (ARF) ([place])   COVID-19 infection  -Patient with presenting with SOB and fever; reported mild hypoxia via EMS but normal O2 sats on room air while in the ER -This case is severely complicated by lack of available history by patient/family and lack of all records from prior care -COVID positive -The patient has comorbidities which may increase the risk for ARDS/MODS including: age, DM, obesity -Exam is concerning for development of ARDS/MODS due to renal failure - although it is possible that this is chronic for the patient since there are no prior records available -However, she does have borderline hypotension and appears to  be mildly volume down which would correlate with at least some component of acute renal failure -Pertinent labs concerning for COVID include normal WBC count; increased BUN/Creatinine; very mildly increased LFTs; low-ish procalcitonin; markedly elevated D-dimer (>1); markedly elevated CRP (>7); increased LDH; increased ferritin -CXR with possible developing perihilar infiltrates -Will treat with CAP antibiotics given procalcitonin >0.1 -Will admit for further evaluation, close monitoring, and treatment on the GVC -Monitor on telemetry x at least 24 hours -At this time, will attempt to avoid use of aerosolized medications and use HFAs instead -Will check daily labs including BMP with Mag, Phos; LFTs; CBC with differential; CRP (q12h); ferritin; fibrinogen; D-dimer (q12h) -Will ask the patient to maintain an awake prone position for 16+ hours a day, if possible, with a minimum of 2-3 hours at a time -If the patient shows clinical deterioration, consider Solumedrol 40-80 mg IV q8h x 3 days and possibly transfer to ICU with PCCM consultation -Possible therapeutic interventions include hydroxychloroquine; IL-6 inhibitor (Tocilizumab, 8 mg/kg IV x 1 dose); and possibly Azithromycin, vitamin C, zinc -Will attempt to maintain euvolemia to a net negative fluid status; since she appears to be volume deficient at this  time, will run IVF at 75 cc/hr -With D-dimer <5, will use standard-dosed Lovenox for DVT prevention -Patient was seen wearing full PPE including: gown, gloves, head cover, N95, and face shield; donning and doffing was observed to be in compliance with current standards.  PVD -Remote h/o B amputations -No records available  DM -Will check A1c -Cover with moderate-scale SSI    DVT prophylaxis:  Lovenox  Code Status:  Full  Family Communication: None present; I attempted to speak to the husband by telephone but this was unsuccessful Disposition Plan:  Home once clinically improved Consults called: None  Admission status: Admit - It is my clinical opinion that admission to INPATIENT is reasonable and necessary because of the expectation that this patient will require hospital care that crosses at least 2 midnights to treat this condition based on the medical complexity of the problems presented.  Given the aforementioned information, the predictability of an adverse outcome is felt to be significant.    Addendum: I spoke with her PCP, Kimberly Lane, [facility].  They usually speak to the patient through a Pharmacist, community.  She stays home and lives with a significant other who is a laborer in a packing plant and likely brought it home since she doesn't generally leave the home.  She has no education at all.  She hates life - no family or friends here, wants to go back home, no children, doesn't like her husband, wants to divorce but can't, wishes she would die and had a suicide attempt a few years ago.  Her quality of life is really poor.  She had a prior amputation before coming to the [place]; her second was done here.  She doesn't like taking medicine because it doesn't make her feel better.  She is typically on Amlodipine 10; Atorvastatin 40; ASA 81; insulin glargine 15 units; Clonidine 0.3 patch weekly; Furosemide 20 daily; Spironolactone 25mg ; Senna-S qhs; vitamin D.  It is not  clear if she takes these medicines.  Her baseline vitals: SBP 160-190.  Last A1c was 8.9, down from 14 last year.  Baseline creatinine 1.38 6 months ago, GFR 49.  3/27: 1.89, GFR 33.  Baseline Hgb is about 10.    Karmen Bongo MD [facility]   How to contact the [facility] Attending  or Consulting provider [place] or covering provider during after hours [place], for this patient?  1. Check the care team in [facility] and look for a) attending/consulting TRH provider listed and b) the [facility] team listed 2. Log into [email or web address] and use [place]'s universal password to access. If you do not have the password, please contact the hospital operator. 3. Locate the [facility] provider you are looking for under [facility] and page to a number that you can be directly reached. 4. If you still have difficulty reaching the provider, please page the [facility] (Director on Call) for the Hospitalists listed on amion for assistance.   07/05/2018, 2:12 PM

## 2018-07-06 ENCOUNTER — Inpatient Hospital Stay (HOSPITAL_COMMUNITY): Payer: Medicare (Managed Care)

## 2018-07-06 LAB — CBC WITH DIFFERENTIAL/PLATELET
Abs Immature Granulocytes: 0.09 10*3/uL — ABNORMAL HIGH (ref 0.00–0.07)
Basophils Absolute: 0 10*3/uL (ref 0.0–0.1)
Basophils Relative: 0 %
Eosinophils Absolute: 0 10*3/uL (ref 0.0–0.5)
Eosinophils Relative: 1 %
HCT: 26.3 % — ABNORMAL LOW (ref 36.0–46.0)
Hemoglobin: 8.1 g/dL — ABNORMAL LOW (ref 12.0–15.0)
Immature Granulocytes: 1 %
Lymphocytes Relative: 38 %
Lymphs Abs: 2.9 10*3/uL (ref 0.7–4.0)
MCH: 25.6 pg — ABNORMAL LOW (ref 26.0–34.0)
MCHC: 30.8 g/dL (ref 30.0–36.0)
MCV: 83.2 fL (ref 80.0–100.0)
Monocytes Absolute: 0.5 10*3/uL (ref 0.1–1.0)
Monocytes Relative: 6 %
Neutro Abs: 4.1 10*3/uL (ref 1.7–7.7)
Neutrophils Relative %: 54 %
Platelets: 232 10*3/uL (ref 150–400)
RBC: 3.16 MIL/uL — ABNORMAL LOW (ref 3.87–5.11)
RDW: 14.4 % (ref 11.5–15.5)
WBC: 7.7 10*3/uL (ref 4.0–10.5)
nRBC: 0 % (ref 0.0–0.2)

## 2018-07-06 LAB — COMPREHENSIVE METABOLIC PANEL
ALT: 23 U/L (ref 0–44)
AST: 47 U/L — ABNORMAL HIGH (ref 15–41)
Albumin: 2.2 g/dL — ABNORMAL LOW (ref 3.5–5.0)
Alkaline Phosphatase: 58 U/L (ref 38–126)
Anion gap: 10 (ref 5–15)
BUN: 41 mg/dL — ABNORMAL HIGH (ref 6–20)
CO2: 23 mmol/L (ref 22–32)
Calcium: 8 mg/dL — ABNORMAL LOW (ref 8.9–10.3)
Chloride: 109 mmol/L (ref 98–111)
Creatinine, Ser: 2.96 mg/dL — ABNORMAL HIGH (ref 0.44–1.00)
GFR calc Af Amer: 19 mL/min — ABNORMAL LOW (ref >60)
GFR calc non Af Amer: 17 mL/min — ABNORMAL LOW (ref >60)
Glucose, Bld: 95 mg/dL (ref 70–99)
Potassium: 3.7 mmol/L (ref 3.5–5.1)
Sodium: 142 mmol/L (ref 135–145)
Total Bilirubin: 0.4 mg/dL (ref 0.3–1.2)
Total Protein: 6.5 g/dL (ref 6.5–8.1)

## 2018-07-06 LAB — GLUCOSE, CAPILLARY
Glucose-Capillary: 80 mg/dL (ref 70–99)
Glucose-Capillary: 222 mg/dL — ABNORMAL HIGH (ref 70–99)
Glucose-Capillary: 120 mg/dL — ABNORMAL HIGH (ref 70–99)
Glucose-Capillary: 207 mg/dL — ABNORMAL HIGH (ref 70–99)

## 2018-07-06 LAB — MAGNESIUM: Magnesium: 2.3 mg/dL (ref 1.7–2.4)

## 2018-07-06 LAB — D-DIMER, QUANTITATIVE: D-Dimer, Quant: 3.16 ug/mL-FEU — ABNORMAL HIGH (ref 0.00–0.50)

## 2018-07-06 LAB — HIV ANTIBODY (ROUTINE TESTING W REFLEX): HIV Screen 4th Generation wRfx: NONREACTIVE

## 2018-07-06 LAB — FERRITIN: Ferritin: 423 ng/mL — ABNORMAL HIGH (ref 11–307)

## 2018-07-06 LAB — PHOSPHORUS: Phosphorus: 4.1 mg/dL (ref 2.5–4.6)

## 2018-07-06 MED ORDER — INSULIN ASPART 100 UNIT/ML ~~LOC~~ SOLN
3.0000 [IU] | Freq: Three times a day (TID) | SUBCUTANEOUS | Status: DC
  Administered 2018-07-07 – 2018-07-10 (×8): 3 [IU] via SUBCUTANEOUS

## 2018-07-06 MED ORDER — GUAIFENESIN-DM 100-10 MG/5ML PO SYRP
5.0000 mL | ORAL_SOLUTION | ORAL | Status: DC | PRN
  Administered 2018-07-06 – 2018-07-07 (×3): 5 mL via ORAL
  Filled 2018-07-06 (×4): qty 5

## 2018-07-06 MED ORDER — ATORVASTATIN CALCIUM 40 MG PO TABS
40.0000 mg | ORAL_TABLET | Freq: Every day | ORAL | Status: DC
  Administered 2018-07-06 – 2018-07-10 (×5): 40 mg via ORAL
  Filled 2018-07-06 (×5): qty 1

## 2018-07-06 MED ORDER — BETHANECHOL CHLORIDE 10 MG PO TABS
10.0000 mg | ORAL_TABLET | Freq: Three times a day (TID) | ORAL | Status: DC
  Administered 2018-07-06 – 2018-07-09 (×10): 10 mg via ORAL
  Filled 2018-07-06 (×13): qty 1

## 2018-07-06 MED ORDER — AMLODIPINE-ATORVASTATIN 10-40 MG PO TABS: 1.0000 | ORAL_TABLET | Freq: Every day | ORAL | Status: DC

## 2018-07-06 MED ORDER — AMLODIPINE BESYLATE 5 MG PO TABS
10.0000 mg | ORAL_TABLET | Freq: Every day | ORAL | Status: DC
  Administered 2018-07-06 – 2018-07-10 (×5): 10 mg via ORAL
  Filled 2018-07-06 (×5): qty 2

## 2018-07-06 NOTE — Progress Notes (Addendum)
Inpatient Diabetes Program Recommendations  AACE/ADA: New Consensus Statement on Inpatient Glycemic Control (2015)  Target Ranges:  Prepandial:   less than 140 mg/dL      Peak postprandial:   less than 180 mg/dL (1-2 hours)      Critically ill patients:  140 - 180 mg/dL   Results for Lane, Kimberly HERGERT (MRN [id number]) as of 07/06/2018 13:01  Ref. Range 07/05/2018 10:34 07/05/2018 17:25 07/05/2018 21:12 07/06/2018 07:45 07/06/2018 11:40  Glucose-Capillary Latest Ref Range: 70 - 99 mg/dL 317 (H) 220 (H)  5 units NOVOLOG  98 80 222 (H)  5 units NOVOLOG    Results for Lane, Kimberly HINCHEY (MRN [id number]) as of 07/06/2018 13:01  Ref. Range 07/05/2018 10:25  Hemoglobin A1C Latest Ref Range: 4.8 - 5.6 % 13.5 (H)  (340 mg/dl)    Home DM Meds: Basaglar 15 units daily  Current Orders: Novolog Moderate Correction Scale/ SSI (0-15 units) TID AC + HS     MD- Please consider adding Novolog Meal Coverage to current inpatient insulin regimen:  Novolog 3 units TID with meals  (Please add the following Hold Parameters: Hold if pt eats <50% of meal, Hold if pt NPO)    Patient with poorly controlled CBGs at home as evidenced by Current A1c of 13.5%.  Called pt's PCP (Dr. Dorian Pod with [facility]) today.  Dr. Jimmye Norman told me that pt has many barriers that prevent her from having good CBG control at home (language barrier, cultural issues, lack of education).  Dr. Jimmye Norman told me that they have worked extensively with the patient in an effort to help her get better control, however, per Dr. Jimmye Norman, patient does not like to take her medications and will only take her meds when she wants to.  Was started on Bydureon as an outpatient and had good control on that medication with an A1c of 7.7% back in November, however, pt had severe GI issues with the Bydureon and the med had to be stopped in February of this year.  Basaglar insulin was started instead, however, per Dr. Jimmye Norman, pt will often  refuse to take her meds.  Dr. Jimmye Norman also told me that pt and her husband do not like the meals that [facility] provide and will often rely on heavy rice dishes at home instead of eating the healthier meals that [facility] provides.  Family noted to have food insecurity.  Per Dr. Jimmye Norman, pt refuses to drink the Protein supplements they give her as well.  Relayed current A1c of 13.5% to Dr. Jimmye Norman (PCP).  Dr. Jimmye Norman stated pt likely not taking her Basaglar insulin at home.    --Will follow patient during hospitalization--  Wyn Quaker RN, MSN, CDE Diabetes Coordinator Inpatient Glycemic Control Team Team Pager: [phone] (8a-5p)

## 2018-07-06 NOTE — Progress Notes (Signed)
[place] TEAM 1 - Stepdown/ICU TEAM  Kimberly Lane  CXK:[id number] DOB: 14-Jan-1961 DOA: 07/05/2018 PCP: Patient, No Pcp Per    Brief Narrative:  58yo w/ a hx of DM complicated by B BKA who presented to the ED with fever to 101.9, cough, and SOB. Neither the patient nor her husband speak [place]. SHE SPEAKS [place] ONLY.   Significant Events: 5/14 admit via   COVID-19 specific Treatment: None indicated thus far   Subjective: Spoke to pt using ipad interpreter. She was very gracious. She reported she was feeling better already. She reported some crampy abdom pain, and chest wall pain related to coughing. She denied n/v or diarrhea.    Assessment & Plan:  SARS CoV-2 infection - COVID 19  Mild progression of infiltrates noted on CXR today - clinically stable w/ minimal O2 support needed - follow   Acute on Chronic Kidney disease Baseline crt 1.89 as of March as provided by her PCP - crt improving w/ simple volume expansion - cont same and follow   Recent Labs  Lab 07/05/18 1025 07/06/18 0326  CREATININE 3.93* 2.96*    PVD remote hx B amputations  DM A1c 13.5 - CBG well controlled - follow for now w/o change in tx   HLD Cont atorvastatin 40 QD  Normocytic anemia  No evidence of blood loss  DVT prophylaxis: lovenox  Code Status: FULL CODE Family Communication:  Disposition Plan: tele bed   Consultants:  none  Antimicrobials:  Rocephin 5/14 Azithromycin 5/14  Objective: Blood pressure (!) 150/61, pulse (!) 58, temperature 98.5 F (36.9 C), resp. rate (!) 21, weight 56.3 kg, SpO2 94 %.  Intake/Output Summary (Last 24 hours) at 07/06/2018 0920 Last data filed at 07/06/2018 0245 Gross per 24 hour  Intake 3374.88 ml  Output 375 ml  Net 2999.88 ml   Filed Weights   07/05/18 1552 07/06/18 0415  Weight: 56.8 kg 56.3 kg    Examination: General: No acute respiratory distress Lungs: Clear to auscultation bilaterally without wheezes or crackles  Cardiovascular: Regular rate and rhythm without murmur gallop or rub normal S1 and S2 Abdomen: Nontender, nondistended, soft, bowel sounds positive, no rebound, no ascites, no appreciable mass Extremities: No significant cyanosis, clubbing, or edema bilateral lower extremities   CBC: Recent Labs  Lab 07/05/18 1025 07/06/18 0326  WBC 6.4 7.7  NEUTROABS 3.8 4.1  HGB 9.4* 8.1*  HCT 30.7* 26.3*  MCV 85.3 83.2  PLT 226 970   Basic Metabolic Panel: Recent Labs  Lab 07/05/18 1025 07/06/18 0326  NA 136 142  K 4.1 3.7  CL 102 109  CO2 22 23  GLUCOSE 321* 95  BUN 45* 41*  CREATININE 3.93* 2.96*  CALCIUM 8.2* 8.0*  MG  --  2.3  PHOS  --  4.1   GFR: CrCl cannot be calculated (Unknown ideal weight.).  Liver Function Tests: Recent Labs  Lab 07/05/18 1025 07/06/18 0326  AST 52* 47*  ALT 24 23  ALKPHOS 63 58  BILITOT 0.3 0.4  PROT 7.1 6.5  ALBUMIN 2.1* 2.2*    HbA1C: Hgb A1c MFr Bld  Date/Time Value Ref Range Status  07/05/2018 10:25 AM 13.5 (H) 4.8 - 5.6 % Final    Comment:    (NOTE) Pre diabetes:          5.7%-6.4% Diabetes:              >6.4% Glycemic control for   <7.0% adults with diabetes  CBG: Recent Labs  Lab 07/05/18 1034 07/05/18 1725 07/05/18 2112 07/06/18 0745  GLUCAP 317* 220* 98 80    Recent Results (from the past 240 hour(s))  SARS Coronavirus 2 [facility] order, Performed in [place] hospital lab)     Status: Abnormal   Collection Time: 07/05/18 10:51 AM  Result Value Ref Range Status   SARS Coronavirus 2 POSITIVE (A) NEGATIVE Final    Comment: RESULT CALLED TO, READ BACK BY AND VERIFIED WITH: Freddie Breech RN, AT 1241 07/05/18 BY D. VANHOOK (NOTE) If result is NEGATIVE SARS-CoV-2 target nucleic acids are NOT DETECTED. The SARS-CoV-2 RNA is generally detectable in upper and lower  respiratory specimens during the acute phase of infection. The lowest  concentration of SARS-CoV-2 viral copies this assay can detect is 250  copies  / mL. A negative result does not preclude SARS-CoV-2 infection  and should not be used as the sole basis for treatment or other  patient management decisions.  A negative result may occur with  improper specimen collection / handling, submission of specimen other  than nasopharyngeal swab, presence of viral mutation(s) within the  areas targeted by this assay, and inadequate number of viral copies  (<250 copies / mL). A negative result must be combined with clinical  observations, patient history, and epidemiological information. If result is POSITIVE SARS-CoV-2 target nucleic acids are  DETECTED. The SARS-CoV-2 RNA is generally detectable in upper and lower  respiratory specimens during the acute phase of infection.  Positive  results are indicative of active infection with SARS-CoV-2.  Clinical  correlation with patient history and other diagnostic information is  necessary to determine patient infection status.  Positive results do  not rule out bacterial infection or co-infection with other viruses. If result is PRESUMPTIVE POSTIVE SARS-CoV-2 nucleic acids MAY BE PRESENT.   A presumptive positive result was obtained on the submitted specimen  and confirmed on repeat testing.  While 2019 novel coronavirus  (SARS-CoV-2) nucleic acids may be present in the submitted sample  additional confirmatory testing may be necessary for epidemiological  and / or clinical management purposes  to differentiate between  SARS-CoV-2 and other Sarbecovirus currently known to infect humans.  If clinically indicated additional testing with an alternate test  methodology  [phone]) is advised. The SARS-CoV-2 RNA is generally  detectable in upper and lower respiratory specimens during the acute  phase of infection. The expected result is Negative. Fact Sheet for Patients:  StrictlyIdeas.no Fact Sheet for Healthcare Providers: BankingDealers.co.za This test  is not yet approved or cleared by the [place] FDA and has been authorized for detection and/or diagnosis of SARS-CoV-2 by FDA under an Emergency Use Authorization (EUA).  This EUA will remain in effect (meaning this test can be used) for the duration of the COVID-19 declaration under Section 564(b)(1) of the Act, 21 U.S.C. section 360bbb-3(b)(1), unless the authorization is terminated or revoked sooner. Performed at [facility], [place] [address], [place], [place] [address]      Scheduled Meds: . cloNIDine  0.3 mg Transdermal Weekly  . docusate sodium  100 mg Oral BID  . heparin injection (subcutaneous)  5,000 Units Subcutaneous Q8H  . insulin aspart  0-15 Units Subcutaneous TID WC  . insulin aspart  0-5 Units Subcutaneous QHS  . sodium chloride flush  3 mL Intravenous Q12H      LOS: 1 day   Cherene Altes, MD [facility] Office  [phone] Pager - Text Page per Shea Evans  If 7PM-7AM, please contact night-coverage per Amion 07/06/2018, 9:20 AM

## 2018-07-06 NOTE — Progress Notes (Signed)
Patient had moment of stress incontinence, bladder scanned and it said she had 549 in her bladder. Contacted doctor and got order to straight cath patient. Pulled out 550. Patient resting comfortably.

## 2018-07-06 NOTE — TOC Initial Note (Addendum)
Transition of Care [facility]) - Initial/Assessment Note    Patient Details  Name: Kimberly Lane MRN: [id number] Date of Birth: Nov 16, 1960  [facility] CM/SW Contact:    Midge Minium RN, BSN, NCM-BC, ACM-RN [phone] Phone Number: 07/06/2018, 11:53 AM  Clinical Narrative:                 58 yo female presented with fever, cough and SOB; COVID-19 (+). CM is following for dispositional needs. CM received a call from the patients SW Benita Dowdell ([facility]) @ [phone] to discuss the POC and her availability to assist with coordinating the patients dispositional needs and management of her healthcare need. PCP verified as: Dr. Dorian Pod @ [phone] ([facility]). CM received contact info for [facility] ([place]) [phone], for assistance with finding the interpreter which is familiar with the patient and able to speak the patients unique dialect. [facility] will be contacting the CM team for daily updates regarding the patients POC, with the CM team to continue to follow.   Expected Discharge Plan: Home/Self Care(Resume [facility]) Barriers to Discharge: Continued Medical Work up   Expected Discharge Plan and Services Expected Discharge Plan: Home/Self Care(Resume [facility]) In-house Referral: NA Discharge Planning Services: CM Consult   Living arrangements for the past 2 months: Single Family Home                  Prior Living Arrangements/Services Living arrangements for the past 2 months: Single Family Home Lives with:: Self, Spouse Patient language and need for interpreter reviewed:: Yes            Current home services: Other (comment)([facility])    Admission diagnosis:  Hyperglycemia [R73.9] COVID-19 [U07.1, J98.8] Patient Active Problem List   Diagnosis Date Noted  . 2019 novel coronavirus disease (COVID-19) 07/05/2018  . Acute renal failure (ARF) ([place]) 07/05/2018  . Type 2 diabetes with peripheral circulatory  disorder, controlled ([place])   . PVD (peripheral vascular disease) ([place])    PCP:  Angelica Pou, MD [phone] Pharmacy:  No Pharmacies Listed

## 2018-07-06 NOTE — Plan of Care (Signed)
  Problem: Clinical Measurements: Goal: Ability to maintain clinical measurements within normal limits will improve Outcome: Progressing   Problem: Clinical Measurements: Goal: Respiratory complications will improve Outcome: Progressing   

## 2018-07-06 NOTE — Progress Notes (Signed)
Spoke to patients husband with an interpretor on the line. Answered all questions and concerns. Informed patient of the designated times he can call for an update. Patients husband will call back around 10pm.

## 2018-07-07 DIAGNOSIS — J988 Other specified respiratory disorders: Secondary | ICD-10-CM

## 2018-07-07 DIAGNOSIS — J069 Acute upper respiratory infection, unspecified: Secondary | ICD-10-CM

## 2018-07-07 DIAGNOSIS — I739 Peripheral vascular disease, unspecified: Secondary | ICD-10-CM

## 2018-07-07 DIAGNOSIS — E1151 Type 2 diabetes mellitus with diabetic peripheral angiopathy without gangrene: Secondary | ICD-10-CM

## 2018-07-07 LAB — COMPREHENSIVE METABOLIC PANEL
ALT: 22 U/L (ref 0–44)
AST: 36 U/L (ref 15–41)
Albumin: 1.8 g/dL — ABNORMAL LOW (ref 3.5–5.0)
Alkaline Phosphatase: 64 U/L (ref 38–126)
Anion gap: 8 (ref 5–15)
BUN: 32 mg/dL — ABNORMAL HIGH (ref 6–20)
CO2: 26 mmol/L (ref 22–32)
Calcium: 8.1 mg/dL — ABNORMAL LOW (ref 8.9–10.3)
Chloride: 107 mmol/L (ref 98–111)
Creatinine, Ser: 2.35 mg/dL — ABNORMAL HIGH (ref 0.44–1.00)
GFR calc Af Amer: 25 mL/min — ABNORMAL LOW (ref >60)
GFR calc non Af Amer: 22 mL/min — ABNORMAL LOW (ref >60)
Glucose, Bld: 166 mg/dL — ABNORMAL HIGH (ref 70–99)
Potassium: 3.4 mmol/L — ABNORMAL LOW (ref 3.5–5.1)
Sodium: 141 mmol/L (ref 135–145)
Total Bilirubin: 0.2 mg/dL — ABNORMAL LOW (ref 0.3–1.2)
Total Protein: 6 g/dL — ABNORMAL LOW (ref 6.5–8.1)

## 2018-07-07 LAB — CBC WITH DIFFERENTIAL/PLATELET
Abs Immature Granulocytes: 0.22 10*3/uL — ABNORMAL HIGH (ref 0.00–0.07)
Basophils Absolute: 0 10*3/uL (ref 0.0–0.1)
Basophils Relative: 0 %
Eosinophils Absolute: 0.1 10*3/uL (ref 0.0–0.5)
Eosinophils Relative: 1 %
HCT: 23 % — ABNORMAL LOW (ref 36.0–46.0)
Hemoglobin: 7.1 g/dL — ABNORMAL LOW (ref 12.0–15.0)
Immature Granulocytes: 3 %
Lymphocytes Relative: 36 %
Lymphs Abs: 3.1 10*3/uL (ref 0.7–4.0)
MCH: 25.6 pg — ABNORMAL LOW (ref 26.0–34.0)
MCHC: 30.9 g/dL (ref 30.0–36.0)
MCV: 83 fL (ref 80.0–100.0)
Monocytes Absolute: 0.5 10*3/uL (ref 0.1–1.0)
Monocytes Relative: 6 %
Neutro Abs: 4.6 10*3/uL (ref 1.7–7.7)
Neutrophils Relative %: 54 %
Platelets: 226 10*3/uL (ref 150–400)
RBC: 2.77 MIL/uL — ABNORMAL LOW (ref 3.87–5.11)
RDW: 14.2 % (ref 11.5–15.5)
WBC: 8.5 10*3/uL (ref 4.0–10.5)
nRBC: 0 % (ref 0.0–0.2)

## 2018-07-07 LAB — CBC
HCT: 25.9 % — ABNORMAL LOW (ref 36.0–46.0)
Hemoglobin: 8.1 g/dL — ABNORMAL LOW (ref 12.0–15.0)
MCH: 25.8 pg — ABNORMAL LOW (ref 26.0–34.0)
MCHC: 31.3 g/dL (ref 30.0–36.0)
MCV: 82.5 fL (ref 80.0–100.0)
Platelets: 273 10*3/uL (ref 150–400)
RBC: 3.14 MIL/uL — ABNORMAL LOW (ref 3.87–5.11)
RDW: 14.1 % (ref 11.5–15.5)
WBC: 8.4 10*3/uL (ref 4.0–10.5)
nRBC: 0 % (ref 0.0–0.2)

## 2018-07-07 LAB — RETICULOCYTES
Immature Retic Fract: 26.1 % — ABNORMAL HIGH (ref 2.3–15.9)
RBC.: 3.18 MIL/uL — ABNORMAL LOW (ref 3.87–5.11)
Retic Count, Absolute: 25.4 10*3/uL (ref 19.0–186.0)
Retic Ct Pct: 0.8 % (ref 0.4–3.1)

## 2018-07-07 LAB — FERRITIN: Ferritin: 343 ng/mL — ABNORMAL HIGH (ref 11–307)

## 2018-07-07 LAB — GLUCOSE, CAPILLARY
Glucose-Capillary: 156 mg/dL — ABNORMAL HIGH (ref 70–99)
Glucose-Capillary: 150 mg/dL — ABNORMAL HIGH (ref 70–99)
Glucose-Capillary: 135 mg/dL — ABNORMAL HIGH (ref 70–99)
Glucose-Capillary: 123 mg/dL — ABNORMAL HIGH (ref 70–99)
Glucose-Capillary: 95 mg/dL (ref 70–99)

## 2018-07-07 LAB — IRON AND TIBC
Iron: 24 ug/dL — ABNORMAL LOW (ref 28–170)
Saturation Ratios: 15 % (ref 10.4–31.8)
TIBC: 156 ug/dL — ABNORMAL LOW (ref 250–450)
UIBC: 132 ug/dL

## 2018-07-07 LAB — VITAMIN B12: Vitamin B-12: 824 pg/mL (ref 180–914)

## 2018-07-07 LAB — FOLATE: Folate: 15.6 ng/mL (ref >5.9)

## 2018-07-07 MED ORDER — PANTOPRAZOLE SODIUM 40 MG PO TBEC
40.0000 mg | DELAYED_RELEASE_TABLET | Freq: Two times a day (BID) | ORAL | Status: DC
  Administered 2018-07-07 – 2018-07-10 (×7): 40 mg via ORAL
  Filled 2018-07-07 (×7): qty 1

## 2018-07-07 NOTE — Progress Notes (Signed)
Shift assessment completed with the use of virtual interpreter. Questions encouraged and answered.

## 2018-07-07 NOTE — Progress Notes (Addendum)
PROGRESS NOTE                                                                                                                                                                                                             Patient Demographics:    Kimberly Lane, is a 58 y.o. female, DOB - [birth year], KGY:[id number]  Admit date - 07/05/2018   Admitting Physician Karmen Bongo, MD  Outpatient Primary MD for the patient is Angelica Pou, MD  LOS - 2   Chief Complaint  Patient presents with  . Fever  . Cough       Brief Narrative    58 y.o. female with medical history significant of DM with h/o B BKA presenting with fever and cough.  Vascular disease, stent medications, hypertension, edema, resents to ED with fever one-point, cough, shortness of breath, cough with 19+, she was transferred to [facility].    Subjective:    Kimberly Lane today denies any shortness of breath, reports cough, fever or chills, report analyzed weakness .   Assessment  & Plan :    Principal Problem:   2019 novel coronavirus disease (COVID-19) Active Problems:   Type 2 diabetes with peripheral circulatory disorder, controlled (HCC)   PVD (peripheral vascular disease) (HCC)   Acute renal failure (ARF) (HCC)   SARS CoV-2 infection - COVID 19  -She was on 2 L nasal cannula this morning, saturating  95% I have weaned her off, discussed with staff to monitor. - mild progression of infiltrates noted on CXR  -We will monitor inflammatory markers closely, so far I see no indications for steroid or actemra, unless she is havine worsening hypoxia.  AKI and CKD -Baseline creatinine 1.89  was on admission improving with IV fluids, at least 2.3 today, I will DC her fluids, avoid nephrotoxic medications.  PVD - S/P remote hx B amputations  DM -Reportedly, A1c 13.5 - CBG well controlled, she is noncompliant with medication at home, continue with insulin sliding scale during hospital  stay.  HLD - Cont atorvastatin 40 QD  Normocytic anemia  -No evidence of blood loss, baseline hemoglobin around 9, it is 7.1 this morning, there is likely some dilution of fluids. -There is no significant GI bleed, as I have discussed with staff, and she has normal color stool. -Will obtain anemia panel, keep active  type and screen, likely will need procrit, will transfuse for hemoglobin <7.  Hypertension - continue with norvasc, and Clonidine.  COVID-19 Labs  Recent Labs    07/05/18 1025 07/05/18 1114 07/05/18 1418 07/05/18 1800 07/06/18 0326 07/07/18 0130  DDIMER 2.84*  --   --   --  3.16*  --   FERRITIN  --  458*  --   --  423* 343*  LDH 417*  --   --   --   --   --   CRP  --  7.4* 7.5* 6.7*  --   --     Lab Results  Component Value Date   SARSCOV2NAA POSITIVE (A) 07/05/2018     Code Status : Full  Family Communication  : will D/W husband today.  Disposition Plan  : home when stable  Barriers For Discharge :  AKI required further management  Consults  :  None  Procedures  : none  DVT Prophylaxis  : [place] heparin  Lab Results  Component Value Date   PLT 226 07/07/2018    Antibiotics  :    Anti-infectives (From admission, onward)   Start     Dose/Rate Route Frequency Ordered Stop   07/06/18 1400  azithromycin (ZITHROMAX) 500 mg in sodium chloride 0.9 % 250 mL IVPB  Status:  Discontinued     500 mg 250 mL/hr over 60 Minutes Intravenous Every 24 hours 07/05/18 1350 07/05/18 1635   07/06/18 1200  cefTRIAXone (ROCEPHIN) 1 g in sodium chloride 0.9 % 100 mL IVPB  Status:  Discontinued     1 g 200 mL/hr over 30 Minutes Intravenous Every 24 hours 07/05/18 1350 07/05/18 1635   07/05/18 1200  cefTRIAXone (ROCEPHIN) 1 g in sodium chloride 0.9 % 100 mL IVPB     1 g 200 mL/hr over 30 Minutes Intravenous  Once 07/05/18 1148 07/05/18 1310   07/05/18 1200  azithromycin (ZITHROMAX) 500 mg in sodium chloride 0.9 % 250 mL IVPB     500 mg 250 mL/hr over 60 Minutes  Intravenous  Once 07/05/18 1148 07/05/18 1450        Objective:   Vitals:   07/06/18 2012 07/07/18 0441 07/07/18 0727 07/07/18 0854  BP:    (!) 166/63  Pulse:      Resp:      Temp: 98.3 F (36.8 C) 97.7 F (36.5 C) (!) 97.5 F (36.4 C)   TempSrc: Oral Oral Oral   SpO2:      Weight:        Wt Readings from Last 3 Encounters:  07/06/18 56.3 kg     Intake/Output Summary (Last 24 hours) at 07/07/2018 1228 Last data filed at 07/07/2018 0700 Gross per 24 hour  Intake 715.64 ml  Output 500 ml  Net 215.64 ml     Physical Exam  Awake Alert, Oriented X 3, No new F.N deficits, Normal affect Symmetrical Chest wall movement, Good air movement bilaterally, CTAB RRR,No Gallops,Rubs or new Murmurs, No Parasternal Heave +ve B.Sounds, Abd Soft, No tenderness,  No rebound - guarding or rigidity. S/P R BKA, L AKA, both stump C/D/I.    Data Review:    CBC Recent Labs  Lab 07/05/18 1025 07/06/18 0326 07/07/18 0130  WBC 6.4 7.7 8.5  HGB 9.4* 8.1* 7.1*  HCT 30.7* 26.3* 23.0*  PLT 226 232 226  MCV 85.3 83.2 83.0  MCH 26.1 25.6* 25.6*  MCHC 30.6 30.8 30.9  RDW 14.2 14.4 14.2  LYMPHSABS 2.5 2.9 3.1  MONOABS 0.4 0.5 0.5  EOSABS 0.0 0.0 0.1  BASOSABS 0.0 0.0 0.0    Chemistries  Recent Labs  Lab 07/05/18 1025 07/06/18 0326 07/07/18 0130  NA 136 142 141  K 4.1 3.7 3.4*  CL 102 109 107  CO2 22 23 26   GLUCOSE 321* 95 166*  BUN 45* 41* 32*  CREATININE 3.93* 2.96* 2.35*  CALCIUM 8.2* 8.0* 8.1*  MG  --  2.3  --   AST 52* 47* 36  ALT 24 23 22   ALKPHOS 63 58 64  BILITOT 0.3 0.4 0.2*   ------------------------------------------------------------------------------------------------------------------ Recent Labs    07/05/18 1025  TRIG 195*    Lab Results  Component Value Date   HGBA1C 13.5 (H) 07/05/2018   ------------------------------------------------------------------------------------------------------------------ No results for input(s): TSH, T4TOTAL,  T3FREE, THYROIDAB in the last 72 hours.  Invalid input(s): FREET3 ------------------------------------------------------------------------------------------------------------------ Recent Labs    07/06/18 0326 07/07/18 0130  FERRITIN 423* 343*    Coagulation profile No results for input(s): INR, PROTIME in the last 168 hours.  Recent Labs    07/05/18 1025 07/06/18 0326  DDIMER 2.84* 3.16*    Cardiac Enzymes No results for input(s): CKMB, TROPONINI, MYOGLOBIN in the last 168 hours.  Invalid input(s): CK ------------------------------------------------------------------------------------------------------------------ No results found for: BNP  Inpatient Medications  Scheduled Meds: . amLODipine  10 mg Oral Daily   And  . atorvastatin  40 mg Oral Daily  . bethanechol  10 mg Oral TID  . cloNIDine  0.3 mg Transdermal Weekly  . docusate sodium  100 mg Oral BID  . heparin injection (subcutaneous)  5,000 Units Subcutaneous Q8H  . insulin aspart  0-15 Units Subcutaneous TID WC  . insulin aspart  0-5 Units Subcutaneous QHS  . insulin aspart  3 Units Subcutaneous TID WC  . pantoprazole  40 mg Oral BID  . sodium chloride flush  3 mL Intravenous Q12H   Continuous Infusions: . lactated ringers 50 mL/hr at 07/06/18 1829   PRN Meds:.acetaminophen, bisacodyl, guaiFENesin-dextromethorphan, ondansetron **OR** ondansetron (ZOFRAN) IV, oxyCODONE, polyethylene glycol  Micro Results Recent Results (from the past 240 hour(s))  Blood Culture (routine x 2)     Status: None (Preliminary result)   Collection Time: 07/05/18 10:49 AM  Result Value Ref Range Status   Specimen Description BLOOD RIGHT HAND  Final   Special Requests   Final    BOTTLES DRAWN AEROBIC AND ANAEROBIC Blood Culture adequate volume   Culture   Final    NO GROWTH 2 DAYS Performed at [facility], [place] [address] [place], [place] [address]    Report Status PENDING  Incomplete  SARS Coronavirus 2 [facility]  order, Performed in [place] hospital lab)     Status: Abnormal   Collection Time: 07/05/18 10:51 AM  Result Value Ref Range Status   SARS Coronavirus 2 POSITIVE (A) NEGATIVE Final    Comment: RESULT CALLED TO, READ BACK BY AND VERIFIED WITH: Freddie Breech RN, AT 1241 07/05/18 BY D. VANHOOK (NOTE) If result is NEGATIVE SARS-CoV-2 target nucleic acids are NOT DETECTED. The SARS-CoV-2 RNA is generally detectable in upper and lower  respiratory specimens during the acute phase of infection. The lowest  concentration of SARS-CoV-2 viral copies this assay can detect is 250  copies / mL. A negative result does not preclude SARS-CoV-2 infection  and should not be used as the sole basis for treatment or other  patient management decisions.  A negative result may occur with  improper specimen collection / handling,  submission of specimen other  than nasopharyngeal swab, presence of viral mutation(s) within the  areas targeted by this assay, and inadequate number of viral copies  (<250 copies / mL). A negative result must be combined with clinical  observations, patient history, and epidemiological information. If result is POSITIVE SARS-CoV-2 target nucleic acids are  DETECTED. The SARS-CoV-2 RNA is generally detectable in upper and lower  respiratory specimens during the acute phase of infection.  Positive  results are indicative of active infection with SARS-CoV-2.  Clinical  correlation with patient history and other diagnostic information is  necessary to determine patient infection status.  Positive results do  not rule out bacterial infection or co-infection with other viruses. If result is PRESUMPTIVE POSTIVE SARS-CoV-2 nucleic acids MAY BE PRESENT.   A presumptive positive result was obtained on the submitted specimen  and confirmed on repeat testing.  While 2019 novel coronavirus  (SARS-CoV-2) nucleic acids may be present in the submitted sample  additional confirmatory testing  may be necessary for epidemiological  and / or clinical management purposes  to differentiate between  SARS-CoV-2 and other Sarbecovirus currently known to infect humans.  If clinically indicated additional testing with an alternate test  methodology  [phone]) is advised. The SARS-CoV-2 RNA is generally  detectable in upper and lower respiratory specimens during the acute  phase of infection. The expected result is Negative. Fact Sheet for Patients:  StrictlyIdeas.no Fact Sheet for Healthcare Providers: BankingDealers.co.za This test is not yet approved or cleared by the [place] FDA and has been authorized for detection and/or diagnosis of SARS-CoV-2 by FDA under an Emergency Use Authorization (EUA).  This EUA will remain in effect (meaning this test can be used) for the duration of the COVID-19 declaration under Section 564(b)(1) of the Act, 21 U.S.C. section 360bbb-3(b)(1), unless the authorization is terminated or revoked sooner. Performed at [facility], [place] [address] [place], [place] [address]   Blood Culture (routine x 2)     Status: None (Preliminary result)   Collection Time: 07/05/18 10:54 AM  Result Value Ref Range Status   Specimen Description BLOOD LEFT WRIST  Final   Special Requests   Final    BOTTLES DRAWN AEROBIC AND ANAEROBIC Blood Culture adequate volume   Culture   Final    NO GROWTH 2 DAYS Performed at [facility], [place] [address], [place], [place] [address]    Report Status PENDING  Incomplete    Radiology Reports Dg Chest Port 1 View  Result Date: 07/06/2018 CLINICAL DATA:  58 year old female with COVID-19. EXAM: PORTABLE CHEST 1 VIEW COMPARISON:  07/05/2018. FINDINGS: Portable AP upright view at 0508 hours. Mildly increased patchy and peripheral bilateral pulmonary opacity. Stable lung volumes. Stable cardiac size and mediastinal contours. Visualized tracheal air column is within normal  limits. No pneumothorax or pleural effusion. Paucity of bowel gas in the upper abdomen. IMPRESSION: Mild radiographic progression of bilateral COVID-19 pneumonia. Electronically Signed   By: Genevie Ann M.D.   On: 07/06/2018 08:36   Dg Chest Port 1 View  Result Date: 07/05/2018 CLINICAL DATA:  Cough for 1 week. EXAM: PORTABLE CHEST 1 VIEW COMPARISON:  None. FINDINGS: The heart is enlarged. There is mild vascular congestion. Early perihilar infiltrates are not excluded, greater on the LEFT. No effusion or pneumothorax. Bones unremarkable. IMPRESSION: Cardiomegaly with mild vascular congestion. Early perihilar infiltrates are not excluded, greater on the LEFT. Electronically Signed   By: Staci Righter M.D.   On:  07/05/2018 11:17    Time Spent in minutes  35 minutes   Phillips Climes M.D on 07/07/2018 at 12:28 PM  Between 7am to 7pm - Pager - [phone]  After 7pm go to [email or web address] - password [facility]  [facility] -  Office  [phone]

## 2018-07-08 ENCOUNTER — Encounter (HOSPITAL_COMMUNITY): Payer: Self-pay

## 2018-07-08 ENCOUNTER — Other Ambulatory Visit: Payer: Self-pay

## 2018-07-08 DIAGNOSIS — R112 Nausea with vomiting, unspecified: Secondary | ICD-10-CM

## 2018-07-08 DIAGNOSIS — N179 Acute kidney failure, unspecified: Secondary | ICD-10-CM

## 2018-07-08 LAB — TYPE AND SCREEN
ABO/RH(D): A POS
Antibody Screen: POSITIVE
DAT, IgG: NEGATIVE

## 2018-07-08 LAB — CBC WITH DIFFERENTIAL/PLATELET
Abs Immature Granulocytes: 0.38 10*3/uL — ABNORMAL HIGH (ref 0.00–0.07)
Basophils Absolute: 0 10*3/uL (ref 0.0–0.1)
Basophils Relative: 0 %
Eosinophils Absolute: 0.1 10*3/uL (ref 0.0–0.5)
Eosinophils Relative: 2 %
HCT: 25.3 % — ABNORMAL LOW (ref 36.0–46.0)
Hemoglobin: 7.9 g/dL — ABNORMAL LOW (ref 12.0–15.0)
Immature Granulocytes: 4 %
Lymphocytes Relative: 36 %
Lymphs Abs: 3.4 10*3/uL (ref 0.7–4.0)
MCH: 25.6 pg — ABNORMAL LOW (ref 26.0–34.0)
MCHC: 31.2 g/dL (ref 30.0–36.0)
MCV: 82.1 fL (ref 80.0–100.0)
Monocytes Absolute: 0.5 10*3/uL (ref 0.1–1.0)
Monocytes Relative: 6 %
Neutro Abs: 5.1 10*3/uL (ref 1.7–7.7)
Neutrophils Relative %: 52 %
Platelets: 284 10*3/uL (ref 150–400)
RBC: 3.08 MIL/uL — ABNORMAL LOW (ref 3.87–5.11)
RDW: 14.2 % (ref 11.5–15.5)
WBC: 9.6 10*3/uL (ref 4.0–10.5)
nRBC: 0.2 % (ref 0.0–0.2)

## 2018-07-08 LAB — COMPREHENSIVE METABOLIC PANEL
ALT: 20 U/L (ref 0–44)
AST: 32 U/L (ref 15–41)
Albumin: 1.7 g/dL — ABNORMAL LOW (ref 3.5–5.0)
Alkaline Phosphatase: 71 U/L (ref 38–126)
Anion gap: 9 (ref 5–15)
BUN: 26 mg/dL — ABNORMAL HIGH (ref 6–20)
CO2: 24 mmol/L (ref 22–32)
Calcium: 7.9 mg/dL — ABNORMAL LOW (ref 8.9–10.3)
Chloride: 107 mmol/L (ref 98–111)
Creatinine, Ser: 1.98 mg/dL — ABNORMAL HIGH (ref 0.44–1.00)
GFR calc Af Amer: 31 mL/min — ABNORMAL LOW (ref >60)
GFR calc non Af Amer: 27 mL/min — ABNORMAL LOW (ref >60)
Glucose, Bld: 92 mg/dL (ref 70–99)
Potassium: 3.9 mmol/L (ref 3.5–5.1)
Sodium: 140 mmol/L (ref 135–145)
Total Bilirubin: 0.4 mg/dL (ref 0.3–1.2)
Total Protein: 5.6 g/dL — ABNORMAL LOW (ref 6.5–8.1)

## 2018-07-08 LAB — GLUCOSE, CAPILLARY
Glucose-Capillary: 154 mg/dL — ABNORMAL HIGH (ref 70–99)
Glucose-Capillary: 200 mg/dL — ABNORMAL HIGH (ref 70–99)
Glucose-Capillary: 287 mg/dL — ABNORMAL HIGH (ref 70–99)
Glucose-Capillary: 121 mg/dL — ABNORMAL HIGH (ref 70–99)

## 2018-07-08 LAB — C-REACTIVE PROTEIN: CRP: 7.6 mg/dL — ABNORMAL HIGH (ref <1.0)

## 2018-07-08 MED ORDER — DARBEPOETIN ALFA 40 MCG/0.4ML IJ SOSY
40.0000 ug | PREFILLED_SYRINGE | INTRAMUSCULAR | Status: DC
  Administered 2018-07-08: 40 ug via SUBCUTANEOUS
  Filled 2018-07-08: qty 0.4

## 2018-07-08 MED ORDER — METOCLOPRAMIDE HCL 5 MG/ML IJ SOLN
10.0000 mg | Freq: Three times a day (TID) | INTRAMUSCULAR | Status: DC | PRN
  Administered 2018-07-08: 10 mg via INTRAVENOUS
  Filled 2018-07-08 (×2): qty 2

## 2018-07-08 MED ORDER — METOCLOPRAMIDE HCL 5 MG/ML IJ SOLN
5.0000 mg | Freq: Three times a day (TID) | INTRAMUSCULAR | Status: DC | PRN
  Administered 2018-07-09 (×2): 5 mg via INTRAVENOUS
  Filled 2018-07-08 (×3): qty 1

## 2018-07-08 MED ORDER — BOOST / RESOURCE BREEZE PO LIQD CUSTOM
1.0000 | Freq: Three times a day (TID) | ORAL | Status: DC
  Administered 2018-07-08 – 2018-07-10 (×5): 1 via ORAL
  Filled 2018-07-08 (×10): qty 1

## 2018-07-08 MED ORDER — HYDROCODONE-ACETAMINOPHEN 5-325 MG PO TABS
1.0000 | ORAL_TABLET | Freq: Four times a day (QID) | ORAL | Status: DC | PRN
  Administered 2018-07-08 – 2018-07-09 (×2): 1 via ORAL
  Filled 2018-07-08 (×2): qty 1

## 2018-07-08 MED ORDER — METOCLOPRAMIDE HCL 5 MG/ML IJ SOLN
10.0000 mg | Freq: Three times a day (TID) | INTRAMUSCULAR | Status: AC
  Administered 2018-07-08 – 2018-07-09 (×3): 10 mg via INTRAVENOUS
  Filled 2018-07-08 (×3): qty 2

## 2018-07-08 NOTE — Progress Notes (Signed)
PROGRESS NOTE                                                                                                                                                                                                             Patient Demographics:    Kimberly Lane, is a 58 y.o. female, DOB - 1960-12-28, BTD:[id number]  Admit date - 07/05/2018   Admitting Physician Karmen Bongo, MD  Outpatient Primary MD for the patient is Angelica Pou, MD  LOS - 3   Chief Complaint  Patient presents with  . Fever  . Cough       Brief Narrative    58 y.o. female with medical history significant of DM with h/o B BKA presenting with fever and cough.  Vascular disease, stent medications, hypertension, edema, resents to ED with fever one-point, cough, shortness of breath, cough with 19+, she was transferred to [facility].    Subjective:    Kimberly Lane today denies any shortness of breath, he is afebrile 24 hours, she reports nausea, vomiting , unable to tolerate oral intake.   Assessment  & Plan :    Principal Problem:   2019 novel coronavirus disease (COVID-19) Active Problems:   Type 2 diabetes with peripheral circulatory disorder, controlled (HCC)   PVD (peripheral vascular disease) ([place])   Acute renal failure (ARF) ([place])   SARS CoV-2 infection - COVID 19  - Initially she was on 2 L nasal cannula , she is currently on room air.r. - mild progression of infiltrates noted on CXR  -Continue  monitor inflammatory markers closely, so far I see no indications for steroid or actemra, unless she is havine worsening hypoxia. COVID-19 Labs  Recent Labs    07/05/18 1418 07/05/18 1800 07/06/18 0326 07/07/18 0130 07/08/18 0348  DDIMER  --   --  3.16*  --   --   FERRITIN  --   --  423* 343*  --   CRP 7.5* 6.7*  --   --  7.6*    Lab Results  Component Value Date   SARSCOV2NAA POSITIVE (A) 07/05/2018     AKI and CKD - Baseline creatinine 1.89 ,  Was elevated  3.93  on admission, resolved with IV fluid.creatinine back to baseline at 1.98 this morning. - avoid nephrotoxic medications.  PVD - S/P remote hx B amputations  DM -Reportedly, A1c 13.5 - CBG well controlled, she is noncompliant with medication at home, continue with insulin sliding scale during hospital stay.  Nausea vomiting -With significant nausea and vomiting this morning, tolerated her breakfast meds, this is most likely related to gastroparesis given her poorly controlled diabetes mellitus, scheduled Reglan for next 4 hours, then will change of as needed, will change her diet to clear liquid, will decrease  narcotics will change oxycodone to Vicodin. - will check Lipase  HLD - Cont atorvastatin 40 QD  Normocytic anemia  -No evidence of blood loss, globin is 7.9 this morning . -Anemia  of chronic kidney disease, will start on Aranesp . -There is no significant GI bleed, as I have discussed with staff, and she has normal color stool.  Hypertension - continue with norvasc, and Clonidine.  COVID-19 Labs  Recent Labs    07/05/18 1418 07/05/18 1800 07/06/18 0326 07/07/18 0130 07/08/18 0348  DDIMER  --   --  3.16*  --   --   FERRITIN  --   --  423* 343*  --   CRP 7.5* 6.7*  --   --  7.6*    Lab Results  Component Value Date   SARSCOV2NAA POSITIVE (A) 07/05/2018     Code Status : Full  Family Communication  : D/W husband via phone 07/07/2018, will call today.  Disposition Plan  : home when stable  Barriers For Discharge :  AKI required further management  Consults  :  None  Procedures  : none  DVT Prophylaxis  : [place] heparin  Lab Results  Component Value Date   PLT 284 07/08/2018    Antibiotics  :    Anti-infectives (From admission, onward)   Start     Dose/Rate Route Frequency Ordered Stop   07/06/18 1400  azithromycin (ZITHROMAX) 500 mg in sodium chloride 0.9 % 250 mL IVPB  Status:  Discontinued     500 mg 250 mL/hr over 60 Minutes Intravenous  Every 24 hours 07/05/18 1350 07/05/18 1635   07/06/18 1200  cefTRIAXone (ROCEPHIN) 1 g in sodium chloride 0.9 % 100 mL IVPB  Status:  Discontinued     1 g 200 mL/hr over 30 Minutes Intravenous Every 24 hours 07/05/18 1350 07/05/18 1635   07/05/18 1200  cefTRIAXone (ROCEPHIN) 1 g in sodium chloride 0.9 % 100 mL IVPB     1 g 200 mL/hr over 30 Minutes Intravenous  Once 07/05/18 1148 07/05/18 1310   07/05/18 1200  azithromycin (ZITHROMAX) 500 mg in sodium chloride 0.9 % 250 mL IVPB     500 mg 250 mL/hr over 60 Minutes Intravenous  Once 07/05/18 1148 07/05/18 1450        Objective:   Vitals:   07/08/18 0700 07/08/18 0845 07/08/18 1100 07/08/18 1212  BP:  (!) 144/67 (!) 140/52 137/63  Pulse: (!) 56  (!) 55   Resp:    20  Temp:    97.7 F (36.5 C)  TempSrc:    Oral  SpO2: 94%  95%   Weight:      Height:   5\' 2"  (1.575 m)     Wt Readings from Last 3 Encounters:  07/06/18 56.3 kg     Intake/Output Summary (Last 24 hours) at 07/08/2018 1338 Last data filed at 07/08/2018 0858 Gross per 24 hour  Intake 624.71 ml  Output 1700 ml  Net -1075.29 ml     Physical Exam  Awake Alert, Oriented X 3, No new F.N  deficits, Normal affect, in mild discomfort secondary to nausea and vomiting Symmetrical Chest wall movement, Good air movement bilaterally, CTAB RRR,No Gallops,Rubs or new Murmurs, No Parasternal Heave +ve B.Sounds, Abd Soft, mild epigastric tenderness, No rebound - guarding or rigidity S/P R BKA, L AKA, both stump C/D/I.    Data Review:    CBC Recent Labs  Lab 07/05/18 1025 07/06/18 0326 07/07/18 0130 07/07/18 1530 07/08/18 0348  WBC 6.4 7.7 8.5 8.4 9.6  HGB 9.4* 8.1* 7.1* 8.1* 7.9*  HCT 30.7* 26.3* 23.0* 25.9* 25.3*  PLT 226 232 226 273 284  MCV 85.3 83.2 83.0 82.5 82.1  MCH 26.1 25.6* 25.6* 25.8* 25.6*  MCHC 30.6 30.8 30.9 31.3 31.2  RDW 14.2 14.4 14.2 14.1 14.2  LYMPHSABS 2.5 2.9 3.1  --  3.4  MONOABS 0.4 0.5 0.5  --  0.5  EOSABS 0.0 0.0 0.1  --  0.1   BASOSABS 0.0 0.0 0.0  --  0.0    Chemistries  Recent Labs  Lab 07/05/18 1025 07/06/18 0326 07/07/18 0130 07/08/18 0348  NA 136 142 141 140  K 4.1 3.7 3.4* 3.9  CL 102 109 107 107  CO2 22 23 26 24   GLUCOSE 321* 95 166* 92  BUN 45* 41* 32* 26*  CREATININE 3.93* 2.96* 2.35* 1.98*  CALCIUM 8.2* 8.0* 8.1* 7.9*  MG  --  2.3  --   --   AST 52* 47* 36 32  ALT 24 23 22 20   ALKPHOS 63 58 64 71  BILITOT 0.3 0.4 0.2* 0.4   ------------------------------------------------------------------------------------------------------------------ No results for input(s): CHOL, HDL, LDLCALC, TRIG, CHOLHDL, LDLDIRECT in the last 72 hours.  Lab Results  Component Value Date   HGBA1C 13.5 (H) 07/05/2018   ------------------------------------------------------------------------------------------------------------------ No results for input(s): TSH, T4TOTAL, T3FREE, THYROIDAB in the last 72 hours.  Invalid input(s): FREET3 ------------------------------------------------------------------------------------------------------------------ Recent Labs    07/06/18 0326 07/07/18 0130 07/07/18 1500 07/07/18 1530  VITAMINB12  --   --  824  --   FOLATE  --   --   --  15.6  FERRITIN 423* 343*  --   --   TIBC  --   --  156*  --   IRON  --   --  24*  --   RETICCTPCT  --   --   --  0.8    Coagulation profile No results for input(s): INR, PROTIME in the last 168 hours.  Recent Labs    07/06/18 0326  DDIMER 3.16*    Cardiac Enzymes No results for input(s): CKMB, TROPONINI, MYOGLOBIN in the last 168 hours.  Invalid input(s): CK ------------------------------------------------------------------------------------------------------------------ No results found for: BNP  Inpatient Medications  Scheduled Meds: . amLODipine  10 mg Oral Daily   And  . atorvastatin  40 mg Oral Daily  . bethanechol  10 mg Oral TID  . cloNIDine  0.3 mg Transdermal Weekly  . docusate sodium  100 mg Oral BID  .  feeding supplement  1 Container Oral TID BM  . heparin injection (subcutaneous)  5,000 Units Subcutaneous Q8H  . insulin aspart  0-15 Units Subcutaneous TID WC  . insulin aspart  0-5 Units Subcutaneous QHS  . insulin aspart  3 Units Subcutaneous TID WC  . metoCLOPramide (REGLAN) injection  10 mg Intravenous Q8H  . pantoprazole  40 mg Oral BID  . sodium chloride flush  3 mL Intravenous Q12H   Continuous Infusions:  PRN Meds:.acetaminophen, bisacodyl, guaiFENesin-dextromethorphan, HYDROcodone-acetaminophen, [START ON 07/09/2018] metoCLOPramide (REGLAN) injection, ondansetron **OR**  ondansetron (ZOFRAN) IV, polyethylene glycol  Micro Results Recent Results (from the past 240 hour(s))  Blood Culture (routine x 2)     Status: None (Preliminary result)   Collection Time: 07/05/18 10:49 AM  Result Value Ref Range Status   Specimen Description BLOOD RIGHT HAND  Final   Special Requests   Final    BOTTLES DRAWN AEROBIC AND ANAEROBIC Blood Culture adequate volume   Culture   Final    NO GROWTH 2 DAYS Performed at [facility], [place] [address] [place], [place] [address]    Report Status PENDING  Incomplete  SARS Coronavirus 2 [facility] order, Performed in [place] hospital lab)     Status: Abnormal   Collection Time: 07/05/18 10:51 AM  Result Value Ref Range Status   SARS Coronavirus 2 POSITIVE (A) NEGATIVE Final    Comment: RESULT CALLED TO, READ BACK BY AND VERIFIED WITH: Freddie Breech RN, AT 1241 07/05/18 BY D. VANHOOK (NOTE) If result is NEGATIVE SARS-CoV-2 target nucleic acids are NOT DETECTED. The SARS-CoV-2 RNA is generally detectable in upper and lower  respiratory specimens during the acute phase of infection. The lowest  concentration of SARS-CoV-2 viral copies this assay can detect is 250  copies / mL. A negative result does not preclude SARS-CoV-2 infection  and should not be used as the sole basis for treatment or other  patient management decisions.  A negative  result may occur with  improper specimen collection / handling, submission of specimen other  than nasopharyngeal swab, presence of viral mutation(s) within the  areas targeted by this assay, and inadequate number of viral copies  (<250 copies / mL). A negative result must be combined with clinical  observations, patient history, and epidemiological information. If result is POSITIVE SARS-CoV-2 target nucleic acids are  DETECTED. The SARS-CoV-2 RNA is generally detectable in upper and lower  respiratory specimens during the acute phase of infection.  Positive  results are indicative of active infection with SARS-CoV-2.  Clinical  correlation with patient history and other diagnostic information is  necessary to determine patient infection status.  Positive results do  not rule out bacterial infection or co-infection with other viruses. If result is PRESUMPTIVE POSTIVE SARS-CoV-2 nucleic acids MAY BE PRESENT.   A presumptive positive result was obtained on the submitted specimen  and confirmed on repeat testing.  While 2019 novel coronavirus  (SARS-CoV-2) nucleic acids may be present in the submitted sample  additional confirmatory testing may be necessary for epidemiological  and / or clinical management purposes  to differentiate between  SARS-CoV-2 and other Sarbecovirus currently known to infect humans.  If clinically indicated additional testing with an alternate test  methodology  [phone]) is advised. The SARS-CoV-2 RNA is generally  detectable in upper and lower respiratory specimens during the acute  phase of infection. The expected result is Negative. Fact Sheet for Patients:  StrictlyIdeas.no Fact Sheet for Healthcare Providers: BankingDealers.co.za This test is not yet approved or cleared by the [place] FDA and has been authorized for detection and/or diagnosis of SARS-CoV-2 by FDA under an Emergency Use Authorization  (EUA).  This EUA will remain in effect (meaning this test can be used) for the duration of the COVID-19 declaration under Section 564(b)(1) of the Act, 21 U.S.C. section 360bbb-3(b)(1), unless the authorization is terminated or revoked sooner. Performed at [facility], [place] [address] [place], [place] [address]   Blood Culture (routine x 2)     Status: None (  Preliminary result)   Collection Time: 07/05/18 10:54 AM  Result Value Ref Range Status   Specimen Description BLOOD LEFT WRIST  Final   Special Requests   Final    BOTTLES DRAWN AEROBIC AND ANAEROBIC Blood Culture adequate volume   Culture   Final    NO GROWTH 2 DAYS Performed at [facility], [address], [place], [place] [address]    Report Status PENDING  Incomplete    Radiology Reports Dg Chest Port 1 View  Result Date: 07/06/2018 CLINICAL DATA:  58 year old female with COVID-19. EXAM: PORTABLE CHEST 1 VIEW COMPARISON:  07/05/2018. FINDINGS: Portable AP upright view at 0508 hours. Mildly increased patchy and peripheral bilateral pulmonary opacity. Stable lung volumes. Stable cardiac size and mediastinal contours. Visualized tracheal air column is within normal limits. No pneumothorax or pleural effusion. Paucity of bowel gas in the upper abdomen. IMPRESSION: Mild radiographic progression of bilateral COVID-19 pneumonia. Electronically Signed   By: Genevie Ann M.D.   On: 07/06/2018 08:36   Dg Chest Port 1 View  Result Date: 07/05/2018 CLINICAL DATA:  Cough for 1 week. EXAM: PORTABLE CHEST 1 VIEW COMPARISON:  None. FINDINGS: The heart is enlarged. There is mild vascular congestion. Early perihilar infiltrates are not excluded, greater on the LEFT. No effusion or pneumothorax. Bones unremarkable. IMPRESSION: Cardiomegaly with mild vascular congestion. Early perihilar infiltrates are not excluded, greater on the LEFT. Electronically Signed   By: Staci Righter M.D.   On: 07/05/2018 11:17    Time Spent in minutes  35  minutes   Phillips Climes M.D on 07/08/2018 at 1:38 PM  Between 7am to 7pm - Pager - [phone]  After 7pm go to [email or web address] - password [facility]  [facility] -  Office  [phone]

## 2018-07-08 NOTE — Progress Notes (Signed)
Utilized Artist interpreter several times throughout the shift. Pt c/o nausea, bilateral flank pain and headache. Medicated with zofran and roxicodone x2, tylenol once. UOP WDL via indwelling catheter.

## 2018-07-08 NOTE — Progress Notes (Signed)
[employer] interpreter used several times thoroughout the shift to educate patient on new medications and about her plan of care.

## 2018-07-09 LAB — CBC WITH DIFFERENTIAL/PLATELET
Abs Immature Granulocytes: 0.43 10*3/uL — ABNORMAL HIGH (ref 0.00–0.07)
Basophils Absolute: 0 10*3/uL (ref 0.0–0.1)
Basophils Relative: 1 %
Eosinophils Absolute: 0.1 10*3/uL (ref 0.0–0.5)
Eosinophils Relative: 1 %
HCT: 25.3 % — ABNORMAL LOW (ref 36.0–46.0)
Hemoglobin: 7.7 g/dL — ABNORMAL LOW (ref 12.0–15.0)
Immature Granulocytes: 5 %
Lymphocytes Relative: 33 %
Lymphs Abs: 2.9 10*3/uL (ref 0.7–4.0)
MCH: 25.1 pg — ABNORMAL LOW (ref 26.0–34.0)
MCHC: 30.4 g/dL (ref 30.0–36.0)
MCV: 82.4 fL (ref 80.0–100.0)
Monocytes Absolute: 0.5 10*3/uL (ref 0.1–1.0)
Monocytes Relative: 5 %
Neutro Abs: 4.8 10*3/uL (ref 1.7–7.7)
Neutrophils Relative %: 55 %
Platelets: 322 10*3/uL (ref 150–400)
RBC: 3.07 MIL/uL — ABNORMAL LOW (ref 3.87–5.11)
RDW: 14 % (ref 11.5–15.5)
WBC: 8.8 10*3/uL (ref 4.0–10.5)
nRBC: 0.2 % (ref 0.0–0.2)

## 2018-07-09 LAB — C-REACTIVE PROTEIN: CRP: 5.8 mg/dL — ABNORMAL HIGH (ref <1.0)

## 2018-07-09 LAB — COMPREHENSIVE METABOLIC PANEL
ALT: 18 U/L (ref 0–44)
AST: 27 U/L (ref 15–41)
Albumin: 1.9 g/dL — ABNORMAL LOW (ref 3.5–5.0)
Alkaline Phosphatase: 66 U/L (ref 38–126)
Anion gap: 12 (ref 5–15)
BUN: 26 mg/dL — ABNORMAL HIGH (ref 6–20)
CO2: 22 mmol/L (ref 22–32)
Calcium: 8.2 mg/dL — ABNORMAL LOW (ref 8.9–10.3)
Chloride: 107 mmol/L (ref 98–111)
Creatinine, Ser: 2.17 mg/dL — ABNORMAL HIGH (ref 0.44–1.00)
GFR calc Af Amer: 28 mL/min — ABNORMAL LOW (ref >60)
GFR calc non Af Amer: 24 mL/min — ABNORMAL LOW (ref >60)
Glucose, Bld: 113 mg/dL — ABNORMAL HIGH (ref 70–99)
Potassium: 3.3 mmol/L — ABNORMAL LOW (ref 3.5–5.1)
Sodium: 141 mmol/L (ref 135–145)
Total Bilirubin: 0.3 mg/dL (ref 0.3–1.2)
Total Protein: 6.3 g/dL — ABNORMAL LOW (ref 6.5–8.1)

## 2018-07-09 LAB — GLUCOSE, CAPILLARY
Glucose-Capillary: 166 mg/dL — ABNORMAL HIGH (ref 70–99)
Glucose-Capillary: 245 mg/dL — ABNORMAL HIGH (ref 70–99)
Glucose-Capillary: 178 mg/dL — ABNORMAL HIGH (ref 70–99)
Glucose-Capillary: 88 mg/dL (ref 70–99)

## 2018-07-09 LAB — LIPASE, BLOOD: Lipase: 59 U/L — ABNORMAL HIGH (ref 11–51)

## 2018-07-09 MED ORDER — PROMETHAZINE HCL 25 MG PO TABS
12.5000 mg | ORAL_TABLET | Freq: Four times a day (QID) | ORAL | Status: DC | PRN
  Administered 2018-07-09: 12.5 mg via ORAL
  Filled 2018-07-09: qty 1

## 2018-07-09 MED ORDER — ADULT MULTIVITAMIN W/MINERALS CH
1.0000 | ORAL_TABLET | Freq: Every day | ORAL | Status: DC
  Administered 2018-07-10: 09:00:00 1 via ORAL
  Filled 2018-07-09 (×2): qty 1

## 2018-07-09 NOTE — Progress Notes (Signed)
PROGRESS NOTE  Kimberly Lane  CHE:[id number] DOB: 1960-09-10 DOA: 07/05/2018 PCP: Angelica Pou, MD   Brief Narrative: 58 y.o.femalewith medical history significant ofDM with h/o B BKA presenting with fever and cough with covid 19 infection, subsequently transferred to [facility].  Assessment & Plan: Principal Problem:   2019 novel coronavirus disease (COVID-19) Active Problems:   Type 2 diabetes with peripheral circulatory disorder, controlled (HCC)   PVD (peripheral vascular disease) ([place])   Acute renal failure (ARF) (HCC)  Acute hypoxic respiratory failure due to SARS CoV-2 infection:  - Wean patient to room air. If stable, may be eligible to discharge 5/19. Repeat CXR in AM given advanced infiltrates on previous exam - Monitor inflammatory markers. With improvement in hypoxemia, will not instituate investigational therapies at this time. With uncontrolled DM, strongly prefer to avoid steroids.   AKI and stage III CKD: Baseline creatinine 1.89, was elevated 3.93  on admission.  - Resolved with IV fluid. Creatinine back to baseline at 1.98 - avoid nephrotoxic medications.  PVD - S/P remote hx B amputations  IDT2DM: Reportedly, A1c 13.5- CBG well controlled, she is noncompliant with medication at home. - Continue with insulin sliding scale during hospital stay.  Nausea and vomiting: Likely due to covid infection and gastroparesis.  - Tolerating some po intake today, but not much and severely nauseated despite IV antiemetics.  - Add prn po phenergan.   Hypokalemia:  - Replace today and recheck.  HLD - Contatorvastatin 40 QD  Normocytic anemia of chronic renal disease: No bleeding noted. Hb 7.7. - Monitor, transfuse only for bleeding or hgb < 7.  Hypertension - Continue with norvasc, and clonidine.   DVT prophylaxis: Heparin 5k units q8h. With significant anemia and positive clinical trajectory, will not increase dosing.  Code Status: Full Family  Communication: None at bedside Disposition Plan: Home once stabilizing.  Consultants:   None  Procedures:   None  Antimicrobials:  None   Subjective: Arabic interpretor Mervat [id number] used throughout encounter. Patient complains of coughing persistently with white frothy sputum. No vomiting this morning but severe nausea.  Objective: Vitals:   07/09/18 0400 07/09/18 0800 07/09/18 1107 07/09/18 1200  BP:  (!) 160/62 (!) 179/69 (!) 141/58  Pulse:  (!) 53 69 (!) 58  Resp:      Temp: 97.8 F (36.6 C) 97.8 F (36.6 C)  97.8 F (36.6 C)  TempSrc: Oral     SpO2:  96% 97% 95%  Weight:      Height:        Intake/Output Summary (Last 24 hours) at 07/09/2018 1638 Last data filed at 07/09/2018 0700 Gross per 24 hour  Intake 480 ml  Output 800 ml  Net -320 ml   Filed Weights   07/05/18 1552 07/06/18 0415  Weight: 56.8 kg 56.3 kg    Gen: 58 y.o. female in no distress  Pulm: Non-labored breathing. Clear to auscultation bilaterally.  CV: Regular rate and rhythm. No murmur, rub, or gallop. No JVD, no pedal edema. GI: Abdomen soft, non-tender, non-distended, with normoactive bowel sounds. No organomegaly or masses felt. Ext: Warm, no deformities Skin: No rashes, lesions or ulcers Neuro: Alert and oriented. No focal neurological deficits. Psych: Judgement and insight appear normal. Mood & affect appropriate.   Data Reviewed: I have personally reviewed following labs and imaging studies  CBC: Recent Labs  Lab 07/05/18 1025 07/06/18 0326 07/07/18 0130 07/07/18 1530 07/08/18 0348 07/09/18 0406  WBC 6.4 7.7 8.5 8.4 9.6 8.8  NEUTROABS  3.8 4.1 4.6  --  5.1 4.8  HGB 9.4* 8.1* 7.1* 8.1* 7.9* 7.7*  HCT 30.7* 26.3* 23.0* 25.9* 25.3* 25.3*  MCV 85.3 83.2 83.0 82.5 82.1 82.4  PLT 226 232 226 273 284 505   Basic Metabolic Panel: Recent Labs  Lab 07/05/18 1025 07/06/18 0326 07/07/18 0130 07/08/18 0348 07/09/18 0406  NA 136 142 141 140 141  K 4.1 3.7 3.4* 3.9 3.3*  CL  102 109 107 107 107  CO2 22 23 26 24 22   GLUCOSE 321* 95 166* 92 113*  BUN 45* 41* 32* 26* 26*  CREATININE 3.93* 2.96* 2.35* 1.98* 2.17*  CALCIUM 8.2* 8.0* 8.1* 7.9* 8.2*  MG  --  2.3  --   --   --   PHOS  --  4.1  --   --   --    GFR: Estimated Creatinine Clearance: 22.1 mL/min (A) (by C-G formula based on SCr of 2.17 mg/dL (H)). Liver Function Tests: Recent Labs  Lab 07/05/18 1025 07/06/18 0326 07/07/18 0130 07/08/18 0348 07/09/18 0406  AST 52* 47* 36 32 27  ALT 24 23 22 20 18   ALKPHOS 63 58 64 71 66  BILITOT 0.3 0.4 0.2* 0.4 0.3  PROT 7.1 6.5 6.0* 5.6* 6.3*  ALBUMIN 2.1* 2.2* 1.8* 1.7* 1.9*   Recent Labs  Lab 07/09/18 0405  LIPASE 59*   No results for input(s): AMMONIA in the last 168 hours. Coagulation Profile: No results for input(s): INR, PROTIME in the last 168 hours. Cardiac Enzymes: No results for input(s): CKTOTAL, CKMB, CKMBINDEX, TROPONINI in the last 168 hours. BNP (last 3 results) No results for input(s): PROBNP in the last 8760 hours. HbA1C: No results for input(s): HGBA1C in the last 72 hours. CBG: Recent Labs  Lab 07/08/18 1700 07/08/18 2029 07/09/18 0805 07/09/18 1201 07/09/18 1521  GLUCAP 287* 121* 166* 245* 178*   Lipid Profile: No results for input(s): CHOL, HDL, LDLCALC, TRIG, CHOLHDL, LDLDIRECT in the last 72 hours. Thyroid Function Tests: No results for input(s): TSH, T4TOTAL, FREET4, T3FREE, THYROIDAB in the last 72 hours. Anemia Panel: Recent Labs    07/07/18 0130 07/07/18 1500 07/07/18 1530  VITAMINB12  --  824  --   FOLATE  --   --  15.6  FERRITIN 343*  --   --   TIBC  --  156*  --   IRON  --  24*  --   RETICCTPCT  --   --  0.8   Urine analysis: No results found for: COLORURINE, APPEARANCEUR, LABSPEC, PHURINE, GLUCOSEU, HGBUR, BILIRUBINUR, KETONESUR, PROTEINUR, UROBILINOGEN, NITRITE, LEUKOCYTESUR Recent Results (from the past 240 hour(s))  Blood Culture (routine x 2)     Status: None (Preliminary result)   Collection  Time: 07/05/18 10:49 AM  Result Value Ref Range Status   Specimen Description BLOOD RIGHT HAND  Final   Special Requests   Final    BOTTLES DRAWN AEROBIC AND ANAEROBIC Blood Culture adequate volume   Culture   Final    NO GROWTH 4 DAYS Performed at [facility], [place] [address], [place], [place] [address]    Report Status PENDING  Incomplete  SARS Coronavirus 2 [facility] order, Performed in [place] hospital lab)     Status: Abnormal   Collection Time: 07/05/18 10:51 AM  Result Value Ref Range Status   SARS Coronavirus 2 POSITIVE (A) NEGATIVE Final    Comment: RESULT CALLED TO, READ BACK BY AND VERIFIED WITH: Freddie Breech RN, AT [phone]  07/05/18 BY D. VANHOOK (NOTE) If result is NEGATIVE SARS-CoV-2 target nucleic acids are NOT DETECTED. The SARS-CoV-2 RNA is generally detectable in upper and lower  respiratory specimens during the acute phase of infection. The lowest  concentration of SARS-CoV-2 viral copies this assay can detect is 250  copies / mL. A negative result does not preclude SARS-CoV-2 infection  and should not be used as the sole basis for treatment or other  patient management decisions.  A negative result may occur with  improper specimen collection / handling, submission of specimen other  than nasopharyngeal swab, presence of viral mutation(s) within the  areas targeted by this assay, and inadequate number of viral copies  (<250 copies / mL). A negative result must be combined with clinical  observations, patient history, and epidemiological information. If result is POSITIVE SARS-CoV-2 target nucleic acids are  DETECTED. The SARS-CoV-2 RNA is generally detectable in upper and lower  respiratory specimens during the acute phase of infection.  Positive  results are indicative of active infection with SARS-CoV-2.  Clinical  correlation with patient history and other diagnostic information is  necessary to determine patient infection status.  Positive results do   not rule out bacterial infection or co-infection with other viruses. If result is PRESUMPTIVE POSTIVE SARS-CoV-2 nucleic acids MAY BE PRESENT.   A presumptive positive result was obtained on the submitted specimen  and confirmed on repeat testing.  While 2019 novel coronavirus  (SARS-CoV-2) nucleic acids may be present in the submitted sample  additional confirmatory testing may be necessary for epidemiological  and / or clinical management purposes  to differentiate between  SARS-CoV-2 and other Sarbecovirus currently known to infect humans.  If clinically indicated additional testing with an alternate test  methodology  [phone]) is advised. The SARS-CoV-2 RNA is generally  detectable in upper and lower respiratory specimens during the acute  phase of infection. The expected result is Negative. Fact Sheet for Patients:  StrictlyIdeas.no Fact Sheet for Healthcare Providers: BankingDealers.co.za This test is not yet approved or cleared by the [place] FDA and has been authorized for detection and/or diagnosis of SARS-CoV-2 by FDA under an Emergency Use Authorization (EUA).  This EUA will remain in effect (meaning this test can be used) for the duration of the COVID-19 declaration under Section 564(b)(1) of the Act, 21 U.S.C. section 360bbb-3(b)(1), unless the authorization is terminated or revoked sooner. Performed at [facility], [place] [address] [place], [place] [address]   Blood Culture (routine x 2)     Status: None (Preliminary result)   Collection Time: 07/05/18 10:54 AM  Result Value Ref Range Status   Specimen Description BLOOD LEFT WRIST  Final   Special Requests   Final    BOTTLES DRAWN AEROBIC AND ANAEROBIC Blood Culture adequate volume   Culture   Final    NO GROWTH 4 DAYS Performed at [facility], [place] [address], [place], [place] [address]    Report Status PENDING  Incomplete      Radiology  Studies: No results found.  Scheduled Meds: . amLODipine  10 mg Oral Daily   And  . atorvastatin  40 mg Oral Daily  . bethanechol  10 mg Oral TID  . cloNIDine  0.3 mg Transdermal Weekly  . Darbepoetin Alfa  40 mcg Subcutaneous Q7 days  . docusate sodium  100 mg Oral BID  . feeding supplement  1 Container Oral TID BM  . heparin injection (subcutaneous)  5,000 Units Subcutaneous  Q8H  . insulin aspart  0-15 Units Subcutaneous TID WC  . insulin aspart  0-5 Units Subcutaneous QHS  . insulin aspart  3 Units Subcutaneous TID WC  . multivitamin with minerals  1 tablet Oral Daily  . pantoprazole  40 mg Oral BID  . sodium chloride flush  3 mL Intravenous Q12H   Continuous Infusions:   LOS: 4 days   Time spent: 25 minutes.  Patrecia Pour, MD [facility] [email or web address] Password [facility] 07/09/2018, 4:38 PM

## 2018-07-09 NOTE — Progress Notes (Signed)
Occupational Therapy Evaluation Patient Details Name: Kimberly Lane MRN: [id number] DOB: 1960-12-06 Today's Date: 07/09/2018    History of Present Illness 58 y.o. female admitted on 07/05/18 for SOB and fever.  Dx with COVID 19.  Initially she was on 2 L O2 [place], but at time of PT/OT eval she is on RA.  Pt with other significant PMH of DM, PVD s/p bil LE amputations, anemia, HTN.     Clinical Impression   Seen as cotreat with PT. Increased time to figure out correct interpreter language for pt. "Audio" only [employer] interpreter  used. Chose Arabic, then [employer]. Through use of interpreter, pt states her husband and another family member lifted her wc to go in/out of house. Appears pt is able to complete her ADL with occasional A of her husband. Pt is nauseated and states she feels very weak. At this time recommend follow up [place]. Will follow acutely to facilitate safe DC home.   Follow Up Recommendations  Home health OT;Supervision/Assistance - 24 hour    Equipment Recommendations  None recommended by OT    Recommendations for Other Services       Precautions / Restrictions Precautions Precautions: Fall Precaution Comments: due to R BKA, L AKA Required Braces or Orthoses: Other Brace Other Brace: pt reports she only has a R prosthesis, but rarely uses it as she doesn't leave the house much (and this was the only time she would wear it is out of the house).       Mobility Bed Mobility Overal bed mobility: Needs Assistance Bed Mobility: Supine to Sit     Supine to sit: Modified independent (Device/Increase time)     General bed mobility comments: supine to long sitting in the bed mod I with use of hands.  Transfers Overall transfer level: Needs assistance   Transfers: Lateral/Scoot Transfers          Lateral/Scoot Transfers: Min assist;+2 safety/equipment General transfer comment: Pt able to trasnsfer into drop arm recliner chair high to low transfer with min two  person assist for safety.  She self reports feeling weak.      Balance Overall balance assessment: Needs assistance   Sitting balance-Leahy Scale: Good                                     ADL either performed or assessed with clinical judgement   ADL Overall ADL's : Needs assistance/impaired   Eating/Feeding Details (indicate cue type and reason): poor appetite; states she can't taste anything Grooming: Set up;Sitting   Upper Body Bathing: Set up;Sitting   Lower Body Bathing: Minimal assistance;Bed level   Upper Body Dressing : Set up;Sitting;Bed level   Lower Body Dressing: Moderate assistance;Bed level               Functional mobility during ADLs: Minimal assistance(lateral scoot) General ADL Comments: States she is very weak     Museum/gallery curator      Pertinent Vitals/Pain Pain Assessment: Faces Faces Pain Scale: Hurts little more Pain Location: generalized; stomach Pain Descriptors / Indicators: Guarding;Grimacing Pain Intervention(s): Limited activity within patient's tolerance     Hand Dominance Right   Extremity/Trunk Assessment Upper Extremity Assessment Upper Extremity Assessment: Generalized weakness   Lower Extremity Assessment Lower Extremity Assessment: Defer to PT evaluation RLE Deficits / Details: R BKA LLE  Deficits / Details: L AKA   Cervical / Trunk Assessment Cervical / Trunk Assessment: Normal   Communication Communication Communication: Prefers language other than English(sudaneese (an arabic dialect))   Cognition Arousal/Alertness: Awake/alert Behavior During Therapy: WFL for tasks assessed/performed Overall Cognitive Status: Difficult to assess                                 General Comments: most likely at [place]; answering quesitons appropriately   General Comments  Attempted to educate pt on use of incentive spirometer    Exercises     Shoulder Instructions       Home Living Family/patient expects to be discharged to:: Private residence Living Arrangements: Spouse/significant other Available Help at Discharge: Family Type of Home: House Home Access: Stairs to enter     Home Layout: Two level     Bathroom Shower/Tub: International aid/development worker Accessibility: Yes How Accessible: Accessible via wheelchair Home Equipment: Wheelchair - manual;Shower seat   Additional Comments: R LE prosthesis, does not really use it because she does not go out.        Prior Functioning/Environment Level of Independence: Needs assistance  Gait / Transfers Assistance Needed: husband helps, 2 people have to help her in [facility] in and out of house, she does not go out much, pushes WC independently ADL's / Homemaking Assistance Needed: WC to toilet transfers independent, at times help from husband to bathe, has bars in the tub Communication / Swallowing Assistance Needed: speaks sudaneese, Arabic dialect          OT Problem List: Decreased strength;Decreased activity tolerance;Cardiopulmonary status limiting activity;Pain      OT Treatment/Interventions: Self-care/ADL training;Therapeutic exercise;Energy conservation;DME and/or AE instruction;Therapeutic activities;Patient/family education    OT Goals(Current goals can be found in the care plan section) Acute Rehab OT Goals Patient Stated Goal: to get better so she can go home, get stronger as she feels weak OT Goal Formulation: With patient Time For Goal Achievement: 07/23/18 Potential to Achieve Goals: Good  OT Frequency: Min 3X/week   Barriers to D/C:            Co-evaluation PT/OT/SLP Co-Evaluation/Treatment: Yes Reason for Co-Treatment: To address functional/ADL transfers;Other (comment)(language interpreter)   OT goals addressed during session: ADL's and self-care      AM-PAC OT "6 Clicks" Daily Activity     Outcome Measure Help from another person eating meals?: None Help from another  person taking care of personal grooming?: A Little Help from another person toileting, which includes using toliet, bedpan, or urinal?: A Little Help from another person bathing (including washing, rinsing, drying)?: A Little Help from another person to put on and taking off regular upper body clothing?: A Little Help from another person to put on and taking off regular lower body clothing?: A Little 6 Click Score: 19   End of Session Nurse Communication: Mobility status  Activity Tolerance: Patient tolerated treatment well Patient left: in chair;with call bell/phone within reach  OT Visit Diagnosis: Other abnormalities of gait and mobility (R26.89);Muscle weakness (generalized) (M62.81);Pain Pain - part of body: (stomach)                Time: 7262-0355 OT Time Calculation (min): 66 min Charges:  OT General Charges $OT Visit: 1 Visit OT Evaluation $OT Eval Moderate Complexity: 1 Mod OT Treatments $Self Care/Home Management : 8-22 mins  Chauna Osoria, OT/L   Acute OT Clinical  Specialist [place] Pager [phone] Office [phone]   [facility] 07/09/2018, 5:17 PM

## 2018-07-09 NOTE — Care Management (Signed)
CM provided an update on the patients progress to Georgeanne Nim, SW (PACE coordinator). PACE is working closely with the CM for the patients TOC needs. The PACE coordinator request the CM team to arrange PTAR transportation once the patient is medically stable to transition home. CM team will continue to follow.   Midge Minium RN, BSN, NCM-BC, ACM-RN [phone]

## 2018-07-09 NOTE — Evaluation (Signed)
Physical Therapy Evaluation Patient Details Name: Kimberly Lane MRN: [id number] DOB: 05-Aug-1960 Today's Date: 07/09/2018   History of Present Illness  58 y.o. female admitted on 07/05/18 for SOB and fever.  Dx with COVID 19.  Initially she was on 2 L O2 [place], but at time of PT/OT eval she is on RA.  Pt with other significant PMH of DM, PVD s/p bil LE amputations, anemia, HTN.    Clinical Impression  Seen in conjunction with OT and significant time spent procuring the correct interpreter, so only Evaluation charged.  Pt needed min assist to preform lateral transfer into a drop arm recliner chair.  She is used to preforming transfers to her WC at home (without prostheses) at a mod I level.  She self reports feeling weaker than usual.   PT to follow acutely for deficits listed below.      Follow Up Recommendations No PT follow up;Supervision for mobility/OOB    Equipment Recommendations  None recommended by PT    Recommendations for Other Services   NA    Precautions / Restrictions Precautions Precautions: Fall Precaution Comments: due to R BKA, L AKA Required Braces or Orthoses: Other Brace Other Brace: pt reports she only has a R prosthesis, but rarely uses it as she doesn't leave the house much (and this was the only time she would wear it is out of the house).       Mobility  Bed Mobility Overal bed mobility: Needs Assistance Bed Mobility: Supine to Sit     Supine to sit: Modified independent (Device/Increase time)     General bed mobility comments: supine to long sitting in the bed mod I with use of hands.  Transfers Overall transfer level: Needs assistance   Transfers: Lateral/Scoot Transfers          Lateral/Scoot Transfers: Min assist;+2 safety/equipment General transfer comment: Pt able to trasnsfer into drop arm recliner chair high to low transfer with min two person assist for safety.  She self reports feeling weak.              Pertinent Vitals/Pain  Pain Assessment: Faces Faces Pain Scale: Hurts little more Pain Location: generalized Pain Descriptors / Indicators: Guarding;Grimacing Pain Intervention(s): Limited activity within patient's tolerance;Monitored during session;Repositioned    Home Living Family/patient expects to be discharged to:: Private residence Living Arrangements: Spouse/significant other Available Help at Discharge: Family Type of Home: House Home Access: Stairs to enter     Home Layout: Two level Home Equipment: Wheelchair - manual;Shower seat Additional Comments: R LE prosthesis, does not really use it because she does not go out.      Prior Function Level of Independence: Needs assistance   Gait / Transfers Assistance Needed: husband helps, 2 people have to help her in [facility] in and out of house, she does not go out much, pushes WC independently  ADL's / Homemaking Assistance Needed: WC to toilet transfers independent, at times help from husband to bathe, has bars in the tub        Extremity/TrunkAssessment   Upper Extremity Assessment Upper Extremity Assessment: Defer to OT evaluation    Lower Extremity Assessment Lower Extremity Assessment: RLE deficits/detail;LLE deficits/detail RLE Deficits / Details: R BKA LLE Deficits / Details: L AKA    Cervical / Trunk Assessment Cervical / Trunk Assessment: Normal  Communication   Communication: Prefers language other than English(sudaneese (an arabic dialect))  Cognition Arousal/Alertness: Awake/alert Behavior During Therapy: WFL for tasks assessed/performed Overall Cognitive Status: Difficult  to assess                                        General Comments General comments (skin integrity, edema, etc.): Educated Re: call bell use, incentive spirometer use, BSC with assit from staff for BM, foley catheter collecting her urine.          Assessment/Plan    PT Assessment Patient needs continued PT services  PT Problem List  Decreased strength;Decreased activity tolerance;Decreased balance;Decreased mobility;Decreased knowledge of precautions;Cardiopulmonary status limiting activity       PT Treatment Interventions DME instruction;Functional mobility training;Therapeutic activities;Therapeutic exercise;Balance training;Patient/family education;Wheelchair mobility training    PT Goals (Current goals can be found in the Care Plan section)  Acute Rehab PT Goals Patient Stated Goal: to get better so she can go home, get stronger as she feels weak PT Goal Formulation: With patient Time For Goal Achievement: 07/23/18 Potential to Achieve Goals: Good    Frequency Min 5X/week        Co-evaluation PT/OT/SLP Co-Evaluation/Treatment: Yes Reason for Co-Treatment: To address functional/ADL transfers;Other (comment)(difficulty aquiring an interpreter) PT goals addressed during session: Mobility/safety with mobility;Balance         AM-PAC PT "6 Clicks" Mobility  Outcome Measure Help needed turning from your back to your side while in a flat bed without using bedrails?: A Little Help needed moving from lying on your back to sitting on the side of a flat bed without using bedrails?: None Help needed moving to and from a bed to a chair (including a wheelchair)?: A Little Help needed standing up from a chair using your arms (e.g., wheelchair or bedside chair)?: Total Help needed to walk in hospital room?: Total Help needed climbing 3-5 steps with a railing? : Total 6 Click Score: 13    End of Session   Activity Tolerance: Patient tolerated treatment well Patient left: in chair;with call bell/phone within reach;Other (comment)(no chair alarms available- RN aware) Nurse Communication: Mobility status;Other (comment)(no chair alarms available) PT Visit Diagnosis: Muscle weakness (generalized) (M62.81)    Time: 1448-1856 PT Time Calculation (min) (ACUTE ONLY): 66 min   Charges:          Wells Guiles B.  Keegen Heffern, PT, DPT  Acute Rehabilitation #[phone] pager #[phone] office   PT Evaluation $PT Eval Moderate Complexity: 1 Mod          07/09/2018, 1:40 PM

## 2018-07-09 NOTE — Progress Notes (Addendum)
Initial Nutrition Assessment RD working remotely.  DOCUMENTATION CODES:   Not applicable  INTERVENTION:    Continue Boost Breeze po TID, each supplement provides 250 kcal and 9 grams of protein   Multivitamin daily   When diet is advanced, patient will receive:  Magic cup BID with lunch and supper meals, each supplement provides 290 kcal and 9 grams of protein  Vital Cuisine Shake once daily with breakfast, each supplement provides 520 kcal and 22 grams of protein   NUTRITION DIAGNOSIS:   Inadequate oral intake related to nausea, decreased appetite as evidenced by meal completion < 25%.  GOAL:   Patient will meet greater than or equal to 90% of their needs  MONITOR:   PO intake, Supplement acceptance  REASON FOR ASSESSMENT:   Malnutrition Screening Tool    ASSESSMENT:   58 yo female with PMH of DM-2, PVD, R BKA and L AKA who was admitted with fever, SOB, cough. COVID-19 positive.   Patient with nausea, vomiting, and inability to tolerate PO's on admission. Diet was downgraded to clear liquids on 5/17. Intake of meals has been very poor. Patient is being offered Boost Breeze supplement TID. She refused it this morning.   Labs reviewed. Potassium 3.3 (L), BUN 26 (H), creatinine 2.17 (H) CBG's: 166-245  Medications reviewed and include Novolog, Reglan.   Unsure of usual weight, no weight encounters available for review.   Patient is at nutrition risk, given acute illness with nausea and poor intake since admission.  NUTRITION - FOCUSED PHYSICAL EXAM:  unable to complete  Diet Order:   Diet Order            Diet clear liquid Room service appropriate? Yes; Fluid consistency: Thin  Diet effective now              EDUCATION NEEDS:   No education needs have been identified at this time  Skin:  Skin Assessment: Reviewed RN Assessment  Last BM:  5/17  Height:   Ht Readings from Last 1 Encounters:  07/08/18 5\' 2"  (1.575 m)    Weight:   Wt  Readings from Last 1 Encounters:  07/06/18 56.3 kg    Ideal Body Weight:  42.8 kg(adjusted for amputations)  BMI:  26.5 (adjusted for amputations)  Estimated Nutritional Needs:   Kcal:  1450-1650  Protein:  75-85 gm  Fluid:  >/= 1.5 L    Molli Barrows, RD, LDN, [place] Pager [phone] After Hours Pager [phone]

## 2018-07-09 NOTE — Progress Notes (Signed)
Communicated via [place] several times throughout the shift. Questions encouraged and answered. Patient verbalized understanding of medications and plan.

## 2018-07-10 ENCOUNTER — Inpatient Hospital Stay (HOSPITAL_COMMUNITY): Payer: Medicare (Managed Care)

## 2018-07-10 ENCOUNTER — Encounter (HOSPITAL_COMMUNITY): Payer: Self-pay | Admitting: Emergency Medicine

## 2018-07-10 LAB — COMPREHENSIVE METABOLIC PANEL
ALT: 20 U/L (ref 0–44)
AST: 34 U/L (ref 15–41)
Albumin: 2.1 g/dL — ABNORMAL LOW (ref 3.5–5.0)
Alkaline Phosphatase: 71 U/L (ref 38–126)
Anion gap: 8 (ref 5–15)
BUN: 23 mg/dL — ABNORMAL HIGH (ref 6–20)
CO2: 22 mmol/L (ref 22–32)
Calcium: 8.3 mg/dL — ABNORMAL LOW (ref 8.9–10.3)
Chloride: 110 mmol/L (ref 98–111)
Creatinine, Ser: 2.16 mg/dL — ABNORMAL HIGH (ref 0.44–1.00)
GFR calc Af Amer: 28 mL/min — ABNORMAL LOW (ref >60)
GFR calc non Af Amer: 24 mL/min — ABNORMAL LOW (ref >60)
Glucose, Bld: 299 mg/dL — ABNORMAL HIGH (ref 70–99)
Potassium: 3.1 mmol/L — ABNORMAL LOW (ref 3.5–5.1)
Sodium: 140 mmol/L (ref 135–145)
Total Bilirubin: 0.3 mg/dL (ref 0.3–1.2)
Total Protein: 6.6 g/dL (ref 6.5–8.1)

## 2018-07-10 LAB — CULTURE, BLOOD (ROUTINE X 2)
Culture: NO GROWTH
Culture: NO GROWTH
Special Requests: ADEQUATE
Special Requests: ADEQUATE

## 2018-07-10 LAB — CBC WITH DIFFERENTIAL/PLATELET
Abs Immature Granulocytes: 0.48 10*3/uL — ABNORMAL HIGH (ref 0.00–0.07)
Basophils Absolute: 0 10*3/uL (ref 0.0–0.1)
Basophils Relative: 0 %
Eosinophils Absolute: 0.1 10*3/uL (ref 0.0–0.5)
Eosinophils Relative: 1 %
HCT: 23.9 % — ABNORMAL LOW (ref 36.0–46.0)
Hemoglobin: 7.3 g/dL — ABNORMAL LOW (ref 12.0–15.0)
Immature Granulocytes: 5 %
Lymphocytes Relative: 22 %
Lymphs Abs: 1.9 10*3/uL (ref 0.7–4.0)
MCH: 25.3 pg — ABNORMAL LOW (ref 26.0–34.0)
MCHC: 30.5 g/dL (ref 30.0–36.0)
MCV: 83 fL (ref 80.0–100.0)
Monocytes Absolute: 0.5 10*3/uL (ref 0.1–1.0)
Monocytes Relative: 6 %
Neutro Abs: 5.9 10*3/uL (ref 1.7–7.7)
Neutrophils Relative %: 66 %
Platelets: 324 10*3/uL (ref 150–400)
RBC: 2.88 MIL/uL — ABNORMAL LOW (ref 3.87–5.11)
RDW: 14.4 % (ref 11.5–15.5)
WBC: 9 10*3/uL (ref 4.0–10.5)
nRBC: 0.2 % (ref 0.0–0.2)

## 2018-07-10 LAB — GLUCOSE, CAPILLARY
Glucose-Capillary: 290 mg/dL — ABNORMAL HIGH (ref 70–99)
Glucose-Capillary: 145 mg/dL — ABNORMAL HIGH (ref 70–99)

## 2018-07-10 LAB — C-REACTIVE PROTEIN: CRP: 3.6 mg/dL — ABNORMAL HIGH (ref <1.0)

## 2018-07-10 MED ORDER — PROMETHAZINE HCL 12.5 MG PO TABS: 12.5000 mg | ORAL_TABLET | Freq: Four times a day (QID) | ORAL | 0 refills | Status: DC | PRN

## 2018-07-10 NOTE — Care Management (Signed)
Case manager spoke with bedside RN CIA, to discuss patient being ready for discharge. Case Manager called [facility] at 1:30pm to arrange for transport home, Patient is number 6 in line from [facility]. CM updated bedside Nurse and called Benita with [facility] to update her. Medical Necessity Form and Demographic sheet have been printed to the unit printer.     Ricki Miller RN BSN Case Manager [phone]

## 2018-07-10 NOTE — Progress Notes (Signed)
Pt discharged home via [facility].  

## 2018-07-10 NOTE — Progress Notes (Addendum)
At 5:12am the patient was found to be sitting on the floor. When I entered the room, she grabbed her blanked, bunched it up and laid down placing her head on it like a pillow. Her IV was noted to be pulled out. I utilized the [employer] for an [place] audio interpreter to communicate with the patient about what was going on. The patient was very frustrated with the fact that there is such a language barrier and the interpreters even have a difficult understanding her dialect at times. I attempted to get the patient to understand that she should not lay on the floor, but rather sit in the chair or lay in the bed. After much communication with the interpreter, it was determined that the patient placed herself on the floor and initially refused to get in the bed until she goes home. That interpreter told me that the patient "does not like this interpreter and that she has been very hateful and rude". I then thanked the interpreter and ended the communication session. I dialed another interpreter and utilized her to let the patient know that we are here for her and that it would be best if she would allow [place] to help her get back in the bed and bathe her. She initially refused. I asked her if she would like for me to give her some time to think about it and she said yes. I gave her space for about 10 minutes. She then was agreeable to allow me, the charge nurse, and a float nurse to guide her back to bed. We then bathed her, changed her bed and dressed her in a fresh gown. She then began trying to speak to me in her native language. I utilized the interpreter again, this time it was Voice Interpreter [phone]. The patient requested that her husband speak for her because she was exhausted and her voice was horse. The interpreter provided a three way call with the patient's husband. Of his own doing, he encouraged her to be patient with the healing process, rest, and allow the doctors and nurses to  take care of her. He agreed. I re-oriented the patient to the safety features of the room including the call bell and reinforced the importance of not getting out of the bed and laying on the floor. She does refuse a new IV site at this time.    [employer] Voice Interpreter [phone] will be happy to offer her services at any time as she was able to interpret with the correct dialect.  The patient's [employer] is actually [employer]. I attempted to utilize a [place] interpreter x5 with no success throughout this shift.

## 2018-07-10 NOTE — Discharge Summary (Signed)
Physician Discharge Summary  Tymia Streb NTI:[id number] DOB: 17-Jan-1961 DOA: 07/05/2018  PCP: Angelica Pou, MD  Admit date: 07/05/2018 Discharge date: 07/10/2018  Admitted From: Home Disposition: Home   Recommendations for Outpatient Follow-up:  1. Follow up with PCP in 1-2 weeks 2. Please obtain BMP/CBC in one week  Home Health: None Equipment/Devices: None Discharge Condition: Stable CODE STATUS: Full Diet recommendation: Carb-modified, heart healthy  Brief/Interim Summary: 58 y.o.femalewith medical history significant ofDM with h/o B BKA presenting with fever and cough with covid 19 infection, subsequently transferred to [facility].  Discharge Diagnoses:  Principal Problem:   2019 novel coronavirus disease (COVID-19) Active Problems:   Type 2 diabetes with peripheral circulatory disorder, controlled (HCC)   PVD (peripheral vascular disease) ([place])   Acute renal failure (ARF) (HCC)  Acute hypoxic respiratory failure due to SARS CoV-2 infection: Has been weaned to room air for >36 hours and stable. CXR personally reviewed this AM demonstrates slightly improved aeration. Inflammatory markers showing sustained improvement.    AKI and stage III CKD: Baseline creatinine preumably 1.89, was elevated 3.93on admission.  - Resolved with IV fluid. Will need recheck BMP at follow up. -Avoid nephrotoxic medications.  PVD - S/P remote hx right BKA, left AKA.   IDT2DM: HbA1c 13.5%- CBG well controlled, she is noncompliant with medication at home. - Recommend restart home medications as similar regimen here provided fair control.  Nausea and vomiting: Likely due to covid infection and gastroparesis.  - Tolerating some po intake, improved with antiemetics.   Possible unwitnessed fall while hospitalized: Pt very unclear historian, nonambulatory at baseline with bilateral amputations. No obvious trauma.   Hypokalemia:  - At follow up will need recheck.  HLD -  Contatorvastatin 40 QD  Normocytic anemia of chronic renal disease: No bleeding noted. Aranesp given while admitted - Monitor CBC at follow up  Hypertension - Continue with norvasc, and clonidine.  Discharge Instructions Discharge Instructions    Diet Carb Modified   Complete by:  As directed    Discharge instructions   Complete by:  As directed    You will continue to feel better very very slowly as you recover from covid. You are no longer requiring oxygen and labs have shown sustained improvement in inflammation. You are able to tolerate oral medications and liquids, and will be given a prescription for nausea medication at discharge. It is important to follow up with your doctor soon to continue treatment for diabetes or to return for care if your symptoms worsen.   Increase activity slowly   Complete by:  As directed      Allergies as of 07/10/2018      Reactions   Ace Inhibitors Cough   Sulfa Antibiotics Nausea And Vomiting   Tuberculin Tests Other (See Comments)   unk listed on paperwork      Medication List    TAKE these medications   acetaminophen 500 MG tablet Commonly known as:  TYLENOL Take 500 mg by mouth 3 (three) times daily as needed for mild pain.   amLODipine-atorvastatin 10-40 MG tablet Commonly known as:  CADUET Take 1 tablet by mouth daily.   aspirin EC 81 MG tablet Take 81 mg by mouth daily.   Basaglar KwikPen 100 UNIT/ML Sopn Inject 15 Units into the skin daily.   cloNIDine 0.3 mg/24hr patch Commonly known as:  CATAPRES - Dosed in mg/24 hr Place 0.3 mg onto the skin once a week. For blood pressure   furosemide 20 MG tablet Commonly  known as:  LASIX Take 20 mg by mouth every morning. For fluid retention   promethazine 12.5 MG tablet Commonly known as:  PHENERGAN Take 1 tablet (12.5 mg total) by mouth every 6 (six) hours as needed for refractory nausea / vomiting.   sennosides-docusate sodium 8.6-50 MG tablet Commonly known as:   SENOKOT-S Take 1 tablet by mouth at bedtime. For constipation   spironolactone 25 MG tablet Commonly known as:  ALDACTONE Take 25 mg by mouth daily. Blood pressure   Vitamin D3 50 MCG (2000 UT) Tabs Take 1 tablet by mouth daily.      Follow-up Information    Angelica Pou, MD. Schedule an appointment as soon as possible for a visit in 1 week(s).   Specialty:  Internal Medicine Contact information: [place] [address] [phone]          Allergies  Allergen Reactions  . Ace Inhibitors Cough  . Sulfa Antibiotics Nausea And Vomiting  . Tuberculin Tests Other (See Comments)    unk listed on paperwork    Consultations:  None  Procedures/Studies: Dg Chest Port 1 View  Result Date: 07/10/2018 CLINICAL DATA:  Acute respiratory disease due to COVID-19 EXAM: PORTABLE CHEST 1 VIEW COMPARISON:  Four days ago FINDINGS: Cardiomegaly and vascular pedicle widening, stable. Patchy bilateral airspace disease with questionable improvement on the right. No Kerley lines, effusion, or pneumothorax. IMPRESSION: Stable or mildly improved bilateral airspace disease. Electronically Signed   By: Monte Fantasia M.D.   On: 07/10/2018 09:16   Dg Chest Port 1 View  Result Date: 07/06/2018 CLINICAL DATA:  58 year old female with COVID-19. EXAM: PORTABLE CHEST 1 VIEW COMPARISON:  07/05/2018. FINDINGS: Portable AP upright view at 0508 hours. Mildly increased patchy and peripheral bilateral pulmonary opacity. Stable lung volumes. Stable cardiac size and mediastinal contours. Visualized tracheal air column is within normal limits. No pneumothorax or pleural effusion. Paucity of bowel gas in the upper abdomen. IMPRESSION: Mild radiographic progression of bilateral COVID-19 pneumonia. Electronically Signed   By: Genevie Ann M.D.   On: 07/06/2018 08:36   Dg Chest Port 1 View  Result Date: 07/05/2018 CLINICAL DATA:  Cough for 1 week. EXAM: PORTABLE CHEST 1 VIEW COMPARISON:  None.  FINDINGS: The heart is enlarged. There is mild vascular congestion. Early perihilar infiltrates are not excluded, greater on the LEFT. No effusion or pneumothorax. Bones unremarkable. IMPRESSION: Cardiomegaly with mild vascular congestion. Early perihilar infiltrates are not excluded, greater on the LEFT. Electronically Signed   By: Staci Righter M.D.   On: 07/05/2018 11:17   Subjective: No fevers, no trouble breathing or pain. Was found on the ground yesterday without trauma, denies falling or having pain afterward. Telephonic [name] Arabic interpretor used throughout the encounter, [phone], though the patient is not willing to speak very loudly or clearly. She is a very poor historian. She is tolerating liquids with sustained nausea but no vomiting today. Wants to go home badly.   Discharge Exam: Vitals:   07/10/18 0800 07/10/18 0831  BP:  (!) 150/51  Pulse:    Resp:    Temp: 98 F (36.7 C)   SpO2:     General: Frail female in no distress Cardiovascular: RRR, S1/S2 +, no rubs, no gallops Respiratory: CTA bilaterally, no wheezing, no rhonchi Abdominal: Soft, NT, ND, bowel sounds + Extremities: BL LE amputations, stable.  Labs: BNP (last 3 results) No results for input(s): BNP in the last 8760 hours. Basic Metabolic Panel: Recent Labs  Lab  07/06/18 0326 07/07/18 0130 07/08/18 0348 07/09/18 0406 07/10/18 0725  NA 142 141 140 141 140  K 3.7 3.4* 3.9 3.3* 3.1*  CL 109 107 107 107 110  CO2 23 26 24 22 22   GLUCOSE 95 166* 92 113* 299*  BUN 41* 32* 26* 26* 23*  CREATININE 2.96* 2.35* 1.98* 2.17* 2.16*  CALCIUM 8.0* 8.1* 7.9* 8.2* 8.3*  MG 2.3  --   --   --   --   PHOS 4.1  --   --   --   --    Liver Function Tests: Recent Labs  Lab 07/06/18 0326 07/07/18 0130 07/08/18 0348 07/09/18 0406 07/10/18 0725  AST 47* 36 32 27 34  ALT 23 22 20 18 20   ALKPHOS 58 64 71 66 71  BILITOT 0.4 0.2* 0.4 0.3 0.3  PROT 6.5 6.0* 5.6* 6.3* 6.6  ALBUMIN 2.2* 1.8* 1.7* 1.9* 2.1*    Recent Labs  Lab 07/09/18 0405  LIPASE 59*   CBC: Recent Labs  Lab 07/06/18 0326 07/07/18 0130 07/07/18 1530 07/08/18 0348 07/09/18 0406 07/10/18 0725  WBC 7.7 8.5 8.4 9.6 8.8 9.0  NEUTROABS 4.1 4.6  --  5.1 4.8 5.9  HGB 8.1* 7.1* 8.1* 7.9* 7.7* 7.3*  HCT 26.3* 23.0* 25.9* 25.3* 25.3* 23.9*  MCV 83.2 83.0 82.5 82.1 82.4 83.0  PLT 232 226 273 284 322 324   CBG: Recent Labs  Lab 07/09/18 1201 07/09/18 1521 07/09/18 2111 07/10/18 0806 07/10/18 1220  GLUCAP 245* 178* 88 290* 145*   Anemia work up Recent Labs    07/07/18 1500 07/07/18 1530  VITAMINB12 824  --   FOLATE  --  15.6  TIBC 156*  --   IRON 24*  --   RETICCTPCT  --  0.8    Microbiology Recent Results (from the past 240 hour(s))  Blood Culture (routine x 2)     Status: None   Collection Time: 07/05/18 10:49 AM  Result Value Ref Range Status   Specimen Description BLOOD RIGHT HAND  Final   Special Requests   Final    BOTTLES DRAWN AEROBIC AND ANAEROBIC Blood Culture adequate volume   Culture   Final    NO GROWTH 5 DAYS Performed at [facility], [address] [place], [place] [address]    Report Status 07/10/2018 FINAL  Final  SARS Coronavirus 2 [facility] order, Performed in [place] hospital lab)     Status: Abnormal   Collection Time: 07/05/18 10:51 AM  Result Value Ref Range Status   SARS Coronavirus 2 POSITIVE (A) NEGATIVE Final    Comment: RESULT CALLED TO, READ BACK BY AND VERIFIED WITH: Freddie Breech RN, AT 1241 07/05/18 BY D. VANHOOK (NOTE) If result is NEGATIVE SARS-CoV-2 target nucleic acids are NOT DETECTED. The SARS-CoV-2 RNA is generally detectable in upper and lower  respiratory specimens during the acute phase of infection. The lowest  concentration of SARS-CoV-2 viral copies this assay can detect is 250  copies / mL. A negative result does not preclude SARS-CoV-2 infection  and should not be used as the sole basis for treatment or other  patient management  decisions.  A negative result may occur with  improper specimen collection / handling, submission of specimen other  than nasopharyngeal swab, presence of viral mutation(s) within the  areas targeted by this assay, and inadequate number of viral copies  (<250 copies / mL). A negative result must be combined with clinical  observations, patient history, and  epidemiological information. If result is POSITIVE SARS-CoV-2 target nucleic acids are  DETECTED. The SARS-CoV-2 RNA is generally detectable in upper and lower  respiratory specimens during the acute phase of infection.  Positive  results are indicative of active infection with SARS-CoV-2.  Clinical  correlation with patient history and other diagnostic information is  necessary to determine patient infection status.  Positive results do  not rule out bacterial infection or co-infection with other viruses. If result is PRESUMPTIVE POSTIVE SARS-CoV-2 nucleic acids MAY BE PRESENT.   A presumptive positive result was obtained on the submitted specimen  and confirmed on repeat testing.  While 2019 novel coronavirus  (SARS-CoV-2) nucleic acids may be present in the submitted sample  additional confirmatory testing may be necessary for epidemiological  and / or clinical management purposes  to differentiate between  SARS-CoV-2 and other Sarbecovirus currently known to infect humans.  If clinically indicated additional testing with an alternate test  methodology  [phone]) is advised. The SARS-CoV-2 RNA is generally  detectable in upper and lower respiratory specimens during the acute  phase of infection. The expected result is Negative. Fact Sheet for Patients:  StrictlyIdeas.no Fact Sheet for Healthcare Providers: BankingDealers.co.za This test is not yet approved or cleared by the [place] FDA and has been authorized for detection and/or diagnosis of SARS-CoV-2 by FDA under an  Emergency Use Authorization (EUA).  This EUA will remain in effect (meaning this test can be used) for the duration of the COVID-19 declaration under Section 564(b)(1) of the Act, 21 U.S.C. section 360bbb-3(b)(1), unless the authorization is terminated or revoked sooner. Performed at [facility], [place] [address], [place], [place] [address]   Blood Culture (routine x 2)     Status: None   Collection Time: 07/05/18 10:54 AM  Result Value Ref Range Status   Specimen Description BLOOD LEFT WRIST  Final   Special Requests   Final    BOTTLES DRAWN AEROBIC AND ANAEROBIC Blood Culture adequate volume   Culture   Final    NO GROWTH 5 DAYS Performed at [facility], [place] [address] [place], [place] [address]    Report Status 07/10/2018 FINAL  Final    Time coordinating discharge: Approximately 40 minutes  Patrecia Pour, MD  [facility] 07/10/2018, 1:24 PM

## 2018-08-02 ENCOUNTER — Telehealth: Payer: Self-pay | Admitting: *Deleted

## 2018-08-02 NOTE — Telephone Encounter (Signed)
I used [employer] [phone] for Arabic.  I called to request plasma donation since she had been COVID-19 positive.   No answer.

## 2018-09-25 ENCOUNTER — Encounter (HOSPITAL_COMMUNITY): Payer: Self-pay | Admitting: Emergency Medicine

## 2018-09-25 ENCOUNTER — Emergency Department (HOSPITAL_COMMUNITY): Payer: Medicare (Managed Care)

## 2018-09-25 ENCOUNTER — Emergency Department (HOSPITAL_COMMUNITY)
Admission: EM | Admit: 2018-09-25 | Discharge: 2018-09-25 | Disposition: A | Discharge status: Home / Self Care | Payer: Medicare (Managed Care) | Attending: Emergency Medicine | Admitting: Emergency Medicine

## 2018-09-25 DIAGNOSIS — Z794 Long term (current) use of insulin: Secondary | ICD-10-CM | POA: Diagnosis not present

## 2018-09-25 DIAGNOSIS — Z79899 Other long term (current) drug therapy: Secondary | ICD-10-CM | POA: Insufficient documentation

## 2018-09-25 DIAGNOSIS — R05 Cough: Secondary | ICD-10-CM | POA: Diagnosis not present

## 2018-09-25 DIAGNOSIS — R0789 Other chest pain: Secondary | ICD-10-CM | POA: Diagnosis present

## 2018-09-25 DIAGNOSIS — Z7982 Long term (current) use of aspirin: Secondary | ICD-10-CM | POA: Diagnosis not present

## 2018-09-25 DIAGNOSIS — R739 Hyperglycemia, unspecified: Secondary | ICD-10-CM

## 2018-09-25 DIAGNOSIS — Z20828 Contact with and (suspected) exposure to other viral communicable diseases: Secondary | ICD-10-CM | POA: Diagnosis not present

## 2018-09-25 DIAGNOSIS — Z8619 Personal history of other infectious and parasitic diseases: Secondary | ICD-10-CM

## 2018-09-25 DIAGNOSIS — R072 Precordial pain: Secondary | ICD-10-CM

## 2018-09-25 DIAGNOSIS — R0602 Shortness of breath: Secondary | ICD-10-CM | POA: Diagnosis not present

## 2018-09-25 DIAGNOSIS — R059 Cough, unspecified: Secondary | ICD-10-CM

## 2018-09-25 DIAGNOSIS — I10 Essential (primary) hypertension: Secondary | ICD-10-CM | POA: Diagnosis not present

## 2018-09-25 DIAGNOSIS — E119 Type 2 diabetes mellitus without complications: Secondary | ICD-10-CM | POA: Diagnosis not present

## 2018-09-25 DIAGNOSIS — N184 Chronic kidney disease, stage 4 (severe): Secondary | ICD-10-CM

## 2018-09-25 DIAGNOSIS — Z8616 Personal history of COVID-19: Secondary | ICD-10-CM

## 2018-09-25 LAB — COMPREHENSIVE METABOLIC PANEL
ALT: 16 U/L (ref 0–44)
AST: 17 U/L (ref 15–41)
Albumin: 3.1 g/dL — ABNORMAL LOW (ref 3.5–5.0)
Alkaline Phosphatase: 115 U/L (ref 38–126)
Anion gap: 12 (ref 5–15)
BUN: 42 mg/dL — ABNORMAL HIGH (ref 6–20)
CO2: 20 mmol/L — ABNORMAL LOW (ref 22–32)
Calcium: 9 mg/dL (ref 8.9–10.3)
Chloride: 107 mmol/L (ref 98–111)
Creatinine, Ser: 2.85 mg/dL — ABNORMAL HIGH (ref 0.44–1.00)
GFR calc Af Amer: 20 mL/min — ABNORMAL LOW (ref >60)
GFR calc non Af Amer: 17 mL/min — ABNORMAL LOW (ref >60)
Glucose, Bld: 353 mg/dL — ABNORMAL HIGH (ref 70–99)
Potassium: 3.8 mmol/L (ref 3.5–5.1)
Sodium: 139 mmol/L (ref 135–145)
Total Bilirubin: 0.4 mg/dL (ref 0.3–1.2)
Total Protein: 7.6 g/dL (ref 6.5–8.1)

## 2018-09-25 LAB — TROPONIN I (HIGH SENSITIVITY)
Troponin I (High Sensitivity): 34 ng/L — ABNORMAL HIGH (ref <18)
Troponin I (High Sensitivity): 33 ng/L — ABNORMAL HIGH (ref <18)

## 2018-09-25 LAB — CBC WITH DIFFERENTIAL/PLATELET
Abs Immature Granulocytes: 0.06 10*3/uL (ref 0.00–0.07)
Basophils Absolute: 0 10*3/uL (ref 0.0–0.1)
Basophils Relative: 0 %
Eosinophils Absolute: 0.1 10*3/uL (ref 0.0–0.5)
Eosinophils Relative: 1 %
HCT: 33.3 % — ABNORMAL LOW (ref 36.0–46.0)
Hemoglobin: 10.4 g/dL — ABNORMAL LOW (ref 12.0–15.0)
Immature Granulocytes: 1 %
Lymphocytes Relative: 31 %
Lymphs Abs: 2.5 10*3/uL (ref 0.7–4.0)
MCH: 25.7 pg — ABNORMAL LOW (ref 26.0–34.0)
MCHC: 31.2 g/dL (ref 30.0–36.0)
MCV: 82.4 fL (ref 80.0–100.0)
Monocytes Absolute: 0.4 10*3/uL (ref 0.1–1.0)
Monocytes Relative: 5 %
Neutro Abs: 5 10*3/uL (ref 1.7–7.7)
Neutrophils Relative %: 62 %
Platelets: 328 10*3/uL (ref 150–400)
RBC: 4.04 MIL/uL (ref 3.87–5.11)
RDW: 15 % (ref 11.5–15.5)
WBC: 8 10*3/uL (ref 4.0–10.5)
nRBC: 0 % (ref 0.0–0.2)

## 2018-09-25 LAB — BRAIN NATRIURETIC PEPTIDE: B Natriuretic Peptide: 622.9 pg/mL — ABNORMAL HIGH (ref 0.0–100.0)

## 2018-09-25 LAB — URINALYSIS, ROUTINE W REFLEX MICROSCOPIC
Bacteria, UA: NONE SEEN
Bilirubin Urine: NEGATIVE
Glucose, UA: >=500 mg/dL — AB
Hgb urine dipstick: NEGATIVE
Ketones, ur: NEGATIVE mg/dL
Nitrite: NEGATIVE
Protein, ur: >=300 mg/dL — AB
Specific Gravity, Urine: 1.013 (ref 1.005–1.030)
pH: 6 (ref 5.0–8.0)

## 2018-09-25 LAB — SARS CORONAVIRUS 2 BY RT PCR (HOSPITAL ORDER, PERFORMED IN ~~LOC~~ HOSPITAL LAB): SARS Coronavirus 2: NEGATIVE

## 2018-09-25 LAB — LIPASE, BLOOD: Lipase: 42 U/L (ref 11–51)

## 2018-09-25 LAB — D-DIMER, QUANTITATIVE: D-Dimer, Quant: 0.77 ug/mL-FEU — ABNORMAL HIGH (ref 0.00–0.50)

## 2018-09-25 MED ORDER — TECHNETIUM TO 99M ALBUMIN AGGREGATED
1.5500 | Freq: Once | INTRAVENOUS | Status: AC | PRN
  Administered 2018-09-25: 17:00:00 1.55 via INTRAVENOUS

## 2018-09-25 NOTE — ED Notes (Signed)
Patient verbalized understanding of dc instructions with the use of [place]ese interpretor, vss. Awaiting [facility] for transport home

## 2018-09-25 NOTE — ED Provider Notes (Signed)
Pt signed out by Dr. Gustavus Messing pending labs and Xrays.  CXR neg.  Ddimer slightly elevated, but Cr too high for a CTA.  VQ negative for PE.  Elevation in cr is chronic.  Pt is hyperglycemic, but has been here all day and has not taken her meds.  I think sx are from her previous covid infection.  She has no evidence of stemi or chf.  She is instructed to return if worse.  Information is explained via interpreter.  Kimberly Lane was evaluated in Emergency Department on 09/25/2018 for the symptoms described in the history of present illness. She was evaluated in the context of the global COVID-19 pandemic, which necessitated consideration that the patient might be at risk for infection with the SARS-CoV-2 virus that causes COVID-19. Institutional protocols and algorithms that pertain to the evaluation of patients at risk for COVID-19 are in a state of rapid change based on information released by regulatory bodies including the CDC and federal and state organizations. These policies and algorithms were followed during the patient's care in the ED.   Kimberly Pence, MD 09/25/18 [phone]

## 2018-09-25 NOTE — ED Triage Notes (Addendum)
Pt from home. Complaints of SOB and CP X 3days. Unable to sleep at night. Denies N/V

## 2018-09-25 NOTE — ED Provider Notes (Signed)
[name] DEPARTMENT Provider Note   CSN: [id number] Arrival date & time: 09/25/18  1334     History   Chief Complaint Chief Complaint  Patient presents with  . Chest Pain    HPI Kimberly Lane is a 58 y.o. female.     The history is provided by the patient, medical records and a caregiver (Dr. Jimmye Norman, the pt's  PCP). The history is limited by a language barrier. A language interpreter was used.  Chest Pain Pain location:  Substernal area Pain quality: crushing and pressure   Pain radiates to:  Does not radiate Pain severity:  Moderate Onset quality:  Gradual Duration:  3 days Timing:  Intermittent Progression:  Waxing and waning Chronicity:  New Relieved by:  Rest Worsened by:  Exertion, coughing and deep breathing Ineffective treatments:  None tried Associated symptoms: cough, orthopnea and shortness of breath   Associated symptoms: no abdominal pain, no anxiety, no back pain, no diaphoresis, no dizziness, no dysphagia, no fatigue, no fever, no headache, no lower extremity edema, no nausea, no near-syncope, no palpitations, no vomiting and no weakness   Risk factors: diabetes mellitus and hypertension   Risk factors: not female     Past Medical History:  Diagnosis Date  . Allergy   . Anemia   . Anxiety   . Cellulitis and abscess of foot 05/13/2014   rt foot  . Diabetes mellitus   . Hypertension   . Positive TB test 2013  . PVD (peripheral vascular disease) ([place])    s/p R BKA and L AKA  . Type 2 diabetes with peripheral circulatory disorder, controlled [facility])     Patient Active Problem List   Diagnosis Date Noted  . 2019 novel coronavirus disease (COVID-19) 07/05/2018  . Acute renal failure (ARF) ([place]) 07/05/2018  . Type 2 diabetes with peripheral circulatory disorder, controlled ([place])   . PVD (peripheral vascular disease) ([place])   . AKI (acute kidney injury) ([place])   . Right upper quadrant abdominal pain   . Hypertensive  urgency 02/28/2018  . Neuropathy LLE 08/31/2016  . Left hip pain 08/31/2016  . Labile blood pressure   . Labile blood glucose   . Acute blood loss anemia   . Hypokalemia   . Benign essential HTN   . Uncontrolled diabetes mellitus type 2 with peripheral artery disease ([place])   . Acute diastolic congestive heart failure ([place]) 08/19/2016  . Unilateral AKA, left ([place]) 08/19/2016  . Post-operative pain   . Leukocytosis   . Anxiety state   . S/P AKA (above knee amputation), left ([place]) 08/17/2016  . Acute respiratory failure ([place])   . Dyspnea   . Gangrene due to peripheral vascular disease ([place]) 08/09/2016  . PAD (peripheral artery disease) ([place]) 03/11/2016  . Dysuria 10/24/2014  . Osteomyelitis of left foot ([place])   . Cough   . Hx of right BKA ([place]) 05/26/2014  . S/P BKA (below knee amputation) unilateral, right ([place])   . Abnormal EKG   . Preop cardiovascular exam 05/21/2014  . Cellulitis of toe of right foot   . Peripheral arterial disease ([place])   . Cellulitis 05/13/2014  . Type 2 diabetes mellitus with complication ([place])   . HLD (hyperlipidemia)   . Pure hypercholesterolemia 11/14/2013  . High risk social situation 11/13/2013  . Back pain without radiation 09/05/2011    Class: Acute  . Osteoarthritis of left hip 05/31/2011  . History of primary TB 04/26/2011  . DM (diabetes  mellitus), type 2, uncontrolled ([place]) 05/17/2010  . Essential hypertension 12/09/2009  . Muscular chest pain 12/09/2009    Past Surgical History:  Procedure Laterality Date  . ABDOMINAL AORTAGRAM N/A 05/20/2014   Procedure: ABDOMINAL Maxcine Ham;  Surgeon: Serafina Mitchell, MD;  Location: [facility] CATH LAB;  Service: Cardiovascular;  Laterality: N/A;  . AMPUTATION Right 05/23/2014   Procedure: AMPUTATION BELOW KNEE;  Surgeon: Mal Misty, MD;  Location: [facility];  Service: Vascular;  Laterality: Right;  . AMPUTATION Left 08/09/2016   Procedure: LEFT GREAT TOE AMPUTATION;  Surgeon: Waynetta Sandy, MD;   Location: [facility];  Service: Vascular;  Laterality: Left;  . AMPUTATION Left 08/15/2016   Procedure: AMPUTATION ABOVE KNEE;  Surgeon: Rosetta Posner, MD;  Location: [facility];  Service: Vascular;  Laterality: Left;  . PERIPHERAL VASCULAR CATHETERIZATION N/A 03/11/2016   Procedure: Abdominal Aortogram w/Lower Extremity;  Surgeon: Elam Dutch, MD;  Location: [facility] CV LAB;  Service: Cardiovascular;  Laterality: N/A;  . PERIPHERAL VASCULAR CATHETERIZATION Left 03/11/2016   Procedure: Peripheral Vascular Intervention;  Surgeon: Elam Dutch, MD;  Location: [facility] CV LAB;  Service: Cardiovascular;  Laterality: Left;  Superficial femoral     OB History   No obstetric history on file.      Home Medications    Prior to Admission medications   Medication Sig Start Date End Date Taking? Authorizing Provider  acetaminophen (TYLENOL) 500 MG tablet Take 500 mg by mouth 3 (three) times daily as needed for mild pain.    [provider]  acetaminophen (TYLENOL) 650 MG CR tablet Take 650 mg by mouth every 8 (eight) hours as needed for pain.    [provider]  Amino Acids-Protein Hydrolys (FEEDING SUPPLEMENT, PRO-STAT SUGAR FREE 64,) LIQD Take 30 mLs by mouth 2 (two) times daily. 09/01/16   Love, Ivan Anchors, PA-C  amLODipine-atorvastatin (CADUET) 10-40 MG tablet Take 1 tablet by mouth daily.    [provider]  amLODipine-atorvastatin (CADUET) 10-40 MG tablet Take 1 tablet by mouth daily.     [provider]  aspirin 81 MG tablet Take 1 tablet (81 mg total) by mouth daily. 01/20/15   Veatrice Bourbon, MD  aspirin EC 81 MG tablet Take 81 mg by mouth daily.     [provider]  Cholecalciferol (VITAMIN D3) 50 MCG (2000 UT) TABS Take 1 tablet by mouth daily.     [provider]  citalopram (CELEXA) 20 MG tablet Take 1.5 tablets (30 mg total) by mouth daily. 09/01/16   Love, Ivan Anchors, PA-C  cloNIDine (CATAPRES - DOSED IN MG/24 HR) 0.3 mg/24hr patch  Place 0.3 mg onto the skin once a week. For blood pressure    [provider]  clopidogrel (PLAVIX) 75 MG tablet Take 1 tablet (75 mg total) by mouth daily with breakfast. 03/12/16   Rhyne, Hulen Shouts, PA-C  cyclobenzaprine (FLEXERIL) 5 MG tablet Take 1 tablet (5 mg total) by mouth 3 (three) times daily as needed for muscle spasms. 09/01/16   Love, Ivan Anchors, PA-C  diclofenac sodium (VOLTAREN) 1 % GEL Apply 2 g topically 2 (two) times daily as needed (right chest pain). 03/01/18   Carroll Sage, MD  furosemide (LASIX) 20 MG tablet Take 20 mg by mouth every morning. For fluid retention    [provider]  gabapentin (NEURONTIN) 100 MG capsule Take 1 capsule (100 mg total) by mouth 3 (three) times daily. 09/01/16   Bary Leriche, PA-C  insulin aspart (NOVOLOG) 100 UNIT/ML injection Inject 0-20 Units into the skin 3 (three) times daily with meals. 08/19/16   Ledell Noss, MD  insulin aspart (NOVOLOG) 100 UNIT/ML injection Inject 0-9 Units into the skin 3 (three) times daily with meals. 09/01/16   Love, Ivan Anchors, PA-C  Insulin Glargine (BASAGLAR KWIKPEN) 100 UNIT/ML SOPN Inject 15 Units into the skin daily.    [provider]  insulin glargine (LANTUS) 100 UNIT/ML injection Inject 0.15 mLs (15 Units total) into the skin at bedtime. 09/01/16   Love, Ivan Anchors, PA-C  metFORMIN (GLUCOPHAGE) 500 MG tablet Take 1 tablet (500 mg total) by mouth 2 (two) times daily with a meal. 09/01/16   Love, Ivan Anchors, PA-C  methocarbamol (ROBAXIN) 500 MG tablet Take 1 tablet (500 mg total) by mouth 3 (three) times daily. 09/01/16   Love, Ivan Anchors, PA-C  Multiple Vitamin (MULTIVITAMIN WITH MINERALS) TABS tablet Take 1 tablet by mouth daily. 08/19/16   Ledell Noss, MD  olmesartan-hydrochlorothiazide (BENICAR HCT) 40-25 MG tablet Take 1 tablet by mouth daily. 03/01/18 03/31/18  Carroll Sage, MD  pantoprazole (PROTONIX) 40 MG tablet Take 1 tablet (40 mg total) by mouth daily. 09/02/16   Love, Ivan Anchors, PA-C   polyethylene glycol (MIRALAX / GLYCOLAX) packet Take 17 g by mouth daily as needed for mild constipation. 09/01/16   Love, Ivan Anchors, PA-C  potassium chloride SA (K-DUR,KLOR-CON) 20 MEQ tablet Take 2 tablets (40 mEq total) by mouth 3 (three) times daily before meals. 09/01/16   Love, Ivan Anchors, PA-C  promethazine (PHENERGAN) 12.5 MG tablet Take 1 tablet (12.5 mg total) by mouth every 6 (six) hours as needed for refractory nausea / vomiting. 07/10/18   Patrecia Pour, MD  senna-docusate (SENOKOT-S) 8.6-50 MG tablet Take 1 tablet by mouth at bedtime. 09/01/16   Love, Ivan Anchors, PA-C  sennosides-docusate sodium (SENOKOT-S) 8.6-50 MG tablet Take 1 tablet by mouth at bedtime. For constipation    [provider]  spironolactone (ALDACTONE) 25 MG tablet Take 25 mg by mouth daily. Blood pressure     [provider]    Family History Family History  Problem Relation Age of Onset  . Heart disease Other        No family history    Social History Social History   Tobacco Use  . Smoking status: Never Smoker  . Smokeless tobacco: Never Used  Substance Use Topics  . Alcohol use: No  . Drug use: No     Allergies   Bactrim [sulfamethoxazole-trimethoprim], Ace inhibitors, Beef-derived products, Pork-derived products, Sulfa antibiotics, Tuberculin ppd, Tuberculin tests, and Ace inhibitors   Review of Systems Review of Systems  Constitutional: Negative for chills, diaphoresis, fatigue and fever.  HENT: Negative for congestion and trouble swallowing.   Eyes: Negative for visual disturbance.  Respiratory: Positive for cough and shortness of breath. Negative for chest tightness and wheezing.   Cardiovascular: Positive for chest pain and orthopnea. Negative for palpitations, leg swelling and near-syncope.  Gastrointestinal: Negative for abdominal pain, constipation, diarrhea, nausea and vomiting.  Genitourinary: Negative for flank pain.  Musculoskeletal: Negative for back pain, neck pain  and neck stiffness.  Skin: Negative for rash and wound.  Neurological: Negative for dizziness, syncope, weakness, light-headedness and headaches.  Psychiatric/Behavioral: Negative for agitation.  All other systems reviewed and are negative.    Physical Exam Updated Vital Signs BP (!) 212/74   Pulse 86   Temp 98.1 F (36.7 C) (Oral)   Resp (!) 27  SpO2 98%   Physical Exam Vitals signs and nursing note reviewed.  Constitutional:      General: She is not in acute distress.    Appearance: She is not ill-appearing, toxic-appearing or diaphoretic.  HENT:     Head: Normocephalic.  Eyes:     Extraocular Movements: Extraocular movements intact.     Pupils: Pupils are equal, round, and reactive to light.  Cardiovascular:     Rate and Rhythm: Normal rate and regular rhythm.     Heart sounds: Murmur present. Systolic murmur present.  Pulmonary:     Effort: Pulmonary effort is normal. Tachypnea present.     Breath sounds: Rhonchi and rales present. No wheezing.  Chest:     Chest wall: Tenderness present.    Abdominal:     Palpations: Abdomen is soft.  Musculoskeletal:     Comments: Bilateral leg amputations   Skin:    General: Skin is warm.     Capillary Refill: Capillary refill takes less than 2 seconds.  Neurological:     General: No focal deficit present.     Mental Status: She is alert.      ED Treatments / Results  Labs (all labs ordered are listed, but only abnormal results are displayed) Labs Reviewed  CBC WITH DIFFERENTIAL/PLATELET - Abnormal; Notable for the following components:      Result Value   Hemoglobin 10.4 (*)    HCT 33.3 (*)    MCH 25.7 (*)    All other components within normal limits  D-DIMER, QUANTITATIVE (NOT AT [facility]) - Abnormal; Notable for the following components:   D-Dimer, Quant 0.77 (*)    All other components within normal limits  URINE CULTURE  SARS CORONAVIRUS 2 (HOSPITAL ORDER, PERFORMED [place] [facility])  COMPREHENSIVE  METABOLIC PANEL  BRAIN NATRIURETIC PEPTIDE  LIPASE, BLOOD  URINALYSIS, ROUTINE W REFLEX MICROSCOPIC  TROPONIN I (HIGH SENSITIVITY)    EKG EKG Interpretation  Date/Time:  Tuesday September 25 2018 13:36:31 EDT Ventricular Rate:  86 PR Interval:    QRS Duration: 84 QT Interval:  382 QTC Calculation: 457 R Axis:   36 Text Interpretation:  Sinus rhythm Low voltage, precordial leads Anteroseptal infarct, old Abnormal T, consider ischemia, diffuse leads When compared to prior, no signifianct changes see.  No STEMI Confirmed by Antony Blackbird [phone]) on 09/25/2018 2:01:48 PM   Radiology Dg Chest Portable 1 View  Result Date: 09/25/2018 CLINICAL DATA:  Chest pain and short of breath. COVID-19 7 months ago. EXAM: PORTABLE CHEST 1 VIEW COMPARISON:  07/10/2018 FINDINGS: Resolution of bilateral airspace disease compared with the prior study. Lungs are now clear. Cardiac enlargement without heart failure or effusion. IMPRESSION: No active disease. Electronically Signed   By: Franchot Gallo M.D.   On: 09/25/2018 14:53    Procedures Procedures (including critical care time)  Medications Ordered in ED Medications - No data to display   Initial Impression / Assessment and Plan / ED Course  I have reviewed the triage vital signs and the nursing notes.  Pertinent labs & imaging results that were available during my care of the patient were reviewed by me and considered in my medical decision making (see chart for details).          Kimberly Lane is a 58 y.o. female with a past medical history significant for diabetes, hypertension, chronic kidney disease, and admission back in May for the novel coronavirus who since has been symptomatically cleared who presents at the direction  of her PCP, Dr. Dorian Pod for further work-up and management of shortness of breath.  According to Dr. Jimmye Norman who called and spoke to this examiner prior to evaluated the patient, patient has had worsening  shortness of breath recently.  PCP reports that patient has had worsening shortness of breath to the point where she needs evaluation.  Patient has had difficulty laying flat but denies any chest pain.  PCP is concerned that patient may have worsening kidney disease versus congestive heart failure and may need diuresis.  The PCP reports that patient has not been able to take her medications due to likely cultural barriers in place.  Patient has not had reported fevers, chills, urinary symptoms or GI symptoms aside from her chronic diarrhea which is unchanged per PCP.  A [name] Arabic interpreter was utilized for all conversations with the patient.  When I evaluate the patient, she does report that the patient has been having chest pain for the last several days.  She describes as a pressure in her central chest.  She reports it is very pleuritic and exertional.  She reports that she has had some cough but no fevers or chills.  She reports that she cannot lay flat to the shortness of breath and is very concerned about her breathing.  She describes the pain as moderate in severity and is crushing/pressure-like.  She reports feeling lightheaded with the pain and reports having sweating with the episodes of pain.   On exam, lungs are clear and chest is tender to palpation.  Abdomen is nontender.  Patient has bilateral leg amputations.  Patient is hypertensive on arrival.  Breath sounds are filled with rales and mild rhonchi.   EKG shows no STEMI.  Clinically I am concerned about either pulmonary edema based on the exam and PCP complaint however with her report of chest pressure and chest pain, I am concerned that we need to make sure she is not having a cardiac cause of her chest pain.  With her report that she has had the pleuritic crushing pain and shortness of breath, will add a d-dimer to see if she needs further imaging to rule out pulmonary embolism.  I spoke with the medical director to the emergency  department to discuss if she would need a coronavirus test and as she is having reported cough, shortness of breath, and chest pain, we will retest her with the coronavirus test although it is likely to remain positive from her prior infection.  Care will be transferred to oncoming team to determine disposition for the patient.   Final Clinical Impressions(s) / ED Diagnoses   Final diagnoses:  Precordial pain  Shortness of breath  Cough     Clinical Impression: 1. Precordial pain   2. Shortness of breath   3. Cough     Disposition: Care transferred to oncoming team while awaiting results of diagnostic work-up.  This note was prepared with assistance of Systems analyst. Occasional wrong-word or sound-a-like substitutions may have occurred due to the inherent limitations of voice recognition software.      Myrle Dues, Gwenyth Allegra, MD 09/25/18 1536

## 2018-09-25 NOTE — ED Notes (Signed)
[facility] at bedside to transport patient home. All paperwork for required for transport provided to [facility]

## 2018-09-26 LAB — URINE CULTURE

## 2018-10-10 ENCOUNTER — Encounter (HOSPITAL_COMMUNITY): Payer: Self-pay | Admitting: Emergency Medicine

## 2018-10-10 ENCOUNTER — Inpatient Hospital Stay (HOSPITAL_COMMUNITY)
Admission: EM | Admit: 2018-10-10 | Discharge: 2018-10-11 | DRG: 291 | Disposition: A | Discharge status: Home / Self Care | Payer: Medicare (Managed Care) | Attending: Internal Medicine | Admitting: Internal Medicine

## 2018-10-10 ENCOUNTER — Emergency Department (HOSPITAL_COMMUNITY): Payer: Medicare (Managed Care)

## 2018-10-10 ENCOUNTER — Other Ambulatory Visit: Payer: Self-pay

## 2018-10-10 DIAGNOSIS — Z882 Allergy status to sulfonamides status: Secondary | ICD-10-CM

## 2018-10-10 DIAGNOSIS — I509 Heart failure, unspecified: Secondary | ICD-10-CM

## 2018-10-10 DIAGNOSIS — S78112A Complete traumatic amputation at level between left hip and knee, initial encounter: Secondary | ICD-10-CM | POA: Diagnosis present

## 2018-10-10 DIAGNOSIS — N179 Acute kidney failure, unspecified: Secondary | ICD-10-CM

## 2018-10-10 DIAGNOSIS — Z7902 Long term (current) use of antithrombotics/antiplatelets: Secondary | ICD-10-CM

## 2018-10-10 DIAGNOSIS — Z89511 Acquired absence of right leg below knee: Secondary | ICD-10-CM

## 2018-10-10 DIAGNOSIS — Z794 Long term (current) use of insulin: Secondary | ICD-10-CM

## 2018-10-10 DIAGNOSIS — Z89612 Acquired absence of left leg above knee: Secondary | ICD-10-CM

## 2018-10-10 DIAGNOSIS — Z888 Allergy status to other drugs, medicaments and biological substances status: Secondary | ICD-10-CM | POA: Diagnosis not present

## 2018-10-10 DIAGNOSIS — R197 Diarrhea, unspecified: Secondary | ICD-10-CM | POA: Diagnosis present

## 2018-10-10 DIAGNOSIS — R0602 Shortness of breath: Secondary | ICD-10-CM | POA: Diagnosis not present

## 2018-10-10 DIAGNOSIS — E1122 Type 2 diabetes mellitus with diabetic chronic kidney disease: Secondary | ICD-10-CM | POA: Diagnosis present

## 2018-10-10 DIAGNOSIS — Z9119 Patient's noncompliance with other medical treatment and regimen: Secondary | ICD-10-CM

## 2018-10-10 DIAGNOSIS — E1165 Type 2 diabetes mellitus with hyperglycemia: Secondary | ICD-10-CM | POA: Diagnosis present

## 2018-10-10 DIAGNOSIS — Z20828 Contact with and (suspected) exposure to other viral communicable diseases: Secondary | ICD-10-CM | POA: Diagnosis present

## 2018-10-10 DIAGNOSIS — N184 Chronic kidney disease, stage 4 (severe): Secondary | ICD-10-CM | POA: Diagnosis present

## 2018-10-10 DIAGNOSIS — Z8619 Personal history of other infectious and parasitic diseases: Secondary | ICD-10-CM

## 2018-10-10 DIAGNOSIS — E1151 Type 2 diabetes mellitus with diabetic peripheral angiopathy without gangrene: Secondary | ICD-10-CM | POA: Diagnosis present

## 2018-10-10 DIAGNOSIS — I5033 Acute on chronic diastolic (congestive) heart failure: Secondary | ICD-10-CM | POA: Diagnosis present

## 2018-10-10 DIAGNOSIS — Z7982 Long term (current) use of aspirin: Secondary | ICD-10-CM

## 2018-10-10 DIAGNOSIS — Z9114 Patient's other noncompliance with medication regimen: Secondary | ICD-10-CM | POA: Diagnosis not present

## 2018-10-10 DIAGNOSIS — Z79899 Other long term (current) drug therapy: Secondary | ICD-10-CM | POA: Diagnosis not present

## 2018-10-10 DIAGNOSIS — IMO0002 Reserved for concepts with insufficient information to code with codable children: Secondary | ICD-10-CM | POA: Diagnosis present

## 2018-10-10 DIAGNOSIS — I130 Hypertensive heart and chronic kidney disease with heart failure and stage 1 through stage 4 chronic kidney disease, or unspecified chronic kidney disease: Secondary | ICD-10-CM | POA: Diagnosis not present

## 2018-10-10 DIAGNOSIS — N189 Chronic kidney disease, unspecified: Secondary | ICD-10-CM

## 2018-10-10 LAB — COMPREHENSIVE METABOLIC PANEL
ALT: 11 U/L (ref 0–44)
AST: 26 U/L (ref 15–41)
Albumin: 2.9 g/dL — ABNORMAL LOW (ref 3.5–5.0)
Alkaline Phosphatase: 108 U/L (ref 38–126)
Anion gap: 10 (ref 5–15)
BUN: 35 mg/dL — ABNORMAL HIGH (ref 6–20)
CO2: 21 mmol/L — ABNORMAL LOW (ref 22–32)
Calcium: 8.7 mg/dL — ABNORMAL LOW (ref 8.9–10.3)
Chloride: 106 mmol/L (ref 98–111)
Creatinine, Ser: 3.06 mg/dL — ABNORMAL HIGH (ref 0.44–1.00)
GFR calc Af Amer: 18 mL/min — ABNORMAL LOW (ref >60)
GFR calc non Af Amer: 16 mL/min — ABNORMAL LOW (ref >60)
Glucose, Bld: 301 mg/dL — ABNORMAL HIGH (ref 70–99)
Potassium: 4.3 mmol/L (ref 3.5–5.1)
Sodium: 137 mmol/L (ref 135–145)
Total Bilirubin: 0.9 mg/dL (ref 0.3–1.2)
Total Protein: 6.8 g/dL (ref 6.5–8.1)

## 2018-10-10 LAB — CBC WITH DIFFERENTIAL/PLATELET
Abs Immature Granulocytes: 0.02 10*3/uL (ref 0.00–0.07)
Basophils Absolute: 0 10*3/uL (ref 0.0–0.1)
Basophils Relative: 1 %
Eosinophils Absolute: 0.1 10*3/uL (ref 0.0–0.5)
Eosinophils Relative: 2 %
HCT: 31.3 % — ABNORMAL LOW (ref 36.0–46.0)
Hemoglobin: 9.9 g/dL — ABNORMAL LOW (ref 12.0–15.0)
Immature Granulocytes: 0 %
Lymphocytes Relative: 33 %
Lymphs Abs: 2.3 10*3/uL (ref 0.7–4.0)
MCH: 25.4 pg — ABNORMAL LOW (ref 26.0–34.0)
MCHC: 31.6 g/dL (ref 30.0–36.0)
MCV: 80.3 fL (ref 80.0–100.0)
Monocytes Absolute: 0.4 10*3/uL (ref 0.1–1.0)
Monocytes Relative: 6 %
Neutro Abs: 4.2 10*3/uL (ref 1.7–7.7)
Neutrophils Relative %: 58 %
Platelets: 284 10*3/uL (ref 150–400)
RBC: 3.9 MIL/uL (ref 3.87–5.11)
RDW: 14.3 % (ref 11.5–15.5)
WBC: 7.1 10*3/uL (ref 4.0–10.5)
nRBC: 0 % (ref 0.0–0.2)

## 2018-10-10 LAB — LIPASE, BLOOD: Lipase: 37 U/L (ref 11–51)

## 2018-10-10 LAB — TROPONIN I (HIGH SENSITIVITY)
Troponin I (High Sensitivity): 28 ng/L — ABNORMAL HIGH (ref <18)
Troponin I (High Sensitivity): 26 ng/L — ABNORMAL HIGH (ref <18)
Troponin I (High Sensitivity): 24 ng/L — ABNORMAL HIGH (ref <18)

## 2018-10-10 LAB — CBG MONITORING, ED: Glucose-Capillary: 273 mg/dL — ABNORMAL HIGH (ref 70–99)

## 2018-10-10 LAB — GLUCOSE, CAPILLARY
Glucose-Capillary: 249 mg/dL — ABNORMAL HIGH (ref 70–99)
Glucose-Capillary: 342 mg/dL — ABNORMAL HIGH (ref 70–99)

## 2018-10-10 LAB — BRAIN NATRIURETIC PEPTIDE: B Natriuretic Peptide: 813.2 pg/mL — ABNORMAL HIGH (ref 0.0–100.0)

## 2018-10-10 LAB — HEMOGLOBIN A1C
Hgb A1c MFr Bld: 11.6 % — ABNORMAL HIGH (ref 4.8–5.6)
Mean Plasma Glucose: 286.22 mg/dL

## 2018-10-10 LAB — LACTIC ACID, PLASMA: Lactic Acid, Venous: 1.1 mmol/L (ref 0.5–1.9)

## 2018-10-10 LAB — SARS CORONAVIRUS 2 BY RT PCR (HOSPITAL ORDER, PERFORMED IN ~~LOC~~ HOSPITAL LAB): SARS Coronavirus 2: NEGATIVE

## 2018-10-10 MED ORDER — INSULIN GLARGINE 100 UNIT/ML ~~LOC~~ SOLN
7.0000 [IU] | Freq: Every day | SUBCUTANEOUS | Status: DC
  Administered 2018-10-10: 7 [IU] via SUBCUTANEOUS
  Filled 2018-10-10 (×2): qty 0.07

## 2018-10-10 MED ORDER — INSULIN ASPART 100 UNIT/ML ~~LOC~~ SOLN
0.0000 [IU] | Freq: Three times a day (TID) | SUBCUTANEOUS | Status: DC
  Administered 2018-10-10: 3 [IU] via SUBCUTANEOUS
  Administered 2018-10-11: 2 [IU] via SUBCUTANEOUS
  Administered 2018-10-11: 7 [IU] via SUBCUTANEOUS

## 2018-10-10 MED ORDER — ASPIRIN EC 81 MG PO TBEC
81.0000 mg | DELAYED_RELEASE_TABLET | Freq: Every day | ORAL | Status: DC
  Administered 2018-10-11: 81 mg via ORAL
  Filled 2018-10-10: qty 1

## 2018-10-10 MED ORDER — SPIRONOLACTONE 25 MG PO TABS
25.0000 mg | ORAL_TABLET | Freq: Every day | ORAL | Status: DC
  Administered 2018-10-10 – 2018-10-11 (×2): 25 mg via ORAL
  Filled 2018-10-10 (×2): qty 1

## 2018-10-10 MED ORDER — ACETAMINOPHEN 650 MG RE SUPP: 650.0000 mg | Freq: Four times a day (QID) | RECTAL | Status: DC | PRN

## 2018-10-10 MED ORDER — FUROSEMIDE 10 MG/ML IJ SOLN
20.0000 mg | Freq: Once | INTRAMUSCULAR | Status: AC
  Administered 2018-10-10: 13:00:00 20 mg via INTRAVENOUS
  Filled 2018-10-10: qty 2

## 2018-10-10 MED ORDER — CLOPIDOGREL BISULFATE 75 MG PO TABS: 75.0000 mg | ORAL_TABLET | Freq: Every day | ORAL | Status: DC

## 2018-10-10 MED ORDER — INSULIN ASPART 100 UNIT/ML ~~LOC~~ SOLN
0.0000 [IU] | Freq: Every day | SUBCUTANEOUS | Status: DC
  Administered 2018-10-10: 22:00:00 4 [IU] via SUBCUTANEOUS

## 2018-10-10 MED ORDER — CITALOPRAM HYDROBROMIDE 20 MG PO TABS
30.0000 mg | ORAL_TABLET | Freq: Every day | ORAL | Status: DC
  Administered 2018-10-11: 10:00:00 30 mg via ORAL
  Filled 2018-10-10: qty 1

## 2018-10-10 MED ORDER — OXYCODONE HCL 5 MG PO TABS: 5.0000 mg | ORAL_TABLET | Freq: Once | ORAL | Status: DC

## 2018-10-10 MED ORDER — CLOPIDOGREL BISULFATE 75 MG PO TABS
75.0000 mg | ORAL_TABLET | Freq: Every day | ORAL | Status: DC
  Administered 2018-10-11: 75 mg via ORAL
  Filled 2018-10-10: qty 1

## 2018-10-10 MED ORDER — ACETAMINOPHEN 325 MG PO TABS: 650.0000 mg | ORAL_TABLET | Freq: Four times a day (QID) | ORAL | Status: DC | PRN

## 2018-10-10 MED ORDER — AMLODIPINE-ATORVASTATIN 10-40 MG PO TABS: 1.0000 | ORAL_TABLET | Freq: Every day | ORAL | Status: DC

## 2018-10-10 MED ORDER — ONDANSETRON HCL 4 MG PO TABS: 4.0000 mg | ORAL_TABLET | Freq: Four times a day (QID) | ORAL | Status: DC | PRN

## 2018-10-10 MED ORDER — ATORVASTATIN CALCIUM 40 MG PO TABS
40.0000 mg | ORAL_TABLET | Freq: Every day | ORAL | Status: DC
  Administered 2018-10-10 – 2018-10-11 (×2): 40 mg via ORAL
  Filled 2018-10-10 (×2): qty 1

## 2018-10-10 MED ORDER — FUROSEMIDE 10 MG/ML IJ SOLN
60.0000 mg | Freq: Every day | INTRAMUSCULAR | Status: DC
  Administered 2018-10-10: 60 mg via INTRAVENOUS
  Filled 2018-10-10: qty 6

## 2018-10-10 MED ORDER — AMLODIPINE BESYLATE 10 MG PO TABS
10.0000 mg | ORAL_TABLET | Freq: Every day | ORAL | Status: DC
  Administered 2018-10-10 – 2018-10-11 (×2): 10 mg via ORAL
  Filled 2018-10-10 (×2): qty 1

## 2018-10-10 MED ORDER — CLONIDINE HCL 0.3 MG/24HR TD PTWK
0.3000 mg | MEDICATED_PATCH | TRANSDERMAL | Status: DC
  Administered 2018-10-10: 22:00:00 0.3 mg via TRANSDERMAL
  Filled 2018-10-10: qty 1

## 2018-10-10 MED ORDER — ONDANSETRON HCL 4 MG/2ML IJ SOLN: 4.0000 mg | Freq: Four times a day (QID) | INTRAMUSCULAR | Status: DC | PRN

## 2018-10-10 MED ORDER — PHENYLEPHRINE HCL-NACL 10-0.9 MG/250ML-% IV SOLN
INTRAVENOUS | Status: AC
  Filled 2018-10-10: qty 250

## 2018-10-10 MED ORDER — PANTOPRAZOLE SODIUM 40 MG PO TBEC
40.0000 mg | DELAYED_RELEASE_TABLET | Freq: Every day | ORAL | Status: DC
  Administered 2018-10-11: 40 mg via ORAL
  Filled 2018-10-10: qty 1

## 2018-10-10 NOTE — ED Triage Notes (Signed)
Patient arrived via [facility] with reports of chest pain X2 days; abdominal pain for "a long time," with swelling Interpreter used

## 2018-10-10 NOTE — Progress Notes (Signed)
Paged at 2100 by RN for chest pain. RN states that chest pain began about one hour ago. EKG ordered per RN. Appears stable from prior EKGs per chart review.   Dr. Maricela Bo and myself assessed patient at bedside with assistance of interpreter. Patient relates that the chest pain has actually improved from yesterday. Pain is constant and sharp in nature. Radiates to her neck. Pain is not relative to position. Also endorses shortness of breath which she implies is improved from yesterday. Also endorses some dizziness/lightheadedness which she states has improved from yesterday.  We briefly discussed her medications. Patient relayed frustration in not knowing her medication names but states that she does take them consistently. Husband will bring in pill bottles tomorrow. She is also frustrated with the number of pills she must take and implies confusion as to which medicine is for what problem. We discussed that we will relay this to the day team with hopes of being able to minimize the number of pills and confusion. PE: well appearing. In no acute distress. Breathing comfortably on room air. Bibasilar crackles appreciated. Heart regular rate and rhythm. Bilateral LE amputations.  Blood pressure is slightly higher now than it was earlier today. Discussed with RN to place clonidine patch now.  Will review other home medications and consider restarting some of them.

## 2018-10-10 NOTE — H&P (Addendum)
Date: 10/10/2018               Patient Name:  Kimberly Lane MRN: [id number]  DOB: [birth year] Age / Sex: 58 y.o., female   PCP: Angelica Pou, MD         Medical Service: Internal Medicine Teaching Service         Attending Physician: Dr. Lucious Groves, DO    First Contact: Dr. Gilford Rile Pager: [phone]  Second Contact: Dr. Tarri Abernethy Pager: [phone]       After Hours (After 5p/  First Contact Pager: [phone]  weekends / holidays): Second Contact Pager: [phone]   Chief Complaint: Shortness of Breath  History of Present Illness: Kimberly Lane is a 58 y.o [name] female with HTN, DM, and PVD who presented to the ED with progressive shortness of breath since 8pm yesterday evening. It seemed to be exacerbated when she attempted to lying flat. She also endorses productive cough (white sputum). She states that she has had several events similar to this in the past. She received a medication at [facility] that seemed to help with her SOB but she does not recall the name. In addition to the [facility] she has had chest pain that radiates to the neck and back. She is unable to describe the chest pain. She states that she has had subjective fevers and intermittent diarrhea. She denies nausea, vomiting, and myalgias.   Her interview was conducted by the use of a Optometrist. She has a [name] dialect of Arabic.    Meds:  No current facility-administered medications on file prior to encounter.    Current Outpatient Medications on File Prior to Encounter  Medication Sig Dispense Refill  . acetaminophen (TYLENOL) 500 MG tablet Take 500 mg by mouth 3 (three) times daily as needed for mild pain.    Marland Kitchen acetaminophen (TYLENOL) 650 MG CR tablet Take 650 mg by mouth every 8 (eight) hours as needed for pain.    . Amino Acids-Protein Hydrolys (FEEDING SUPPLEMENT, PRO-STAT SUGAR FREE 64,) LIQD Take 30 mLs by mouth 2 (two) times daily. 900 mL 0  . amLODipine-atorvastatin (CADUET) 10-40 MG tablet Take 1 tablet by  mouth daily.    Marland Kitchen amLODipine-atorvastatin (CADUET) 10-40 MG tablet Take 1 tablet by mouth daily.     Marland Kitchen aspirin 81 MG tablet Take 1 tablet (81 mg total) by mouth daily. 100 tablet 0  . aspirin EC 81 MG tablet Take 81 mg by mouth daily.     . Cholecalciferol (VITAMIN D3) 50 MCG (2000 UT) TABS Take 1 tablet by mouth daily.     . citalopram (CELEXA) 20 MG tablet Take 1.5 tablets (30 mg total) by mouth daily.    . cloNIDine (CATAPRES - DOSED IN MG/24 HR) 0.3 mg/24hr patch Place 0.3 mg onto the skin once a week. For blood pressure    . clopidogrel (PLAVIX) 75 MG tablet Take 1 tablet (75 mg total) by mouth daily with breakfast. 30 tablet 11  . cyclobenzaprine (FLEXERIL) 5 MG tablet Take 1 tablet (5 mg total) by mouth 3 (three) times daily as needed for muscle spasms. 30 tablet 0  . diclofenac sodium (VOLTAREN) 1 % GEL Apply 2 g topically 2 (two) times daily as needed (right chest pain). 100 g 0  . furosemide (LASIX) 20 MG tablet Take 20 mg by mouth every morning. For fluid retention    . gabapentin (NEURONTIN) 100 MG capsule Take 1 capsule (100 mg total) by mouth 3 (three)  times daily.    . insulin aspart (NOVOLOG) 100 UNIT/ML injection Inject 0-20 Units into the skin 3 (three) times daily with meals. 10 mL 11  . insulin aspart (NOVOLOG) 100 UNIT/ML injection Inject 0-9 Units into the skin 3 (three) times daily with meals. 10 mL 11  . Insulin Glargine (BASAGLAR KWIKPEN) 100 UNIT/ML SOPN Inject 15 Units into the skin daily.    . insulin glargine (LANTUS) 100 UNIT/ML injection Inject 0.15 mLs (15 Units total) into the skin at bedtime. 10 mL 11  . metFORMIN (GLUCOPHAGE) 500 MG tablet Take 1 tablet (500 mg total) by mouth 2 (two) times daily with a meal.    . methocarbamol (ROBAXIN) 500 MG tablet Take 1 tablet (500 mg total) by mouth 3 (three) times daily.    . Multiple Vitamin (MULTIVITAMIN WITH MINERALS) TABS tablet Take 1 tablet by mouth daily. 30 tablet 0  . olmesartan-hydrochlorothiazide (BENICAR HCT)  40-25 MG tablet Take 1 tablet by mouth daily. 30 tablet 0  . pantoprazole (PROTONIX) 40 MG tablet Take 1 tablet (40 mg total) by mouth daily.    . polyethylene glycol (MIRALAX / GLYCOLAX) packet Take 17 g by mouth daily as needed for mild constipation. 14 each 0  . potassium chloride SA (K-DUR,KLOR-CON) 20 MEQ tablet Take 2 tablets (40 mEq total) by mouth 3 (three) times daily before meals.    . promethazine (PHENERGAN) 12.5 MG tablet Take 1 tablet (12.5 mg total) by mouth every 6 (six) hours as needed for refractory nausea / vomiting. 15 tablet 0  . senna-docusate (SENOKOT-S) 8.6-50 MG tablet Take 1 tablet by mouth at bedtime.    . sennosides-docusate sodium (SENOKOT-S) 8.6-50 MG tablet Take 1 tablet by mouth at bedtime. For constipation    . spironolactone (ALDACTONE) 25 MG tablet Take 25 mg by mouth daily. Blood pressure      Allergies: Allergies as of 10/10/2018 - Review Complete 10/10/2018  Allergen Reaction Noted  . Bactrim [sulfamethoxazole-trimethoprim] Nausea And Vomiting and Other (See Comments) 05/13/2014  . Beef-derived products  02/28/2018  . Pork-derived products  02/28/2018  . Sulfa antibiotics Nausea And Vomiting 07/05/2018  . Tuberculin ppd Other (See Comments) 03/11/2016  . Tuberculin tests Other (See Comments) 07/05/2018  . Ace inhibitors Cough 01/15/2014   Past Medical History:  Diagnosis Date  . Allergy   . Anemia   . Anxiety   . Cellulitis and abscess of foot 05/13/2014   rt foot  . Diabetes mellitus   . Hypertension   . Positive TB test 2013  . PVD (peripheral vascular disease) ([place])    s/p R BKA and L AKA  . Type 2 diabetes with peripheral circulatory disorder, controlled ([place])    Family History: Denies family history of HTN, CAD, DM, malignancy.  Social History: Typically eats traditional sudanese dishes including breads and gravies. Denies the use of tobacco, EtOH, or other illicit substances.   Review of Systems: A complete ROS was negative except as  per HPI.   Physical Exam: Blood pressure 105/82, pulse (!) 58, temperature 97.8 F (36.6 C), temperature source Oral, resp. rate 16, SpO2 97 %.  General: Well nourished female in no acute distress HENT: Normocephalic, atraumatic, moist mucus membranes, +JVD Pulm: Good air movement with no wheezing, bibasilar crackles were appreciated on auscultation.  CV: RRR, no murmurs, no rubs  Abdomen: Active bowel sounds, soft, non-distended, no tenderness to palpation  Extremities: bilateral above the knee amputations, pulses palpable in the UEs  Skin: Warm and dry  Neuro: Alert and oriented x 3  EKG: personally reviewed my interpretation is sinus rhythm.   CXR: personally reviewed my interpretation is interstitial edema with vascular congestion consistent with CHF.  Assessment & Plan by Problem: Active Problems:   Acute on chronic diastolic heart failure ([place]) Assessment: Kimberly Lane is a 58 y/o female that presents to [facility] with congestive heart failure.  Plan: Congestive Heart Failure:   Kimberly Lane's imaging, exam findings, and history appear to correspond with congestive heart failure. Her xray shows interstitial edema with vascular congestion. Her BNP was 813.2. She has a history of uncontrolled hypertension. Her physical exam findings show JVD and bibasilar crackles on the physical exam further support her xray findings of being volume overloaded. In the presence of an increased salt diet, medication noncompliance, and orthopnea are contributors and indicators of possible congestive heart failure. Her troponins are stable (28->26), and her EKG shows no signs of ST elevation or evidence of a possible MI, but we will continue to monitor her for further complications. Of note, her echo in 2018 showed an LVEF of 60-65%, but we will reorder her echo during her stay.  - BNP: 813  - Ordered Echocardiogram  - Ordered Furosemide 60 mg IV - Continue monitoring   Hypertension:  - Continue Clonidine -  Ordered amlodipine-atorvastatin 10-40 mg tablet QD - Ordered spironolactone 25 mg QD  Diabetes Mellitus:  - Lantus 7 units - SSI  Diet: Heart/Carb modified VTE ppx: Lovenox CODE STATUS: FULL  Dispo: Admit patient to Inpatient with expected length of stay greater than 2 midnights.  Signed: Maudie Mercury MD 10/10/2018, 6:09 PM

## 2018-10-10 NOTE — Progress Notes (Signed)
Patient is alert and oriented with pain in abd pain blood pressure elevated paged MD and waiting for interpreter to assess.

## 2018-10-10 NOTE — Progress Notes (Signed)
Patient was complaining of chest pain of 5/10, on call MD paged and notified. EKG obtained.

## 2018-10-10 NOTE — ED Provider Notes (Signed)
[name] EMERGENCY DEPARTMENT Provider Note   CSN: [id number] Arrival date & time: 10/10/18  1002     History   Chief Complaint Chief Complaint  Patient presents with  . Chest Pain    HPI Kimberly Lane is a 58 y.o. female.     Patient with history of type 2 diabetes, peripheral vascular disease, history of SMA stenosis, poor medication compliance, recovered COVID-19 infection, [facility] patient, bilateral lower extremity amputation --presents to the emergency department today with worsening chest pain and shortness of breath.  Patient states that she feels like she cannot get a good breath.  She reports occasional cough.  She has had subjective fevers.  No nausea, vomiting, or diarrhea.  Also states ongoing abdominal pain which is chronic.  Level 5 caveat due to language barrier.  Recent ED visit or patient had negative VQ scan.  History obtained using telephone interpreter, [name] dialect of Arabic.     Past Medical History:  Diagnosis Date  . Allergy   . Anemia   . Anxiety   . Cellulitis and abscess of foot 05/13/2014   rt foot  . Diabetes mellitus   . Hypertension   . Positive TB test 2013  . PVD (peripheral vascular disease) ([place])    s/p R BKA and L AKA  . Type 2 diabetes with peripheral circulatory disorder, controlled [facility])     Patient Active Problem List   Diagnosis Date Noted  . 2019 novel coronavirus disease (COVID-19) 07/05/2018  . Acute renal failure (ARF) ([place]) 07/05/2018  . Type 2 diabetes with peripheral circulatory disorder, controlled ([place])   . PVD (peripheral vascular disease) ([place])   . AKI (acute kidney injury) ([place])   . Right upper quadrant abdominal pain   . Hypertensive urgency 02/28/2018  . Neuropathy LLE 08/31/2016  . Left hip pain 08/31/2016  . Labile blood pressure   . Labile blood glucose   . Acute blood loss anemia   . Hypokalemia   . Benign essential HTN   . Uncontrolled diabetes mellitus type 2 with  peripheral artery disease ([place])   . Acute diastolic congestive heart failure ([place]) 08/19/2016  . Unilateral AKA, left ([place]) 08/19/2016  . Post-operative pain   . Leukocytosis   . Anxiety state   . S/P AKA (above knee amputation), left ([place]) 08/17/2016  . Acute respiratory failure ([place])   . Dyspnea   . Gangrene due to peripheral vascular disease ([place]) 08/09/2016  . PAD (peripheral artery disease) ([place]) 03/11/2016  . Dysuria 10/24/2014  . Osteomyelitis of left foot ([place])   . Cough   . Hx of right BKA ([place]) 05/26/2014  . S/P BKA (below knee amputation) unilateral, right ([place])   . Abnormal EKG   . Preop cardiovascular exam 05/21/2014  . Cellulitis of toe of right foot   . Peripheral arterial disease ([place])   . Cellulitis 05/13/2014  . Type 2 diabetes mellitus with complication ([place])   . HLD (hyperlipidemia)   . Pure hypercholesterolemia 11/14/2013  . High risk social situation 11/13/2013  . Back pain without radiation 09/05/2011    Class: Acute  . Osteoarthritis of left hip 05/31/2011  . History of primary TB 04/26/2011  . DM (diabetes mellitus), type 2, uncontrolled ([place]) 05/17/2010  . Essential hypertension 12/09/2009  . Muscular chest pain 12/09/2009    Past Surgical History:  Procedure Laterality Date  . ABDOMINAL AORTAGRAM N/A 05/20/2014   Procedure: ABDOMINAL Maxcine Ham;  Surgeon: Serafina Mitchell, MD;  Location: [facility] CATH LAB;  Service: Cardiovascular;  Laterality: N/A;  . AMPUTATION Right 05/23/2014   Procedure: AMPUTATION BELOW KNEE;  Surgeon: Mal Misty, MD;  Location: [facility];  Service: Vascular;  Laterality: Right;  . AMPUTATION Left 08/09/2016   Procedure: LEFT GREAT TOE AMPUTATION;  Surgeon: Waynetta Sandy, MD;  Location: [facility];  Service: Vascular;  Laterality: Left;  . AMPUTATION Left 08/15/2016   Procedure: AMPUTATION ABOVE KNEE;  Surgeon: Rosetta Posner, MD;  Location: [facility];  Service: Vascular;  Laterality: Left;  . PERIPHERAL VASCULAR CATHETERIZATION N/A  03/11/2016   Procedure: Abdominal Aortogram w/Lower Extremity;  Surgeon: Elam Dutch, MD;  Location: [facility] CV LAB;  Service: Cardiovascular;  Laterality: N/A;  . PERIPHERAL VASCULAR CATHETERIZATION Left 03/11/2016   Procedure: Peripheral Vascular Intervention;  Surgeon: Elam Dutch, MD;  Location: [facility] CV LAB;  Service: Cardiovascular;  Laterality: Left;  Superficial femoral     OB History   No obstetric history on file.      Home Medications    Prior to Admission medications   Medication Sig Start Date End Date Taking? Authorizing Provider  acetaminophen (TYLENOL) 500 MG tablet Take 500 mg by mouth 3 (three) times daily as needed for mild pain.    [provider]  acetaminophen (TYLENOL) 650 MG CR tablet Take 650 mg by mouth every 8 (eight) hours as needed for pain.    [provider]  Amino Acids-Protein Hydrolys (FEEDING SUPPLEMENT, PRO-STAT SUGAR FREE 64,) LIQD Take 30 mLs by mouth 2 (two) times daily. 09/01/16   Love, Ivan Anchors, PA-C  amLODipine-atorvastatin (CADUET) 10-40 MG tablet Take 1 tablet by mouth daily.    [provider]  amLODipine-atorvastatin (CADUET) 10-40 MG tablet Take 1 tablet by mouth daily.     [provider]  aspirin 81 MG tablet Take 1 tablet (81 mg total) by mouth daily. 01/20/15   Veatrice Bourbon, MD  aspirin EC 81 MG tablet Take 81 mg by mouth daily.     [provider]  Cholecalciferol (VITAMIN D3) 50 MCG (2000 UT) TABS Take 1 tablet by mouth daily.     [provider]  citalopram (CELEXA) 20 MG tablet Take 1.5 tablets (30 mg total) by mouth daily. 09/01/16   Love, Ivan Anchors, PA-C  cloNIDine (CATAPRES - DOSED IN MG/24 HR) 0.3 mg/24hr patch Place 0.3 mg onto the skin once a week. For blood pressure    [provider]  clopidogrel (PLAVIX) 75 MG tablet Take 1 tablet (75 mg total) by mouth daily with breakfast. 03/12/16   Rhyne, Hulen Shouts, PA-C  cyclobenzaprine (FLEXERIL) 5 MG tablet  Take 1 tablet (5 mg total) by mouth 3 (three) times daily as needed for muscle spasms. 09/01/16   Love, Ivan Anchors, PA-C  diclofenac sodium (VOLTAREN) 1 % GEL Apply 2 g topically 2 (two) times daily as needed (right chest pain). 03/01/18   Carroll Sage, MD  furosemide (LASIX) 20 MG tablet Take 20 mg by mouth every morning. For fluid retention    [provider]  gabapentin (NEURONTIN) 100 MG capsule Take 1 capsule (100 mg total) by mouth 3 (three) times daily. 09/01/16   Love, Ivan Anchors, PA-C  insulin aspart (NOVOLOG) 100 UNIT/ML injection Inject 0-20 Units into the skin 3 (three) times daily with meals. 08/19/16   Ledell Noss, MD  insulin aspart (NOVOLOG) 100 UNIT/ML injection Inject 0-9 Units into the skin 3 (three) times daily with meals. 09/01/16  Love, Pamela S, PA-C  Insulin Glargine (BASAGLAR KWIKPEN) 100 UNIT/ML SOPN Inject 15 Units into the skin daily.    [provider]  insulin glargine (LANTUS) 100 UNIT/ML injection Inject 0.15 mLs (15 Units total) into the skin at bedtime. 09/01/16   Love, Ivan Anchors, PA-C  metFORMIN (GLUCOPHAGE) 500 MG tablet Take 1 tablet (500 mg total) by mouth 2 (two) times daily with a meal. 09/01/16   Love, Ivan Anchors, PA-C  methocarbamol (ROBAXIN) 500 MG tablet Take 1 tablet (500 mg total) by mouth 3 (three) times daily. 09/01/16   Love, Ivan Anchors, PA-C  Multiple Vitamin (MULTIVITAMIN WITH MINERALS) TABS tablet Take 1 tablet by mouth daily. 08/19/16   Ledell Noss, MD  olmesartan-hydrochlorothiazide (BENICAR HCT) 40-25 MG tablet Take 1 tablet by mouth daily. 03/01/18 03/31/18  Carroll Sage, MD  pantoprazole (PROTONIX) 40 MG tablet Take 1 tablet (40 mg total) by mouth daily. 09/02/16   Love, Ivan Anchors, PA-C  polyethylene glycol (MIRALAX / GLYCOLAX) packet Take 17 g by mouth daily as needed for mild constipation. 09/01/16   Love, Ivan Anchors, PA-C  potassium chloride SA (K-DUR,KLOR-CON) 20 MEQ tablet Take 2 tablets (40 mEq total) by mouth 3 (three) times daily before meals.  09/01/16   Love, Ivan Anchors, PA-C  promethazine (PHENERGAN) 12.5 MG tablet Take 1 tablet (12.5 mg total) by mouth every 6 (six) hours as needed for refractory nausea / vomiting. 07/10/18   Patrecia Pour, MD  senna-docusate (SENOKOT-S) 8.6-50 MG tablet Take 1 tablet by mouth at bedtime. 09/01/16   Love, Ivan Anchors, PA-C  sennosides-docusate sodium (SENOKOT-S) 8.6-50 MG tablet Take 1 tablet by mouth at bedtime. For constipation    [provider]  spironolactone (ALDACTONE) 25 MG tablet Take 25 mg by mouth daily. Blood pressure     [provider]    Family History Family History  Problem Relation Age of Onset  . Heart disease Other        No family history    Social History Social History   Tobacco Use  . Smoking status: Never Smoker  . Smokeless tobacco: Never Used  Substance Use Topics  . Alcohol use: No  . Drug use: No     Allergies   Bactrim [sulfamethoxazole-trimethoprim], Ace inhibitors, Beef-derived products, Pork-derived products, Sulfa antibiotics, Tuberculin ppd, Tuberculin tests, and Ace inhibitors   Review of Systems Review of Systems  Constitutional: Negative for fever.  HENT: Negative for rhinorrhea and sore throat.   Eyes: Negative for redness.  Respiratory: Positive for cough and shortness of breath.   Cardiovascular: Negative for chest pain.  Gastrointestinal: Positive for abdominal pain. Negative for diarrhea, nausea and vomiting.  Genitourinary: Negative for dysuria.  Musculoskeletal: Negative for myalgias.  Skin: Negative for rash.  Neurological: Negative for headaches.     Physical Exam Updated Vital Signs BP (!) 172/68 (BP Location: Right Arm)   Pulse (!) 57   Temp 97.9 F (36.6 C) (Oral)   Resp 20   SpO2 95%   Physical Exam Vitals signs and nursing note reviewed.  Constitutional:      Appearance: She is well-developed.  HENT:     Head: Normocephalic and atraumatic.  Eyes:     General:        Right eye: No discharge.         Left eye: No discharge.     Conjunctiva/sclera: Conjunctivae normal.  Neck:     Musculoskeletal: Normal range of motion and neck supple.  Cardiovascular:  Rate and Rhythm: Normal rate and regular rhythm.     Heart sounds: Normal heart sounds.  Pulmonary:     Effort: Pulmonary effort is normal.     Breath sounds: Examination of the right-lower field reveals rales. Examination of the left-lower field reveals rales. Rales present. No decreased breath sounds.  Abdominal:     Palpations: Abdomen is soft.     Tenderness: There is abdominal tenderness.     Comments: Mild generalized tenderness.  Musculoskeletal:     Comments: Previous bilateral lower extremity amputation  Skin:    General: Skin is warm and dry.  Neurological:     Mental Status: She is alert.      ED Treatments / Results  Labs (all labs ordered are listed, but only abnormal results are displayed) Labs Reviewed  CBC WITH DIFFERENTIAL/PLATELET - Abnormal; Notable for the following components:      Result Value   Hemoglobin 9.9 (*)    HCT 31.3 (*)    MCH 25.4 (*)    All other components within normal limits  COMPREHENSIVE METABOLIC PANEL - Abnormal; Notable for the following components:   CO2 21 (*)    Glucose, Bld 301 (*)    BUN 35 (*)    Creatinine, Ser 3.06 (*)    Calcium 8.7 (*)    Albumin 2.9 (*)    GFR calc non Af Amer 16 (*)    GFR calc Af Amer 18 (*)    All other components within normal limits  BRAIN NATRIURETIC PEPTIDE - Abnormal; Notable for the following components:   B Natriuretic Peptide 813.2 (*)    All other components within normal limits  CBG MONITORING, ED - Abnormal; Notable for the following components:   Glucose-Capillary 273 (*)    All other components within normal limits  SARS CORONAVIRUS 2 (HOSPITAL ORDER, [facility])  LIPASE, BLOOD  LACTIC ACID, PLASMA  TROPONIN I (HIGH SENSITIVITY)  TROPONIN I (HIGH SENSITIVITY)    ED ECG REPORT   Date:  10/10/2018  Rate: 59  Rhythm: normal sinus rhythm  QRS Axis: normal  Intervals: normal  ST/T Wave abnormalities: nonspecific T wave changes  Conduction Disutrbances:none  Narrative Interpretation:   Old EKG Reviewed: unchanged except slower now  I have personally reviewed the EKG tracing and agree with the computerized printout as noted.  Radiology Dg Chest Portable 1 View  Result Date: 10/10/2018 CLINICAL DATA:  Shortness of breath and chest pain EXAM: PORTABLE CHEST 1 VIEW COMPARISON:  09/25/2018 FINDINGS: Cardiac shadow is mildly prominent but stable. Increased vascular congestion and interstitial edema is noted consistent with CHF. No focal infiltrate is noted. IMPRESSION: Changes consistent with CHF. Electronically Signed   By: Inez Catalina M.D.   On: 10/10/2018 11:14    Procedures Procedures (including critical care time)  Medications Ordered in ED Medications  furosemide (LASIX) injection 20 mg (has no administration in time range)     Initial Impression / Assessment and Plan / ED Course  I have reviewed the triage vital signs and the nursing notes.  Pertinent labs & imaging results that were available during my care of the patient were reviewed by me and considered in my medical decision making (see chart for details).        Patient seen and examined. Work-up initiated.  There was difficulty initially finding a appropriate interpreter for the patient.  I also spoke with Dr. Jimmye Norman of [facility] who gave additional background information.  She is  in agreement with admission for optimization of heart failure. Her number is [phone].   Vital signs reviewed and are as follows: BP (!) 175/65   Pulse (!) 56   Temp 97.9 F (36.6 C) (Oral)   Resp (!) 21   SpO2 92%   Patient has increasing BNP, signs of CHF on chest x-ray, rales on her exam.  Given her social situation and gradually worsening creatinine, feel that inpatient admission for gentle diuresis is indicated.   Will discuss with internal medicine teaching service who admits for [facility].    Continuing to await lactic acid.  At this point, low concern for worsening abdominal pain due to ischemia.  Her main complaint today seems to be more about her breathing status.  1:10 PM Spoke with IMTS who will see patient here.   Final Clinical Impressions(s) / ED Diagnoses   Final diagnoses:  Acute on chronic congestive heart failure, unspecified heart failure type ([place])  Acute renal failure superimposed on chronic kidney disease, unspecified CKD stage, unspecified acute renal failure type ([place])   Admit.   ED Discharge Orders    None       Carlisle Cater, Hershal Coria 10/10/18 [place], MD 10/13/18 1540

## 2018-10-11 ENCOUNTER — Inpatient Hospital Stay (HOSPITAL_COMMUNITY): Payer: Medicare (Managed Care)

## 2018-10-11 DIAGNOSIS — R0602 Shortness of breath: Secondary | ICD-10-CM

## 2018-10-11 LAB — CBC
HCT: 30.1 % — ABNORMAL LOW (ref 36.0–46.0)
Hemoglobin: 9.8 g/dL — ABNORMAL LOW (ref 12.0–15.0)
MCH: 25.7 pg — ABNORMAL LOW (ref 26.0–34.0)
MCHC: 32.6 g/dL (ref 30.0–36.0)
MCV: 79 fL — ABNORMAL LOW (ref 80.0–100.0)
Platelets: 308 10*3/uL (ref 150–400)
RBC: 3.81 MIL/uL — ABNORMAL LOW (ref 3.87–5.11)
RDW: 14.3 % (ref 11.5–15.5)
WBC: 6.8 10*3/uL (ref 4.0–10.5)
nRBC: 0 % (ref 0.0–0.2)

## 2018-10-11 LAB — BASIC METABOLIC PANEL
Anion gap: 11 (ref 5–15)
BUN: 35 mg/dL — ABNORMAL HIGH (ref 6–20)
CO2: 24 mmol/L (ref 22–32)
Calcium: 8.8 mg/dL — ABNORMAL LOW (ref 8.9–10.3)
Chloride: 107 mmol/L (ref 98–111)
Creatinine, Ser: 2.97 mg/dL — ABNORMAL HIGH (ref 0.44–1.00)
GFR calc Af Amer: 19 mL/min — ABNORMAL LOW (ref >60)
GFR calc non Af Amer: 17 mL/min — ABNORMAL LOW (ref >60)
Glucose, Bld: 183 mg/dL — ABNORMAL HIGH (ref 70–99)
Potassium: 3.4 mmol/L — ABNORMAL LOW (ref 3.5–5.1)
Sodium: 142 mmol/L (ref 135–145)

## 2018-10-11 LAB — GLUCOSE, CAPILLARY
Glucose-Capillary: 182 mg/dL — ABNORMAL HIGH (ref 70–99)
Glucose-Capillary: 312 mg/dL — ABNORMAL HIGH (ref 70–99)

## 2018-10-11 LAB — MAGNESIUM: Magnesium: 2.1 mg/dL (ref 1.7–2.4)

## 2018-10-11 LAB — ECHOCARDIOGRAM COMPLETE: Weight: 1957.68 oz

## 2018-10-11 MED ORDER — CLONIDINE 0.3 MG/24HR TD PTWK: 0.3000 mg | MEDICATED_PATCH | TRANSDERMAL | 12 refills | Status: DC

## 2018-10-11 MED ORDER — FUROSEMIDE 20 MG PO TABS: 20.0000 mg | ORAL_TABLET | Freq: Every day | ORAL | 0 refills | Status: DC

## 2018-10-11 MED ORDER — FUROSEMIDE 10 MG/ML IJ SOLN
60.0000 mg | Freq: Every day | INTRAMUSCULAR | Status: DC
  Administered 2018-10-11: 10:00:00 60 mg via INTRAVENOUS
  Filled 2018-10-11: qty 6

## 2018-10-11 MED FILL — FUROSEMIDE 20 MG TABS: 20 | 30 days supply | Qty: 30 | Fill #0

## 2018-10-11 MED FILL — cloNIDine 0.3 MG/24HR PTWK: 0.3 | 28 days supply | Qty: 4 | Fill #0

## 2018-10-11 NOTE — Progress Notes (Addendum)
   Subjective:  Kimberly Lane was seen sitting upright at bedside. She states that she slept well throughout the night, despite waking up several times from a cough. She states that her shortness of breath is much improved compared to when she was admitted. She denies any new onset of pain or nausea.  We conversed through an Veterinary surgeon ([name]).    Objective:  Vital signs in last 24 hours: Vitals:   10/10/18 2028 10/10/18 2226 10/11/18 0022 10/11/18 0513  BP: (!) 195/71 (!) 186/75 (!) 160/69 (!) 164/71  Pulse: 71 64 (!) 57 60  Resp: 18 20 18 20   Temp: 98 F (36.7 C) 98.6 F (37 C) 98.4 F (36.9 C) 98.5 F (36.9 C)  TempSrc: Oral Oral Oral Oral  SpO2: 98% 97% 96% 99%  Weight:    55.5 kg   Physical Exam Constitutional:      General: She is not in acute distress.    Appearance: She is not ill-appearing or toxic-appearing.  HENT:     Head: Normocephalic and atraumatic. No Battle's sign.  Neck:     Musculoskeletal: Normal range of motion.     Comments: No JVD distension noted.   Cardiovascular:     Rate and Rhythm: Normal rate and regular rhythm.     Pulses: Normal pulses.     Heart sounds: Normal heart sounds. No murmur. No gallop.   Pulmonary:     Effort: Pulmonary effort is normal.     Breath sounds: Normal breath sounds.  Musculoskeletal:     Comments: AKA bilaterally.   Neurological:     Mental Status: She is alert.   Left AKA right BKA.  Assessment/Plan:  Active Problems:   S/P BKA (below knee amputation) unilateral, right (HCC)   Unilateral AKA, left (HCC)   Uncontrolled diabetes mellitus type 2 with peripheral artery disease (HCC)   Acute on chronic diastolic heart failure (HCC)  Acute on Chronic Diastolic Heart Failure:  - Echocardiogram: Shows LVEF >65%. Also levated left ventricular end-diastolic pressure No evidence of left ventricular regional wall motion abnormalities.. -On exam appears euvolemic, echo suggest possible elevated LVEDP but I do not think  we need to continue to diurese inpatinet. I think this is predominatley driven by elevated blood pressure. We updated PCP-Dr Jimmye Norman, will discharge home with close follow up at [facility], may need PRN lasix but will have [facility] reevaluate.   Hypertension:  - Continue Clonidine patch - Continue amlodipine-atorvastatin 10-40 mg tablet QD - Continue spironolactone 25 mg QD    Uncontrolled diabetes mellitus type 2 with peripheral artery disease ([place]) with hyperglycemia - treated with levemir 7units +SSI    S/P BKA (below knee amputation) unilateral, right (HCC)   Unilateral AKA, left ([place])    Dispo: Discharge home with [facility] services  Kimberly Mercury, MD 10/11/2018, 6:51 AM Pager: [phone]

## 2018-10-11 NOTE — Progress Notes (Signed)
  Echocardiogram 2D Echocardiogram has been performed.  [name] 10/11/2018, 9:18 AM

## 2018-10-11 NOTE — Progress Notes (Signed)
Medications given and assessment completed with the assistance of a [name] interpreter with [employer].

## 2018-10-11 NOTE — Progress Notes (Signed)
Discharge instructions given to patient with use of [name] interpreter from [employer].

## 2018-10-11 NOTE — Progress Notes (Signed)
Student Pharmacist rounding with the IMTS-B1 service observed potential optimizing DVT prophylaxis therapy. The patient is a 58 year old lady on day 2 of hospital admission for acute on chronic heart failure. She currently is not on DVT prophylaxis. I have reviewed the patient's profile and find her creatinine clearance listed as 16.1 mL/min. A recommendation was made to the medical resident to begin Xarelto 10mg  once daily. Xarelto is FDA approved for DVT prophylaxis in the medically ill with a renal function of ?15 mL/min. Given the patient is Muslim, we must stay away from heparin products as they are porcine-derived. Continue to monitor for symptoms of bleeding.  Youlanda Roys, 4th year Stage manager

## 2018-10-22 NOTE — Discharge Summary (Signed)
Name: Kimberly Lane MRN: [id number] DOB: 09/17/1960 58 y.o. PCP: Angelica Pou, MD  Date of Admission: 10/10/2018 10:02 AM Date of Discharge: 10/11/2018 Attending Physician: Joni Reining, MD Discharge Diagnosis: 1. Acute on Chronic Diastolic Heart Failure  Discharge Medications: Allergies as of 10/11/2018      Reactions   Bactrim [sulfamethoxazole-trimethoprim] Nausea And Vomiting, Other (See Comments)   Pt and family verified that pt takes this medication   Beef-derived Products Other (See Comments)   Patient is Muslim- will not eat this    Pork-derived Products Other (See Comments)   Patient is Muslim- will not eat this    Sulfa Antibiotics Nausea And Vomiting   Tuberculin Ppd Other (See Comments)   Reaction unclear??   Tuberculin Tests Other (See Comments)   Reaction unclear??   Ace Inhibitors Cough      Medication List    TAKE these medications   acetaminophen 500 MG tablet Commonly known as: TYLENOL Take 500 mg by mouth 3 (three) times daily as needed (for pain).   amLODipine-atorvastatin 10-40 MG tablet Commonly known as: CADUET Take 1 tablet by mouth daily.   aspirin EC 81 MG tablet Take 81 mg by mouth daily. What changed: Another medication with the same name was removed. Continue taking this medication, and follow the directions you see here.   Basaglar KwikPen 100 UNIT/ML Sopn Inject 20 Units into the skin daily.   cloNIDine 0.3 mg/24hr patch Commonly known as: CATAPRES - Dosed in mg/24 hr Place 1 patch (0.3 mg total) onto the skin once a week. What changed: additional instructions   feeding supplement (GLUCERNA SHAKE) Liqd Take 237 mLs by mouth daily.   furosemide 20 MG tablet Commonly known as: LASIX Take 1 tablet (20 mg total) by mouth daily. What changed: when to take this   olmesartan-hydrochlorothiazide 40-25 MG tablet Commonly known as: BENICAR HCT Take 1 tablet by mouth daily.   senna-docusate 8.6-50 MG tablet Commonly known  as: Senokot-S Take 1 tablet by mouth at bedtime.   spironolactone 25 MG tablet Commonly known as: ALDACTONE Take 25 mg by mouth daily. Blood pressure   Vitamin D3 50 MCG (2000 UT) Tabs Take 2,000 Units by mouth daily.       Disposition and follow-up:   [name] was discharged from [facility] in Stable condition.  At the hospital follow up visit please address:  1.  Education on reduced sodium intake and education on medication compliance.   2.  Labs / imaging needed at time of follow-up: N/A  3.  Pending labs/ test needing follow-up: N/A  Follow-up Appointments: Follow-up Information    Angelica Pou, MD.   Specialty: Internal Medicine Why: Office will follow up Contact information: [place] [id number] [address] Hospital Course by problem list: 1. Acute on Chronic Diastolic Heart Failure:   [name] came to [facility] after experiencing shortness of breath and orthopnea. Her labs and imaging were indicative of acute on chronic diastolic heart failure. She was treated with IV Lasix. She was also given clonidine, amlodipine-atorvastatin, and spironolactone to control her hypertension. She received an echocardiogram which showed a LVEF >65% with elevated left ventricular end-diastolic pressure. We spoke with her PCP, Dr. Jimmye Norman, who related to [place] that [name] is noncompliant with both her diet, high in salt, and medications. [name] was discharged back to her [facility] facility.   Discharge  Vitals:   BP (!) 177/68 (BP Location: Right Arm)   Pulse 61   Temp 98 F (36.7 C) (Oral)   Resp 20   Wt 55.5 kg   SpO2 97%   BMI 22.38 kg/m   Pertinent Labs, Studies, and Procedures:   B Natriuretic Peptide 0.0 - 100.0 pg/mL 813.2High      PORTABLE CHEST 1 VIEW  COMPARISON:  09/25/2018  FINDINGS: Cardiac shadow is mildly prominent but stable. Increased vascular congestion and interstitial edema is noted  consistent with CHF. No focal infiltrate is noted.  IMPRESSION: Changes consistent with CHF.  Echocardiogram:  FINDINGS  Left Ventricle: The left ventricle has hyperdynamic systolic function, with an ejection fraction of >65%. The cavity size was normal. There is no increase in left ventricular wall thickness. Left ventricular diastolic Doppler parameters are consistent  with pseudonormalization. Elevated left ventricular end-diastolic pressure No evidence of left ventricular regional wall motion abnormalities..  Right Ventricle: The right ventricle has normal systolic function. The cavity was normal. There is no increase in right ventricular wall thickness. Right ventricular systolic pressure could not be assessed.  Left Atrium: Left atrial size was mildly dilated.  Right Atrium: Right atrial size was normal in size.  Interatrial Septum: No atrial level shunt detected by color flow Doppler.  Pericardium: There is no evidence of pericardial effusion.  Mitral Valve: The mitral valve is normal in structure. Mitral valve regurgitation is trivial by color flow Doppler.  Tricuspid Valve: The tricuspid valve is normal in structure. Tricuspid valve regurgitation is trivial by color flow Doppler.  Aortic Valve: The aortic valve is tricuspid Moderate sclerosis of the aortic valve. Aortic valve regurgitation was not assessed by color flow Doppler.  Pulmonic Valve: The pulmonic valve was normal in structure. Pulmonic valve regurgitation is not visualized by color flow Doppler.  Aorta: The aorta is normal unless otherwise noted.  Venous: The inferior vena cava measures 1.71 cm, is normal in size with greater than 50% respiratory variability.  Discharge Instructions: Discharge Instructions    (HEART FAILURE PATIENTS) Call MD:  Anytime you have any of the following symptoms: 1) 3 pound weight gain in 24 hours or 5 pounds in 1 week 2) shortness of breath, with or without a dry hacking  cough 3) swelling in the hands, feet or stomach 4) if you have to sleep on extra pillows at night in order to breathe.   Complete by: As directed    Call MD for:  difficulty breathing, headache or visual disturbances   Complete by: As directed    Call MD for:  extreme fatigue   Complete by: As directed    Call MD for:  persistant nausea and vomiting   Complete by: As directed    Diet - low sodium heart healthy   Complete by: As directed    Discharge instructions   Complete by: As directed    Thank you for choosing [facility] for your medical care. During your stay, you were diagnosed with an acute exacerbation of congestive heart failure. While you were in our care, you were treated with furosemide and had an echocardiogram. Please take your medications as directed on the label. If you notice chest pain, shortness of breath, excessive sweating, or worsening of your symptoms, please come back to [facility] to be reevaluated.   Increase activity slowly   Complete by: As directed       Signed: Maudie Mercury, MD 10/22/2018, 5:00 PM   Pager: [phone]

## 2018-12-07 ENCOUNTER — Other Ambulatory Visit: Payer: Self-pay | Admitting: Internal Medicine

## 2018-12-07 ENCOUNTER — Ambulatory Visit
Admission: RE | Admit: 2018-12-07 | Discharge: 2018-12-07 | Disposition: A | Discharge status: Home / Self Care | Payer: PRIVATE HEALTH INSURANCE | Source: Ambulatory Visit | Attending: Internal Medicine | Admitting: Internal Medicine

## 2018-12-07 DIAGNOSIS — R140 Abdominal distension (gaseous): Secondary | ICD-10-CM

## 2018-12-07 DIAGNOSIS — R059 Cough, unspecified: Secondary | ICD-10-CM

## 2018-12-07 DIAGNOSIS — R05 Cough: Secondary | ICD-10-CM

## 2018-12-07 DIAGNOSIS — R109 Unspecified abdominal pain: Secondary | ICD-10-CM

## 2018-12-14 ENCOUNTER — Ambulatory Visit
Admission: RE | Admit: 2018-12-14 | Discharge: 2018-12-14 | Disposition: A | Discharge status: Home / Self Care | Payer: No Typology Code available for payment source | Source: Ambulatory Visit | Attending: Internal Medicine | Admitting: Internal Medicine

## 2018-12-14 DIAGNOSIS — R109 Unspecified abdominal pain: Secondary | ICD-10-CM

## 2018-12-14 DIAGNOSIS — R140 Abdominal distension (gaseous): Secondary | ICD-10-CM

## 2018-12-20 ENCOUNTER — Emergency Department (HOSPITAL_COMMUNITY): Payer: Medicare (Managed Care)

## 2018-12-20 ENCOUNTER — Emergency Department (HOSPITAL_COMMUNITY)
Admission: EM | Admit: 2018-12-20 | Discharge: 2018-12-20 | Disposition: A | Discharge status: Home / Self Care | Payer: Medicare (Managed Care) | Attending: Emergency Medicine | Admitting: Emergency Medicine

## 2018-12-20 DIAGNOSIS — Z79899 Other long term (current) drug therapy: Secondary | ICD-10-CM | POA: Diagnosis not present

## 2018-12-20 DIAGNOSIS — I5033 Acute on chronic diastolic (congestive) heart failure: Secondary | ICD-10-CM | POA: Insufficient documentation

## 2018-12-20 DIAGNOSIS — R0602 Shortness of breath: Secondary | ICD-10-CM | POA: Diagnosis present

## 2018-12-20 DIAGNOSIS — E1159 Type 2 diabetes mellitus with other circulatory complications: Secondary | ICD-10-CM | POA: Insufficient documentation

## 2018-12-20 DIAGNOSIS — Z794 Long term (current) use of insulin: Secondary | ICD-10-CM | POA: Diagnosis not present

## 2018-12-20 DIAGNOSIS — I110 Hypertensive heart disease with heart failure: Secondary | ICD-10-CM | POA: Diagnosis not present

## 2018-12-20 DIAGNOSIS — Z7982 Long term (current) use of aspirin: Secondary | ICD-10-CM | POA: Diagnosis not present

## 2018-12-20 LAB — CBC WITH DIFFERENTIAL/PLATELET
Abs Immature Granulocytes: 0.02 10*3/uL (ref 0.00–0.07)
Basophils Absolute: 0 10*3/uL (ref 0.0–0.1)
Basophils Relative: 0 %
Eosinophils Absolute: 0.1 10*3/uL (ref 0.0–0.5)
Eosinophils Relative: 2 %
HCT: 29.8 % — ABNORMAL LOW (ref 36.0–46.0)
Hemoglobin: 9.2 g/dL — ABNORMAL LOW (ref 12.0–15.0)
Immature Granulocytes: 0 %
Lymphocytes Relative: 38 %
Lymphs Abs: 2.5 10*3/uL (ref 0.7–4.0)
MCH: 25.6 pg — ABNORMAL LOW (ref 26.0–34.0)
MCHC: 30.9 g/dL (ref 30.0–36.0)
MCV: 83 fL (ref 80.0–100.0)
Monocytes Absolute: 0.3 10*3/uL (ref 0.1–1.0)
Monocytes Relative: 5 %
Neutro Abs: 3.7 10*3/uL (ref 1.7–7.7)
Neutrophils Relative %: 55 %
Platelets: 273 10*3/uL (ref 150–400)
RBC: 3.59 MIL/uL — ABNORMAL LOW (ref 3.87–5.11)
RDW: 14.5 % (ref 11.5–15.5)
WBC: 6.7 10*3/uL (ref 4.0–10.5)
nRBC: 0 % (ref 0.0–0.2)

## 2018-12-20 LAB — BASIC METABOLIC PANEL
Anion gap: 14 (ref 5–15)
BUN: 37 mg/dL — ABNORMAL HIGH (ref 6–20)
CO2: 19 mmol/L — ABNORMAL LOW (ref 22–32)
Calcium: 8.6 mg/dL — ABNORMAL LOW (ref 8.9–10.3)
Chloride: 109 mmol/L (ref 98–111)
Creatinine, Ser: 3.78 mg/dL — ABNORMAL HIGH (ref 0.44–1.00)
GFR calc Af Amer: 14 mL/min — ABNORMAL LOW (ref >60)
GFR calc non Af Amer: 12 mL/min — ABNORMAL LOW (ref >60)
Glucose, Bld: 200 mg/dL — ABNORMAL HIGH (ref 70–99)
Potassium: 3.6 mmol/L (ref 3.5–5.1)
Sodium: 142 mmol/L (ref 135–145)

## 2018-12-20 LAB — TROPONIN I (HIGH SENSITIVITY)
Troponin I (High Sensitivity): 32 ng/L — ABNORMAL HIGH (ref <18)
Troponin I (High Sensitivity): 37 ng/L — ABNORMAL HIGH (ref <18)

## 2018-12-20 LAB — BRAIN NATRIURETIC PEPTIDE: B Natriuretic Peptide: 467.4 pg/mL — ABNORMAL HIGH (ref 0.0–100.0)

## 2018-12-20 MED ORDER — FUROSEMIDE 10 MG/ML IJ SOLN
40.0000 mg | Freq: Once | INTRAMUSCULAR | Status: AC
  Administered 2018-12-20: 12:00:00 40 mg via INTRAVENOUS

## 2018-12-20 MED ORDER — ALBUTEROL SULFATE HFA 108 (90 BASE) MCG/ACT IN AERS
8.0000 | INHALATION_SPRAY | Freq: Once | RESPIRATORY_TRACT | Status: AC
  Administered 2018-12-20: 12:00:00 8 via RESPIRATORY_TRACT
  Filled 2018-12-20: qty 6.7

## 2018-12-20 MED ORDER — AEROCHAMBER PLUS FLO-VU LARGE MISC
Status: AC
  Filled 2018-12-20: qty 1

## 2018-12-20 MED ORDER — FUROSEMIDE 10 MG/ML IJ SOLN
60.0000 mg | Freq: Once | INTRAMUSCULAR | Status: DC
  Filled 2018-12-20: qty 6

## 2018-12-20 MED ORDER — AEROCHAMBER PLUS FLO-VU MISC
1.0000 | Freq: Once | Status: AC
  Administered 2018-12-20: 1
  Filled 2018-12-20: qty 1

## 2018-12-20 NOTE — ED Triage Notes (Signed)
Pt is being assisted getting dressed and Husband is coming to pick Pt up. Husband has Pt's WC

## 2018-12-20 NOTE — ED Provider Notes (Signed)
[facility] EMERGENCY DEPARTMENT Provider Note   CSN: [id number] Arrival date & time: 12/20/18  0911   History   Chief Complaint Chief Complaint  Patient presents with  . Shortness of Breath  . Cough    HPI Kimberly Lane is a 58 y.o. female with multiple medical problems who presents with SOB. 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 History is limited due to language barrier. The patient states that she is always SOB but it got worse today. She reports associated intermittent fevers, a severe cough, intermittent chest pain. She is under the care of Dr. Dorian Pod who I personally spoke on the phone with. She states that the patient is unfortunately been declining due to her chronic medical problems. She feels bad on a daily basis but doesn't like to come to the hospital or go to the clinic for check ups. She has poor compliance with her medicines and only takes them when she feels bad. The patient however states she takes all her medicine but doesn't feel like they are the right ones for her because she continues to feel bad. The patient has abundant outpatient resources - she has home health and a social worker checking on her every other day. She has automatic medication dispensers but still cannot take her meds correctly. Dr Jimmye Norman is planning on recommending comfort care for the patient as she does not feel she will live longer than 6 months.  LEVEL 5 CAVEAT   HPI  Past Medical History:  Diagnosis Date  . Allergy   . Anemia   . Anxiety   . Cellulitis and abscess of foot 05/13/2014   rt foot  . Diabetes mellitus   . Hypertension   . Positive TB test 2013  . PVD (peripheral vascular disease) ([place])    s/p R BKA and L AKA  . Type 2 diabetes with peripheral circulatory disorder, controlled [facility])     Patient Active Problem List   Diagnosis Date Noted  . Acute on chronic  diastolic heart failure ([place]) 10/10/2018  . 2019 novel coronavirus disease (COVID-19) 07/05/2018  . Acute renal failure (ARF) ([place]) 07/05/2018  . Type 2 diabetes with peripheral circulatory disorder, controlled ([place])   . PVD (peripheral vascular disease) ([place])   . AKI (acute kidney injury) ([place])   . Right upper quadrant abdominal pain   . Hypertensive urgency 02/28/2018  . Neuropathy LLE 08/31/2016  . Left hip pain 08/31/2016  . Labile blood pressure   . Labile blood glucose   . Acute blood loss anemia   . Hypokalemia   . Benign essential HTN   . Uncontrolled diabetes mellitus type 2 with peripheral artery disease ([place])   . Acute diastolic congestive heart failure ([place]) 08/19/2016  . Unilateral AKA, left ([place]) 08/19/2016  . Post-operative pain   . Leukocytosis   . Anxiety state   . S/P AKA (above knee amputation), left ([place]) 08/17/2016  . Acute respiratory failure ([place])   . Dyspnea   . Gangrene due to peripheral vascular disease ([place]) 08/09/2016  . PAD (peripheral artery disease) ([place]) 03/11/2016  . Dysuria 10/24/2014  . Osteomyelitis of left foot ([place])   . Cough   . Hx of right BKA ([place]) 05/26/2014  . S/P BKA (below knee amputation) unilateral, right ([place])   . Abnormal EKG   . Preop cardiovascular exam 05/21/2014  . Cellulitis of toe of right foot   .  Peripheral arterial disease ([place])   . Cellulitis 05/13/2014  . Type 2 diabetes mellitus with complication ([place])   . HLD (hyperlipidemia)   . Pure hypercholesterolemia 11/14/2013  . High risk social situation 11/13/2013  . Back pain without radiation 09/05/2011    Class: Acute  . Osteoarthritis of left hip 05/31/2011  . History of primary TB 04/26/2011  . DM (diabetes mellitus), type 2, uncontrolled ([place]) 05/17/2010  . Essential hypertension 12/09/2009  . Muscular chest pain 12/09/2009    Past Surgical History:  Procedure Laterality Date  . ABDOMINAL AORTAGRAM N/A 05/20/2014   Procedure: ABDOMINAL Maxcine Ham;  Surgeon:  Serafina Mitchell, MD;  Location: [facility] CATH LAB;  Service: Cardiovascular;  Laterality: N/A;  . AMPUTATION Right 05/23/2014   Procedure: AMPUTATION BELOW KNEE;  Surgeon: Mal Misty, MD;  Location: [facility];  Service: Vascular;  Laterality: Right;  . AMPUTATION Left 08/09/2016   Procedure: LEFT GREAT TOE AMPUTATION;  Surgeon: Waynetta Sandy, MD;  Location: [facility];  Service: Vascular;  Laterality: Left;  . AMPUTATION Left 08/15/2016   Procedure: AMPUTATION ABOVE KNEE;  Surgeon: Rosetta Posner, MD;  Location: [facility];  Service: Vascular;  Laterality: Left;  . PERIPHERAL VASCULAR CATHETERIZATION N/A 03/11/2016   Procedure: Abdominal Aortogram w/Lower Extremity;  Surgeon: Elam Dutch, MD;  Location: [facility] CV LAB;  Service: Cardiovascular;  Laterality: N/A;  . PERIPHERAL VASCULAR CATHETERIZATION Left 03/11/2016   Procedure: Peripheral Vascular Intervention;  Surgeon: Elam Dutch, MD;  Location: [facility] CV LAB;  Service: Cardiovascular;  Laterality: Left;  Superficial femoral     OB History   No obstetric history on file.      Home Medications    Prior to Admission medications   Medication Sig Start Date End Date Taking? Authorizing Provider  acetaminophen (TYLENOL) 500 MG tablet Take 500 mg by mouth 3 (three) times daily as needed (for pain).     [provider]  amLODipine-atorvastatin (CADUET) 10-40 MG tablet Take 1 tablet by mouth daily.     [provider]  aspirin EC 81 MG tablet Take 81 mg by mouth daily.     [provider]  Cholecalciferol (VITAMIN D3) 50 MCG (2000 UT) TABS Take 2,000 Units by mouth daily.     [provider]  cloNIDine (CATAPRES - DOSED IN MG/24 HR) 0.3 mg/24hr patch Place 1 patch (0.3 mg total) onto the skin once a week. 10/17/18   Maudie Mercury, MD  feeding supplement, GLUCERNA SHAKE, (GLUCERNA SHAKE) LIQD Take 237 mLs by mouth daily.    [provider]  furosemide (LASIX) 20 MG tablet Take 1 tablet (20  mg total) by mouth daily. 10/11/18   Maudie Mercury, MD  Insulin Glargine [facility]) 100 UNIT/ML SOPN Inject 20 Units into the skin daily.     [provider]  olmesartan-hydrochlorothiazide (BENICAR HCT) 40-25 MG tablet Take 1 tablet by mouth daily. 03/01/18 10/10/18  Carroll Sage, MD  senna-docusate (SENOKOT-S) 8.6-50 MG tablet Take 1 tablet by mouth at bedtime. 09/01/16   Love, Ivan Anchors, PA-C  spironolactone (ALDACTONE) 25 MG tablet Take 25 mg by mouth daily. Blood pressure     [provider]    Family History Family History  Problem Relation Age of Onset  . Heart disease Other        No family history    Social History Social History   Tobacco Use  . Smoking status: Never Smoker  . Smokeless tobacco: Never Used  Substance Use Topics  . Alcohol use: No  . Drug use: No     Allergies   Bactrim [sulfamethoxazole-trimethoprim], Beef-derived products, Pork-derived products, Sulfa antibiotics, Tuberculin ppd, Tuberculin tests, and Ace inhibitors   Review of Systems Review of Systems  Constitutional: Positive for fever.  Respiratory: Positive for cough and shortness of breath.   Cardiovascular: Positive for chest pain.  All other systems reviewed and are negative.    Physical Exam Updated Vital Signs BP (!) 184/80 (BP Location: Right Arm)   Pulse 80   Temp 98.1 F (36.7 C) (Oral)   Resp (!) 24   SpO2 94%   Physical Exam Vitals signs and nursing note reviewed.  Constitutional:      General: She is not in acute distress.    Appearance: She is well-developed. She is obese. She is ill-appearing (chronically ill).  HENT:     Head: Normocephalic and atraumatic.  Eyes:     General: No scleral icterus.       Right eye: No discharge.        Left eye: No discharge.     Conjunctiva/sclera: Conjunctivae normal.     Pupils: Pupils are equal, round, and reactive to light.  Neck:     Musculoskeletal: Normal range of motion.  Cardiovascular:      Rate and Rhythm: Normal rate.  Pulmonary:     Effort: Pulmonary effort is normal. Tachypnea present. No respiratory distress.     Breath sounds: Rhonchi and rales present.  Abdominal:     General: There is no distension.  Musculoskeletal:     Comments: Bilateral BKA  Skin:    General: Skin is warm and dry.  Neurological:     Mental Status: She is alert and oriented to person, place, and time.  Psychiatric:        Behavior: Behavior normal.      ED Treatments / Results  Labs (all labs ordered are listed, but only abnormal results are displayed) Labs Reviewed  BASIC METABOLIC PANEL - Abnormal; Notable for the following components:      Result Value   CO2 19 (*)    Glucose, Bld 200 (*)    BUN 37 (*)    Creatinine, Ser 3.78 (*)    Calcium 8.6 (*)    GFR calc non Af Amer 12 (*)    GFR calc Af Amer 14 (*)    All other components within normal limits  CBC WITH DIFFERENTIAL/PLATELET - Abnormal; Notable for the following components:   RBC 3.59 (*)    Hemoglobin 9.2 (*)    HCT 29.8 (*)    MCH 25.6 (*)    All other components within normal limits  BRAIN NATRIURETIC PEPTIDE - Abnormal; Notable for the following components:   B Natriuretic Peptide 467.4 (*)    All other components within normal limits  TROPONIN I (HIGH SENSITIVITY) - Abnormal; Notable for the following components:   Troponin I (High Sensitivity) 32 (*)    All other components within normal limits  TROPONIN I (HIGH SENSITIVITY) - Abnormal; Notable for the following components:   Troponin I (High Sensitivity) 37 (*)    All other components within normal limits    EKG None  Radiology Dg Chest Port 1 View  Result Date: 12/20/2018 CLINICAL DATA:  Shortness of breath. EXAM: PORTABLE CHEST 1 VIEW COMPARISON:  No prior. FINDINGS: Cardiomegaly with pulmonary venous congestion bilateral interstitial prominence. Small bilateral pleural effusions. Findings consistent with CHF. Pneumonitis cannot be excluded.  Degenerative change thoracic spine. IMPRESSION: Findings consistent with congestive heart failure with bilateral interstitial edema and small pleural effusions. Electronically Signed   By: Marcello Moores  Register   On: 12/20/2018 10:10    Procedures Procedures (including critical care time)  Medications Ordered in ED Medications  albuterol (VENTOLIN HFA) 108 (90 Base) MCG/ACT inhaler 8 puff (8 puffs Inhalation Given 12/20/18 1141)  aerochamber plus with mask device 1 each (1 each Other Given 12/20/18 1141)  furosemide (LASIX) injection 40 mg (40 mg Intravenous Given 12/20/18 1142)     Initial Impression / Assessment and Plan / ED Course  I have reviewed the triage vital signs and the nursing notes.  Pertinent labs & imaging results that were available during my care of the patient were reviewed by me and considered in my medical decision making (see chart for details).  58 year old female with multiple medical problems presents with SOB. Suspected to be due to medical non-compliance which is well documented and I personally spoke with her PCP who confirms this. She is significantly hypertensive but otherwise vitals are normal. She has rhonchi and rales on exam. Will obtain EKG, labs, CXR and try an inhaler and lasix  CXR shows findings consistent with acute heart failure exacerbation. CBC is remarkable for anemia (9.2) which is slightly worsening and likely due to her chronic kidney disease. Her kidney function is also worsening. Unfortunately, after discussion with her PCP she is not a candidate for dialysis because of her non-compliance. BNP is 467. Trop is 32 and 27 respectively which is likely troponin leak from her CKD. EKG is SR.   On recheck, she feels slightly better. Shared visit with Dr. Roderic Palau. Will d/c home. Her PCP states she will f/u with her either later today or tomorrow.  Final Clinical Impressions(s) / ED Diagnoses   Final diagnoses:  Acute on chronic diastolic congestive heart  failure [facility])    ED Discharge Orders    None       Recardo Evangelist, PA-C 12/21/18 1514    [name] Ferguson, MD 12/22/18 1453

## 2018-12-20 NOTE — ED Triage Notes (Signed)
Per EMS patient presents with shortness of breath and a cough. EMS reports patient was tachypneic at 28 with an SpO2 of 94%. Pt does have rhonchi throughout lung fields according to EMS. Pt arrives 100% on NRB. Reports shortness of breath has been going on for a while but is worse today than normal. Pt reports severe cough. Pt ran out of her inhaler at home. Does report a fever at home.

## 2018-12-20 NOTE — ED Notes (Signed)
Pt also reporting intermittent chest pain.

## 2019-05-06 IMAGING — DX DG CHEST 1V PORT
1 series · 1 of 1 positions shown · non-contrast
Comparison: 08/14/2016

CLINICAL DATA: Shortness of Breath

EXAM:
PORTABLE CHEST 1 VIEW

[chest ap]
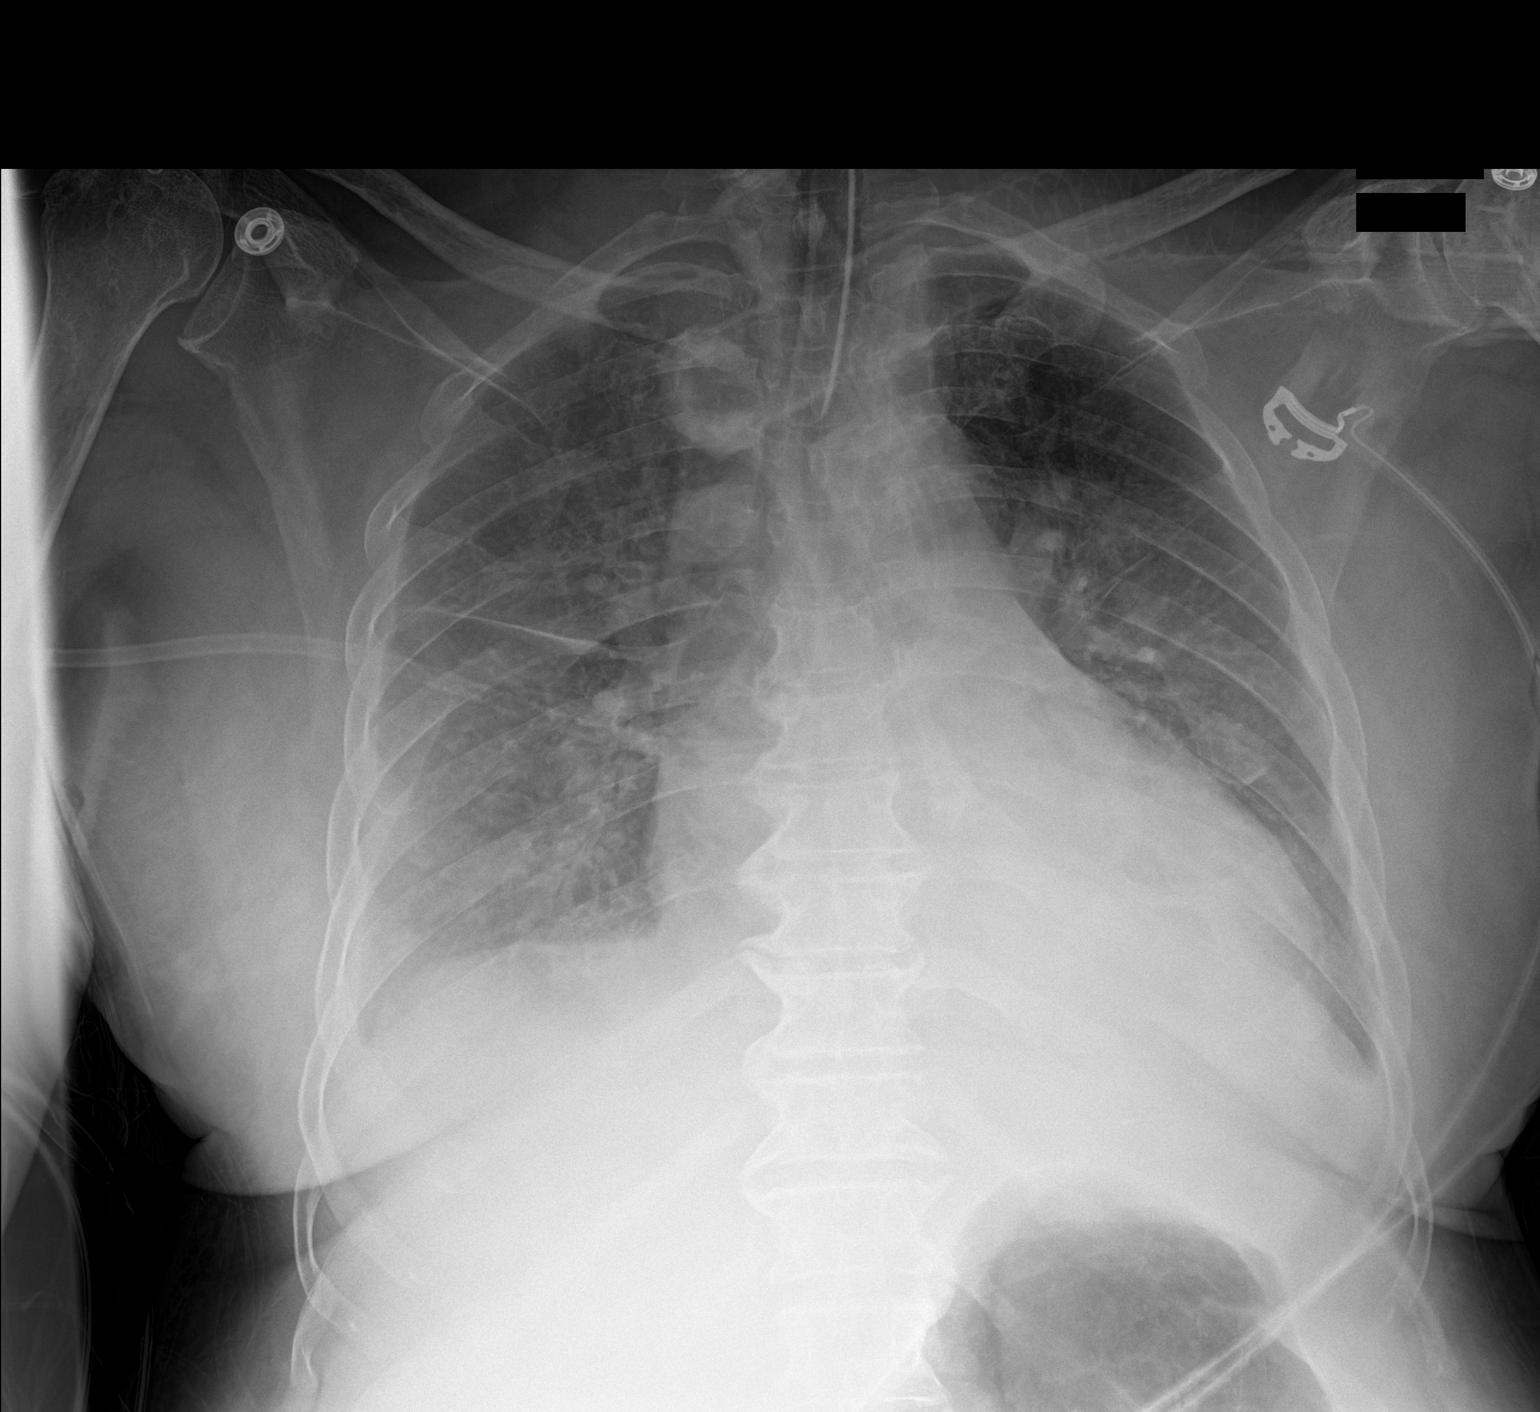

[1 of 1 positions shown; findings below may reference images not displayed]

FINDINGS: Endotracheal tube is 3 cm above the carina. There is cardiomegaly.
Vascular congestion and bilateral opacities are again noted
compatible with mild edema/ CHF. Small bilateral effusions.
IMPRESSION: Continued mild CHF with small effusions.  No real change.

Endotracheal tube 3 cm above the carina.

## 2019-05-08 IMAGING — DX DG CHEST 1V PORT
1 series · 1 of 1 positions shown · non-contrast
Comparison: Portable chest x-ray [DATE]

CLINICAL DATA: Acute respiratory failure ; status post above the
knee amputation on the left on [REDACTED]. History of diabetes and
hypertension.

EXAM:
PORTABLE CHEST 1 VIEW

[chest ap]
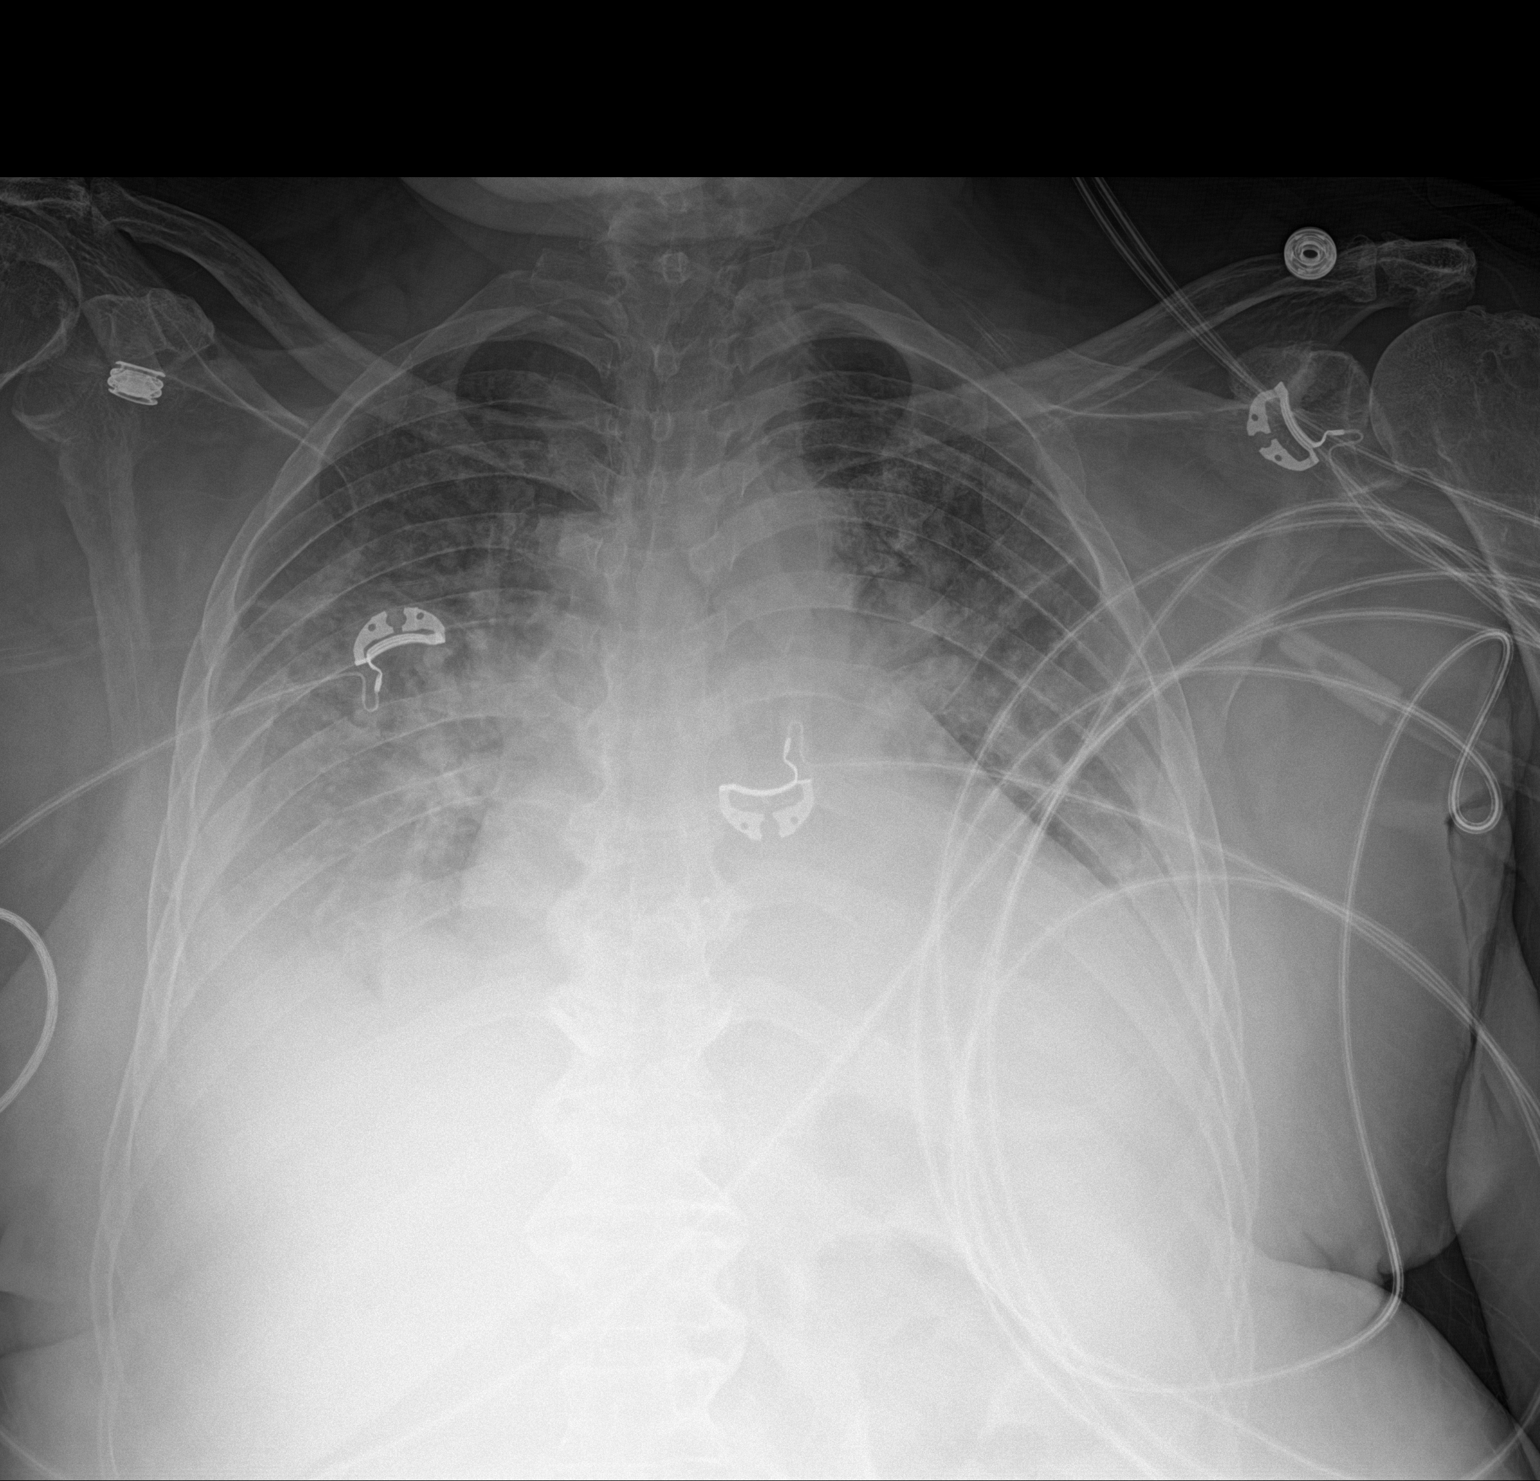

[1 of 1 positions shown; findings below may reference images not displayed]

FINDINGS: The trachea has been extubated. There is mild deviation of the
trachea toward the left. The lungs are mildly hypoinflated. The
pulmonary vascularity is engorged and there is pulmonary
interstitial edema. These findings are more conspicuous today. The
cardiac silhouette remains enlarged. The left retrocardiac region
remains dense. There are small bilateral pleural effusions layering
posteriorly and inferiorly.
IMPRESSION: CHF with pulmonary interstitial edema and bilateral pleural
effusions more conspicuous today. These findings may be accentuated
by interval extubation. Persistent left lower lobe atelectasis or
pneumonia.

Deviation of the trachea toward the left at the base of the neck may
be positional. This will merit attention bone follow-up chest
x-rays. Thyroid ultrasound or neck CT scanning may be indicated.

## 2020-11-18 IMAGING — DX DG CHEST 1V PORT
1 series · 1 of 1 positions shown · non-contrast
Comparison: Lung bases from abdominal CT earlier this day. Chest
radiograph 08/17/2016

CLINICAL DATA: Chest pain.

EXAM:
PORTABLE CHEST 1 VIEW

[chest ap]
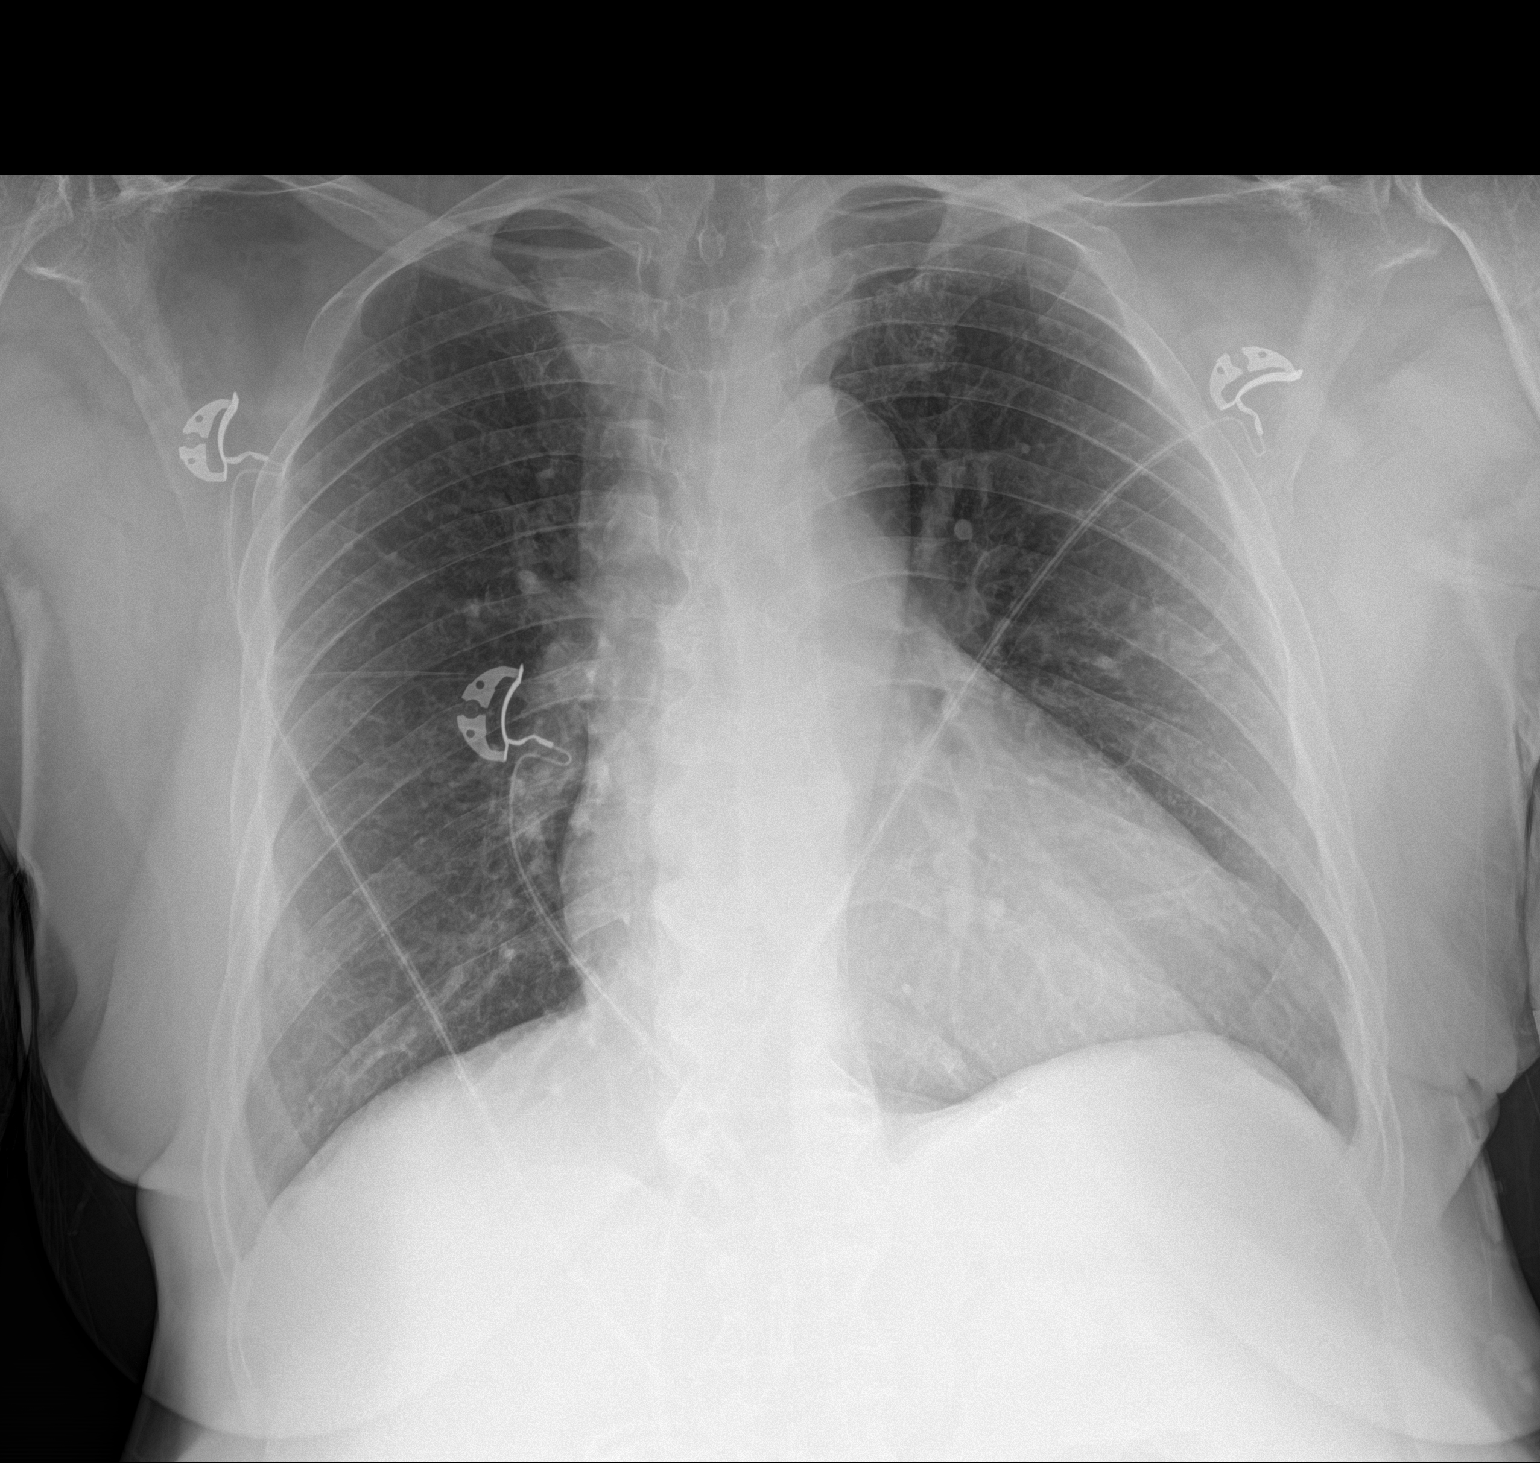

[1 of 1 positions shown; findings below may reference images not displayed]

FINDINGS: Mild cardiomegaly with normal mediastinal contours. Normal pulmonary
vasculature. No focal airspace disease, pleural effusion, or
pneumothorax. No acute osseous abnormalities are seen.
IMPRESSION: Mild cardiomegaly without congestive failure or acute findings.
